# Patient Record
Sex: Female | Born: 1960 | Race: White | Hispanic: No | Marital: Married | Smoking: Current every day smoker
Health system: Southern US, Community
[De-identification: ages and names within clinical notes are randomized; demographics above are authoritative.]

## PROBLEM LIST (undated history)

## (undated) DIAGNOSIS — Z9289 Personal history of other medical treatment: Secondary | ICD-10-CM

## (undated) DIAGNOSIS — F32A Depression, unspecified: Secondary | ICD-10-CM

## (undated) DIAGNOSIS — F1910 Other psychoactive substance abuse, uncomplicated: Secondary | ICD-10-CM

## (undated) DIAGNOSIS — D509 Iron deficiency anemia, unspecified: Secondary | ICD-10-CM

## (undated) DIAGNOSIS — F1020 Alcohol dependence, uncomplicated: Secondary | ICD-10-CM

## (undated) DIAGNOSIS — Z8719 Personal history of other diseases of the digestive system: Secondary | ICD-10-CM

## (undated) DIAGNOSIS — M79606 Pain in leg, unspecified: Secondary | ICD-10-CM

## (undated) DIAGNOSIS — C50919 Malignant neoplasm of unspecified site of unspecified female breast: Secondary | ICD-10-CM

## (undated) DIAGNOSIS — K219 Gastro-esophageal reflux disease without esophagitis: Secondary | ICD-10-CM

## (undated) DIAGNOSIS — D7589 Other specified diseases of blood and blood-forming organs: Secondary | ICD-10-CM

## (undated) DIAGNOSIS — F419 Anxiety disorder, unspecified: Secondary | ICD-10-CM

## (undated) DIAGNOSIS — F329 Major depressive disorder, single episode, unspecified: Secondary | ICD-10-CM

## (undated) DIAGNOSIS — K760 Fatty (change of) liver, not elsewhere classified: Secondary | ICD-10-CM

## (undated) DIAGNOSIS — R51 Headache: Secondary | ICD-10-CM

## (undated) DIAGNOSIS — Z8711 Personal history of peptic ulcer disease: Secondary | ICD-10-CM

## (undated) DIAGNOSIS — M549 Dorsalgia, unspecified: Secondary | ICD-10-CM

## (undated) DIAGNOSIS — R569 Unspecified convulsions: Secondary | ICD-10-CM

## (undated) DIAGNOSIS — R0602 Shortness of breath: Secondary | ICD-10-CM

## (undated) DIAGNOSIS — K7030 Alcoholic cirrhosis of liver without ascites: Secondary | ICD-10-CM

## (undated) DIAGNOSIS — J690 Pneumonitis due to inhalation of food and vomit: Secondary | ICD-10-CM

## (undated) DIAGNOSIS — G8929 Other chronic pain: Secondary | ICD-10-CM

## (undated) DIAGNOSIS — N200 Calculus of kidney: Secondary | ICD-10-CM

## (undated) HISTORY — DX: Malignant neoplasm of unspecified site of unspecified female breast: C50.919

## (undated) HISTORY — DX: Pain in leg, unspecified: M79.606

## (undated) HISTORY — DX: Other chronic pain: G89.29

## (undated) HISTORY — DX: Major depressive disorder, single episode, unspecified: F32.9

## (undated) HISTORY — DX: Other psychoactive substance abuse, uncomplicated: F19.10

## (undated) HISTORY — DX: Depression, unspecified: F32.A

## (undated) HISTORY — PX: ESOPHAGEAL DILATION: SHX303

## (undated) HISTORY — PX: ESOPHAGOGASTRODUODENOSCOPY: SHX1529

## (undated) HISTORY — DX: Dorsalgia, unspecified: M54.9

## (undated) HISTORY — DX: Anxiety disorder, unspecified: F41.9

---

## 1987-08-20 DIAGNOSIS — N200 Calculus of kidney: Secondary | ICD-10-CM

## 1987-08-20 HISTORY — PX: TUBAL LIGATION: SHX77

## 1987-08-20 HISTORY — DX: Calculus of kidney: N20.0

## 2002-01-28 ENCOUNTER — Emergency Department (HOSPITAL_COMMUNITY): Admission: EM | Admit: 2002-01-28 | Discharge: 2002-01-28 | Payer: Self-pay | Admitting: *Deleted

## 2002-04-21 ENCOUNTER — Encounter: Payer: Self-pay | Admitting: Emergency Medicine

## 2002-04-21 ENCOUNTER — Emergency Department (HOSPITAL_COMMUNITY): Admission: EM | Admit: 2002-04-21 | Discharge: 2002-04-21 | Payer: Self-pay | Admitting: Emergency Medicine

## 2002-04-27 ENCOUNTER — Emergency Department (HOSPITAL_COMMUNITY): Admission: EM | Admit: 2002-04-27 | Discharge: 2002-04-27 | Payer: Self-pay | Admitting: Emergency Medicine

## 2002-05-12 ENCOUNTER — Emergency Department (HOSPITAL_COMMUNITY): Admission: EM | Admit: 2002-05-12 | Discharge: 2002-05-12 | Payer: Self-pay | Admitting: Emergency Medicine

## 2002-07-25 ENCOUNTER — Emergency Department (HOSPITAL_COMMUNITY): Admission: EM | Admit: 2002-07-25 | Discharge: 2002-07-26 | Payer: Self-pay | Admitting: Internal Medicine

## 2005-03-02 ENCOUNTER — Emergency Department (HOSPITAL_COMMUNITY): Admission: EM | Admit: 2005-03-02 | Discharge: 2005-03-02 | Payer: Self-pay | Admitting: *Deleted

## 2005-03-20 ENCOUNTER — Ambulatory Visit: Payer: Self-pay | Admitting: Orthopedic Surgery

## 2005-05-09 ENCOUNTER — Ambulatory Visit: Payer: Self-pay | Admitting: Orthopedic Surgery

## 2005-07-01 ENCOUNTER — Ambulatory Visit: Payer: Self-pay | Admitting: Orthopedic Surgery

## 2005-09-18 ENCOUNTER — Emergency Department (HOSPITAL_COMMUNITY): Admission: EM | Admit: 2005-09-18 | Discharge: 2005-09-18 | Payer: Self-pay | Admitting: Emergency Medicine

## 2005-10-03 ENCOUNTER — Inpatient Hospital Stay (HOSPITAL_COMMUNITY): Admission: EM | Admit: 2005-10-03 | Discharge: 2005-10-07 | Payer: Self-pay | Admitting: Emergency Medicine

## 2005-10-04 ENCOUNTER — Ambulatory Visit: Payer: Self-pay | Admitting: Internal Medicine

## 2005-10-04 HISTORY — PX: ESOPHAGOGASTRODUODENOSCOPY (EGD) WITH ESOPHAGEAL DILATION: SHX5812

## 2010-09-08 ENCOUNTER — Encounter: Payer: Self-pay | Admitting: Orthopedic Surgery

## 2010-09-09 ENCOUNTER — Encounter: Payer: Self-pay | Admitting: Internal Medicine

## 2011-01-04 NOTE — Op Note (Signed)
Elizabeth Orr, Elizabeth Orr               ACCOUNT NO.:  [id number]   MEDICAL RECORD NO.:  [id number]          PATIENT TYPE:  INP   LOCATION:  A338                          FACILITY:  [facility]   PHYSICIAN:  R. Roetta Sessions, M.D. DATE OF BIRTH:  04/24/1961   DATE OF PROCEDURE:  10/04/2005  DATE OF DISCHARGE:                                 OPERATIVE REPORT   PROCEDURE:  Esophagogastroduodenoscopy with [name] dilation.   INDICATIONS FOR PROCEDURE:  The patient is a 50 year old Hispanic female  admitted to the hospital with epigastric pain, nausea, vomiting, some  esophageal dysphagia and reflux symptoms. This in the setting of NSAID, ETOH  and cocaine abuse. EGD is now being done. This approach has been discussed  with the patient at length. Potential risks, benefits, and alternatives have  been reviewed and questions answered. She is agreeable. Please see  documentation in the medical record.   PROCEDURE NOTE:  O2 saturation, blood pressure, pulse, and respirations were  monitored throughout the entire procedure. Conscious sedation with Versed 4  mg IV and Demerol 75 mg IV in divided doses. Cetacaine spray for topical  oropharyngeal anesthesia.   INSTRUMENT:  Olympus video chip system.   FINDINGS:  Examination of the tubular esophagus revealed extensive  ulceration of the distal one third of the tubular esophagus in a  geographic distribution. There was some early stricture formation although  the aperture of the esophagus did not appear to be critical at all through  this level. The stricture was an evolution. There was no obvious Barrett's  esophagus or neoplasia. EG junction easily traversed with the scope.   Stomach:  Gastric cavity was empty and insufflated well with air. Thorough  examination of gastric mucosa including retroflexed view of the proximal  stomach and esophagogastric junction revealed multiple prepyloric ulcers, at  least there were four. The largest was 7 mm in  diameter. Ulcers had clean  bases. There was no bleeding stigmata. The remainder of the gastric mucosa  appeared normal. Pylorus patent and easily traversed. Examination of bulb  and second portion revealed a 1 x 1.5 cm bulbar ulcer without bleeding  stigmata. Otherwise, D1 and D2 appeared normal.   THERAPEUTIC/DIAGNOSTIC MANEUVERS:  A 54-F Maloney dilator was passed to full  insertion. A look back revealed the stricture had been dilated without  apparent complication. The patient tolerated the procedure well and was  reactive to endoscopy.   IMPRESSION:  1.  Extensive geographic ulcerations distal third of the tubular esophagus      consistent with severe ulcerative reflux esophagitis, early stricture      formation status post dilation as described above.  2.  Multiple gastric ulcers without bleeding stigmata as described above.      Otherwise normal stomach.  3.  Large bulbar ulcer without bleeding stigmata. Otherwise D1 and D2      appeared normal.   RECOMMENDATIONS:  1.  Agree with b.i.d. protein pump inhibitor therapy. Add Carafate 1 g      slurries q.i.d.  2.  Follow up Helicobacter pylori serologies. Follow up on stool studies.  3.  Avoidance of NSAIDs would be in her best interests.  4.  Further recommendations to follow.   Dr. Cira Servant will be seeing the patient over the weekend.      Jonathon Bellows, M.D.  Electronically Signed     RMR/MEDQ  D:  10/04/2005  T:  10/04/2005  Job:  [id number]

## 2011-01-04 NOTE — Consult Note (Signed)
Elizabeth Orr, Elizabeth Orr               ACCOUNT NO.:  [id number]   MEDICAL RECORD NO.:  [id number]          PATIENT TYPE:  INP   LOCATION:  A338                          FACILITY:  [facility]   PHYSICIAN:  R. Roetta Sessions, M.D. DATE OF BIRTH:  1961/02/04   DATE OF CONSULTATION:  10/04/2005  DATE OF DISCHARGE:                                   CONSULTATION   REFERRING PHYSICIAN:  Margaretmary Dys, M.D.   REASON FOR CONSULTATION:  Abdominal pain.   HISTORY OF PRESENT ILLNESS:  The patient is a 50 year old, English-speaking,  Hispanic female with a 4-week history of intermittent epigastric pain.  She  was evaluated initially on January 31, in the emergency department.  She  states that she had an NG tube placed at that time.  She states that this  seemed to irritate her stomach even more.  She describes going home on  hydrocodone, but nothing for acid reflux or ulcers.  The patient states her  pain has continued to progress.  She complains of severe epigastric pain and  now with 2 days of nausea and vomiting.  She denies any hematemesis.  The  pain is aggravated by meals.  She is drinking only cold water.  She  describes an 8 pound weight loss.  She has had diarrhea x2 days.  She has  noted a small amount of bright red blood in her stool.  She denies any  melena, dysuria or hematuria.  Her period started 3 days ago.  She also  complains of chronic, poorly-controlled acid reflux disease.  She has been  on multiple OTC agents including Mylanta, Pepcid and Zantac most recently.  She complains of dysphagia to solid foods.  She has had near complete food  impactions before.  Generally, she has chronic constipation for which she  takes p.r.n. Milk of Magnesia with good results.  She denies any ill  contacts.  She has a history of significant alcohol abuse.  Generally, she  was drinking at least 80 ounces of beer daily.  She stopped this about 4  weeks when she started feeling bad.  She also smokes  about 1/2 pack of  cigarettes daily and uses crack/cocaine, the most recent time 1 week ago.  Her urine drug screen was positive for benzodiazepines, cocaine and opiates.  Alcohol was less than 5.  The patient complains of fatigue.  She states she  has a lot of stress that drives her to drinking and drug use.  Denies any  prescription drug use, although urine drug screen positive.   MEDICATIONS PRIOR TO ADMISSION:  1.  Motrin 3-4 at a time the last 3 days for abdominal pain.  2.  History of using Goody's powders in the past.  3.  Tylenol p.r.n.  4.  Mylanta p.r.n.  5.  Pepcid p.r.n.  6.  Zantac p.r.n.  7.  Milk of Magnesia p.r.n.   ALLERGIES:  IBUPROFEN has caused facial swelling in the past.   PAST MEDICAL HISTORY:  1.  Polysubstance abuse.  2.  Depression/anxiety.  3.  History of kidney stone requiring stent several  years ago.  4.  She has never had an upper or lower endoscopy.  5.  Bilateral tubal ligation.   FAMILY HISTORY:  Mother has diabetes.  She states that most of her female  relatives including brothers and uncles have either died from cirrhosis or  currently have cirrhosis due to alcohol consumption.  Denies any family  history of colorectal cancer.   SOCIAL HISTORY:  She has a very supportive husband.  They have been married  28 years.  They have seven children.  All are grown and out of the house.  All of her children, but one, moved to [place] a month ago and she states  this has been very stressful.  She uses crack/cocaine on occasion.  The last  time was 1 week ago.  She drinks 80 ounces of beer on a daily basis up until  1 month ago.  She smokes 1/2 pack of cigarettes daily.   REVIEW OF SYSTEMS:  GASTROINTESTINAL:  See HPI.  CONSTITUTIONAL:  See HPI.  GENITOURINARY:  She has regular menses.  They generally last 5-6 days with 2  days being heavy bleeding.  CARDIOPULMONARY:  No chest pain or shortness of  breath.   PHYSICAL EXAMINATION:  VITAL SIGNS:   Temperature 98.5, pulse 83,  respirations 20, blood pressure 90/62, weight 153.6.  GENERAL:  A pleasant, middle-age, Hispanic female in no acute distress.  At  times of the interview, she does moan with pain.  SKIN:  Warm and dry with no jaundice.  HEENT:  Conjunctivae are pale.  Sclerae nonicteric.  Oropharyngeal mucosa  moist and pink.  No lymphadenopathy or thyromegaly.  CHEST:  Lungs clear to auscultation.  CARDIAC:  Regular rate and rhythm with normal S1, S2 with no murmurs, rubs  or gallops.  ABDOMEN:  Positive bowel sounds appear somewhat distended.  No organomegaly  or masses.  Moderate epigastric tenderness to deep palpation.  No rebound  tenderness or guarding.  No abdominal bruits.  No fluid wave or obvious  ascites.  EXTREMITIES:  No edema.  RECTAL:  External hemorrhoids, nonthrombosed and nonbleeding, somewhat  tender.  Stool is yellow and heme positive.   LABORATORY DATA AND X-RAY FINDINGS:  White count 5400 with 8% eos,  hemoglobin 9.5 yesterday, today 8.7, hematocrit 28.5, MCV 71.2, platelets  320,000.  On January 31, her hemoglobin was 10.2.  TSH is 0.359.  Iron 14,  iron saturation 4%, TIBC 333, ferritin 9.  B12 511.  Folate 13.3.  Urine  drug screen positive for benzodiazepines, opiates and cocaine.  Sodium 138,  potassium 3.2, up from 3, BUN 3, creatinine 0.8, glucose 98.  Total  bilirubin 0.3, Alk phos 51, AST 15, ALT 11, albumin 2.6, lipase 15.   CT reveals small to moderate hiatal hernia, small left renal cyst and small  bilateral ovarian cyst.   IMPRESSION:  The patient is a 50 year old, English-speaking, Hispanic female  with a 4-week history of severe epigastric pain now with 2 days of nausea  and vomiting.  She has poorly-controlled acid reflux symptoms, dysphagia to  solid foods.  This is all in the setting of nonsteroidal anti-inflammatory drug and alcohol abuse.  Concerning is microscopic anemia with heme positive  stools.  Would have to consider peptic  ulcer disease, erosive/ulcerative  reflux esophagitis.  She may even have an esophageal web ring or stricture  given her dysphagia.  She has a 2-day history of diarrhea with unclear  etiology.  This is possibly viral.  RECOMMENDATIONS:  1.  EGD this afternoon.  2.  Continue IV Protonix.  3.  K-Dur 40 mEq now.  4.  Stool studies including cultures, C. difficile, O&P and fecal WBC.  5.  CBC and MET 7 in the morning.   I would like to thank Dr. Margaretmary Dys for allowing [place] to take part in  the care of this patient.      Tana Coast, P.AJonathon Bellows, M.D.  Electronically Signed    LL/MEDQ  D:  10/04/2005  T:  10/04/2005  Job:  [id number]

## 2011-01-04 NOTE — Discharge Summary (Signed)
Elizabeth Orr, Elizabeth Orr               ACCOUNT NO.:  [id number]   MEDICAL RECORD NO.:  [id number]          PATIENT TYPE:  INP   LOCATION:  A338                          FACILITY:  [facility]   PHYSICIAN:  Lonia Blood, M.D.       DATE OF BIRTH:  July 18, 1961   DATE OF ADMISSION:  10/03/2005  DATE OF DISCHARGE:  02/19/2007LH                                 DISCHARGE SUMMARY   DISCHARGE DIAGNOSES:  1.  Erosive esophagitis with multiple ulcerations.  2.  Multiple gastric ulcers.  3.  Helicobacter pylori, positive.  4.  Probable nonsteroidal anti-inflammatory drug use.  5.  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  6.  Abdominal pain.  7.  History of polysubstance abuse.  8.  Hypokalemia, resolved.  9.  Dehydration, resolved.   DISCHARGE MEDICATIONS:  1.  Metronidazole 500 mg by mouth three times a day x2 weeks.  2.  Iron tablets by mouth three times a day.  3.  Omeprazole 20 mg by mouth twice a day x2 weeks.  4.  Amoxicillin 500 mg by mouth three times a day x3 weeks.  5.  Oxycodone 5 mg by mouth every 8 hours as needed for severe pain.  6.  Tylenol 500 mg every 6 hours as needed for pain.   CONDITION ON DISCHARGE:  Good.  She was able to tolerate a full diet.   DIET:  Follow soft diet without any sodas or spicy foods.   FOLLOW UP:  She will follow up with Dr. Felecia Shelling, her new primary care  physician, on October 29, 2005, at 10:30 a.m.  She will also follow up with  Dr. Jena Gauss in 3 months for a possible EGD to insure the healing of the  gastric ulcers.   PROCEDURE:  On October 04, 2005, the patient had upper endoscopy which  showed multiple ulcers in the stomach and the esophagus.  The patient was  seen in consultation by the gastroenterology service.  She will be followed  up by Dr. Jena Gauss for repeat endoscopy to insure healing of her gastric  ulcers.   HISTORY OF PRESENT ILLNESS:  For admission History and Physical, please  refer to the dictated H&P done by Dr.  Sherle Poe.   HOSPITAL COURSE:  Problem 1.  ABDOMINAL PAIN:  Elizabeth Orr was admitted with  recurrent abdominal pain.  She was started on intravenous narcotics and  proton pump inhibitor.  She had an endoscopy on October 04, 2005, by the  gastroenterology service and was found to have multiple esophageal and  gastric ulcers.  She also had Helicobacter pylori test that came back  positive.  At this point in time, the plan of care is to use proton pump  inhibitors twice a day, antibiotics for Helicobacter pylori twice a day and  to follow up as an outpatient for assurance of healing of her ulcer.   Problem 2.  ANEMIA MOST LIKELY SECONDARY TO CHRONIC GASTROINTESTINAL LOSS:  The plan is to use oral iron.  At the time of discharge, the patient's  hemoglobin was 8.2 and follow up CBC should  be done at the hospital followup  visit to insure stability.   Problem 3.  HYPOKALEMIA:  Upon admission, the patient was found to be  hypokalemic.  At the time of discharge, her potassium was corrected after  repeat doses and she had no nausea, vomiting or diarrhea at the time of  discharge.      Lonia Blood, M.D.  Electronically Signed     SL/MEDQ  D:  10/07/2005  T:  10/08/2005  Job:  [id number]   cc:   R. Roetta Sessions, M.D.  [address]  [place]  [place] [address]   Tesfaye D. Felecia Shelling, MD  Fax: [phone]

## 2011-01-04 NOTE — H&P (Signed)
Elizabeth Orr, Elizabeth Orr               ACCOUNT NO.:  [id number]   MEDICAL RECORD NO.:  [id number]          PATIENT TYPE:  INP   LOCATION:  A338                          FACILITY:  [facility]   PHYSICIAN:  Margaretmary Dys, M.D.DATE OF BIRTH:  03/29/1961   DATE OF ADMISSION:  10/03/2005  DATE OF DISCHARGE:  LH                                HISTORY & PHYSICAL   PRIMARY CARE PHYSICIAN:  Unassigned.   ADMISSION DIAGNOSIS:  1.  Acute on chronic abdominal/epigastric pain.  2.  Dehydration.  3.  Hypokalemia.  4.  Anemia, microcytic, hypochromic.  5.  Polysubstance abuse.  6.  Probable peptic ulcer disease versus severe gastroesophageal reflux      disease.   CHIEF COMPLAINT:  Epigastric pain of one day duration.   HISTORY OF PRESENT ILLNESS:  Elizabeth Orr is a 50 year old Hispanic female who  presented to the emergency room with complaints of severe epigastric pain.  The patient rated the pain as a 7-8/10 initially but progressed to 10/10.  Was mostly located in the epigastric area.  Was sharp to burning in  sensation and radiated into her back.  The patient reported that she has  been having the symptoms over the past one month on and off and actually in  the elevated on the September 18, 2005 when she was evaluated and discharged  home.  She had some hypokalemia at that time.  The patient reports having  trouble with some of her meals, especially the spicy meals.  She also  reports that when she drinks beer, she has significant pain.  The patient  says she has used Zantac in the past but never had a formal diagnosis of  gastroesophageal reflux disease or peptic ulcer disease.  The patient gives  a vague description of possible endoscopy performed on her two weeks ago but  I did not have the records here.  She said she the procedure was done in the  emergency room.  When I reviewed her records, what I noticed was that she  did have an NG tube placed by did not see any documentation for  endoscopy  studies.   The patient denies any hemoptysis or serious nausea and vomiting.  She  denies any diarrhea.  No melena, stools.  She does not have any frequency,  urgency or dysuria.   The patient states that she uses drugs such as cocaine when she has the  pain.   Initial evaluation in the emergency room revealed some dehydration,  hypokalemia but her labs were unremarkable other than the anemia.   REVIEW OF SYSTEMS:  Ten-point review of systems otherwise negative except as  mentioned in the history of present illness.   PAST MEDICAL HISTORY:  1.  Abdominal pain of one month duration.  2.  History of depression.   MEDICATIONS:  The patient has not taken any medications at this time.   ALLERGIES:  She reports no known drug allergies.  IBUPROFEN occasionally  causes swelling.   FAMILY HISTORY:  Negative for hypertension, diabetes or coronary artery  disease.  No significant illnesses in siblings.  SOCIAL HISTORY:  The patient is married.  Has seven children, all grown up.  She smokes about half a pack of cigarettes a day and has been smoking for  about 30 years.  She also drinks alcohol, about 6-10 ounces a day.  She  drinks mostly beer.  The patient says she had not been drinking for about a  month.  She uses crack cocaine and some prescription drugs.   PHYSICAL EXAMINATION:  GENERAL:  Conscious, alert, comfortable, not in acute  distress, well-oriented to time, place and person.  VITAL SIGNS: Blood pressure 139/84.  Pulse 67.  Respirations 20.  Temperature 97.5.  Oxygen saturation 94% on room air.  HEENT:  Normocephalic, atraumatic.  Oral mucous was dry.  No exudates were  noted.  NECK:  Supple with no JVD or lymphadenopathy.  LUNGS:  Clear clinically with good air entry bilaterally.  HEART:  S1 and S2 regular.  No S3, S4, gallops or rubs.  ABDOMEN:  Slightly distended but soft.  The patient may have some ascites.  She had some tenderness in the epigastric area  which was mild at best.  Bowel sounds were positive.  CHEST:  Grossly intact with no focal deficits.   LABORATORY DATA:  Diagnostic data:  White blood cell count was 5.8,  hemoglobin 9.5, hematocrit 31.4, platelet count 366, MCV 70.4, MCHC 30.3.  Eosinophils were 6%.  ESR 8. Reticulocyte count was 2.3%.  Sodium 135,  potassium 3, chloride 101, CO2 27, glucose 99, BUN 4, creatinine 0.8, total  bilirubin 0.7, alk phos 66, AST 18, ALT 13, total protein 6.4, albumin 3.4,  calcium 8.5, lipase 19.  TSH 0.359.  Iron level 14.  TIBC 333, 4%  saturation.  Vitamin B12 511, ferritin level was 9.  HCG was negative.  Urine toxicology shows positive for benzodiazepines, cocaine and opiates.  Alcohol was undetected.  Urinalysis was unremarkable.  Urine microscopy was  negative.  CT scan of the abdomen and pelvis did not reveal any  abnormalities in the liver.  There was a possibility of a simple  cyst on  the left side.   She also had some hiatal hernia.   ASSESSMENT/PLAN:  Elizabeth Orr is a 50 year old Hispanic female presenting  with acute and chronic epigastric pain.  Possible differential includes  severe gastroesophageal reflux disease versus peptic ulcer disease.  The  patient has had symptoms on and off now for about a month.   PLAN:  Admit her.  Would hydrate her because of her hypokalemia and replace  her K,  control her pain medication and treat her nausea.   She will have a GI consult in the morning to further evaluate for possible  endoscopy.  The patient is not sure and I do not have any records of a  questionable endoscopy performed on her in our recent visit about two weeks.  Will check lipase in the morning.  Her symptoms, laboratory studies and  physical examination do not suggest pancreatitis or acute hepatitis of viral  origin.  Will continue to follow her LFTs.  Her anemia is fairly concerning.  It may be related to possible peptic ulcer  disease or occult GI loss.  She does  have evidence of iron-deficiency  anemia.  Prior to starting her on iron, I recommend that she get stool for  occult blood x3 and endoscopy.   Code status.  She is a full code.   Discussed the plan with the patient and her husband and also  advised her on  quitting alcohol, cigarettes and also  polysubstance abuse including cocaine  and marijuana.      Margaretmary Dys, M.D.  Electronically Signed     AM/MEDQ  D:  10/03/2005  T:  10/04/2005  Job:  [id number]

## 2011-01-04 NOTE — Group Therapy Note (Signed)
Elizabeth Orr, Elizabeth Orr               ACCOUNT NO.:  [id number]   MEDICAL RECORD NO.:  [id number]          PATIENT TYPE:  INP   LOCATION:  A338                          FACILITY:  [facility]   PHYSICIAN:  Margaretmary Dys, M.D.DATE OF BIRTH:  11-Sep-1960   DATE OF PROCEDURE:  10/06/2005  DATE OF DISCHARGE:                                   PROGRESS NOTE   PROGRESS NOTE:   SUBJECTIVE:  Patient has had some epigastric pain but feels better.  She  denies any nausea or vomiting.  She is better able to tolerate her fluids  now.  She has no fever or chills.  She denies any bright red blood in her  stools.   OBJECTIVE:  GENERAL:  Patient is alert comfortable in no acute distress.  VITAL SIGNS:  Blood pressure 127/67, pulse 87, respirations 20, Temperature  Max was 99.1 degrees Fahrenheit.  HEENT:  Normocephalic, atraumatic.  Oral mucosa is moist. No exudates.  NECK:  Supple.  No JVD or lymphadenopathy.  LUNGS:  Clear.  Good air movement bilaterally.  HEART:  S1, S2 regular. No murmurs, gallops or rubs.  ABDOMEN:  Soft, nontender, bowel sounds positive.  No masses palpable.  EXTREMITIES:  No edema was noted.  NEUROLOGICAL:  Grossly intact with no focal deficits.   LABORATORY DATA:  Hemoglobin was down to 8.0, hematocrit 26.6, MCV 7.16.  Sodium 137, potassium 3.6, chloride 103, CO2 31, BUN 4, creatinine 0.8,  glucose 106.  Calcium is 8.5.   ASSESSMENT:  1.  Peptic ulcer disease diagnosed by endoscopy performed September 03, 2005.      Patient is currently on proton pump inhibitor and it is recommended that      she will be on this for about 8 to 12 weeks b.i.d.  Patient was advised      to abstain from using nonsteroidals agents and also alcohol and cocaine      use.  2.  Anemia.  H&H is down today. I am a little concerned.  Patient may      require transfusion prior to discharge.  She may still be having occult      blood GI.   I will type and cross and match 2 units of packed red blood cells  and if her  hemoglobin is less than 8 tomorrow, I will transfuse her with two units of  packed red cells.   DISPOSITION:  I suspect that she will be discharged in the next 1-2 days  once we achieve some stability in her H&H.  Wean her off IV fluids at this  time.  Increase p.o. intake as she  improves.      Margaretmary Dys, M.D.  Electronically Signed     AM/MEDQ  D:  10/06/2005  T:  10/06/2005  Job:  [id number]

## 2012-04-19 HISTORY — PX: BREAST BIOPSY: SHX20

## 2012-05-07 ENCOUNTER — Other Ambulatory Visit (HOSPITAL_COMMUNITY): Payer: Self-pay | Admitting: *Deleted

## 2012-05-13 ENCOUNTER — Encounter (HOSPITAL_COMMUNITY): Payer: Self-pay

## 2012-05-20 ENCOUNTER — Ambulatory Visit (HOSPITAL_COMMUNITY)
Admission: RE | Admit: 2012-05-20 | Discharge: 2012-05-20 | Disposition: A | Discharge status: Home / Self Care | Payer: Medicaid Other | Source: Ambulatory Visit | Attending: *Deleted | Admitting: *Deleted

## 2012-05-20 ENCOUNTER — Other Ambulatory Visit (HOSPITAL_COMMUNITY): Payer: Self-pay | Admitting: *Deleted

## 2012-05-20 DIAGNOSIS — N644 Mastodynia: Secondary | ICD-10-CM | POA: Insufficient documentation

## 2012-05-20 DIAGNOSIS — N63 Unspecified lump in unspecified breast: Secondary | ICD-10-CM | POA: Insufficient documentation

## 2012-05-26 ENCOUNTER — Encounter (HOSPITAL_COMMUNITY): Payer: Self-pay | Admitting: Pharmacy Technician

## 2012-05-28 ENCOUNTER — Encounter (HOSPITAL_COMMUNITY): Payer: Self-pay

## 2012-05-28 ENCOUNTER — Encounter (HOSPITAL_COMMUNITY)
Admission: RE | Admit: 2012-05-28 | Discharge: 2012-05-28 | Payer: Medicaid Other | Source: Ambulatory Visit | Attending: General Surgery | Admitting: General Surgery

## 2012-05-28 HISTORY — DX: Calculus of kidney: N20.0

## 2012-05-28 HISTORY — DX: Personal history of other diseases of the digestive system: Z87.19

## 2012-05-28 HISTORY — DX: Personal history of peptic ulcer disease: Z87.11

## 2012-05-28 HISTORY — DX: Gastro-esophageal reflux disease without esophagitis: K21.9

## 2012-05-28 LAB — CBC WITH DIFFERENTIAL/PLATELET
Eosinophils Absolute: 0.5 10*3/uL (ref 0.0–0.7)
HCT: 33.5 % — ABNORMAL LOW (ref 36.0–46.0)
Lymphocytes Relative: 20 % (ref 12–46)
Lymphs Abs: 2.1 10*3/uL (ref 0.7–4.0)
MCH: 30.4 pg (ref 26.0–34.0)
MCV: 92.5 fL (ref 78.0–100.0)
Monocytes Absolute: 0.6 10*3/uL (ref 0.1–1.0)
Monocytes Relative: 6 % (ref 3–12)
Platelets: 306 10*3/uL (ref 150–400)
RBC: 3.62 MIL/uL — ABNORMAL LOW (ref 3.87–5.11)
WBC: 10.7 10*3/uL — ABNORMAL HIGH (ref 4.0–10.5)

## 2012-05-28 LAB — BASIC METABOLIC PANEL
Chloride: 100 mEq/L (ref 96–112)
GFR calc non Af Amer: >90 mL/min (ref >90)
Sodium: 137 mEq/L (ref 135–145)

## 2012-05-28 LAB — SURGICAL PCR SCREEN
MRSA, PCR: NEGATIVE
Staphylococcus aureus: NEGATIVE

## 2012-05-28 NOTE — Patient Instructions (Addendum)
22 Elizabeth Orr  05/28/2012   Your procedure is scheduled on:  Monday, October 14  Report to [facility] at 0730 AM.  Call this number if you have problems the morning of surgery: [phone]   Remember:   Do not eat food:After Midnight.  May have clear liquids:until Midnight .  Clear liquids include soda, tea, black coffee, apple or grape juice, broth.  Take these medicines the morning of surgery with A SIP OF WATER:    Do not wear jewelry, make-up or nail polish.  Do not wear lotions, powders, or perfumes. You may wear deodorant.  Do not shave 48 hours prior to surgery. Men may shave face and neck.  Do not bring valuables to the hospital.  Contacts, dentures or bridgework may not be worn into surgery.  Leave suitcase in the car. After surgery it may be brought to your room.  For patients admitted to the hospital, checkout time is 11:00 AM the day of discharge.   Patients discharged the day of surgery will not be allowed to drive home.  Name and phone number of your driver: Family and/or friend  Special Instructions: Shower using CHG 2 nights before surgery and the night before surgery.  If you shower the day of surgery use CHG.  Use special wash - you have one bottle of CHG for all showers.  You should use approximately 1/3 of the bottle for each shower.   Please read over the following fact sheets that you were given: Pain Booklet, Coughing and Deep Breathing, MRSA Information, Surgical Site Infection Prevention, Anesthesia Post-op Instructions and Care and Recovery After Surgery  PATIENT INSTRUCTIONS POST-ANESTHESIA  IMMEDIATELY FOLLOWING SURGERY:  Do not drive or operate machinery for the first twenty four hours after surgery.  Do not make any important decisions for twenty four hours after surgery or while taking narcotic pain medications or sedatives.  If you develop intractable nausea and vomiting or a severe headache please notify your doctor immediately.  FOLLOW-UP:  Please  make an appointment with your surgeon as instructed. You do not need to follow up with anesthesia unless specifically instructed to do so.  WOUND CARE INSTRUCTIONS (if applicable):  Keep a dry clean dressing on the anesthesia/puncture wound site if there is drainage.  Once the wound has quit draining you may leave it open to air.  Generally you should leave the bandage intact for twenty four hours unless there is drainage.  If the epidural site drains for more than 36-48 hours please call the anesthesia department.  QUESTIONS?:  Please feel free to call your physician or the hospital operator if you have any questions, and they will be happy to assist you.     Breast Biopsy A breast biopsy is a procedure where a sample of breast tissue is removed from your breast. The tissue is examined under a microscope to see if cancerous cells are present. A breast biopsy is done when there is:  Any undiagnosed breast mass (tumor).  Nipple abnormalities, dimpling, crusting, or ulcerations.  Abnormal discharge from the nipple, especially blood.  Redness, swelling, and pain of the breast.  Calcium deposits (calcifications) or abnormalities seen on a mammogram, ultrasound result, or results of magnetic resonance imaging (MRI).  Suspicious changes in the breast seen on your mammogram. If the tumor is found to be cancerous (malignant), a breast biopsy can help to determine what the best treatment is for you. There are many different types of breast biopsies. Talk to your  caregiver about your options and which type is best for you. LET YOUR CAREGIVER KNOW ABOUT:  Allergies to food or medicine.  Medicines taken, including vitamins, herbs, eyedrops, over-the-counter medicines, and creams.  Use of steroids (by mouth or creams).  Previous problems with anesthetics or numbing medicines.  History of bleeding problems or blood clots.  Previous surgery.  Other health problems, including diabetes and kidney  problems.  Any recent colds or infections.  Possibility of pregnancy, if this applies. RISKS AND COMPLICATIONS   Bleeding.  Infection.  Allergy to medicines.  Bruising and swelling of the breast.  Alteration in the shape of the breast.  Not finding the lump or abnormality.  Needing more surgery. BEFORE THE PROCEDURE  Arrange for someone to drive you home after the procedure.  Do not smoke for 2 weeks before the procedure. Stop smoking, if you smoke.  Do not drink alcohol for 24 hours before procedure.  Wear a good support bra to the procedure. PROCEDURE  You may be given a medicine to numb the breast area (local anesthesia) or a medicine to make you sleep (general anesthesia) during the procedure. The following are the different types of biopsies that can be performed.   Fine-needle aspiration A thin needle is attached to a syringe and inserted into the breast lump. Fluid and cells are removed and then looked at under a microscope. If the breast lump cannot be felt, an ultrasound may be used to help locate the lump and place the needle in the correct area.   Core needle biopsy A wide, hollow needle (core needle) is inserted into the breast lump 3 6 times to get tissue samples or cores. The samples are removed. The needle is usually placed in the correct area by using an ultrasound or X-ray.   Stereotactic biopsy X-ray equipment and a computer are used to analyze X-ray pictures of the breast lump. The computer then finds exactly where the core needle needs to be inserted. Tissue samples are removed.   Vacuum-assisted biopsy A small incision (less than  inch) is made in your breast. A biopsy device that includes a hollow needle and vacuum is passed through the incision and into the breast tissue. The vacuum gently draws abnormal breast tissue into the needle to remove it. This type of biopsy removes a larger tissue sample than a regular core needle biopsy. No stitches are  needed, and there is usually little scarring.  Ultrasound-guided core needle biopsy A high frequency ultrasound helps guide the core needle to the area of the mass or abnormality. An incision is made to insert the needle. Tissue samples are removed.  Open biopsy A larger incision is made in the breast. Your caregiver will attempt to remove the whole breast lump or as much as possible. AFTER THE PROCEDURE  You will be taken to the recovery area. If you are doing well and have no problems, you will be allowed to go home.  You may notice bruising on your breast. This is normal.  Your caregiver may apply a pressure dressing on your breast for 24 48 hours. A pressure dressing is a bandage that is wrapped tightly around the chest to stop fluid from collecting underneath tissues.

## 2012-06-01 ENCOUNTER — Encounter (HOSPITAL_COMMUNITY): Payer: Self-pay | Admitting: Anesthesiology

## 2012-06-01 ENCOUNTER — Encounter (HOSPITAL_COMMUNITY)
Admission: RE | Disposition: A | Discharge status: Home / Self Care | Payer: Self-pay | Source: Ambulatory Visit | Attending: General Surgery

## 2012-06-01 ENCOUNTER — Ambulatory Visit (HOSPITAL_COMMUNITY)
Admission: RE | Admit: 2012-06-01 | Discharge: 2012-06-01 | Disposition: A | Discharge status: Home / Self Care | Payer: Medicaid Other | Source: Ambulatory Visit | Attending: General Surgery | Admitting: General Surgery

## 2012-06-01 ENCOUNTER — Ambulatory Visit (HOSPITAL_COMMUNITY): Payer: Medicaid Other | Admitting: Anesthesiology

## 2012-06-01 DIAGNOSIS — C50919 Malignant neoplasm of unspecified site of unspecified female breast: Secondary | ICD-10-CM | POA: Insufficient documentation

## 2012-06-01 DIAGNOSIS — N6310 Unspecified lump in the right breast, unspecified quadrant: Secondary | ICD-10-CM

## 2012-06-01 SURGERY — EXCISION OF BREAST BIOPSY
Anesthesia: General | Laterality: Right | Wound class: Clean

## 2012-06-01 MED ORDER — BUPIVACAINE HCL (PF) 0.5 % IJ SOLN
INTRAMUSCULAR | Status: AC
  Filled 2012-06-01: qty 30

## 2012-06-01 MED ORDER — ONDANSETRON HCL 4 MG/2ML IJ SOLN
4.0000 mg | Freq: Once | INTRAMUSCULAR | Status: AC
  Administered 2012-06-01: 4 mg via INTRAVENOUS

## 2012-06-01 MED ORDER — FENTANYL CITRATE 0.05 MG/ML IJ SOLN
INTRAMUSCULAR | Status: AC
  Filled 2012-06-01: qty 2

## 2012-06-01 MED ORDER — MIDAZOLAM HCL 2 MG/2ML IJ SOLN
1.0000 mg | INTRAMUSCULAR | Status: AC | PRN
  Administered 2012-06-01 (×3): 2 mg via INTRAVENOUS

## 2012-06-01 MED ORDER — ROCURONIUM BROMIDE 50 MG/5ML IV SOLN
INTRAVENOUS | Status: AC
  Filled 2012-06-01: qty 1

## 2012-06-01 MED ORDER — SUCCINYLCHOLINE CHLORIDE 20 MG/ML IJ SOLN
INTRAMUSCULAR | Status: AC
  Filled 2012-06-01: qty 1

## 2012-06-01 MED ORDER — FENTANYL CITRATE 0.05 MG/ML IJ SOLN: 50.0000 ug | Freq: Once | INTRAMUSCULAR | Status: DC

## 2012-06-01 MED ORDER — SUCCINYLCHOLINE CHLORIDE 20 MG/ML IJ SOLN
INTRAMUSCULAR | Status: DC | PRN
  Administered 2012-06-01: 120 mg via INTRAVENOUS

## 2012-06-01 MED ORDER — PROPOFOL 10 MG/ML IV BOLUS
INTRAVENOUS | Status: DC | PRN
  Administered 2012-06-01: 150 mg via INTRAVENOUS

## 2012-06-01 MED ORDER — GLYCOPYRROLATE 0.2 MG/ML IJ SOLN
INTRAMUSCULAR | Status: AC
  Filled 2012-06-01: qty 1

## 2012-06-01 MED ORDER — CEFAZOLIN SODIUM-DEXTROSE 2-3 GM-% IV SOLR
INTRAVENOUS | Status: AC
  Filled 2012-06-01: qty 50

## 2012-06-01 MED ORDER — HYDROCODONE-ACETAMINOPHEN 5-325 MG PO TABS: 1.0000 | ORAL_TABLET | ORAL | Status: DC | PRN

## 2012-06-01 MED ORDER — CEFAZOLIN SODIUM-DEXTROSE 2-3 GM-% IV SOLR
INTRAVENOUS | Status: DC | PRN
  Administered 2012-06-01: 2 g via INTRAVENOUS

## 2012-06-01 MED ORDER — ALBUTEROL SULFATE (5 MG/ML) 0.5% IN NEBU
2.5000 mg | INHALATION_SOLUTION | Freq: Once | RESPIRATORY_TRACT | Status: AC
  Administered 2012-06-01: 2.5 mg via RESPIRATORY_TRACT

## 2012-06-01 MED ORDER — MIDAZOLAM HCL 2 MG/2ML IJ SOLN
INTRAMUSCULAR | Status: AC
  Filled 2012-06-01: qty 2

## 2012-06-01 MED ORDER — LIDOCAINE HCL (PF) 1 % IJ SOLN
INTRAMUSCULAR | Status: AC
  Filled 2012-06-01: qty 5

## 2012-06-01 MED ORDER — PROPOFOL 10 MG/ML IV EMUL
INTRAVENOUS | Status: AC
  Filled 2012-06-01: qty 20

## 2012-06-01 MED ORDER — LIDOCAINE HCL 1 % IJ SOLN
INTRAMUSCULAR | Status: DC | PRN
  Administered 2012-06-01: 50 mg via INTRADERMAL

## 2012-06-01 MED ORDER — LIDOCAINE HCL (PF) 1 % IJ SOLN
INTRAMUSCULAR | Status: AC
  Filled 2012-06-01: qty 30

## 2012-06-01 MED ORDER — SODIUM CHLORIDE 0.9 % IN NEBU
INHALATION_SOLUTION | RESPIRATORY_TRACT | Status: AC
  Filled 2012-06-01: qty 3

## 2012-06-01 MED ORDER — GLYCOPYRROLATE 0.2 MG/ML IJ SOLN
0.2000 mg | Freq: Once | INTRAMUSCULAR | Status: AC
  Administered 2012-06-01: 0.2 mg via INTRAVENOUS

## 2012-06-01 MED ORDER — LACTATED RINGERS IV SOLN
INTRAVENOUS | Status: DC | PRN
  Administered 2012-06-01: 09:00:00 via INTRAVENOUS

## 2012-06-01 MED ORDER — FENTANYL CITRATE 0.05 MG/ML IJ SOLN
INTRAMUSCULAR | Status: DC | PRN
  Administered 2012-06-01 (×5): 50 ug via INTRAVENOUS
  Administered 2012-06-01: 100 ug via INTRAVENOUS

## 2012-06-01 MED ORDER — CEFAZOLIN SODIUM-DEXTROSE 2-3 GM-% IV SOLR: 2.0000 g | INTRAVENOUS | Status: DC

## 2012-06-01 MED ORDER — ONDANSETRON HCL 4 MG/2ML IJ SOLN: 4.0000 mg | Freq: Once | INTRAMUSCULAR | Status: DC | PRN

## 2012-06-01 MED ORDER — ONDANSETRON HCL 4 MG/2ML IJ SOLN
INTRAMUSCULAR | Status: AC
  Filled 2012-06-01: qty 2

## 2012-06-01 MED ORDER — FENTANYL CITRATE 0.05 MG/ML IJ SOLN
25.0000 ug | INTRAMUSCULAR | Status: DC | PRN
  Administered 2012-06-01: 25 ug via INTRAVENOUS
  Administered 2012-06-01 (×2): 50 ug via INTRAVENOUS
  Administered 2012-06-01: 25 ug via INTRAVENOUS

## 2012-06-01 MED ORDER — LACTATED RINGERS IV SOLN
INTRAVENOUS | Status: DC
  Administered 2012-06-01: 09:00:00 via INTRAVENOUS

## 2012-06-01 MED ORDER — ALBUTEROL SULFATE (5 MG/ML) 0.5% IN NEBU
INHALATION_SOLUTION | RESPIRATORY_TRACT | Status: AC
  Filled 2012-06-01: qty 0.5

## 2012-06-01 SURGICAL SUPPLY — 34 items
APL SKNCLS STERI-STRIP NONHPOA (GAUZE/BANDAGES/DRESSINGS) ×2
BAG HAMPER (MISCELLANEOUS) ×1 IMPLANT
BENZOIN TINCTURE PRP APPL 2/3 (GAUZE/BANDAGES/DRESSINGS) ×2 IMPLANT
CLOTH BEACON ORANGE TIMEOUT ST (SAFETY) ×1 IMPLANT
COVER LIGHT HANDLE STERIS (MISCELLANEOUS) ×2 IMPLANT
ELECT REM PT RETURN 9FT ADLT (ELECTROSURGICAL) ×2
ELECTRODE REM PT RTRN 9FT ADLT (ELECTROSURGICAL) IMPLANT
FORMALIN 10 PREFIL 120ML (MISCELLANEOUS) ×1 IMPLANT
GLOVE BIOGEL PI IND STRL 7.0 (GLOVE) IMPLANT
GLOVE BIOGEL PI IND STRL 7.5 (GLOVE) IMPLANT
GLOVE BIOGEL PI INDICATOR 7.0 (GLOVE) ×1
GLOVE BIOGEL PI INDICATOR 7.5 (GLOVE) ×1
GLOVE ECLIPSE 6.5 STRL STRAW (GLOVE) ×1 IMPLANT
GLOVE ECLIPSE 7.0 STRL STRAW (GLOVE) ×1 IMPLANT
GOWN STRL REIN XL XLG (GOWN DISPOSABLE) ×2 IMPLANT
GRASPER ENDO ROTC 5X31 (INSTRUMENTS) ×1 IMPLANT
KIT ROOM TURNOVER APOR (KITS) ×1 IMPLANT
MANIFOLD NEPTUNE II (INSTRUMENTS) ×1 IMPLANT
NDL HYPO 18GX1.5 BLUNT FILL (NEEDLE) IMPLANT
NDL HYPO 25X1 1.5 SAFETY (NEEDLE) IMPLANT
NEEDLE HYPO 18GX1.5 BLUNT FILL (NEEDLE) IMPLANT
NEEDLE HYPO 25X1 1.5 SAFETY (NEEDLE) ×2 IMPLANT
NS IRRIG 1000ML POUR BTL (IV SOLUTION) ×1 IMPLANT
PACK MINOR (CUSTOM PROCEDURE TRAY) ×1 IMPLANT
PAD ARMBOARD 7.5X6 YLW CONV (MISCELLANEOUS) ×1 IMPLANT
SET BASIN LINEN APH (SET/KITS/TRAYS/PACK) ×1 IMPLANT
SPONGE GAUZE 4X4 12PLY (GAUZE/BANDAGES/DRESSINGS) ×2 IMPLANT
STRIP CLOSURE SKIN 1/2X4 (GAUZE/BANDAGES/DRESSINGS) ×2 IMPLANT
SUT MNCRL AB 4-0 PS2 18 (SUTURE) ×1 IMPLANT
SUT VIC AB 3-0 SH 27 (SUTURE) ×2
SUT VIC AB 3-0 SH 27X BRD (SUTURE) IMPLANT
SYR BULB IRRIGATION 50ML (SYRINGE) ×1 IMPLANT
SYR CONTROL 10ML LL (SYRINGE) ×1 IMPLANT
TAPE CLOTH SURG 4X10 WHT LF (GAUZE/BANDAGES/DRESSINGS) ×1 IMPLANT

## 2012-06-01 NOTE — Op Note (Signed)
Patient:  Elizabeth Orr  DOB:  1961/02/16  MRN:  [id number]   Preop Diagnosis:  Right Breast Mass  Postop Diagnosis:  The same  Procedure:  Right excisional breast biopsy  Surgeon:  Dr. Tilford Pillar  Anes:  General endotracheal, 0.5% Sensorcaine plain for local  Indications:  Patient is a 51 year old female who is in to my office after evaluation for a right breast mass was suspicious on a mammogram. Options for biopsy were discussed. Patient understood risk if it's alternatives of ultrasound-guided biopsy versus surgical excisional biopsy versus stereotactic biopsy. She did wish to proceed with an excisional biopsy. She understood the risks benefits and alternatives including but not limited to risk of bleeding, infection, skin dehiscence, and the likely need for additional operations. Patient was consented for the planned procedure.  Procedure note:  Patient was taken to the or was placed in a supine position the or table time the general anesthetic is a Optician, dispensing. Once patient was asleep she was endotracheally intubated by the nurse anesthetist. At this point her right breast and chest wall prepped with DuraPrep solution draped in standard fashion. A periareolar incision was created with a 15 blade scalpel with additional dissection down to subcuticular tissue carried out using electrocautery. Masses easily palpated and I dissected down and circumferentially around the mass with electrocautery. Hemostasis was obtained using electrocautery. Once the mass was completely dissected free circumferentially was sent as a perm specimen to pathology. The wound is irrigated with copious amount of sterile saline. Hemostasis was excellent. At this time I turned my attention to closure.  The deep subcuticular tissue was reapproximated using a 3-0 Vicryl in simple interrupted fashion. The skin edges reapproximated using a running subcuticular suture. The skin was washed dried moist dry towel after the local  anesthetic was instilled. Benzoin is applied around incision. Half-inch are suture placed. The drapes removed patient left come out of general anesthetic was transferred to PACU in stable condition. At the conclusion of the procedure all instrument, sponge, needle counts are correct. Patient tolerated procedure extremely well.  Complications:  None apparent  EBL:  Minimal  Specimen:  Right breast mass

## 2012-06-01 NOTE — Anesthesia Procedure Notes (Signed)
Procedure Name: Intubation Date/Time: 06/01/2012 10:23 AM Performed by: Carolyne Littles, AMY L Pre-anesthesia Checklist: Patient identified, Patient being monitored, Timeout performed, Emergency Drugs available and Suction available Patient Re-evaluated:Patient Re-evaluated prior to inductionOxygen Delivery Method: Circle System Utilized Preoxygenation: Pre-oxygenation with 100% oxygen Intubation Type: IV induction, Rapid sequence and Cricoid Pressure applied Laryngoscope Size: 3 and Miller Grade View: Grade I Tube type: Oral Tube size: 7.0 mm Number of attempts: 1 Airway Equipment and Method: stylet Placement Confirmation: ETT inserted through vocal cords under direct vision,  positive ETCO2 and breath sounds checked- equal and bilateral Secured at: 21 cm Tube secured with: Tape Dental Injury: Teeth and Oropharynx as per pre-operative assessment

## 2012-06-01 NOTE — Interval H&P Note (Signed)
History and Physical Interval Note:  06/01/2012 10:08 AM  Elizabeth Orr  has presented today for surgery, with the diagnosis of Breast Mass  The various methods of treatment have been discussed with the patient and family. After consideration of risks, benefits and other options for treatment, the patient has consented to  Procedure(s) (LRB) with comments: EXCISION OF BREAST BIOPSY (Right) as a surgical intervention .  The patient's history has been reviewed, patient examined, no change in status, stable for surgery.  I have reviewed the patient's chart and labs.  Questions were answered to the patient's satisfaction.     Allura Doepke C

## 2012-06-01 NOTE — H&P (Signed)
NTS SOAP Note  Vital Signs:  Vitals as of: 05/26/2012: Systolic 160: Diastolic 85: Heart Rate 117: Temp 98.34F: Height 35ft 2in: Weight 232Lbs 0 Ounces: Pain Level 9: BMI 42  BMI : 42.43 kg/m2  Subjective: This 51 Years 62 Months old Female presents for of mass in the right breast. Patient noted changes on self breast exam. She was evaluated at the health department and was sent for mammogram and ultrasound. These changes, mammogram patient was referred for surgical evaluation. Patient states she's noted is increase in the size of the mass of the last several months. She didn't denies any skin changes. No erythema. No nipple changes. No nipple discharge. She denies any significant fevers or chills. She has been extremely anxious. She denies any family history. No significant weight changes. No similar lesions in the past.  Review of Symptoms:    anxious    headaches Eyes:unremarkable   Nose/Mouth/Throat:unremarkable Cardiovascular:  unremarkable   Respiratory:unremarkable   Gastrointestinal:  unremarkable   Genitourinary:unremarkable       arthralgias Skin:unremarkable   as per history of present illness Hematolgic/Lymphatic:unremarkable     Allergic/Immunologic:unremarkable     Past Medical History:  Obtained     Past Medical History  Surgical History: tubal ligation Medical Problems: none Psychiatric History: none Allergies: ibuprofen Medications: none   Social History:Obtained  Social History  Preferred Language: Spanish ([place]) Race:  Native Hawaiian or Other Pacific Islander Ethnicity: Not Hispanic / Latino Age: 51 Years 9 Months Marital Status:  M Alcohol: none Recreational drug(s): none   Smoking Status: Never smoker reviewed on 06/01/2012  Family History:Obtained     Family History  Is there a family history [id number]. No family history of breast cancer, ovarian, or uterine cancers. No prostate  cancer history.    Objective Information: General:  Well appearing, well nourished in no distress. anxious Skin:     no rash or prominent lesions Head:Atraumatic; no masses; no abnormalities Eyes:  conjunctiva clear, EOM intact, PERRL Mouth:  Mucous membranes moist, no mucosal lesions. Neck:  Supple without lymphadenopathy.  Heart:  RRR, no murmur Lungs:    CTA bilaterally, no wheezes, rhonchi, rales.  Breathing unlabored.       palpable large mass within the right breast at approximately 12:00 position. Also a small are noted lateral lesionIn the right breast. No nipple retraction. No skin dimpling.  no axillary lymphadenopathy. Abdomen:Soft, NT/ND, no HSM, no masses. Extremities:  No deformities, clubbing, cyanosis, or edema.    mammogram/ultrasound: abnormal mass within the right breast x3. 2 these are in close approximation based on mammogram and ultrasound findings. The third is lateral. Given the findings patient was given a BI-RADS score of 5   Assessment:    Plan: right breast mass. At this time options were discussed at length the patient and family. Less invasive techniques such as stereotactic biopsy or ultrasound-guided biopsy were discussed with with the patient. Patient is adamant that she wishes to be asleep during any procedure.she understands the increase in dizziness of a surgical biopsy but does wish to proceed. She understands the risk of bleeding, infection, as well as the likely need for additional surgeries. She understands as does the family that this is to obtain additional information and is not a definitive therapy should this truly be identified as a this will be scheduled at her earliest convenience as soon as possible.  Patient Education:Alternative treatments to surgery were discussed with patient (and family).  Risks and benefits  of procedure were  fully explained to the patient (and family) who gave informed consent.  Patient/family questions were addressed.  Follow-up:Pending Surgery

## 2012-06-01 NOTE — Transfer of Care (Signed)
  Anesthesia Post-op Note  Patient: Elizabeth Orr  Procedure(s) Performed: Procedure(s) (LRB) with comments: EXCISION OF BREAST BIOPSY (Right)  Patient Location: PACU  Anesthesia Type: General  Level of Consciousness: awake, alert , oriented and patient cooperative  Airway and Oxygen Therapy: Patient Spontanous Breathing and Patient connected to face mask oxygen  Post-op Pain: mild  Post-op Assessment: Post-op Vital signs reviewed, Patient's Cardiovascular Status Stable, Respiratory Function Stable, Patent Airway and No signs of Nausea or vomiting  Post-op Vital Signs: Reviewed and stable  Complications: No apparent anesthesia complications

## 2012-06-01 NOTE — Preoperative (Signed)
Beta Blockers   Reason not to administer Beta Blockers:Not Applicable 

## 2012-06-01 NOTE — Anesthesia Postprocedure Evaluation (Signed)
  Anesthesia Post-op Note  Patient: Elizabeth Orr  Procedure(s) Performed: Procedure(s) (LRB) with comments: EXCISION OF BREAST BIOPSY (Right)  Patient Location: PACU  Anesthesia Type: General  Level of Consciousness: awake, alert , oriented and patient cooperative  Airway and Oxygen Therapy: Patient Spontanous Breathing and Patient connected to face mask oxygen  Post-op Pain: mild  Post-op Assessment: Post-op Vital signs reviewed, Patient's Cardiovascular Status Stable, Respiratory Function Stable, Patent Airway and No signs of Nausea or vomiting  Post-op Vital Signs: Reviewed and stable  Complications: No apparent anesthesia complications  

## 2012-06-01 NOTE — Anesthesia Preprocedure Evaluation (Signed)
Anesthesia Evaluation  Patient identified by MRN, date of birth, ID band Patient awake    Reviewed: Allergy & Precautions, H&P , NPO status , Patient's Chart, lab work & pertinent test results  History of Anesthesia Complications Negative for: history of anesthetic complications  Airway Mallampati: II      Dental  (+) Poor Dentition, Chipped and Missing   Pulmonary shortness of breath,  Sever GERD probable silent aspiration at night...chronic cough    + wheezing      Cardiovascular negative cardio ROS  Rhythm:Regular Rate:Normal     Neuro/Psych    GI/Hepatic GERD-  Poorly Controlled,  Endo/Other    Renal/GU Renal disease (stones)     Musculoskeletal   Abdominal   Peds  Hematology   Anesthesia Other Findings   Reproductive/Obstetrics                           Anesthesia Physical Anesthesia Plan  ASA: III  Anesthesia Plan: General   Post-op Pain Management:    Induction: Intravenous, Rapid sequence and Cricoid pressure planned  Airway Management Planned: Oral ETT  Additional Equipment:   Intra-op Plan:   Post-operative Plan: Extubation in OR  Informed Consent: I have reviewed the patients History and Physical, chart, labs and discussed the procedure including the risks, benefits and alternatives for the proposed anesthesia with the patient or authorized representative who has indicated his/her understanding and acceptance.     Plan Discussed with:   Anesthesia Plan Comments: (preop albuterol jet neb.)        Anesthesia Quick Evaluation

## 2012-06-02 MED ORDER — 0.9 % SODIUM CHLORIDE (POUR BTL) OPTIME
TOPICAL | Status: AC | PRN
  Administered 2012-06-02: 1000 mL

## 2012-06-02 MED ORDER — BUPIVACAINE HCL (PF) 0.5 % IJ SOLN
INTRAMUSCULAR | Status: AC | PRN
  Administered 2012-06-02: 10 mL

## 2012-06-04 ENCOUNTER — Other Ambulatory Visit (HOSPITAL_COMMUNITY): Payer: Self-pay | Admitting: General Surgery

## 2012-06-04 DIAGNOSIS — C50919 Malignant neoplasm of unspecified site of unspecified female breast: Secondary | ICD-10-CM

## 2012-06-05 ENCOUNTER — Encounter (HOSPITAL_COMMUNITY)
Admission: RE | Admit: 2012-06-05 | Discharge: 2012-06-05 | Disposition: A | Discharge status: Home / Self Care | Payer: Medicaid Other | Source: Ambulatory Visit | Attending: General Surgery | Admitting: General Surgery

## 2012-06-05 ENCOUNTER — Encounter (HOSPITAL_COMMUNITY): Payer: Self-pay | Admitting: Pharmacy Technician

## 2012-06-05 ENCOUNTER — Encounter (HOSPITAL_COMMUNITY): Payer: Self-pay

## 2012-06-05 ENCOUNTER — Ambulatory Visit (HOSPITAL_COMMUNITY)
Admission: RE | Admit: 2012-06-05 | Discharge: 2012-06-05 | Disposition: A | Discharge status: Home / Self Care | Payer: Medicaid Other | Source: Ambulatory Visit | Attending: General Surgery | Admitting: General Surgery

## 2012-06-05 LAB — BASIC METABOLIC PANEL
BUN: 4 mg/dL — ABNORMAL LOW (ref 6–23)
CO2: 28 mEq/L (ref 19–32)
Chloride: 94 mEq/L — ABNORMAL LOW (ref 96–112)
Glucose, Bld: 93 mg/dL (ref 70–99)
Potassium: 3.3 mEq/L — ABNORMAL LOW (ref 3.5–5.1)
Sodium: 130 mEq/L — ABNORMAL LOW (ref 135–145)

## 2012-06-05 LAB — HEMOGLOBIN AND HEMATOCRIT, BLOOD: HCT: 37 % (ref 36.0–46.0)

## 2012-06-05 LAB — PREPARE RBC (CROSSMATCH)

## 2012-06-05 NOTE — Patient Instructions (Signed)
22 Elizabeth Orr  06/05/2012   Your procedure is scheduled on:  Monday, 06/08/12  Report to [facility] at 0800 AM.  Call this number if you have problems the morning of surgery: [phone]   Remember:   Do not eat food:After Midnight.  May have clear liquids:until Midnight .  Clear liquids include soda, tea, black coffee, apple or grape juice, broth.  Take these medicines the morning of surgery with A SIP OF WATER: norco if needed   Do not wear jewelry, make-up or nail polish.  Do not wear lotions, powders, or perfumes. You may wear deodorant.  Do not shave 48 hours prior to surgery. Men may shave face and neck.  Do not bring valuables to the hospital.  Contacts, dentures or bridgework may not be worn into surgery.  Leave suitcase in the car. After surgery it may be brought to your room.  For patients admitted to the hospital, checkout time is 11:00 AM the day of discharge.   Patients discharged the day of surgery will not be allowed to drive home.  Name and phone number of your driver: family  Special Instructions: Shower using CHG 2 nights before surgery and the night before surgery.  If you shower the day of surgery use CHG.  Use special wash - you have one bottle of CHG for all showers.  You should use approximately 1/3 of the bottle for each shower.   Please read over the following fact sheets that you were given: Pain Booklet, Coughing and Deep Breathing, Blood Transfusion Information, Lab Information, MRSA Information, Surgical Site Infection Prevention, Anesthesia Post-op Instructions and Care and Recovery After Surgery   Total or Modified Radical Mastectomy  Care After Refer to this sheet in the next few weeks. These instructions provide you with information on caring for yourself after your procedure. Your caregiver may also give you more specific instructions. Your treatment has been planned according to current medical practices, but problems sometimes occur. Call your  caregiver if you have any problems or questions after your procedure. ACTIVITY  Your caregiver will advise you when you may resume strenuous activities, driving, and sports.  After the drain(s) are removed, you may do light housework. Avoid heavy lifting, carrying, or pushing. You should not be lifting anything heavier than 5 lbs.  Take frequent rest periods. You may tire more easily than usual.  Always rest and elevate the arm affected by your surgery for a period of time equal to your activity time.  Continue doing the exercises given to you by the physical therapist/occupational therapist even after full range of motion has returned. The amount of time this takes will vary from person to person.  After normal range of motion has returned, some stiffness and soreness may persist for 2-3 months. This is normal and will subside.  Begin sports or strenuous activities in moderation. This will give you a chance to rebuild your endurance. Continue to be cautious of heavy lifting or carrying (no more than 10 lbs.) with your affected arm.  You may return to work as recommended by your caregiver. NUTRITION  You may resume your normal diet.  Make sure you drink plenty of fluids (6-8 glasses a day).  Eat a well-balanced diet. Including daily portions of food from government recommended food groups:  Grains.  Vegetables.  Fruits.  Milk.  Meat & beans.  Oils. Visit DateT[employer].nl for more information HYGIENE  You may wash your hair.  If your incision (cut from surgery)  is closed, you may shower or tub bathe, unless instructed otherwise by your doctor. FEVER  If you feel feverish or have shaking chills, take your temperature. If your temperature is 102 F (38.9 C) or above, call your caregiver. The fever may mean there is an infection.  If you call early, infection can be treated with antibiotics and hospitalization may be avoided. PAIN CONTROL  Mild discomfort may  occur.  You may need to take an over-the-counter pain medication or a medication prescribed by your caregiver.  Call your caregiver if you experience increased pain. INCISION CARE  Check your incision daily for increased redness, drainage, swelling, or separation of skin.  Call your caregiver if any of the above are noted. ARM AND HAND CARE  If the lymph nodes under your arm were removed with a modified radical mastectomy, there may be a greater tendency for the arm to swell.  Try to avoid having blood pressures taken, blood drawn, or injections given in the affected arm. This is the arm on the same side as the surgery.  Use hand lotion to soften cuticles instead of cutting them to avoid cutting yourself.  Be careful when shaving your under arms. Use an electric shaver if possible. You may use a deodorant after the incision has completely healed. Until then, clean under your arms with hydrogen peroxide.  Use reasonable precaution when cooking, sewing, and gardening to avoid burning or needle or thorn pricks.  Do not weigh your arm straight down with a package or your purse.  Follow the exercises and instructions given to you by the physical therapist/occupational therapist and your caregiver. FOLLOW-UP APPOINTMENT Call your caregiver for a follow-up appointment as directed. PROSTHESIS INFORMATION Wear your temporary prosthesis (artificial breast) until your caregiver gives you permission to purchase a permanent one. This will depend upon your rate of healing. We suggest you also wait until you are physically and emotionally ready to shop for one. The suitability depends on several individual factors. We do not endorse any particular prosthesis, but suggest you try several until you are satisfied with appearance and fit. A list of stores may be obtained from your local [employer] at www.cancer.org or 1-800-ACS-2345 (1-986-640-5015). A permanent prosthesis is medically  necessary to restore balance. It is also income tax deductible. Be sure all receipts are marked "surgical". It is not essential to purchase a bra. You may sew a pocket into your regular bra. Note: Remember to take all of your medical insurance information with you when shopping for your prosthesis. SELECTING A PROSTHESIS FITTER You may want to ask the following questions when selecting a fitter:  What styles and brands of forms are carried in stock?  How long have the forms been on the market and have there been any problems with them?  Why would one form be better than another?  How long should a particular form last?  May I wear the form for a trial period without obligation?  Do the forms require a prosthetic bra? If so, what is the price range? Must I always wear that style?  If alterations to the bra are necessary, can they be done at this location or be sent out?  Will I be charged for alterations?  Will I receive suggestions on how to alter my own wardrobe, if necessary?  Will you special order forms or bras if necessary?  Are fitters always available to meet my needs?  What kinds of garments should be worn for the  fitting?  Are lounge wear, swim wear, and accessories available?  If I have insurance coverage or Medicare, will you suggest ways for processing the paper work?  Do you keep complete records so that mail reordering is possible?  How are warranty claims handled if I have a problem with the form? Document Released: 03/28/2004 Document Revised: 10/28/2011 Document Reviewed: 12/01/2007 [facility] Patient Information 2013 [place], [place].

## 2012-06-08 ENCOUNTER — Encounter (HOSPITAL_COMMUNITY)
Admission: RE | Admit: 2012-06-08 | Discharge: 2012-06-08 | Disposition: A | Discharge status: Home / Self Care | Payer: Medicaid Other | Source: Ambulatory Visit | Attending: General Surgery | Admitting: General Surgery

## 2012-06-08 ENCOUNTER — Inpatient Hospital Stay (HOSPITAL_COMMUNITY)
Admission: RE | Admit: 2012-06-08 | Discharge: 2012-06-10 | DRG: 581 | Disposition: A | Discharge status: Home Health | Payer: Medicaid Other | Source: Ambulatory Visit | Attending: General Surgery | Admitting: General Surgery

## 2012-06-08 ENCOUNTER — Encounter (HOSPITAL_COMMUNITY): Payer: Self-pay | Admitting: *Deleted

## 2012-06-08 ENCOUNTER — Encounter (HOSPITAL_COMMUNITY): Payer: Self-pay

## 2012-06-08 ENCOUNTER — Encounter (HOSPITAL_COMMUNITY): Payer: Self-pay | Admitting: Anesthesiology

## 2012-06-08 ENCOUNTER — Encounter (HOSPITAL_COMMUNITY)
Admission: RE | Disposition: A | Discharge status: Home Health | Payer: Self-pay | Source: Ambulatory Visit | Attending: General Surgery

## 2012-06-08 ENCOUNTER — Ambulatory Visit (HOSPITAL_COMMUNITY): Payer: Medicaid Other | Admitting: Anesthesiology

## 2012-06-08 DIAGNOSIS — C50919 Malignant neoplasm of unspecified site of unspecified female breast: Principal | ICD-10-CM | POA: Diagnosis present

## 2012-06-08 HISTORY — DX: Malignant neoplasm of unspecified site of unspecified female breast: C50.919

## 2012-06-08 HISTORY — PX: MASTECTOMY: SHX3

## 2012-06-08 LAB — CBC
Hemoglobin: 9.9 g/dL — ABNORMAL LOW (ref 12.0–15.0)
MCH: 26.8 pg (ref 26.0–34.0)
MCHC: 29.4 g/dL — ABNORMAL LOW (ref 30.0–36.0)
Platelets: 366 10*3/uL (ref 150–400)
RDW: 19.4 % — ABNORMAL HIGH (ref 11.5–15.5)

## 2012-06-08 LAB — CREATININE, SERUM
Creatinine, Ser: 0.52 mg/dL (ref 0.50–1.10)
GFR calc Af Amer: >90 mL/min (ref >90)
GFR calc non Af Amer: >90 mL/min (ref >90)

## 2012-06-08 SURGERY — MASTECTOMY, MODIFIED RADICAL
Anesthesia: General | Site: Breast | Laterality: Right | Wound class: Clean

## 2012-06-08 MED ORDER — PROPOFOL 10 MG/ML IV BOLUS
INTRAVENOUS | Status: DC | PRN
  Administered 2012-06-08: 150 mg via INTRAVENOUS

## 2012-06-08 MED ORDER — FENTANYL CITRATE 0.05 MG/ML IJ SOLN
INTRAMUSCULAR | Status: AC
  Filled 2012-06-08: qty 5

## 2012-06-08 MED ORDER — SODIUM CHLORIDE 0.9 % IR SOLN
Status: DC | PRN
  Administered 2012-06-08: 1000 mL

## 2012-06-08 MED ORDER — NEOSTIGMINE METHYLSULFATE 1 MG/ML IJ SOLN
INTRAMUSCULAR | Status: DC | PRN
  Administered 2012-06-08: 3 mg via INTRAVENOUS

## 2012-06-08 MED ORDER — FENTANYL CITRATE 0.05 MG/ML IJ SOLN
INTRAMUSCULAR | Status: DC | PRN
  Administered 2012-06-08 (×6): 50 ug via INTRAVENOUS

## 2012-06-08 MED ORDER — ALPRAZOLAM 1 MG PO TABS
1.0000 mg | ORAL_TABLET | Freq: Once | ORAL | Status: AC
  Administered 2012-06-08: 1 mg via ORAL
  Filled 2012-06-08: qty 1

## 2012-06-08 MED ORDER — ROCURONIUM BROMIDE 100 MG/10ML IV SOLN
INTRAVENOUS | Status: DC | PRN
  Administered 2012-06-08: 10 mg via INTRAVENOUS
  Administered 2012-06-08: 5 mg via INTRAVENOUS
  Administered 2012-06-08: 20 mg via INTRAVENOUS

## 2012-06-08 MED ORDER — FENTANYL CITRATE 0.05 MG/ML IJ SOLN
INTRAMUSCULAR | Status: AC
  Filled 2012-06-08: qty 2

## 2012-06-08 MED ORDER — HYDROCODONE-ACETAMINOPHEN 5-325 MG PO TABS
1.0000 | ORAL_TABLET | ORAL | Status: DC | PRN
  Administered 2012-06-08 – 2012-06-10 (×11): 2 via ORAL
  Filled 2012-06-08 (×11): qty 2

## 2012-06-08 MED ORDER — DIPHENHYDRAMINE HCL 50 MG/ML IJ SOLN
INTRAMUSCULAR | Status: DC | PRN
  Administered 2012-06-08: 25 mg via INTRAVENOUS

## 2012-06-08 MED ORDER — ONDANSETRON HCL 4 MG/2ML IJ SOLN
INTRAMUSCULAR | Status: DC | PRN
  Administered 2012-06-08: 4 mg via INTRAVENOUS

## 2012-06-08 MED ORDER — FENTANYL CITRATE 0.05 MG/ML IJ SOLN
25.0000 ug | Freq: Once | INTRAMUSCULAR | Status: AC
  Administered 2012-06-08: 50 ug via INTRAVENOUS

## 2012-06-08 MED ORDER — FENTANYL CITRATE 0.05 MG/ML IJ SOLN
25.0000 ug | INTRAMUSCULAR | Status: DC | PRN
  Administered 2012-06-08 (×3): 50 ug via INTRAVENOUS

## 2012-06-08 MED ORDER — CEFAZOLIN SODIUM-DEXTROSE 2-3 GM-% IV SOLR
2.0000 g | INTRAVENOUS | Status: AC
  Administered 2012-06-08: 2 g via INTRAVENOUS

## 2012-06-08 MED ORDER — SODIUM CHLORIDE 0.9 % IJ SOLN
INTRAMUSCULAR | Status: AC
  Administered 2012-06-08: 3 mL
  Filled 2012-06-08: qty 3

## 2012-06-08 MED ORDER — LIDOCAINE HCL 1 % IJ SOLN
INTRAMUSCULAR | Status: DC | PRN
  Administered 2012-06-08 (×2): 50 mg via INTRADERMAL

## 2012-06-08 MED ORDER — CEFAZOLIN SODIUM-DEXTROSE 2-3 GM-% IV SOLR
INTRAVENOUS | Status: AC
  Filled 2012-06-08: qty 50

## 2012-06-08 MED ORDER — ONDANSETRON HCL 4 MG/2ML IJ SOLN
INTRAMUSCULAR | Status: AC
  Filled 2012-06-08: qty 2

## 2012-06-08 MED ORDER — HYDROMORPHONE HCL PF 1 MG/ML IJ SOLN
1.0000 mg | INTRAMUSCULAR | Status: DC | PRN
  Administered 2012-06-08 (×2): 1 mg via INTRAVENOUS
  Filled 2012-06-08 (×2): qty 1

## 2012-06-08 MED ORDER — GLYCOPYRROLATE 0.2 MG/ML IJ SOLN
INTRAMUSCULAR | Status: AC
  Filled 2012-06-08: qty 2

## 2012-06-08 MED ORDER — ONDANSETRON HCL 4 MG/2ML IJ SOLN: 4.0000 mg | Freq: Four times a day (QID) | INTRAMUSCULAR | Status: DC | PRN

## 2012-06-08 MED ORDER — DEXAMETHASONE SODIUM PHOSPHATE 4 MG/ML IJ SOLN
4.0000 mg | Freq: Once | INTRAMUSCULAR | Status: AC
Start: 2012-06-08 — End: 2012-06-08
  Administered 2012-06-08: 4 mg via INTRAVENOUS

## 2012-06-08 MED ORDER — TECHNETIUM TC 99M SULFUR COLLOID FILTERED
0.5000 | Freq: Once | INTRAVENOUS | Status: AC | PRN
  Administered 2012-06-08: 0.55 via INTRADERMAL

## 2012-06-08 MED ORDER — POVIDONE-IODINE 10 % EX OINT
TOPICAL_OINTMENT | CUTANEOUS | Status: AC
  Filled 2012-06-08: qty 2

## 2012-06-08 MED ORDER — GLYCOPYRROLATE 0.2 MG/ML IJ SOLN
0.2000 mg | Freq: Once | INTRAMUSCULAR | Status: AC
  Administered 2012-06-08: 0.2 mg via INTRAVENOUS

## 2012-06-08 MED ORDER — SUCCINYLCHOLINE CHLORIDE 20 MG/ML IJ SOLN
INTRAMUSCULAR | Status: AC
  Filled 2012-06-08: qty 1

## 2012-06-08 MED ORDER — DEXAMETHASONE SODIUM PHOSPHATE 4 MG/ML IJ SOLN
INTRAMUSCULAR | Status: AC
  Filled 2012-06-08: qty 1

## 2012-06-08 MED ORDER — SODIUM CHLORIDE 0.9 % IN NEBU
INHALATION_SOLUTION | RESPIRATORY_TRACT | Status: AC
  Filled 2012-06-08: qty 3

## 2012-06-08 MED ORDER — ALBUTEROL SULFATE (5 MG/ML) 0.5% IN NEBU
INHALATION_SOLUTION | RESPIRATORY_TRACT | Status: AC
  Filled 2012-06-08: qty 0.5

## 2012-06-08 MED ORDER — ROCURONIUM BROMIDE 50 MG/5ML IV SOLN
INTRAVENOUS | Status: AC
  Filled 2012-06-08: qty 1

## 2012-06-08 MED ORDER — TECHNETIUM TC 99M SULFUR COLLOID FILTERED
0.5000 | Freq: Once | INTRAVENOUS | Status: AC | PRN
  Administered 2012-06-08: 0.58 via INTRADERMAL

## 2012-06-08 MED ORDER — MIDAZOLAM HCL 2 MG/2ML IJ SOLN
INTRAMUSCULAR | Status: AC
  Filled 2012-06-08: qty 2

## 2012-06-08 MED ORDER — PROPOFOL 10 MG/ML IV EMUL
INTRAVENOUS | Status: AC
  Filled 2012-06-08: qty 20

## 2012-06-08 MED ORDER — GLYCOPYRROLATE 0.2 MG/ML IJ SOLN
INTRAMUSCULAR | Status: DC | PRN
  Administered 2012-06-08: .5 mg via INTRAVENOUS

## 2012-06-08 MED ORDER — ONDANSETRON HCL 4 MG/2ML IJ SOLN
4.0000 mg | Freq: Once | INTRAMUSCULAR | Status: AC | PRN
  Administered 2012-06-08: 4 mg via INTRAVENOUS

## 2012-06-08 MED ORDER — MIDAZOLAM HCL 2 MG/2ML IJ SOLN
1.0000 mg | INTRAMUSCULAR | Status: AC | PRN
  Administered 2012-06-08 (×3): 2 mg via INTRAVENOUS

## 2012-06-08 MED ORDER — ONDANSETRON HCL 4 MG/2ML IJ SOLN
4.0000 mg | Freq: Once | INTRAMUSCULAR | Status: AC
  Administered 2012-06-08: 4 mg via INTRAVENOUS

## 2012-06-08 MED ORDER — ALBUTEROL SULFATE (5 MG/ML) 0.5% IN NEBU
2.5000 mg | INHALATION_SOLUTION | Freq: Once | RESPIRATORY_TRACT | Status: AC
  Administered 2012-06-08: 2.5 mg via RESPIRATORY_TRACT

## 2012-06-08 MED ORDER — LABETALOL HCL 5 MG/ML IV SOLN
INTRAVENOUS | Status: AC
  Filled 2012-06-08: qty 4

## 2012-06-08 MED ORDER — DIPHENHYDRAMINE HCL 50 MG/ML IJ SOLN
INTRAMUSCULAR | Status: AC
  Filled 2012-06-08: qty 1

## 2012-06-08 MED ORDER — ENOXAPARIN SODIUM 40 MG/0.4ML ~~LOC~~ SOLN
40.0000 mg | SUBCUTANEOUS | Status: DC
  Administered 2012-06-09 – 2012-06-10 (×2): 40 mg via SUBCUTANEOUS
  Filled 2012-06-08 (×2): qty 0.4

## 2012-06-08 MED ORDER — INFLUENZA VIRUS VACC SPLIT PF IM SUSP
0.5000 mL | INTRAMUSCULAR | Status: AC
  Administered 2012-06-09: 0.5 mL via INTRAMUSCULAR
  Filled 2012-06-08: qty 0.5

## 2012-06-08 MED ORDER — SUCCINYLCHOLINE CHLORIDE 20 MG/ML IJ SOLN
INTRAMUSCULAR | Status: DC | PRN
  Administered 2012-06-08: 120 mg via INTRAVENOUS

## 2012-06-08 MED ORDER — LACTATED RINGERS IV SOLN
INTRAVENOUS | Status: DC
  Administered 2012-06-08 (×2): via INTRAVENOUS

## 2012-06-08 MED ORDER — METHYLENE BLUE 1 % INJ SOLN
INTRAMUSCULAR | Status: AC
  Filled 2012-06-08: qty 1

## 2012-06-08 MED ORDER — ENOXAPARIN SODIUM 40 MG/0.4ML ~~LOC~~ SOLN
40.0000 mg | Freq: Once | SUBCUTANEOUS | Status: AC
  Administered 2012-06-08: 40 mg via SUBCUTANEOUS

## 2012-06-08 MED ORDER — GLYCOPYRROLATE 0.2 MG/ML IJ SOLN
INTRAMUSCULAR | Status: AC
  Filled 2012-06-08: qty 1

## 2012-06-08 MED ORDER — LIDOCAINE HCL (PF) 1 % IJ SOLN
INTRAMUSCULAR | Status: AC
  Filled 2012-06-08: qty 5

## 2012-06-08 MED ORDER — ONDANSETRON HCL 4 MG PO TABS: 4.0000 mg | ORAL_TABLET | Freq: Four times a day (QID) | ORAL | Status: DC | PRN

## 2012-06-08 MED ORDER — METOPROLOL TARTRATE 1 MG/ML IV SOLN
INTRAVENOUS | Status: DC | PRN
  Administered 2012-06-08 (×3): 1 mg via INTRAVENOUS

## 2012-06-08 MED ORDER — ENOXAPARIN SODIUM 40 MG/0.4ML ~~LOC~~ SOLN
SUBCUTANEOUS | Status: AC
  Filled 2012-06-08: qty 0.4

## 2012-06-08 SURGICAL SUPPLY — 49 items
APPLIER CLIP 11 MED OPEN (CLIP) ×2
APPLIER CLIP 9.375 SM OPEN (CLIP)
APR CLP MED 11 20 MLT OPN (CLIP) ×1
APR CLP SM 9.3 20 MLT OPN (CLIP)
BAG HAMPER (MISCELLANEOUS) ×2 IMPLANT
BLADE HEX COATED 2.75 (ELECTRODE) ×2 IMPLANT
BLADE KNIFE PERSONA 15 (BLADE) ×1 IMPLANT
BLADE SURG SZ10 CARB STEEL (BLADE) ×1 IMPLANT
BNDG COHESIVE 4X5 TAN NS LF (GAUZE/BANDAGES/DRESSINGS) ×2 IMPLANT
CLIP APPLIE 11 MED OPEN (CLIP) IMPLANT
CLIP APPLIE 9.375 SM OPEN (CLIP) IMPLANT
CLOTH BEACON ORANGE TIMEOUT ST (SAFETY) ×2 IMPLANT
COVER LIGHT HANDLE STERIS (MISCELLANEOUS) ×4 IMPLANT
DRAPE PROXIMA HALF (DRAPES) ×2 IMPLANT
DURAPREP 26ML APPLICATOR (WOUND CARE) ×2 IMPLANT
DURAPREP 6ML APPLICATOR 50/CS (WOUND CARE) ×1 IMPLANT
ELECT REM PT RETURN 9FT ADLT (ELECTROSURGICAL) ×2
ELECTRODE REM PT RTRN 9FT ADLT (ELECTROSURGICAL) ×1 IMPLANT
EVACUATOR DRAINAGE 10X20 100CC (DRAIN) ×1 IMPLANT
EVACUATOR SILICONE 100CC (DRAIN) ×4
GLOVE BIOGEL PI IND STRL 7.5 (GLOVE) ×1 IMPLANT
GLOVE BIOGEL PI INDICATOR 7.5 (GLOVE) ×1
GLOVE ECLIPSE 6.5 STRL STRAW (GLOVE) ×1 IMPLANT
GLOVE ECLIPSE 7.0 STRL STRAW (GLOVE) ×3 IMPLANT
GLOVE EXAM NITRILE MD LF STRL (GLOVE) ×1 IMPLANT
GLOVE INDICATOR 7.0 STRL GRN (GLOVE) ×1 IMPLANT
GLOVE INDICATOR 7.5 STRL GRN (GLOVE) ×1 IMPLANT
GOWN STRL REIN XL XLG (GOWN DISPOSABLE) ×6 IMPLANT
INST SET MINOR GENERAL (KITS) ×2 IMPLANT
KIT ROOM TURNOVER APOR (KITS) ×2 IMPLANT
MANIFOLD NEPTUNE II (INSTRUMENTS) ×2 IMPLANT
NS IRRIG 1000ML POUR BTL (IV SOLUTION) ×2 IMPLANT
PACK MINOR (CUSTOM PROCEDURE TRAY) ×2 IMPLANT
PAD ARMBOARD 7.5X6 YLW CONV (MISCELLANEOUS) ×2 IMPLANT
SET BASIN LINEN APH (SET/KITS/TRAYS/PACK) ×2 IMPLANT
SPONGE DRAIN TRACH 4X4 STRL 2S (GAUZE/BANDAGES/DRESSINGS) ×3 IMPLANT
SPONGE GAUZE 4X4 12PLY (GAUZE/BANDAGES/DRESSINGS) ×2 IMPLANT
SPONGE LAP 18X18 X RAY DECT (DISPOSABLE) ×6 IMPLANT
STAPLER VISISTAT (STAPLE) ×3 IMPLANT
STOCKINETTE IMPERVIOUS LG (DRAPES) ×2 IMPLANT
SUT ETHILON 3 0 FSL (SUTURE) ×2 IMPLANT
SUT SILK 2 0 (SUTURE) ×2
SUT SILK 2 0 SH (SUTURE) ×1 IMPLANT
SUT SILK 2-0 18XBRD TIE 12 (SUTURE) ×1 IMPLANT
SUT VIC AB 3-0 SH 27 (SUTURE) ×4
SUT VIC AB 3-0 SH 27X BRD (SUTURE) ×1 IMPLANT
SUT VICRYL AB 2 0 TIES (SUTURE) IMPLANT
SYR BULB IRRIGATION 50ML (SYRINGE) ×2 IMPLANT
TOWEL OR 17X26 4PK STRL BLUE (TOWEL DISPOSABLE) ×2 IMPLANT

## 2012-06-08 NOTE — H&P (View-Only) (Signed)
NTS SOAP Note  Vital Signs:  Vitals as of: 05/26/2012: Systolic 160: Diastolic 85: Heart Rate 117: Temp 98.7F: Height 5ft 2in: Weight 232Lbs 0 Ounces: Pain Level 9: BMI 42  BMI : 42.43 kg/m2  Subjective: This 51 Years 9 Months old Female presents for of mass in the right breast. Patient noted changes on self breast exam. She was evaluated at the health department and was sent for mammogram and ultrasound. These changes, mammogram patient was referred for surgical evaluation. Patient states she's noted is increase in the size of the mass of the last several months. She didn't denies any skin changes. No erythema. No nipple changes. No nipple discharge. She denies any significant fevers or chills. She has been extremely anxious. She denies any family history. No significant weight changes. No similar lesions in the past.  Review of Symptoms:    anxious    headaches Eyes:unremarkable   Nose/Mouth/Throat:unremarkable Cardiovascular:  unremarkable   Respiratory:unremarkable   Gastrointestinal:  unremarkable   Genitourinary:unremarkable       arthralgias Skin:unremarkable   as per history of present illness Hematolgic/Lymphatic:unremarkable     Allergic/Immunologic:unremarkable     Past Medical History:  Obtained     Past Medical History  Surgical History: tubal ligation Medical Problems: none Psychiatric History: none Allergies: ibuprofen Medications: none   Social History:Obtained  Social History  Preferred Language: Spanish ([place]) Race:  Native Hawaiian or Other Pacific Islander Ethnicity: Not Hispanic / Latino Age: 51 Years 9 Months Marital Status:  M Alcohol: none Recreational drug(s): none   Smoking Status: Never smoker reviewed on 06/01/2012  Family History:Obtained     Family History  Is there a family history of:noncontributory. No family history of breast cancer, ovarian, or uterine cancers. No prostate  cancer history.    Objective Information: General:  Well appearing, well nourished in no distress. anxious Skin:     no rash or prominent lesions Head:Atraumatic; no masses; no abnormalities Eyes:  conjunctiva clear, EOM intact, PERRL Mouth:  Mucous membranes moist, no mucosal lesions. Neck:  Supple without lymphadenopathy.  Heart:  RRR, no murmur Lungs:    CTA bilaterally, no wheezes, rhonchi, rales.  Breathing unlabored.       palpable large mass within the right breast at approximately 12:00 position. Also a small are noted lateral lesionIn the right breast. No nipple retraction. No skin dimpling.  no axillary lymphadenopathy. Abdomen:Soft, NT/ND, no HSM, no masses. Extremities:  No deformities, clubbing, cyanosis, or edema.    mammogram/ultrasound: abnormal mass within the right breast x3. 2 these are in close approximation based on mammogram and ultrasound findings. The third is lateral. Given the findings patient was given a BI-RADS score of 5   Assessment:    Plan: right breast mass. At this time options were discussed at length the patient and family. Less invasive techniques such as stereotactic biopsy or ultrasound-guided biopsy were discussed with with the patient. Patient is adamant that she wishes to be asleep during any procedure.she understands the increase in dizziness of a surgical biopsy but does wish to proceed. She understands the risk of bleeding, infection, as well as the likely need for additional surgeries. She understands as does the family that this is to obtain additional information and is not a definitive therapy should this truly be identified as a this will be scheduled at her earliest convenience as soon as possible.  Patient Education:Alternative treatments to surgery were discussed with patient (and family).  Risks and benefits  of procedure were   fully explained to the patient (and family) who gave informed consent.  Patient/family questions were addressed.  Follow-up:Pending Surgery       

## 2012-06-08 NOTE — Anesthesia Procedure Notes (Signed)
Procedure Name: Intubation Date/Time: 06/08/2012 11:48 AM Performed by: Glynn Octave E Pre-anesthesia Checklist: Patient identified, Patient being monitored, Timeout performed, Emergency Drugs available and Suction available Patient Re-evaluated:Patient Re-evaluated prior to inductionOxygen Delivery Method: Circle System Utilized Preoxygenation: Pre-oxygenation with 100% oxygen Intubation Type: IV induction, Rapid sequence and Cricoid Pressure applied Ventilation: Mask ventilation without difficulty Laryngoscope Size: Mac and 3 Grade View: Grade II Tube type: Oral Tube size: 7.0 mm Number of attempts: 1 Airway Equipment and Method: stylet Placement Confirmation: ETT inserted through vocal cords under direct vision,  positive ETCO2 and breath sounds checked- equal and bilateral Secured at: 20 cm Tube secured with: Tape Dental Injury: Teeth and Oropharynx as per pre-operative assessment

## 2012-06-08 NOTE — Transfer of Care (Signed)
Immediate Anesthesia Transfer of Care Note  Patient: Elizabeth Orr  Procedure(s) Performed: Procedure(s) (LRB) with comments: MASTECTOMY MODIFIED RADICAL (Right) - Sential Node BX is at 8:30  Patient Location: PACU  Anesthesia Type: General  Level of Consciousness: awake and patient cooperative  Airway & Oxygen Therapy: Patient Spontanous Breathing and Patient connected to face mask oxygen  Post-op Assessment: Report given to PACU RN, Post -op Vital signs reviewed and stable and Patient moving all extremities  Post vital signs: Reviewed and stable  Complications: No apparent anesthesia complications

## 2012-06-08 NOTE — Anesthesia Preprocedure Evaluation (Signed)
Anesthesia Evaluation  Patient identified by MRN, date of birth, ID band Patient awake    Reviewed: Allergy & Precautions, H&P , NPO status , Patient's Chart, lab work & pertinent test results  History of Anesthesia Complications Negative for: history of anesthetic complications  Airway Mallampati: II      Dental  (+) Poor Dentition, Chipped and Missing   Pulmonary shortness of breath,  Sever GERD probable silent aspiration at night...chronic cough    + wheezing      Cardiovascular negative cardio ROS  Rhythm:Regular Rate:Normal     Neuro/Psych    GI/Hepatic GERD-  Poorly Controlled,  Endo/Other    Renal/GU Renal disease (stoneskidney stones)     Musculoskeletal   Abdominal   Peds  Hematology   Anesthesia Other Findings   Reproductive/Obstetrics                           Anesthesia Physical Anesthesia Plan  ASA: III  Anesthesia Plan: General   Post-op Pain Management:    Induction: Intravenous, Rapid sequence and Cricoid pressure planned  Airway Management Planned: Oral ETT  Additional Equipment:   Intra-op Plan:   Post-operative Plan: Extubation in OR  Informed Consent: I have reviewed the patients History and Physical, chart, labs and discussed the procedure including the risks, benefits and alternatives for the proposed anesthesia with the patient or authorized representative who has indicated his/her understanding and acceptance.     Plan Discussed with:   Anesthesia Plan Comments: (preop albuterol jet neb.)        Anesthesia Quick Evaluation

## 2012-06-08 NOTE — Interval H&P Note (Signed)
History and Physical Interval Note:  06/08/2012 9:29 AM  Elizabeth Orr  has presented today for surgery, with the diagnosis of Right Breast Cancer   The various methods of treatment have been discussed with the patient and family. After consideration of risks, benefits and other options for treatment, the patient has consented to  Procedure(s) (LRB) with comments: MASTECTOMY MODIFIED RADICAL (Right) - Sential Node BX is at 8:30 as a surgical intervention .  The patient's history has been reviewed, patient examined, no change in status, stable for surgery.  I have reviewed the patient's chart and labs.  Questions were answered to the patient's satisfaction.    Patient now with documented breast CA.  [name] C

## 2012-06-08 NOTE — Anesthesia Postprocedure Evaluation (Signed)
  Anesthesia Post-op Note  Patient: Elizabeth Orr  Procedure(s) Performed: Procedure(s) (LRB) with comments: MASTECTOMY MODIFIED RADICAL (Right) - Sential Node BX is at 8:30  Patient Location: PACU  Anesthesia Type: General  Level of Consciousness: awake, alert , oriented and patient cooperative  Airway and Oxygen Therapy: Patient Spontanous Breathing  Post-op Pain: 6 /10, moderate  Post-op Assessment: Post-op Vital signs reviewed, Patient's Cardiovascular Status Stable, Respiratory Function Stable, Patent Airway, No signs of Nausea or vomiting and Pain level controlled  Post-op Vital Signs: Reviewed and stable  Complications: No apparent anesthesia complications

## 2012-06-08 NOTE — Op Note (Signed)
Patient:  Elizabeth Orr  DOB:  Jan 26, 1961  MRN:  [id number]   Preop Diagnosis:  Right-sided breast invasive ductal carcinoma   Postop Diagnosis:  The same  Procedure:  Attempted right sentinel node biopsy conversion to right modified radical mastectomy  Surgeon:  Dr. Tilford Pillar  Anes:  General endotracheal  Indications:  Patient is a 51 year old female known to me after recent excisional biopsy for suspicious mass in the right breast. Biopsy did prove breast cancer on the right side and options were discussed with the patient. Due to her multifocal nature of her breast disease it was recommended she proceed with a total mastectomy. Plans to proceed with a simple mastectomy with sentinel node biopsy possible right modified radical mastectomy were discussed with the patient. Her questions and concerns were addressed the patient was consented for the planned procedure.  Procedure note:  Patient is taken to the operator says in supine position the or table time the general anesthetic is a Optician, dispensing. Once patient was asleep she symmetrically intubated by the nurse anesthetist. At this point I prepped her right breast with alcohol solution and injected the methylene blue above a palpable seroma. I did massage this area. I was suspicious that some of the contrast material had infiltrated into the stroma as I did get some blue drainage from the initial biopsy incision site. At this point the patient's right breast was prepped with chlorhexidine solution and draped in standard fashion. I did attempt to proceed with sentinel node biopsy in place the probe into the axilla. I was not able to obtain any readings above 17 on the probe. The area over the stroma was noted to be extremely high on record numbers and I was suspicious that the majority of both the radioisotope as well as the blue contrast had entered into the stroma versus the axilla. I therefore opted at this time to proceed with the mastectomy  portion. An elliptical incision was created around the areola and nipple complex extended medially to over the sternum and laterally into the axilla. I did additionally enter into the axilla at this time to see if any intracorporeal readings were higher and again found no evidence of any radioisotope within the axilla. I therefore continued to create my skin flaps superiorly with careful dissection of the breast tissue off the overlying skin flap. This dissection was carried up to the clavicle superiorly medially over the sternum and laterally up into the axilla. I then focused my attention on the inferior skin flap. Similar dissection was carried out using electrocautery to free the underlying breast tissue from the skin flap. This dissection was carried down to the mammary fold and again laterally to the axilla. Upon creating my skin flaps superiorly to the clavicle medially to the sternum inferiorly to the mammary fold and during the dissection out laterally to the axillae began to elevate the breast tissue off the pectoralis fascia. This dissection was carried out from the medial to lateral fashion. Hemostasis was obtained during the dissection with electrocautery. Upon entering into the axillary fat plane I did not encounter any evidence of any bleed I. I did encounter a slightly enlarged lymph node and I did send this for immediate frozen specimen. While the results of the frozen section were pending, I continued my dissection up into the axilla and did proceed with a formal modified radical mastectomy with a level II axillary dissection. This dissection was carried up to the axillary vein which was clearly identified. Dissection  was deep to the pectoralis muscle and continued the dissection along the lateral chest wall to the latissimus dorsi. During this dissection the pathology findings returned indicating there is no evidence of any tumor within the second specimen I opted at this time to complete the  dissection and procedure. Hemostasis in the axilla was carried out using medium surgical clips. Once the axilla was freed and the entire specimen was sent en bloc as a permanent specimen to pathology. It was marked for orientation with a short suture superiorly, and a long suture laterally. The wound was irrigated with copious amounts sterile saline. Hemostasis is excellent. 2 stab incisions were created laterally and inferior to the incision to place 210 flat JP drains. The medial drain was placed along the chest wall underneath the skin flaps. The lateral drain was placed into the axilla. Note the lateral drain was trimmed to about half the length. Both drains were secured to the skin with 3-0 nylon sutures. The skin flaps were then reapproximated using a 3-0 Vicryl to reapproximate the deep subcuticular tissue and running continuous fashion. The skin edges were then reapproximated using skin staples. The skin was washed dried moist dry towel. Sterile dressings were placed the drapes removed the dressings were secured the patient was allowed to come out of general anesthetic and stretcher the PACU in stable condition. At the conclusion of procedure all instrument, sponge, needle counts are correct. Patient tolerated procedure extremely well.  Complications:  None apparent  EBL:  200 mL  Specimen:  Right breast and axillary tissue. Right lymph node for frozen section.

## 2012-06-09 LAB — CBC
MCH: 27.2 pg (ref 26.0–34.0)
Platelets: 345 10*3/uL (ref 150–400)
RBC: 3.42 MIL/uL — ABNORMAL LOW (ref 3.87–5.11)
WBC: 9 10*3/uL (ref 4.0–10.5)

## 2012-06-09 LAB — BASIC METABOLIC PANEL
CO2: 30 mEq/L (ref 19–32)
Calcium: 9.3 mg/dL (ref 8.4–10.5)
Chloride: 100 mEq/L (ref 96–112)
Sodium: 138 mEq/L (ref 135–145)

## 2012-06-09 MED ORDER — SODIUM CHLORIDE 0.9 % IJ SOLN
3.0000 mL | Freq: Two times a day (BID) | INTRAMUSCULAR | Status: DC
  Administered 2012-06-09 – 2012-06-10 (×3): 3 mL via INTRAVENOUS
  Filled 2012-06-09 (×3): qty 3

## 2012-06-09 MED ORDER — SODIUM CHLORIDE 0.9 % IV SOLN: 250.0000 mL | INTRAVENOUS | Status: DC | PRN

## 2012-06-09 MED ORDER — SODIUM CHLORIDE 0.9 % IJ SOLN: 3.0000 mL | INTRAMUSCULAR | Status: DC | PRN

## 2012-06-09 MED ORDER — MAGNESIUM HYDROXIDE 400 MG/5ML PO SUSP
30.0000 mL | Freq: Once | ORAL | Status: AC
  Administered 2012-06-09: 30 mL via ORAL
  Filled 2012-06-09: qty 30

## 2012-06-09 MED ORDER — ALPRAZOLAM 1 MG PO TABS
1.0000 mg | ORAL_TABLET | Freq: Every day | ORAL | Status: DC
  Administered 2012-06-09: 1 mg via ORAL
  Filled 2012-06-09: qty 1

## 2012-06-09 NOTE — Progress Notes (Signed)
Patient stated to nurse that she always has trouble sleeping and that she takes xanax at home for sleep. She asked if she could have it tonight for sleep. Doctor was notified and new orders were given for a one time dose.

## 2012-06-09 NOTE — Progress Notes (Signed)
1 Day Post-Op  Subjective: Pain somewhat controlled.  Tolerating PO.  Still limited with rue movement.  Objective: Vital signs in last 24 hours: Temp:  [97.6 F (36.4 C)-99 F (37.2 C)] 97.6 F (36.4 C) (10/22 1135) Pulse Rate:  [71-96] 75  (10/22 1135) Resp:  [18-20] 20  (10/22 1135) BP: (108-160)/(30-88) 108/72 mmHg (10/22 1135) SpO2:  [93 %-98 %] 98 % (10/22 1135) Weight:  [106.142 kg (234 lb)] 106.142 kg (234 lb) (10/22 0615) Last BM Date: 06/02/12  Intake/Output from previous day: 10/21 0701 - 10/22 0700 In: 1100 [I.V.:1100] Out: 445 [Drains:145; Blood:300] Intake/Output this shift: Total I/O In: -  Out: 50 [Drains:50]  General appearance: alert and no distress Chest wall: Expected R sided tenderness.  Minimal serosang discharge from JPs.  Dressing clean dry.  Lab Results:   [facility] 06/09/12 0602 06/08/12 1846  WBC 9.0 11.3*  HGB 9.3* 9.9*  HCT 31.7* 33.7*  PLT 345 366   BMET  Basename 06/09/12 0602 06/08/12 1846  NA 138 --  K 4.1 --  CL 100 --  CO2 30 --  GLUCOSE 121* --  BUN 5* --  CREATININE 0.55 0.52  CALCIUM 9.3 --   PT/INR No results found for this basename: LABPROT:2,INR:2 in the last 72 hours ABG No results found for this basename: PHART:2,PCO2:2,PO2:2,HCO3:2 in the last 72 hours  Studies/Results: Nm Sentinel Node Inj-no Rpt (breast)  06/08/2012  CLINICAL DATA: Rt breast   Sulfur colloid was injected intradermally by the nuclear medicine  technologist for breast cancer sentinel node localization.      Anti-infectives: Anti-infectives     Start     Dose/Rate Route Frequency Ordered Stop   06/08/12 0834   ceFAZolin (ANCEF) IVPB 2 g/50 mL premix        2 g 100 mL/hr over 30 Minutes Intravenous On call to O.R. 06/08/12 0834 06/08/12 1200          Assessment/Plan: s/p Procedure(s) (LRB) with comments: MASTECTOMY MODIFIED RADICAL (Right) - Sential Node BX is at 8:30 Overall she is doing well.  Reassured regarding pain.  Increase ROM  movements of RUE.  Plan for likely discharge in the next 24-48 hours.  LOS: 1 day    Saleh Ulbrich C 06/09/2012

## 2012-06-09 NOTE — Anesthesia Postprocedure Evaluation (Signed)
  Anesthesia Post-op Note  Patient: Elizabeth Orr  Procedure(s) Performed: Procedure(s) (LRB) with comments: MASTECTOMY MODIFIED RADICAL (Right) - Sential Node BX is at 8:30  Patient Location: room 310  Anesthesia Type: General  Level of Consciousness: awake, alert , oriented and patient cooperative  Airway and Oxygen Therapy: Patient Spontanous Breathing and Patient connected nasal cannula  Post-op Pain: mild  Post-op Assessment: Post-op Vital signs reviewed, Patient's Cardiovascular Status Stable, Respiratory Function Stable, Patent Airway and No signs of Nausea or vomiting  Post-op Vital Signs: Reviewed and stable  Complications: No apparent anesthesia complications

## 2012-06-09 NOTE — Addendum Note (Signed)
Addendum  created 06/09/12 0758 by Marolyn Hammock, CRNA   Modules edited:Notes Section

## 2012-06-10 LAB — CBC
MCV: 93.8 fL (ref 78.0–100.0)
Platelets: 303 10*3/uL (ref 150–400)
RBC: 3.36 MIL/uL — ABNORMAL LOW (ref 3.87–5.11)
WBC: 8.5 10*3/uL (ref 4.0–10.5)

## 2012-06-10 LAB — BASIC METABOLIC PANEL
CO2: 28 mEq/L (ref 19–32)
Chloride: 104 mEq/L (ref 96–112)
Creatinine, Ser: 0.5 mg/dL (ref 0.50–1.10)

## 2012-06-10 MED ORDER — HYDROCODONE-ACETAMINOPHEN 5-325 MG PO TABS: 1.0000 | ORAL_TABLET | ORAL | Status: DC | PRN

## 2012-06-10 NOTE — Care Management Note (Signed)
    Page 1 of 1   06/10/2012     2:06:49 PM   CARE MANAGEMENT NOTE 06/10/2012  Patient:  Elizabeth Orr, Elizabeth Orr   Account Number:  [id number]  Date Initiated:  06/10/2012  Documentation initiated by:  Sharrie Rothman  Subjective/Objective Assessment:   Pt admitted from home s/p breast ca. Pt lives with her husband and will return home at discharge. Pt is independent with ADL's.     Action/Plan:   Pt refused any HH at this time. She is confident that she and her husband can take care of her JP drain. No other Cm needs noted.   Anticipated DC Date:  06/10/2012   Anticipated DC Plan:  HOME W HOME HEALTH SERVICES      DC Planning Services  CM consult      Choice offered to / List presented to:             Status of service:  Completed, signed off Medicare Important Message given?   (If response is "NO", the following Medicare IM given date fields will be blank) Date Medicare IM given:   Date Additional Medicare IM given:    Discharge Disposition:  HOME/SELF CARE  Per UR Regulation:    If discussed at Long Length of Stay Meetings, dates discussed:    Comments:  06/10/12 1405 Arlyss Queen, RN BSN Cm

## 2012-06-10 NOTE — Progress Notes (Signed)
Patient given discharge instructions and prescriptions with no questions unanswered. Patient demonstrated emptying of JP drains. acknowledged discharge instructions including when to call the doctor, f/u appointments, s/s of infection, and after care precautions. Patient left with husband in satisfactory condition. Taken out of facility by staff via wheelchair.

## 2012-06-10 NOTE — Progress Notes (Signed)
UR chart review completed.  

## 2012-06-14 LAB — TYPE AND SCREEN
ABO/RH(D): O POS
Antibody Screen: NEGATIVE
Unit division: 0

## 2012-06-17 NOTE — Discharge Summary (Signed)
Physician Discharge Summary  Patient ID: Elizabeth Orr MRN: [id number] DOB/AGE: 1961/02/03 51 y.o.  Admit date: 06/08/2012 Discharge date: 06/10/2012  Admission Diagnoses: Breast cancer  Discharge Diagnoses: The same Active Problems:  * No active hospital problems. *    Discharged Condition: stable  Hospital Course:  Patient presented for definitive treatment for her diagnosed right-sided breast cancer. Patient underwent a modified radical mastectomy on the right side. She was admitted for continued postoperative care. She did have drains placed with continued drain management. Her initial postoperative pain was poorly controlled at control was obtained prior to discharge. On postoperative day 2 patient was tolerating regular diet her pain was better controlled. She was comfortable with drain care and plans were made for discharge.  Consults: None  Significant Diagnostic Studies: labs: Daily labs  Treatments: IV hydration and surgery: Right modified radical mastectomy  Discharge Exam: Blood pressure 149/79, pulse 86, temperature 98.1 F (36.7 C), temperature source Oral, resp. rate 18, height 5\' 1"  (1.549 m), weight 106.142 kg (234 lb), SpO2 97.00%. General appearance: alert and no distress Resp: clear to auscultation bilaterally Chest wall: Expected postoperative tenderness. Dressings are intact. Incision is clean dry and intact. Skin flaps are viable. Drains are in place with minimal discharge. Incisions clean dry and intact with staples present. Cardio: regular rate and rhythm  Disposition: 06-Home-Health Care Svc  Discharge Orders    Future Orders Please Complete By Expires   Diet - low sodium heart healthy      Increase activity slowly      Discharge instructions      Comments:   Increase activity as tolerated. May place ice pack for comfort.  Alternate an anti-inflammatory such as ibuprofen (Motrin, Advil) 400-600mg  every 6 hours with the prescribed pain medication.     Do not take any additional acetaminophen as there is Tylenol in the pain medication.  Drain care as described   Driving Restrictions      Comments:   No driving while on pain medications.   Lifting restrictions      Comments:   Limit excessive overhead movements with Right arm   Discharge wound care:      Comments:   Wash with soap and water.  No soaking.  May shower.  Cover with gauze or with female hygiene pad as needed if some drainage is noted.   Call MD for:  temperature >100.4      Call MD for:  persistant nausea and vomiting      Call MD for:  severe uncontrolled pain      Call MD for:  redness, tenderness, or signs of infection (pain, swelling, redness, odor or green/yellow discharge around incision site)          Medication List     As of 06/17/2012  9:01 AM    TAKE these medications         diphenhydrAMINE 25 mg capsule   Commonly known as: BENADRYL   Take 25 mg by mouth at bedtime as needed. Pain/sleep aid  OTC med      HYDROcodone-acetaminophen 5-325 MG per tablet   Commonly known as: NORCO/VICODIN   Take 1-2 tablets by mouth every 4 (four) hours as needed for pain.           Follow-up Information    Follow up with Fabio Bering, MD. On 06/16/2012. (@ 10:30am)    Contact information:   [address] [place] [address] [phone]  Signed: Marsia Cino C 06/17/2012, 9:01 AM

## 2012-06-25 ENCOUNTER — Encounter (HOSPITAL_COMMUNITY): Payer: Medicaid Other | Attending: Oncology | Admitting: Oncology

## 2012-06-25 ENCOUNTER — Encounter (HOSPITAL_COMMUNITY): Payer: Self-pay | Admitting: Oncology

## 2012-06-25 VITALS — BP 148/95 | HR 94 | Temp 97.1°F | Resp 20 | Ht 61.0 in | Wt 217.5 lb

## 2012-06-25 DIAGNOSIS — C50419 Malignant neoplasm of upper-outer quadrant of unspecified female breast: Secondary | ICD-10-CM | POA: Insufficient documentation

## 2012-06-25 DIAGNOSIS — F3289 Other specified depressive episodes: Secondary | ICD-10-CM | POA: Insufficient documentation

## 2012-06-25 DIAGNOSIS — C50919 Malignant neoplasm of unspecified site of unspecified female breast: Secondary | ICD-10-CM

## 2012-06-25 DIAGNOSIS — R062 Wheezing: Secondary | ICD-10-CM

## 2012-06-25 DIAGNOSIS — D649 Anemia, unspecified: Secondary | ICD-10-CM | POA: Insufficient documentation

## 2012-06-25 DIAGNOSIS — F329 Major depressive disorder, single episode, unspecified: Secondary | ICD-10-CM

## 2012-06-25 DIAGNOSIS — M545 Low back pain, unspecified: Secondary | ICD-10-CM | POA: Insufficient documentation

## 2012-06-25 DIAGNOSIS — F418 Other specified anxiety disorders: Secondary | ICD-10-CM

## 2012-06-25 DIAGNOSIS — F411 Generalized anxiety disorder: Secondary | ICD-10-CM | POA: Insufficient documentation

## 2012-06-25 DIAGNOSIS — C50219 Malignant neoplasm of upper-inner quadrant of unspecified female breast: Secondary | ICD-10-CM | POA: Insufficient documentation

## 2012-06-25 DIAGNOSIS — C773 Secondary and unspecified malignant neoplasm of axilla and upper limb lymph nodes: Secondary | ICD-10-CM

## 2012-06-25 LAB — CBC WITH DIFFERENTIAL/PLATELET
Basophils Absolute: 0 10*3/uL (ref 0.0–0.1)
Basophils Relative: 0 % (ref 0–1)
Eosinophils Absolute: 0.5 10*3/uL (ref 0.0–0.7)
Eosinophils Relative: 7 % — ABNORMAL HIGH (ref 0–5)
HCT: 34.9 % — ABNORMAL LOW (ref 36.0–46.0)
Hemoglobin: 10.4 g/dL — ABNORMAL LOW (ref 12.0–15.0)
MCH: 25.2 pg — ABNORMAL LOW (ref 26.0–34.0)
MCHC: 29.8 g/dL — ABNORMAL LOW (ref 30.0–36.0)
Monocytes Absolute: 0.4 10*3/uL (ref 0.1–1.0)
Monocytes Relative: 5 % (ref 3–12)
RDW: 17.6 % — ABNORMAL HIGH (ref 11.5–15.5)

## 2012-06-25 LAB — COMPREHENSIVE METABOLIC PANEL
ALT: 27 U/L (ref 0–35)
AST: 41 U/L — ABNORMAL HIGH (ref 0–37)
Alkaline Phosphatase: 122 U/L — ABNORMAL HIGH (ref 39–117)
CO2: 26 mEq/L (ref 19–32)
Calcium: 9.6 mg/dL (ref 8.4–10.5)
GFR calc non Af Amer: >90 mL/min (ref >90)
Glucose, Bld: 119 mg/dL — ABNORMAL HIGH (ref 70–99)
Potassium: 4 mEq/L (ref 3.5–5.1)
Sodium: 134 mEq/L — ABNORMAL LOW (ref 135–145)
Total Protein: 8.1 g/dL (ref 6.0–8.3)

## 2012-06-25 LAB — FERRITIN: Ferritin: 14 ng/mL (ref 10–291)

## 2012-06-25 LAB — IRON AND TIBC: Saturation Ratios: 11 % — ABNORMAL LOW (ref 20–55)

## 2012-06-25 MED ORDER — ALBUTEROL SULFATE HFA 108 (90 BASE) MCG/ACT IN AERS: 2.0000 | INHALATION_SPRAY | Freq: Four times a day (QID) | RESPIRATORY_TRACT | Status: DC | PRN

## 2012-06-25 MED ORDER — VENLAFAXINE HCL 37.5 MG PO TABS: 37.5000 mg | ORAL_TABLET | Freq: Two times a day (BID) | ORAL | Status: DC

## 2012-06-25 MED ORDER — ALPRAZOLAM 0.25 MG PO TABS: ORAL_TABLET | ORAL | Status: DC

## 2012-06-25 NOTE — Patient Instructions (Addendum)
[facility]  Discharge Instructions  RECOMMENDATIONS MADE BY THE CONSULTANT AND ANY TEST RESULTS WILL BE SENT TO YOUR REFERRING DOCTOR.   See Dr.Ziegler today as scheduled. We may have some delays in getting you started with chemotherapy until your wound has healed. Dr.Ziegler will need to put in a port a cath that you will have chemotherapy through. Chemotherapy will be apx 6-7 months. We will have you come back and talk to a nurse about the kind of chemotherapy you will receive and how often your treatments will be. We will schedule you for some CT scans and bone xrays to see if you have cancer any where else in your body. A prescription for a anti-depressant was sent to [facility]. You were given a written prescription for Xanax for anxiety. We will have you come back to see the doctor here in about 2 weeks to discuss your plan of care.   I acknowledge that I have been informed and understand all the instructions given to me and received a copy. I do not have any more questions at this time, but understand that I may call the Specialty Clinic at [facility] at [phone] during business hours should I have any further questions or need assistance in obtaining follow-up care.    __________________________________________  _____________  __________ Signature of Patient or Authorized Representative            Date                   Time    __________________________________________ Nurse's Signature
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
## 2012-06-26 ENCOUNTER — Other Ambulatory Visit (HOSPITAL_COMMUNITY): Payer: Self-pay | Admitting: *Deleted

## 2012-06-26 MED ORDER — POLYSACCHARIDE IRON COMPLEX 150 MG PO CAPS: 150.0000 mg | ORAL_CAPSULE | Freq: Every day | ORAL | Status: DC

## 2012-06-30 ENCOUNTER — Encounter (HOSPITAL_COMMUNITY): Payer: Self-pay

## 2012-06-30 ENCOUNTER — Encounter (HOSPITAL_COMMUNITY)
Admission: RE | Admit: 2012-06-30 | Discharge: 2012-06-30 | Disposition: A | Discharge status: Home / Self Care | Payer: Medicaid Other | Source: Ambulatory Visit | Attending: Oncology | Admitting: Oncology

## 2012-06-30 ENCOUNTER — Ambulatory Visit (HOSPITAL_COMMUNITY)
Admission: RE | Admit: 2012-06-30 | Discharge: 2012-06-30 | Disposition: A | Discharge status: Home / Self Care | Payer: Medicaid Other | Source: Ambulatory Visit | Attending: Oncology | Admitting: Oncology

## 2012-06-30 ENCOUNTER — Telehealth (HOSPITAL_COMMUNITY): Payer: Self-pay | Admitting: Oncology

## 2012-06-30 DIAGNOSIS — Z4789 Encounter for other orthopedic aftercare: Secondary | ICD-10-CM | POA: Insufficient documentation

## 2012-06-30 DIAGNOSIS — K7689 Other specified diseases of liver: Secondary | ICD-10-CM | POA: Insufficient documentation

## 2012-06-30 DIAGNOSIS — C50919 Malignant neoplasm of unspecified site of unspecified female breast: Secondary | ICD-10-CM

## 2012-06-30 DIAGNOSIS — R937 Abnormal findings on diagnostic imaging of other parts of musculoskeletal system: Secondary | ICD-10-CM | POA: Insufficient documentation

## 2012-06-30 DIAGNOSIS — F418 Other specified anxiety disorders: Secondary | ICD-10-CM

## 2012-06-30 DIAGNOSIS — R062 Wheezing: Secondary | ICD-10-CM

## 2012-06-30 DIAGNOSIS — C773 Secondary and unspecified malignant neoplasm of axilla and upper limb lymph nodes: Secondary | ICD-10-CM | POA: Insufficient documentation

## 2012-06-30 MED ORDER — IOHEXOL 300 MG/ML  SOLN
100.0000 mL | Freq: Once | INTRAMUSCULAR | Status: AC | PRN
  Administered 2012-06-30: 100 mL via INTRAVENOUS

## 2012-06-30 MED ORDER — TECHNETIUM TC 99M MEDRONATE IV KIT
25.0000 | PACK | Freq: Once | INTRAVENOUS | Status: AC | PRN
  Administered 2012-06-30: 25 via INTRAVENOUS

## 2012-07-06 ENCOUNTER — Telehealth (HOSPITAL_COMMUNITY): Payer: Self-pay

## 2012-07-06 ENCOUNTER — Other Ambulatory Visit (HOSPITAL_COMMUNITY): Payer: Self-pay

## 2012-07-06 MED ORDER — TEMAZEPAM 30 MG PO CAPS: 30.0000 mg | ORAL_CAPSULE | Freq: Every evening | ORAL | Status: DC | PRN

## 2012-07-06 NOTE — Telephone Encounter (Signed)
Prescription for Restoril 30 mg @ HS PRN sleep # 30 with no refills called into [facility] per Dr. Thornton Papas request.  Patient notified and instructed that she cannot take Alprazolam with the Restoril.  Verbalized understanding of instructions.

## 2012-07-06 NOTE — Telephone Encounter (Signed)
Call from patient, has been taking 1 xanax (.25mg ) in the evening and 2 at bedtime and states "I'm not sleeping at all.  I wake up every hour and the xanax has not done anything as far as helping me to sleep.  Is there anything that the doctor will give me to help me sleep at night.  This has been going on for about 3 weeks and is getting worse."

## 2012-07-09 ENCOUNTER — Encounter (HOSPITAL_BASED_OUTPATIENT_CLINIC_OR_DEPARTMENT_OTHER): Payer: Medicaid Other | Admitting: Oncology

## 2012-07-09 ENCOUNTER — Encounter (HOSPITAL_COMMUNITY): Payer: Self-pay | Admitting: Oncology

## 2012-07-09 VITALS — BP 142/80 | HR 87 | Temp 97.6°F | Resp 18 | Wt 213.7 lb

## 2012-07-09 DIAGNOSIS — D649 Anemia, unspecified: Secondary | ICD-10-CM

## 2012-07-09 DIAGNOSIS — Z170 Estrogen receptor positive status [ER+]: Secondary | ICD-10-CM

## 2012-07-09 DIAGNOSIS — C50919 Malignant neoplasm of unspecified site of unspecified female breast: Secondary | ICD-10-CM

## 2012-07-09 DIAGNOSIS — B029 Zoster without complications: Secondary | ICD-10-CM

## 2012-07-09 HISTORY — DX: Malignant neoplasm of unspecified site of unspecified female breast: C50.919

## 2012-07-09 MED ORDER — FAMCICLOVIR 500 MG PO TABS: 500.0000 mg | ORAL_TABLET | Freq: Three times a day (TID) | ORAL | Status: DC

## 2012-07-09 NOTE — Patient Instructions (Signed)
[facility] Discharge Instructions  RECOMMENDATIONS MADE BY THE CONSULTANT AND ANY TEST RESULTS WILL BE SENT TO YOUR REFERRING PHYSICIAN.  EXAM FINDINGS BY THE PHYSICIAN TODAY AND SIGNS OR SYMPTOMS TO REPORT TO CLINIC OR PRIMARY PHYSICIAN: Exam findings as discussed by T. Jacalyn Lefevre, PA-C.  You were noted to have shingles, and we prescribed a medication to treat [facility].  We will follow-up with you in the coming weeks for chemotherapy teaching.  Please call us sooner with any questions or concerns related to our care.  MEDICATIONS PRESCRIBED:  [facility] - take as directed.  Thank you for choosing [facility] to provide your oncology and hematology care.  To afford each patient quality time with our providers, please arrive at least 15 minutes before your scheduled appointment time.  With your help, our goal is to use those 15 minutes to complete the necessary work-up to ensure our physicians have the information they need to help with your evaluation and healthcare recommendations.    Effective January 1st, 2014, we ask that you re-schedule your appointment with our physicians should you arrive 10 or more minutes late for your appointment.  We strive to give you quality time with our providers, and arriving late affects you and other patients whose appointments are after yours.    Again, thank you for choosing [facility].  Our hope is that these requests will decrease the amount of time that you wait before being seen by our physicians.       _____________________________________________________________  I acknowledge that I have been informed and understand all the instructions given to me and received a copy. I do not have anymore questions at this time but understand that I may call the Cancer Center at [facility] at [phone] during business hours should I have any further questions or need assistance in  obtaining follow-up care.    __________________________________________  _____________  __________ Signature of Patient or Authorized Representative            Date                   Time    __________________________________________ Nurse's Signature

## 2012-07-09 NOTE — Progress Notes (Addendum)
[name], NP No address on file  1. Infiltrating ductal carcinoma of breast     CURRENT THERAPY:S/P right modified radical mastectomy by Dr. Leticia Penna on 06/08/2012   INTERVAL HISTORY: Philemon Kingdom 51 y.o. female returns for  regular  visit for followup of Stage IIIa right-sided, grade 2, infiltrating ductal carcinoma with 3 separate primaries giving her multicentric disease in the upper outer and upper inner quadrants of the right breast complicated by 4 positive lymph nodes, all with extracapsular extension, with metastases greater than 2 cm within the lymph nodes. 2 of the 3 tumors were analyzed for ER and PR receptor status. Both tumors are strongly positive for estrogen receptors and progesterone receptors. The one tumor was 79% ER positive and the second was 76% PR positive. Both were 100% PR positive. HER-2/neu was not amplified in any of the 3 tumors. Ki-67 marker was under 20% in both tumors.  S/P right modified radical mastectomy by Dr. Leticia Penna on 06/08/2012  Nykira is here to discuss her future plan and make sure everything is in order.  The patient has an appointment tomorrow morning with Dr. Thersa Salt for radiation oncology consultation.    She has an appointment with Dr. Leticia Penna for port-a-cath placement the first week in December.   So we had a very generalized conversation regarding chemotherapy.  We discussed the potentials risks, benefits, alternativer, and side effects of chemotherapy including decreased blood counts, increased risk for infection, alopecia, nausea, vomiting, diarrhea, constipation, etc.   We spoke about "cycles" of chemotherapy and what that means.   I personally reviewed and went over pathology results with the patient.  She was informed that she is officially stage III breast cancer and potentially curable.   Her chemotherapy plan has not yet been built in [facility] and I will defer the selection of her chemotherapeutic regimen to Dr. Mariel Sleet.  The  patient denies any complaints, but on physical exam, the patient has right supraclavicular erythematous, pustules, approximately 20 in number.  On further discussion, the patient reports that the lesions appeared within 72 hours and are mildly pruritic. So we will treat this with Famvir 500 mg TID for 7 days.  She denies any complaints and complete ROS questioning is negative.   Past Medical History  Diagnosis Date  . Kidney stones 1989  . GERD (gastroesophageal reflux disease)   . Arthritis   . History of stomach ulcers 2003    Hx of esophageal ulcers and stomach ulcers due to reflux  . Breast cancer 06/08/12    right mastectomy  . Back pain   . Infiltrating ductal carcinoma of breast 07/09/2012    has Infiltrating ductal carcinoma of breast on her problem list.     is allergic to motrin.  Ms. Blas had no medications administered during this visit.  Past Surgical History  Procedure Date  . Esophagogastroduodenoscopy   . Tubal ligation 1989  . Breast surgery     breast biopsy-right  . Mastectomy 06/08/12    right  . Esophageal dilation     Denies any headaches, dizziness, double vision, fevers, chills, night sweats, nausea, vomiting, diarrhea, constipation, chest pain, heart palpitations, shortness of breath, blood in stool, black tarry stool, urinary pain, urinary burning, urinary frequency, hematuria.   PHYSICAL EXAMINATION  ECOG PERFORMANCE STATUS: 1 - Symptomatic but completely ambulatory  Filed Vitals:   07/09/12 1332  BP: 142/80  Pulse: 87  Temp: 97.6 F (36.4 C)  Resp: 18    GENERAL:alert, no distress,  well nourished, well developed, comfortable, cooperative, obese and smiling SKIN: skin color, texture, turgor are normal, erythematous herpetic lesions of the right C5 dermatome arising within 72 hours. HEAD: Normocephalic, No masses, lesions, tenderness or abnormalities EYES: normal, Conjunctiva are pink and non-injected EARS: External ears  normal OROPHARYNX:lips, buccal mucosa, and tongue normal and mucous membranes are moist  NECK: supple, no adenopathy, thyroid normal size, non-tender, without nodularity, no stridor, non-tender, trachea midline LYMPH:  no palpable lymphadenopathy, no hepatosplenomegaly BREAST:left breast normal without mass, skin or nipple changes or axillary nodes, right post-mastectomy site healing nicely without any erythema or infectious discharge.  LUNGS: clear to auscultation and percussion HEART: regular rate & rhythm, no murmurs, no gallops, S1 normal and S2 normal ABDOMEN:abdomen soft, non-tender, obese, normal bowel sounds, no masses or organomegaly and no hepatosplenomegaly BACK: Back symmetric, no curvature., No CVA tenderness EXTREMITIES:less then 2 second capillary refill, no joint deformities, effusion, or inflammation, no edema, no skin discoloration, no clubbing, no cyanosis  NEURO: alert & oriented x 3 with fluent speech, no focal motor/sensory deficits, gait normal    LABORATORY DATA: CBC    Component Value Date/Time   WBC 7.8 06/25/2012 1005   RBC 4.13 06/25/2012 1005   HGB 10.4* 06/25/2012 1005   HCT 34.9* 06/25/2012 1005   PLT 531* 06/25/2012 1005   MCV 84.5 06/25/2012 1005   MCH 25.2* 06/25/2012 1005   MCHC 29.8* 06/25/2012 1005   RDW 17.6* 06/25/2012 1005   LYMPHSABS 2.0 06/25/2012 1005   MONOABS 0.4 06/25/2012 1005   EOSABS 0.5 06/25/2012 1005   BASOSABS 0.0 06/25/2012 1005      Chemistry      Component Value Date/Time   NA 134* 06/25/2012 1005   K 4.0 06/25/2012 1005   CL 98 06/25/2012 1005   CO2 26 06/25/2012 1005   BUN 3* 06/25/2012 1005   CREATININE 0.54 06/25/2012 1005      Component Value Date/Time   CALCIUM 9.6 06/25/2012 1005   ALKPHOS 122* 06/25/2012 1005   AST 41* 06/25/2012 1005   ALT 27 06/25/2012 1005   BILITOT 0.5 06/25/2012 1005     Lab Results  Component Value Date   CALCIUM 9.6 06/25/2012   Lab Results  Component Value Date   LABCA2 18 06/25/2012      PATHOLOGY: 06/08/2012  Diagnosis 1. Lymph node, sentinel, biopsy, right axillary - ONE BENIGN LYMPH NODE, NEGATIVE FOR METASTATIC CARCINOMA (0/1). 2. Breast, modified radical mastectomy , right - NO RESIDUAL CARCINOMA. - FOCAL ATYPICAL DUCTAL HYPERPLASIA. - EXTENSIVE PREVIOUS EXCISIONAL SITE CHANGES. - INKED NEW RESECTION MARGINS, NEGATIVE FOR ATYPIA OR MALIGNANCY. - FOUR OF NINE LYMPH NODES, POSITIVE FOR METASTATIC CARCINOMA WITH EXTRANODAL EXTENSION (4/9). - PLEASE SEE COMMENT. Microscopic Comment 2. BREAST, INVASIVE TUMOR, WITH LYMPH NODE SAMPLING PLEASE CORRELATE WITH PREVIOUS CASE [phone] FOR DETAILS. Lymph nodes: # examined: 10 Lymph nodes with metastasis:4 Isolated tumor cells (< 0.2 mm): 0 Micrometastasis: (> 0.2 mm and < 2.0 mm): 0 Macrometastasis: (> 2.0 mm): 2 Extracapsular extension: 2 TNM: mpT2, pN2a Abigail Miyamoto MD Pathologist, Electronic Signature (Case signed 06/10/2012)     ASSESSMENT:  1. Stage IIIa right-sided, grade 2, infiltrating ductal carcinoma with 3 separate primaries giving her multicentric disease in the upper outer and upper inner quadrants of the right breast complicated by 4 positive lymph nodes, all with extracapsular extension, with metastases greater than 2 cm within the lymph nodes. 2 of the 3 tumors were analyzed for ER and PR receptor status. Both  tumors are strongly positive for estrogen receptors and progesterone receptors. The one tumor was 79% ER positive and the second was 76% PR positive. Both were 100% PR positive. HER-2/neu was not amplified in any of the 3 tumors. Ki-67 marker was under 20% in both tumors.  2. Dhronic depression with anxiety times several years.  3. Probable asthma with wheezing bilaterally  4. Obesity  5. Poor dental hygiene  6. Chronic low back pain  7. Normocytic anemia unclear etiology 8. Herpes zoster of C5 dermatome  PLAN: 1. I personally reviewed and went over pathology results with the  patient. 2. Radiation oncology consultation tomorrow with Dr. Thersa Salt. 3. Port-a-cath insertion the first week of December by Dr. Suzette Battiest (according to the patient). 4. Will scheduled chemotherapy teaching for the end of November 5. Will defer chemotherapy selection to Dr. Mariel Sleet. 6. Famvir 500 mg PO TID x 7 days, patient will call with any issues. 6. Return in 5 weeks for follow-up.     All questions were answered. The patient knows to call the clinic with any problems, questions or concerns. We can certainly see the patient much sooner if necessary.  Patient and plan will be discussed with Dr. Mariel Sleet in the near future.   KEFALAS,THOMAS

## 2012-07-13 ENCOUNTER — Other Ambulatory Visit (HOSPITAL_COMMUNITY): Payer: Self-pay | Admitting: Oncology

## 2012-07-14 ENCOUNTER — Other Ambulatory Visit (HOSPITAL_COMMUNITY): Payer: Self-pay | Admitting: Oncology

## 2012-07-14 ENCOUNTER — Encounter (HOSPITAL_COMMUNITY): Payer: Self-pay | Admitting: Pharmacy Technician

## 2012-07-20 ENCOUNTER — Encounter (HOSPITAL_COMMUNITY)
Admission: RE | Admit: 2012-07-20 | Discharge: 2012-07-20 | Payer: Medicaid Other | Source: Ambulatory Visit | Admitting: General Surgery

## 2012-07-22 ENCOUNTER — Ambulatory Visit (HOSPITAL_COMMUNITY): Payer: Medicaid Other | Admitting: Anesthesiology

## 2012-07-22 ENCOUNTER — Encounter (HOSPITAL_COMMUNITY): Payer: Self-pay | Admitting: *Deleted

## 2012-07-22 ENCOUNTER — Ambulatory Visit (HOSPITAL_COMMUNITY)
Admission: RE | Admit: 2012-07-22 | Discharge: 2012-07-22 | Disposition: A | Discharge status: Home / Self Care | Payer: Medicaid Other | Source: Ambulatory Visit | Attending: General Surgery | Admitting: General Surgery

## 2012-07-22 ENCOUNTER — Encounter (HOSPITAL_COMMUNITY): Payer: Self-pay | Admitting: Anesthesiology

## 2012-07-22 ENCOUNTER — Encounter (HOSPITAL_COMMUNITY)
Admission: RE | Disposition: A | Discharge status: Home / Self Care | Payer: Self-pay | Source: Ambulatory Visit | Attending: General Surgery

## 2012-07-22 ENCOUNTER — Ambulatory Visit (HOSPITAL_COMMUNITY): Payer: Medicaid Other

## 2012-07-22 DIAGNOSIS — C50919 Malignant neoplasm of unspecified site of unspecified female breast: Secondary | ICD-10-CM

## 2012-07-22 DIAGNOSIS — K219 Gastro-esophageal reflux disease without esophagitis: Secondary | ICD-10-CM | POA: Insufficient documentation

## 2012-07-22 HISTORY — PX: PORTACATH PLACEMENT: SHX2246

## 2012-07-22 LAB — SURGICAL PCR SCREEN: Staphylococcus aureus: POSITIVE — AB

## 2012-07-22 SURGERY — INSERTION, TUNNELED CENTRAL VENOUS DEVICE, WITH PORT
Anesthesia: Monitor Anesthesia Care | Site: Chest | Laterality: Left | Wound class: Clean

## 2012-07-22 MED ORDER — FENTANYL CITRATE 0.05 MG/ML IJ SOLN
INTRAMUSCULAR | Status: AC
  Filled 2012-07-22: qty 2

## 2012-07-22 MED ORDER — FENTANYL CITRATE 0.05 MG/ML IJ SOLN
50.0000 ug | Freq: Once | INTRAMUSCULAR | Status: AC
  Administered 2012-07-22: 50 ug via INTRAVENOUS

## 2012-07-22 MED ORDER — LIDOCAINE HCL (PF) 1 % IJ SOLN
INTRAMUSCULAR | Status: AC
  Filled 2012-07-22: qty 30

## 2012-07-22 MED ORDER — MIDAZOLAM HCL 2 MG/2ML IJ SOLN
INTRAMUSCULAR | Status: AC
Start: 2012-07-22 — End: 2012-07-22
  Filled 2012-07-22: qty 2

## 2012-07-22 MED ORDER — MIDAZOLAM HCL 5 MG/5ML IJ SOLN
INTRAMUSCULAR | Status: DC | PRN
  Administered 2012-07-22: 2 mg via INTRAVENOUS

## 2012-07-22 MED ORDER — ONDANSETRON HCL 4 MG/2ML IJ SOLN: 4.0000 mg | Freq: Once | INTRAMUSCULAR | Status: DC | PRN

## 2012-07-22 MED ORDER — PROPOFOL INFUSION 10 MG/ML OPTIME
INTRAVENOUS | Status: DC | PRN
  Administered 2012-07-22: 45 ug/kg/min via INTRAVENOUS

## 2012-07-22 MED ORDER — ENOXAPARIN SODIUM 40 MG/0.4ML ~~LOC~~ SOLN
40.0000 mg | Freq: Once | SUBCUTANEOUS | Status: AC
  Administered 2012-07-22: 40 mg via SUBCUTANEOUS

## 2012-07-22 MED ORDER — CEFAZOLIN SODIUM-DEXTROSE 2-3 GM-% IV SOLR: 2.0000 g | INTRAVENOUS | Status: DC

## 2012-07-22 MED ORDER — MIDAZOLAM HCL 2 MG/2ML IJ SOLN
INTRAMUSCULAR | Status: AC
  Filled 2012-07-22: qty 2

## 2012-07-22 MED ORDER — ALBUTEROL SULFATE (5 MG/ML) 0.5% IN NEBU
2.5000 mg | INHALATION_SOLUTION | Freq: Once | RESPIRATORY_TRACT | Status: AC
  Administered 2012-07-22: 2.5 mg via RESPIRATORY_TRACT

## 2012-07-22 MED ORDER — PROPOFOL 10 MG/ML IV EMUL
INTRAVENOUS | Status: AC
  Filled 2012-07-22: qty 20

## 2012-07-22 MED ORDER — CEFAZOLIN SODIUM-DEXTROSE 2-3 GM-% IV SOLR
INTRAVENOUS | Status: AC
  Filled 2012-07-22: qty 50

## 2012-07-22 MED ORDER — MUPIROCIN 2 % EX OINT
TOPICAL_OINTMENT | CUTANEOUS | Status: AC
  Filled 2012-07-22: qty 22

## 2012-07-22 MED ORDER — FENTANYL CITRATE 0.05 MG/ML IJ SOLN
INTRAMUSCULAR | Status: DC | PRN
  Administered 2012-07-22 (×2): 50 ug via INTRAVENOUS

## 2012-07-22 MED ORDER — SODIUM CHLORIDE 0.9 % IN NEBU
INHALATION_SOLUTION | RESPIRATORY_TRACT | Status: AC
Start: 2012-07-22 — End: 2012-07-22
  Filled 2012-07-22: qty 3

## 2012-07-22 MED ORDER — HEPARIN SODIUM (PORCINE) 1000 UNIT/ML IJ SOLN
INTRAMUSCULAR | Status: AC
Start: 2012-07-22 — End: 2012-07-22
  Filled 2012-07-22: qty 3

## 2012-07-22 MED ORDER — LACTATED RINGERS IV SOLN
INTRAVENOUS | Status: DC
  Administered 2012-07-22: 1000 mL via INTRAVENOUS

## 2012-07-22 MED ORDER — MUPIROCIN 2 % EX OINT
TOPICAL_OINTMENT | Freq: Two times a day (BID) | CUTANEOUS | Status: DC
  Administered 2012-07-22: 1 via NASAL

## 2012-07-22 MED ORDER — ENOXAPARIN SODIUM 40 MG/0.4ML ~~LOC~~ SOLN
SUBCUTANEOUS | Status: AC
  Filled 2012-07-22: qty 0.4

## 2012-07-22 MED ORDER — FENTANYL CITRATE 0.05 MG/ML IJ SOLN
25.0000 ug | INTRAMUSCULAR | Status: DC | PRN
  Administered 2012-07-22 (×2): 50 ug via INTRAVENOUS

## 2012-07-22 MED ORDER — MIDAZOLAM HCL 2 MG/2ML IJ SOLN
1.0000 mg | INTRAMUSCULAR | Status: DC | PRN
  Administered 2012-07-22: 2 mg via INTRAVENOUS

## 2012-07-22 MED ORDER — ALBUTEROL SULFATE (5 MG/ML) 0.5% IN NEBU
INHALATION_SOLUTION | RESPIRATORY_TRACT | Status: AC
  Filled 2012-07-22: qty 0.5

## 2012-07-22 MED ORDER — CEFAZOLIN SODIUM-DEXTROSE 2-3 GM-% IV SOLR
INTRAVENOUS | Status: DC | PRN
  Administered 2012-07-22: 2 g via INTRAVENOUS

## 2012-07-22 MED ORDER — SODIUM CHLORIDE 0.9 % IV SOLN
INTRAVENOUS | Status: DC | PRN
  Administered 2012-07-22: 1000 mL via INTRAMUSCULAR

## 2012-07-22 MED ORDER — HEPARIN SODIUM (PORCINE) 1000 UNIT/ML IJ SOLN
INTRAMUSCULAR | Status: DC | PRN
  Administered 2012-07-22: 3000 [IU] via INTRAVENOUS

## 2012-07-22 MED ORDER — LIDOCAINE HCL (PF) 1 % IJ SOLN
INTRAMUSCULAR | Status: DC | PRN
  Administered 2012-07-22: 30 mL via SUBCUTANEOUS

## 2012-07-22 SURGICAL SUPPLY — 41 items
APL SKNCLS STERI-STRIP NONHPOA (GAUZE/BANDAGES/DRESSINGS) ×1
APPLIER CLIP 9.375 SM OPEN (CLIP)
APR CLP SM 9.3 20 MLT OPN (CLIP)
BAG DECANTER FOR FLEXI CONT (MISCELLANEOUS) ×2 IMPLANT
BAG HAMPER (MISCELLANEOUS) ×2 IMPLANT
BENZOIN TINCTURE PRP APPL 2/3 (GAUZE/BANDAGES/DRESSINGS) ×2 IMPLANT
CATH HICKMAN DUAL 12.0 (CATHETERS) IMPLANT
CLIP APPLIE 9.375 SM OPEN (CLIP) IMPLANT
CLOTH BEACON ORANGE TIMEOUT ST (SAFETY) ×2 IMPLANT
CLSR STERI-STRIP ANTIMIC 1/2X4 (GAUZE/BANDAGES/DRESSINGS) ×1 IMPLANT
COVER LIGHT HANDLE STERIS (MISCELLANEOUS) ×4 IMPLANT
DECANTER SPIKE VIAL GLASS SM (MISCELLANEOUS) ×2 IMPLANT
DRAPE C-ARM FOLDED MOBILE STRL (DRAPES) ×2 IMPLANT
DURAPREP 6ML APPLICATOR 50/CS (WOUND CARE) ×2 IMPLANT
ELECT REM PT RETURN 9FT ADLT (ELECTROSURGICAL) ×2
ELECTRODE REM PT RTRN 9FT ADLT (ELECTROSURGICAL) ×1 IMPLANT
GLOVE BIOGEL PI IND STRL 7.5 (GLOVE) ×1 IMPLANT
GLOVE BIOGEL PI INDICATOR 7.5 (GLOVE) ×1
GLOVE ECLIPSE 7.0 STRL STRAW (GLOVE) ×2 IMPLANT
GOWN STRL REIN XL XLG (GOWN DISPOSABLE) ×4 IMPLANT
IV NS 500ML (IV SOLUTION) ×2
IV NS 500ML BAXH (IV SOLUTION) ×1 IMPLANT
KIT PORT POWER 8FR ISP MRI (CATHETERS) ×2 IMPLANT
KIT ROOM TURNOVER APOR (KITS) ×2 IMPLANT
MANIFOLD NEPTUNE II (INSTRUMENTS) ×2 IMPLANT
NDL HYPO 18GX1.5 BLUNT FILL (NEEDLE) ×1 IMPLANT
NDL HYPO 25X1 1.5 SAFETY (NEEDLE) ×1 IMPLANT
NEEDLE HYPO 18GX1.5 BLUNT FILL (NEEDLE) ×2 IMPLANT
NEEDLE HYPO 25X1 1.5 SAFETY (NEEDLE) ×2 IMPLANT
NS IRRIG 1000ML POUR BTL (IV SOLUTION) ×2 IMPLANT
PACK MINOR (CUSTOM PROCEDURE TRAY) ×2 IMPLANT
PAD ARMBOARD 7.5X6 YLW CONV (MISCELLANEOUS) ×2 IMPLANT
SET BASIN LINEN APH (SET/KITS/TRAYS/PACK) ×2 IMPLANT
SET INTRODUCER 12FR PACEMAKER (SHEATH) IMPLANT
SHEATH COOK PEEL AWAY SET 8F (SHEATH) IMPLANT
STRIP CLOSURE SKIN 1/2X4 (GAUZE/BANDAGES/DRESSINGS) ×2 IMPLANT
SUT MNCRL AB 4-0 PS2 18 (SUTURE) ×2 IMPLANT
SUT VIC AB 3-0 SH 27 (SUTURE) ×2
SUT VIC AB 3-0 SH 27X BRD (SUTURE) ×1 IMPLANT
SYR CONTROL 10ML LL (SYRINGE) ×2 IMPLANT
SYRINGE 10CC LL (SYRINGE) ×2 IMPLANT

## 2012-07-22 NOTE — Anesthesia Postprocedure Evaluation (Signed)
  Anesthesia Post-op Note  Patient: Elizabeth Orr  Procedure(s) Performed: Procedure(s) (LRB) with comments: INSERTION PORT-A-CATH (Left) - left subclavian  Patient Location: PACU  Anesthesia Type:MAC  Level of Consciousness: awake, alert , oriented and patient cooperative  Airway and Oxygen Therapy: Patient Spontanous Breathing  Post-op Pain: none  Post-op Assessment: Post-op Vital signs reviewed, Patient's Cardiovascular Status Stable, Respiratory Function Stable, Patent Airway, No signs of Nausea or vomiting and Pain level controlled  Post-op Vital Signs: Reviewed and stable  Complications: No apparent anesthesia complications

## 2012-07-22 NOTE — Anesthesia Preprocedure Evaluation (Signed)
Anesthesia Evaluation  Patient identified by MRN, date of birth, ID band Patient awake    Reviewed: Allergy & Precautions, H&P , NPO status , Patient's Chart, lab work & pertinent test results  History of Anesthesia Complications Negative for: history of anesthetic complications  Airway Mallampati: II      Dental  (+) Poor Dentition, Chipped and Missing   Pulmonary shortness of breath,  Sever GERD probable silent aspiration at night...chronic cough    + wheezing      Cardiovascular negative cardio ROS  Rhythm:Regular Rate:Normal     Neuro/Psych    GI/Hepatic GERD-  Poorly Controlled,  Endo/Other    Renal/GU Renal disease (stoneskidney stones)     Musculoskeletal   Abdominal   Peds  Hematology   Anesthesia Other Findings   Reproductive/Obstetrics                           Anesthesia Physical Anesthesia Plan  ASA: III  Anesthesia Plan: MAC   Post-op Pain Management:    Induction: Intravenous  Airway Management Planned: Nasal Cannula  Additional Equipment:   Intra-op Plan:   Post-operative Plan:   Informed Consent: I have reviewed the patients History and Physical, chart, labs and discussed the procedure including the risks, benefits and alternatives for the proposed anesthesia with the patient or authorized representative who has indicated his/her understanding and acceptance.     Plan Discussed with:   Anesthesia Plan Comments:         Anesthesia Quick Evaluation

## 2012-07-22 NOTE — Op Note (Signed)
Patient:  Elizabeth Orr  DOB:  11/01/1960  MRN:  [id number]   Preop Diagnosis:  Breast cancer  Postop Diagnosis:  Same  Procedure:  Left subclavian vein Port-A-Cath insertion  Surgeon:  Dr. Tilford Pillar  Anes:  MAC sedation, 1% lidocaine plain for local  Indications:  Patient is a 51 year old female well-known to me with a history of right-sided breast cancer. She is scheduled to start chemotherapy and port placement was discussed. Risks benefits alternatives including but not limited to risk of bleeding, infection, pneumothorax were discussed with the patient. Consent is obtained.  Procedure note:  Patient is taken to the operating room was placed into a supine position the or table time the sedation is a Optician, dispensing. This point her left chest and neck were prepped with DuraPrep solution and draped in standard fashion. Patient's placed into a Trendelenburg position. Local anesthetic is instilled along the planned course of venipuncture. An 18-gauge introducer needle utilized to identify the left subclavian vein. Good venous return was obtained. J-wire was advanced. Fluoroscopy was utilized confirm the course of the J-wire into the superior vena cava. The J-wire was clamped to the drapes. At this time the subcutaneous pocket was created using again the local anesthetic and a 15 blade scalpel to create the initial skin incision. The pocket was enlarged with a combination of blunt digital dissection and electric cautery. Upon adequately sized in the pocket the catheter is advanced to the subcuticular tissue using a tunneling device. At this point the insertion sheath combination was advanced down the J-wire under visualization with fluoroscopy. The course of the insertion sheath was smooth without any difficulties. The J-wire and dilator removed in standard fashion. The catheter is advanced. Upon confirmation that it is in the superior vena cava the insertion sheath was split and removed in standard  fashion. At this time the catheter is trimmed at 20 cm and attached to the port. The port was inserted into the pocket and secured to the deep prepectoralis fascia with a 3-0 Vicryl. The port is accessed with a Huber needle and good venous return was obtained. The port is instilled with 3000 units of heparin. The course of the catheter is visualized with fluoroscopy and there is no noted sharp angulation or kinking. At this point the deep subcuticular pocket was reapproximated using a 3-0 Vicryl and running continuous fashion. The skin edges at both incision sites are closed with a 4-0 Monocryl in a running subcutaneous close suture. The skin was washed dried moist dry towel. Benzoin is applied around the incisions. Half-inch are suture placed. The drapes removed patient left come out of the patient and stretcher the PACU in stable condition. At the conclusion of procedure all instrument, sponge, needle counts are correct. Patient tolerated procedure extremely well.  Complications:  None apparent  EBL:  Minimal  Specimen:  None

## 2012-07-22 NOTE — Transfer of Care (Signed)
Immediate Anesthesia Transfer of Care Note  Patient: Elizabeth Orr  Procedure(s) Performed: Procedure(s) (LRB) with comments: INSERTION PORT-A-CATH (Left) - left subclavian  Patient Location: PACU  Anesthesia Type:MAC  Level of Consciousness: awake, alert , oriented and patient cooperative  Airway & Oxygen Therapy: Patient Spontanous Breathing and Patient connected to face mask oxygen  Post-op Assessment: Report given to PACU RN, Post -op Vital signs reviewed and stable and Patient moving all extremities  Post vital signs: Reviewed and stable  Complications: No apparent anesthesia complications

## 2012-07-22 NOTE — H&P (Signed)
NTS SOAP Note  Vital Signs:  Vitals as of: 05/26/2012: Systolic 160: Diastolic 85: Heart Rate 117: Temp 98.34F: Height 35ft 2in: Weight 232Lbs 0 Ounces: Pain Level 9: BMI 42  BMI : 42.43 kg/m2  Subjective: This 51 Years 62 Months old Female presents for of mass in the right breast. Patient noted changes on self breast exam. She was evaluated at the health department and was sent for mammogram and ultrasound. These changes, mammogram patient was referred for surgical evaluation. Patient states she's noted is increase in the size of the mass of the last several months. She didn't denies any skin changes. No erythema. No nipple changes. No nipple discharge. She denies any significant fevers or chills. She has been extremely anxious. She denies any family history. No significant weight changes. No similar lesions in the past.  Review of Symptoms:    anxious    headaches Eyes:unremarkable   Nose/Mouth/Throat:unremarkable Cardiovascular:  unremarkable   Respiratory:unremarkable   Gastrointestinal:  unremarkable   Genitourinary:unremarkable       arthralgias Skin:unremarkable   as per history of present illness Hematolgic/Lymphatic:unremarkable     Allergic/Immunologic:unremarkable     Past Medical History:  Obtained     Past Medical History  Surgical History: tubal ligation Medical Problems: none Psychiatric History: none Allergies: ibuprofen Medications: none   Social History:Obtained  Social History  Preferred Language: Spanish ([place]) Race:  Native Hawaiian or Other Pacific Islander Ethnicity: Not Hispanic / Latino Age: 51 Years 9 Months Marital Status:  M Alcohol: none Recreational drug(s): none   Smoking Status: Never smoker reviewed on 06/01/2012  Family History:Obtained     Family History  Is there a family history ZO:[id number]. No family history of breast cancer, ovarian, or uterine cancers. No prostate  cancer history.    Objective Information: General:  Well appearing, well nourished in no distress. anxious Skin:     no rash or prominent lesions Head:Atraumatic; no masses; no abnormalities Eyes:  conjunctiva clear, EOM intact, PERRL Mouth:  Mucous membranes moist, no mucosal lesions. Neck:  Supple without lymphadenopathy.  Heart:  RRR, no murmur Lungs:    CTA bilaterally, no wheezes, rhonchi, rales.  Breathing unlabored.       palpable large mass within the right breast at approximately 12:00 position. Also a small are noted lateral lesionIn the right breast. No nipple retraction. No skin dimpling.  no axillary lymphadenopathy. Abdomen:Soft, NT/ND, no HSM, no masses. Extremities:  No deformities, clubbing, cyanosis, or edema.    mammogram/ultrasound: abnormal mass within the right breast x3. 2 these are in close approximation based on mammogram and ultrasound findings. The third is lateral. Given the findings patient was given a BI-RADS score of 5   Assessment:    Plan: right breast mass. At this time options were discussed at length the patient and family. Less invasive techniques such as stereotactic biopsy or ultrasound-guided biopsy were discussed with with the patient. Patient is adamant that she wishes to be asleep during any procedure.she understands the increase in dizziness of a surgical biopsy but does wish to proceed. She understands the risk of bleeding, infection, as well as the likely need for additional surgeries. She understands as does the family that this is to obtain additional information and is not a definitive therapy should this truly be identified as a this will be scheduled at her earliest convenience as soon as possible.  Patient Education:Alternative treatments to surgery were discussed with patient (and family).  Risks and benefits  of procedure were  fully explained to the patient (and family) who gave informed consent.  Patient/family questions were addressed.  Follow-up:Pending Surgery   Patient Name: Laidlaw Date of Birth: 1960/08/28  Vital Signs:  Insert Vitals as of: 06/25/2012: Systolic 140: Diastolic 89: Heart Rate 96: Temp 35.89C: Height 157CM: Weight 99.79KG: BMI 40   BMI: 40.24 kg/m2  Subjective: This 51 Years 29 Months old Female presents for surgery followup.  S/p Right MRM Patient has    significant complaints.Skin edges opened.  Drainage down.  Pain better.  No fever or chills.   Social History:   Social History  Preferred Language: Spanish ([place]) Race:  Native Hawaiian or Other Pacific Islander Ethnicity: Not Hispanic / Latino Age: 51 Years 9 Months Marital Status:  M Alcohol: none Recreational drug(s): none       Allergies:  Allergies Insert Code: No allergies found.     Objective: General: Well appearing, well nourished in no distress. Incision healing moderately well.  mid-lateral skin dehisence.  Minimal drainage.  No odor.  No purulence.  + granulation tissue.   Assessment:     PO ZOX:WRUEAVWUJW stable  Plan:  Reassured patient.  Majority of incision looks ok.  Continue local care.  Patient to call with issues in interim.    Add H2O2 to regiment.  Follow-up:Weeks 1

## 2012-07-22 NOTE — Interval H&P Note (Signed)
History and Physical Interval Note:  07/22/2012 7:52 AM  Elizabeth Orr  has presented today for surgery, with the diagnosis of Breast Cancer  The various methods of treatment have been discussed with the patient and family. After consideration of risks, benefits and other options for treatment, the patient has consented to  Procedure(s) (LRB) with comments: INSERTION PORT-A-CATH (N/A) as a surgical intervention .  The patient's history has been reviewed, patient examined, no change in status, stable for surgery.  I have reviewed the patient's chart and labs.  Questions were answered to the patient's satisfaction.     Jodey Burbano C

## 2012-07-24 NOTE — Progress Notes (Signed)
Pt. Called to Short Stay requesting more pain medicine.  Pt. States that the pain medicine MD prescribed for her after surgery  isn't working.  Pt. 's speech was garbled & slurred.  Told pt. To call MD's office & inform MD or come to the emergency room for evaluation.

## 2012-07-27 ENCOUNTER — Encounter (HOSPITAL_COMMUNITY): Payer: Self-pay | Admitting: General Surgery

## 2012-07-27 NOTE — Patient Instructions (Addendum)
[facility]   CHEMOTHERAPY INSTRUCTIONS    Adriamycin - bone marrow suppression, nausea, vomiting, hair loss, mouth sores, cardiotoxicity (this is why we do the 2D Echoes), sensitivity to light, will turn urine red for a couple of days after receiving it. You will take Adriamycin and Cytoxan together every 14 days @ the Cancer Center for 4 total treatments.   Cytoxan is a chemotherapy drug. Cytoxan can cause nausea and vomiting and of course hair loss. It can also cause hemorrhagic cystitis (bloody urine) because the chemo irritates your bladder. You need to push fluids when on this chemo, preferably water (64 oz a day). Do not hold your urine. Urinate before you go to bed and if you wake up during the night go to the bathroom. You will take Adriamycin and Cytoxan together every 14 days @ the Cancer Center for 4 total treatments.  Taxol - the first time you receive this drug we will titrate it very slowly to ensure that you do not have or are not having an allergic reaction to the chemo. You will also be responsible for taking a steroid called Dexamethasone before and after chemo. This is to reduce your risk of having an allergic reaction to the Taxol. Side Effects: hair loss, lowers your white blood cells (fight infection), muscle aches, nausea/vomiting, irritation to the mouth (mouth sores, pain in your mouth) *neuropathy - numbness/tingling/burning in hands/fingers/feet/toes. We need to know as soon as this begins to happen so that we can monitor it and treat if necessary. The numbness generally begins in the fingertips of tips of toes and then begins to travel up the finger/toe/hand/foot. We never want you getting to where you can't pick up a pen, coin, zip a zipper, button a button, or have trouble walking. You must tell us immediately if you are experiencing peripheral neuropathy!  You will take Taxol weekly @ the Cancer Center for 12 treatments. This will begin 2  to 3 weeks after your Adriamycin and Cytoxan treatments.   Neulasta - this is not chemo but being given to you because you have had chemo. This medication works by stimulating your bone marrow which is inside of your bones to produce more white blood cells. White blood cells protect [place] from infection. Chemo destroys your cancer cells and white blood cells and that is why you need the Neulasta. This medication will be given to you by a nurse in the Cancer Center. It is given 20-24 hours from the completion of chemo. It comes in the form of an injection. It will be administered into the fatty tissue of your abdomen. The most common side effect associated with this injection is bone pain. The bone pain can occur as soon as a few hours after the injection to several days after the injection. Some people have mild bone pain and some have none. Few people experience severe bone pain. We want and need to know how you respond to this injection. If you develop moderate to severe bone pain - we need to know! You may also apply a heating pad to the affected areas.   POTENTIAL SIDE EFFECTS OF TREATMENT: Increased Susceptibility to Infection, Vomiting, Constipation, Red or Pink Urine (with Adriamycin), Hair Thinning, Changes in Character of Skin and Nails (brittleness, dryness,etc.), Bone Marrow Suppression, Abdominal Cramping can occur with Taxol treatments, Complete Hair Loss, Nausea and Diarrhea   SELF IMAGE NEEDS AND REFERRALS MADE: Obtain hair accessories as soon as possible (wigs, scarves,  turbans,caps,etc.) and Referral to Look Good, Feel Better consultant   EDUCATIONAL MATERIALS GIVEN AND REVIEWED: Chemotherapy and You Specific Instruction Sheets on: Adriamycin, Cytoxan, Taxol, Dexamethasone, Aloxi, Emend, Zofran, Ativan, Compazine, EMLA cream Med and chemo calendar given to patient.   SELF CARE ACTIVITIES WHILE ON CHEMOTHERAPY: Increase your fluid intake 48 hours prior to treatment and drink at least  2 quarts per day after treatment., No alcohol intake., No aspirin or other medications unless approved by your oncologist., Eat foods that are light and easy to digest., Eat foods at cold or room temperature., No fried, fatty, or spicy foods immediately before or after treatment., Have teeth cleaned professionally before starting treatment. Keep dentures and partial plates clean., Use soft toothbrush and do not use mouthwashes that contain alcohol. Biotene is a good mouthwash that is available at most pharmacies or may be ordered by calling [phone]., Use warm salt water gargles (1 teaspoon salt per 1 quart warm water) before and after meals and at bedtime. Or you may rinse with 2 tablespoons of three -percent hydrogen peroxide mixed in eight ounces of water., Always use sunscreen with SPF (Sun Protection Factor) of 30 or higher., Use your nausea medication as directed to prevent nausea., Use your stool softener or laxative as directed to prevent constipation. and Use your anti-diarrheal medication as directed to stop diarrhea.  Please wash your hands for at least 30 seconds using warm soapy water. Handwashing is the #1 way to prevent the spread of germs. Stay away from sick people or people who are getting over a cold. If you develop respiratory systems such as green/yellow mucus production or productive cough or persistent cough let us know and we will see if you need an antibiotic. It is a good idea to keep a pair of gloves on when going into grocery stores/[employer] to decrease your risk of coming into contact with germs on the carts, etc. Carry alcohol hand gel with you at all times and use it frequently if out in public. All foods need to be cooked thoroughly. No raw foods. No medium or undercooked meats, eggs. If your food is cooked medium well, it does not need to be hot pink or saturated with bloody liquid at all. Vegetables and fruits need to be washed/rinsed under the faucet with a dish detergent  before being consumed. You can eat raw fruits and vegetables unless we tell you otherwise but it would be best if you cooked them or bought frozen. Do not eat off of salad bars or hot bars unless you really trust the cleanliness of the restaurant. If you need dental work, please let Dr. Mariel Sleet know before you go for your appointment so that we can coordinate the best possible time for you in regards to your chemo regimen. You need to also let your dentist know that you are actively taking chemo. We may need to do labs prior to your dental appointment. We also want your bowels moving at least every other day. If this is not happening, we need to know so that we can get you on a bowel regimen to help you go.     MEDICATIONS: You have been given prescriptions for the following medications:  Dexamethasone 4mg  tablet. Starting the day after Adriamycin and Cytoxan chemo, take 2 tablets in the am and 2 tablets in the pm for 2 days. Repeat after each chemo cycle.  Dexamethasone 4mg  tablet. When taking Taxol chemotherapy...Marland KitchenMarland KitchenAt 9pm the night before chemo, take 2 tablets. Then  take 2 tablets @ 3am the morning of chemo. Then starting the day after Taxol chemo, take 2 tablets in the am for 2 days. Repeat this after each chemo cycle. (you will not start this until you have completed your Adriamycin & Cytoxan chemo)  Zofran 8mg  tablet. Starting the 3rd day after Adriamycin and Cytoxan chemo, take 1 tablet two times a day IF needed for nausea and vomiting.   Zofran 8mg  tablet. Starting the day after Taxol chemo, take 1 tablet in the am and 1 tablet in the pm for 2 days. Then only take two times a day IF needed for nausea/vomiting. (You will not start this until you have complete your Adriamycin & Cytoxan chemo)  Ativan 1mg  tablet. Take 1 tablet every 4 hours IF needed for nausea or vomiting. Must take this and Xanax 2 hours apart. Do not take Ativan and Xanax together!    Compazine 25mg  suppository. Insert 1  rectally every 6 hours IF needed for nausea or vomiting.  EMLA cream. Apply a quarter sized amount to port site 1 hour prior to chemo. Do not rub in. Cover with plastic.   OVER-the-COUNTER  Colace - this is a stool softener. Take 100mg  capsule 2-6 times a day as needed. If you have to take more than 6 capsules of Colace a day call the Cancer Center.  Senna - this is a mild laxative used to treat mild constipation. May take 2 tabs by mouth daily or up to twice a day as needed for mild constipation.  Milk of Magnesia - this is a laxative used to treat moderate to severe constipation. May take 2-4 tablespoons every 8 hours as needed. May increase to 8 tablespoons x 1 dose and if no bowel movement call the Cancer Center.  Imodium - this is for diarrhea. Take 2 tabs after 1st loose stool and then 1 tab after each loose stool until you go a total of 12 hours without a loose stool. Call Cancer Center if loose stools continue.    SYMPTOMS TO REPORT AS SOON AS POSSIBLE AFTER TREATMENT:  FEVER GREATER THAN 100.5 F  CHILLS WITH OR WITHOUT FEVER  NAUSEA AND VOMITING THAT IS NOT CONTROLLED WITH YOUR NAUSEA MEDICATION  UNUSUAL SHORTNESS OF BREATH  UNUSUAL BRUISING OR BLEEDING  TENDERNESS IN MOUTH AND THROAT WITH OR WITHOUT PRESENCE OF ULCERS  URINARY PROBLEMS  BOWEL PROBLEMS  UNUSUAL RASH    Wear comfortable clothing and clothing appropriate for easy access to any Portacath or PICC line. Let us know if there is anything that we can do to make your therapy better!      I have been informed and understand all of the instructions given to me and have received a copy. I have been instructed to call the clinic [phone] or my family physician as soon as possible for continued medical care, if indicated. I do not have any more questions at this time but understand that I may call the Cancer Center or the Patient Navigator at [phone] during office hours should I have questions or  need assistance in obtaining follow-up care.      _________________________________________      _______________     __________ Signature of Patient or Authorized Representative        Date                            Time      _________________________________________  Nurse's Signature

## 2012-07-29 ENCOUNTER — Inpatient Hospital Stay (HOSPITAL_COMMUNITY): Payer: Medicaid Other

## 2012-07-29 ENCOUNTER — Encounter (HOSPITAL_COMMUNITY): Payer: Medicaid Other | Attending: Oncology

## 2012-07-29 ENCOUNTER — Ambulatory Visit (HOSPITAL_COMMUNITY): Payer: Medicaid Other

## 2012-07-29 ENCOUNTER — Ambulatory Visit (HOSPITAL_COMMUNITY)
Admission: RE | Admit: 2012-07-29 | Discharge: 2012-07-29 | Disposition: A | Discharge status: Home / Self Care | Payer: Medicaid Other | Source: Ambulatory Visit | Attending: Cardiology | Admitting: Cardiology

## 2012-07-29 DIAGNOSIS — C50919 Malignant neoplasm of unspecified site of unspecified female breast: Secondary | ICD-10-CM | POA: Insufficient documentation

## 2012-07-29 DIAGNOSIS — I517 Cardiomegaly: Secondary | ICD-10-CM | POA: Insufficient documentation

## 2012-07-29 NOTE — Progress Notes (Unsigned)
Will continue with chemo teaching tomorrow before chemotherapy appt. Patient arrived late for appt and I didn't have time to go over all instructions. Patient will also sign consent before chemotherapy on 07/30/12. Meds faxed into [facility] the other day and they did confirm that they received it via phone call.

## 2012-07-29 NOTE — Progress Notes (Signed)
*  PRELIMINARY RESULTS* Echocardiogram 2D Echocardiogram has been performed.  Elizabeth Orr 07/29/2012, 11:58 AM

## 2012-07-30 ENCOUNTER — Encounter (HOSPITAL_BASED_OUTPATIENT_CLINIC_OR_DEPARTMENT_OTHER): Payer: Medicaid Other

## 2012-07-30 VITALS — BP 127/85 | HR 98 | Temp 97.6°F | Resp 20 | Wt 218.0 lb

## 2012-07-30 DIAGNOSIS — C50219 Malignant neoplasm of upper-inner quadrant of unspecified female breast: Secondary | ICD-10-CM

## 2012-07-30 DIAGNOSIS — C50419 Malignant neoplasm of upper-outer quadrant of unspecified female breast: Secondary | ICD-10-CM

## 2012-07-30 DIAGNOSIS — C50919 Malignant neoplasm of unspecified site of unspecified female breast: Secondary | ICD-10-CM

## 2012-07-30 DIAGNOSIS — Z5111 Encounter for antineoplastic chemotherapy: Secondary | ICD-10-CM

## 2012-07-30 DIAGNOSIS — C773 Secondary and unspecified malignant neoplasm of axilla and upper limb lymph nodes: Secondary | ICD-10-CM

## 2012-07-30 LAB — CBC WITH DIFFERENTIAL/PLATELET
Basophils Absolute: 0 10*3/uL (ref 0.0–0.1)
Basophils Relative: 0 % (ref 0–1)
Eosinophils Absolute: 0.4 10*3/uL (ref 0.0–0.7)
Eosinophils Relative: 4 % (ref 0–5)
HCT: 33 % — ABNORMAL LOW (ref 36.0–46.0)
MCH: 23.6 pg — ABNORMAL LOW (ref 26.0–34.0)
MCHC: 28.5 g/dL — ABNORMAL LOW (ref 30.0–36.0)
Monocytes Absolute: 0.6 10*3/uL (ref 0.1–1.0)
Monocytes Relative: 6 % (ref 3–12)
Neutro Abs: 6.4 10*3/uL (ref 1.7–7.7)
RDW: 19.9 % — ABNORMAL HIGH (ref 11.5–15.5)

## 2012-07-30 LAB — COMPREHENSIVE METABOLIC PANEL
AST: 20 U/L (ref 0–37)
Albumin: 2.8 g/dL — ABNORMAL LOW (ref 3.5–5.2)
BUN: 5 mg/dL — ABNORMAL LOW (ref 6–23)
Calcium: 8.6 mg/dL (ref 8.4–10.5)
Chloride: 100 mEq/L (ref 96–112)
Creatinine, Ser: 0.51 mg/dL (ref 0.50–1.10)
Total Protein: 7.2 g/dL (ref 6.0–8.3)

## 2012-07-30 MED ORDER — SODIUM CHLORIDE 0.9 % IV SOLN: 150.0000 mg | Freq: Once | INTRAVENOUS | Status: DC

## 2012-07-30 MED ORDER — SODIUM CHLORIDE 0.9 % IV SOLN
Freq: Once | INTRAVENOUS | Status: AC
  Administered 2012-07-30: 10:00:00 via INTRAVENOUS

## 2012-07-30 MED ORDER — SODIUM CHLORIDE 0.9 % IV SOLN
Freq: Once | INTRAVENOUS | Status: AC
  Administered 2012-07-30: 10:00:00 via INTRAVENOUS
  Filled 2012-07-30: qty 5

## 2012-07-30 MED ORDER — DOXORUBICIN HCL CHEMO IV INJECTION 2 MG/ML
60.0000 mg/m2 | Freq: Once | INTRAVENOUS | Status: AC
  Administered 2012-07-30: 122 mg via INTRAVENOUS
  Filled 2012-07-30: qty 61

## 2012-07-30 MED ORDER — HEPARIN SOD (PORK) LOCK FLUSH 100 UNIT/ML IV SOLN
INTRAVENOUS | Status: AC
  Filled 2012-07-30: qty 5

## 2012-07-30 MED ORDER — SODIUM CHLORIDE 0.9 % IV SOLN
600.0000 mg/m2 | Freq: Once | INTRAVENOUS | Status: AC
  Administered 2012-07-30: 1220 mg via INTRAVENOUS
  Filled 2012-07-30: qty 61

## 2012-07-30 MED ORDER — PALONOSETRON HCL INJECTION 0.25 MG/5ML
INTRAVENOUS | Status: AC
  Filled 2012-07-30: qty 5

## 2012-07-30 MED ORDER — PALONOSETRON HCL INJECTION 0.25 MG/5ML
0.2500 mg | Freq: Once | INTRAVENOUS | Status: AC
  Administered 2012-07-30: 0.25 mg via INTRAVENOUS

## 2012-07-30 MED ORDER — DEXAMETHASONE SODIUM PHOSPHATE 4 MG/ML IJ SOLN: 12.0000 mg | Freq: Once | INTRAMUSCULAR | Status: DC

## 2012-07-30 MED ORDER — HEPARIN SOD (PORK) LOCK FLUSH 100 UNIT/ML IV SOLN
500.0000 [IU] | Freq: Once | INTRAVENOUS | Status: AC | PRN
  Administered 2012-07-30: 500 [IU]
  Filled 2012-07-30: qty 5

## 2012-07-30 NOTE — Progress Notes (Signed)
Pt tolerated 1st chemo infusion well.

## 2012-07-30 NOTE — Progress Notes (Signed)
Completed chemo teaching this am. Patient verbalized understanding. Husband present with patient this am.   Chemo teaching done and consent signed for Adriamycin, Cytoxan, and Taxol.

## 2012-07-31 ENCOUNTER — Encounter (HOSPITAL_BASED_OUTPATIENT_CLINIC_OR_DEPARTMENT_OTHER): Payer: Medicaid Other

## 2012-07-31 VITALS — BP 153/99 | HR 98 | Temp 97.9°F | Resp 20

## 2012-07-31 DIAGNOSIS — C50219 Malignant neoplasm of upper-inner quadrant of unspecified female breast: Secondary | ICD-10-CM

## 2012-07-31 DIAGNOSIS — C773 Secondary and unspecified malignant neoplasm of axilla and upper limb lymph nodes: Secondary | ICD-10-CM

## 2012-07-31 DIAGNOSIS — C50419 Malignant neoplasm of upper-outer quadrant of unspecified female breast: Secondary | ICD-10-CM

## 2012-07-31 DIAGNOSIS — C50919 Malignant neoplasm of unspecified site of unspecified female breast: Secondary | ICD-10-CM

## 2012-07-31 LAB — CANCER ANTIGEN 27.29: CA 27.29: 19 U/mL (ref 0–39)

## 2012-07-31 MED ORDER — PEGFILGRASTIM INJECTION 6 MG/0.6ML
6.0000 mg | Freq: Once | SUBCUTANEOUS | Status: AC
  Administered 2012-07-31: 6 mg via SUBCUTANEOUS

## 2012-07-31 MED ORDER — PEGFILGRASTIM INJECTION 6 MG/0.6ML
SUBCUTANEOUS | Status: AC
  Filled 2012-07-31: qty 0.6

## 2012-07-31 NOTE — Progress Notes (Signed)
Elizabeth Orr presents today for injection per MD orders. Neulasta 6mg  administered SQ in left Abdomen. Administration without incident. Patient tolerated well.

## 2012-07-31 NOTE — Progress Notes (Signed)
Patient is doing fine since taking her first chemo treatment yday. She reports that she has energy today. I reminded patient that she probably has this energy due to the steroids from yday via IV and the steroids that she took this am. I told her to not be surprised if by Sunday she starting feeling tired. Patient aware of how to take her nausea meds and steroid tablet. Patient has appt set up with Dr. Luvenia Starch office for next week regarding her esophagus.

## 2012-08-03 ENCOUNTER — Other Ambulatory Visit (HOSPITAL_COMMUNITY): Payer: Self-pay | Admitting: Oncology

## 2012-08-03 DIAGNOSIS — C50919 Malignant neoplasm of unspecified site of unspecified female breast: Secondary | ICD-10-CM

## 2012-08-03 MED ORDER — OXYCODONE-ACETAMINOPHEN 5-325 MG PO TABS: 1.0000 | ORAL_TABLET | ORAL | Status: DC | PRN

## 2012-08-05 ENCOUNTER — Encounter (HOSPITAL_COMMUNITY): Payer: Self-pay | Admitting: *Deleted

## 2012-08-06 ENCOUNTER — Encounter: Payer: Self-pay | Admitting: Urgent Care

## 2012-08-06 ENCOUNTER — Ambulatory Visit (INDEPENDENT_AMBULATORY_CARE_PROVIDER_SITE_OTHER): Payer: Medicaid Other | Admitting: Urgent Care

## 2012-08-06 VITALS — BP 132/84 | HR 108 | Temp 97.4°F | Ht 61.0 in | Wt 219.6 lb

## 2012-08-06 DIAGNOSIS — R1310 Dysphagia, unspecified: Secondary | ICD-10-CM

## 2012-08-06 DIAGNOSIS — K921 Melena: Secondary | ICD-10-CM

## 2012-08-06 MED ORDER — PEG 3350-KCL-NA BICARB-NACL 420 G PO SOLR: 4000.0000 mL | ORAL | Status: DC

## 2012-08-06 MED ORDER — OMEPRAZOLE 20 MG PO CPDR: 20.0000 mg | DELAYED_RELEASE_CAPSULE | Freq: Every day | ORAL | Status: DC

## 2012-08-06 NOTE — Patient Instructions (Addendum)
EGD (upper endoscopy) with colonoscopy with Dr. Jena Gauss. He may decide to stretch your esophagus at the same time. Stop Zantac Begin omeprazole 20 mg daily

## 2012-08-06 NOTE — Progress Notes (Signed)
Referring Provider:  Dr. Mariel Sleet  Primary Care Physician:  [name], NP Primary Gastroenterologist:  Dr. Jena Gauss  Chief Complaint  Patient presents with  . Dysphagia    HPI:  Elizabeth Orr is a 51 y.o. female here as a referral from Dr.Neijstrom for Dysphagia.  She reports a long-standing history of chronic dysphagia. Recently over the last year she's had progressive dysphasia with only solid foods and pills. She denies any problems with liquids. She feels that food is stuck in her lower esophagus. She has episodes where she feels that she is "getting choked". She has been recently diagnosed with breast cancer and is undergoing chemotherapy status post mastectomy under the direction of Dr. Mariel Sleet. She is taking omeprazole 20mg  BID for heartburn and feels this is well controlled.  He is also recently started Carafate 1 g QID 10 days ago & it seems to help with her swallowing. Her next chemotherapy scheduled for 12/27.  She has received one treatment on 12/12.  Last EGD 10/04/2005 by Dr. Jena Gauss she had a large bulbar ulcer without bleeding stigmata, multiple gastric ulcers, and extensive geographic ulcerations the distal third of her tubular esophagus. She also had early stricture formation that was dilated with a 30 [place] Mal[place]. Swallowing did seem to get better after this dilation.  She is also requesting a colonoscopy today.  She reports small to moderate amount of burgundy blood in her stool with wiping. She denies any abdominal pain denies any nausea, vomiting, or anorexia. She denies any diarrhea or constipation.  Past Medical History  Diagnosis Date  . Kidney stones 1989  . GERD (gastroesophageal reflux disease)   . Arthritis   . History of stomach ulcers 2003    Hx of esophageal ulcers and stomach ulcers due to reflux  . Breast cancer 06/08/12    right mastectomy  . Back pain   . Infiltrating ductal carcinoma of breast 07/09/2012    Neijstrom:  chemo  . Chronic leg pain      Past Surgical History  Procedure Date  . Esophagogastroduodenoscopy   . Tubal ligation 1989  . Breast surgery     breast biopsy-right  . Mastectomy 06/08/12    right  . Esophageal dilation   . Portacath placement 07/22/2012    Procedure: INSERTION PORT-A-CATH;  Surgeon: Fabio Bering, MD;  Location: [facility];  Service: General;  Laterality: Left;  left subclavian  . Esophagogastroduodenoscopy (egd) with esophageal dilation 10/04/2005    Rourk-Extensive geographic ulcerations distal third of the tubular esoophagus consistent with severe ulcerative relux esophagitis, early stricture formation status post dilation as described above. 2.  Multiple gastric ulcers without bleeding stigmata as described above. otherwise normal stomach.  3. large bulbar ulcer without bleeding stigmata. otherwise D1 and D2 appeared normal.    Current Outpatient Prescriptions  Medication Sig Dispense Refill  . albuterol (PROVENTIL HFA;VENTOLIN HFA) 108 (90 BASE) MCG/ACT inhaler Inhale 2 puffs into the lungs every 6 (six) hours as needed for wheezing.  1 Inhaler  2  . ALPRAZolam (XANAX) 0.25 MG tablet Take 0.25 mg by mouth 3 (three) times daily as needed.      . cyclophosphamide (CYTOXAN) 2 G chemo injection Inject into the vein every 14 (fourteen) days.      Marland Kitchen dexamethasone (DECADRON) 4 MG tablet Take 8 mg by mouth. Starting the day after chemo, take 2 tablets in the am and 2 tablets in the pm for 2 days. Then stop. Repeat this after each cycle of chemo.  When starting Taxol chemotherapy, you will take 2 tablets @ 9pm the night before chemo then 2 tablets @ 3am the morning of chemo. Then starting the day after Taxol chemo, take 2 tablets in the am for 2 days. Repeat this with each treatment.      Marland Kitchen DOXOrubicin HCl (ADRIAMYCIN IV) Inject into the vein every 14 (fourteen) days.      . famciclovir (FAMVIR) 500 MG tablet Take 1 tablet (500 mg total) by mouth 3 (three) times daily.  21 tablet  0  . iron  polysaccharides (NIFEREX) 150 MG capsule Take 1 capsule (150 mg total) by mouth daily.  90 capsule  0  . lidocaine-prilocaine (EMLA) cream Apply a quarter sized amount to port site 1 hour prior to chemo. Do not rub in.  Cover with plastic.      Marland Kitchen LORazepam (ATIVAN) 1 MG tablet Take 1 mg by mouth. Take 1 tablet every 4 hours IF needed for nausea/vomiting.      . ondansetron (ZOFRAN) 8 MG tablet Take 8 mg by mouth. Starting the 3rd day after chemo, take 1 tablet twice a day IF needed for nausea/vomiting.      Marland Kitchen oxyCODONE-acetaminophen (PERCOCET/ROXICET) 5-325 MG per tablet Take 1 tablet by mouth every 4 (four) hours as needed. Pain  45 tablet  0  . paclitaxel (TAXOL) 30 MG/5ML injection Inject into the vein. Will take this weekly after completion of AC chemo cycles.      . pegfilgrastim (NEULASTA) 6 MG/0.6ML injection Inject 6 mg into the skin every 14 (fourteen) days.      . prochlorperazine (COMPAZINE) 25 MG suppository Place 25 mg rectally every 6 (six) hours as needed. Nausea/vomiting.      . ranitidine (ZANTAC) 150 MG capsule Take 150 mg by mouth daily.      . temazepam (RESTORIL) 30 MG capsule Take 30 mg by mouth at bedtime.      Marland Kitchen venlafaxine (EFFEXOR) 37.5 MG tablet Take 1 tablet (37.5 mg total) by mouth 2 (two) times daily.  60 tablet  3  . omeprazole (PRILOSEC) 20 MG capsule Take 1 capsule (20 mg total) by mouth daily.  31 capsule  2  . polyethylene glycol-electrolytes (TRILYTE) 420 G solution Take 4,000 mLs by mouth as directed.  4000 mL  0   No current facility-administered medications for this visit.   Facility-Administered Medications Ordered in Other Visits  Medication Dose Route Frequency Provider Last Rate Last Dose  . 0.9 % irrigation (POUR BTL)    PRN Fabio Bering, MD   1,000 mL at 06/02/12 1056  . bupivacaine (MARCAINE) 0.5 % injection    PRN Fabio Bering, MD   10 mL at 06/02/12 1056    Allergies as of 08/06/2012 - Review Complete 08/06/2012  Allergen Reaction Noted  .  Motrin (ibuprofen) Swelling 05/26/2012    Family History:There is no known family history of colorectal carcinoma , liver disease, or inflammatory bowel disease.  Problem Relation Age of Onset  . Heart attack Father   . Alcohol abuse Brother   . Alcohol abuse Brother   . Diabetes Mother     History   Social History  . Marital Status: Married    Spouse Name: N/A    Number of Children: 6  . Years of Education: N/A   Occupational History  . unemployed    Social History Main Topics  . Smoking status: Former Smoker -- 0.2 packs/day for 26 years    Quit date:  08/07/2011  . Smokeless tobacco: Former Neurosurgeon    Quit date: 08/18/2005  . Alcohol Use: 0.6 oz/week    1 Cans of beer per week     Comment: Can of beer or 2 daily to flush kidneys per MD, stop drinking 1 mo ago  . Drug Use: No  . Sexually Active: Yes    Birth Control/ Protection: Post-menopausal   Other Topics Concern  . Not on file   Social History Narrative   Lives w/ husband, son, daughter-in-law & 3 grandkids    Review of Systems: Gen: see HPI CV: Denies chest pain, angina, palpitations, syncope, orthopnea, PND, peripheral edema, and claudication. Resp: Denies dyspnea at rest, dyspnea with exercise, cough, sputum, wheezing, coughing up blood, and pleurisy. GI: Denies vomiting blood, jaundice, and fecal incontinence.  GU : Denies urinary burning, blood in urine, urinary frequency, urinary hesitancy, nocturnal urination, and urinary incontinence. MS: Denies joint pain, limitation of movement, and swelling, stiffness, low back pain, extremity pain. Denies muscle weakness, cramps, atrophy.  Derm: see HPI  Psych: Denies depression, anxiety, memory loss, suicidal ideation, hallucinations, paranoia, and confusion. Heme: Denies bruising, bleeding, and enlarged lymph nodes. Neuro:  Denies any headaches, dizziness, paresthesias. Endo:  Denies any problems with DM, thyroid, adrenal function.  Physical Exam: BP 132/84   Pulse 108  Temp 97.4 F (36.3 C) (Oral)  Ht 5\' 1"  (1.549 m)  Wt 219 lb 9.6 oz (99.61 kg)  BMI 41.49 kg/m2 No LMP recorded. Patient is postmenopausal. General:   Alert,  Well-developed, obese, pleasant and cooperative in NAD Head:  Normocephalic and atraumatic. Eyes:  Sclera clear, no icterus.   Conjunctiva pink. Ears:  Normal auditory acuity. Nose:  No deformity, discharge, or lesions. Mouth:  No deformity or lesions,oropharynx pink & moist. Neck:  Supple; no masses or thyromegaly. Lungs:  Clear throughout to auscultation.   No wheezes, crackles, or rhonchi. No acute distress. Heart:  Regular rate and rhythm; no murmurs, clicks, rubs,  or gallops. Abdomen:  Normal bowel sounds.  No bruits.  Soft, non-tender and non-distended without masses, hepatosplenomegaly or hernias noted.  No guarding or rebound tenderness.   Rectal:  Deferred. Msk:  Symmetrical without gross deformities. Normal posture. Pulses:  Normal pulses noted. Extremities:  No clubbing or edema. Neurologic:  Alert and oriented x4;  grossly normal neurologically. Skin:  Intact without significant lesions or rashes. Lymph Nodes:  No significant cervical adenopathy. Psych:  Alert and cooperative. Normal mood and affect.

## 2012-08-07 ENCOUNTER — Telehealth: Payer: Self-pay | Admitting: Urgent Care

## 2012-08-07 ENCOUNTER — Encounter: Payer: Self-pay | Admitting: Urgent Care

## 2012-08-07 ENCOUNTER — Other Ambulatory Visit: Payer: Self-pay | Admitting: Internal Medicine

## 2012-08-07 DIAGNOSIS — R1310 Dysphagia, unspecified: Secondary | ICD-10-CM

## 2012-08-07 DIAGNOSIS — K921 Melena: Secondary | ICD-10-CM

## 2012-08-07 NOTE — Progress Notes (Signed)
Faxed to PCP

## 2012-08-07 NOTE — Telephone Encounter (Signed)
DONE

## 2012-08-07 NOTE — Assessment & Plan Note (Addendum)
She is a small volume intermittent hematochezia. She describes burgundy blood with wiping. Colonoscopy to determine etiology of her bleeding. Differentials include colorectal polyp or carcinoma, benign anorectal source,less likely ischemia.  I have discussed risks & benefits which include, but are not limited to, bleeding, infection, perforation & drug reaction.  The patient agrees with this plan & written consent will be obtained.    Hold iron for 7 days prior to colonoscopy

## 2012-08-07 NOTE — Assessment & Plan Note (Addendum)
Elizabeth Orr is a pleasant 51 y.o. female with recurrent esophageal dysphagia. History of stricture with last esophageal dilation in 2007 by Dr Jena Gauss. She also has hx PUD & erosive reflux esophagitis.  Unfortunately, she is currently undergoing treatment for her breast cancer.  Will need EGD with possible esophageal dilation with Dr. Jena Gauss to look for recurrent esophageal web, ring or stricture.  Of note, she did fine with conscious sedation at that time.  I have discussed risks & benefits which include, but are not limited to, bleeding, infection, perforation & drug reaction.  The patient agrees with this plan & written consent will be obtained.    Stop Zantac Begin omeprazole 20mg  daily Use phenergan 25mg  IV 30 min prior to augment sedation given multiple psychoactive medications & hx polysubstance abuse.

## 2012-08-07 NOTE — Telephone Encounter (Signed)
Please order Phenergan 25mg  IV 30 min prior to procedures for this patient [id number] & hx polysubstance abuse Also, she will need to hold her iron for 7 days Thanks

## 2012-08-11 ENCOUNTER — Encounter (HOSPITAL_BASED_OUTPATIENT_CLINIC_OR_DEPARTMENT_OTHER): Payer: Medicaid Other | Admitting: Oncology

## 2012-08-11 VITALS — BP 132/82 | HR 99 | Temp 98.0°F | Resp 20 | Wt 214.0 lb

## 2012-08-11 DIAGNOSIS — Z170 Estrogen receptor positive status [ER+]: Secondary | ICD-10-CM

## 2012-08-11 DIAGNOSIS — C50919 Malignant neoplasm of unspecified site of unspecified female breast: Secondary | ICD-10-CM

## 2012-08-11 DIAGNOSIS — C50419 Malignant neoplasm of upper-outer quadrant of unspecified female breast: Secondary | ICD-10-CM

## 2012-08-11 MED ORDER — OXYCODONE-ACETAMINOPHEN 5-325 MG PO TABS: 1.0000 | ORAL_TABLET | Freq: Four times a day (QID) | ORAL | Status: DC | PRN

## 2012-08-11 NOTE — Patient Instructions (Signed)
[facility]  Discharge Instructions  RECOMMENDATIONS MADE BY THE CONSULTANT AND ANY TEST RESULTS WILL BE SENT TO YOUR REFERRING DOCTOR.   SPECIAL INSTRUCTIONS/FOLLOW-UP: Return to Clinic as scheduled for your therapy.  Return to see Dellis Anes, PA in 4 weeks as scheduled.  Please see the front desk for your appointment as you leave today.     I acknowledge that I have been informed and understand all the instructions given to me and received a copy. I do not have any more questions at this time, but understand that I may call the [facility] at [phone] during business hours should I have any further questions or need assistance in obtaining follow-up care.    __________________________________________  _____________  __________ Signature of Patient or Authorized Representative            Date                   Time    __________________________________________ Nurse's Signature

## 2012-08-14 ENCOUNTER — Encounter (HOSPITAL_BASED_OUTPATIENT_CLINIC_OR_DEPARTMENT_OTHER): Payer: Medicaid Other

## 2012-08-14 DIAGNOSIS — Z452 Encounter for adjustment and management of vascular access device: Secondary | ICD-10-CM

## 2012-08-14 DIAGNOSIS — C50919 Malignant neoplasm of unspecified site of unspecified female breast: Secondary | ICD-10-CM

## 2012-08-14 LAB — CBC WITH DIFFERENTIAL/PLATELET
Basophils Absolute: 0.1 10*3/uL (ref 0.0–0.1)
Basophils Relative: 2 % — ABNORMAL HIGH (ref 0–1)
Hemoglobin: 8.4 g/dL — ABNORMAL LOW (ref 12.0–15.0)
Lymphocytes Relative: 52 % — ABNORMAL HIGH (ref 12–46)
MCHC: 28.6 g/dL — ABNORMAL LOW (ref 30.0–36.0)
Monocytes Relative: 10 % (ref 3–12)
Neutro Abs: 1.2 10*3/uL — ABNORMAL LOW (ref 1.7–7.7)
Neutrophils Relative %: 36 % — ABNORMAL LOW (ref 43–77)
RDW: 21.1 % — ABNORMAL HIGH (ref 11.5–15.5)
WBC: 3.3 10*3/uL — ABNORMAL LOW (ref 4.0–10.5)

## 2012-08-14 LAB — COMPREHENSIVE METABOLIC PANEL
AST: 16 U/L (ref 0–37)
Albumin: 3.2 g/dL — ABNORMAL LOW (ref 3.5–5.2)
Alkaline Phosphatase: 74 U/L (ref 39–117)
Chloride: 101 mEq/L (ref 96–112)
Potassium: 3.5 mEq/L (ref 3.5–5.1)
Total Bilirubin: 0.3 mg/dL (ref 0.3–1.2)

## 2012-08-14 MED ORDER — HEPARIN SOD (PORK) LOCK FLUSH 100 UNIT/ML IV SOLN
INTRAVENOUS | Status: AC
  Filled 2012-08-14: qty 5

## 2012-08-14 NOTE — Progress Notes (Signed)
Treatment held due to labs, patient to return next Thursday.

## 2012-08-15 ENCOUNTER — Ambulatory Visit (HOSPITAL_COMMUNITY): Payer: Medicaid Other

## 2012-08-18 ENCOUNTER — Ambulatory Visit (HOSPITAL_COMMUNITY): Payer: Medicaid Other

## 2012-08-18 NOTE — Progress Notes (Signed)
Problem number 1 infiltrating ductal carcinoma the breast status post right modified radical mastectomy by Dr. Leticia Penna in 06/08/2012. She is now status post one cycle of chemotherapy which she tolerated without nausea or vomiting. She became somewhat weak and tired but really has done very well.  Her cancer was stage III a. This was a right-sided, grade 2, infiltrating ductal with 3" apparent" separate primaries giving her multicentric disease in the upper-outer and upper inner quadrant of the right breast with 4 positive lymph nodes, all with extracapsular extension. Metastases to the lymph nodes were greater than 2 cm. 2 cancers were analyze for estrogen receptor and progesterone receptors. Her cancer was ER +79% PR +100%, the other was ER +76%, PR +100% and HER-2/neu was not amplified in either of the 3 cancers. I suspect all these cancers are related. Her Ki-67 marker was under 20% in both cancers analyzed. She has seen Dr. Thersa Salt in consultation already. Her Port-A-Cath has done very well. She has mild discomfort there. She also has mild discomfort in the surgical site. She is looking for refill on her narcotic pain medication. What she tells me today is that it was still in from her purse in the process of moving from 1 house to another.  She states that one or 2 of her nephews who helped her move probably stole these since they have had drug issues in the past. Therefore I cannot give her but a 2 week supply. It must last year 2 weeks however. She has a tremendous amount of anxiety overlay with this pain. She has a baseline anxiety issue in my opinion.  Her incision site has healed well no nodularity the chest wall. There is no lymphadenopathy palpable. There was a question of a 3-4 mm supraclavicular node but it was not definite. Lungs were clear. Heart shows a regular rhythm and rate. Should no S3 gallop. Port-A-Cath was intact. She is no leg edema no arm edema and abdomen was soft and nontender.  There is no hepatosplenomegaly.  We'll continue with therapy. After radiation is completed and after the chemotherapy is is completed, she will move on to hormonal therapy probably for 10 years

## 2012-08-20 MED ORDER — SODIUM CHLORIDE 0.45 % IV SOLN: INTRAVENOUS | Status: DC

## 2012-08-21 ENCOUNTER — Encounter (HOSPITAL_COMMUNITY)
Admission: RE | Disposition: A | Discharge status: Home / Self Care | Payer: Self-pay | Source: Ambulatory Visit | Attending: Internal Medicine

## 2012-08-21 ENCOUNTER — Ambulatory Visit (HOSPITAL_COMMUNITY)
Admission: RE | Admit: 2012-08-21 | Discharge: 2012-08-21 | Disposition: A | Discharge status: Home / Self Care | Payer: Medicaid Other | Source: Ambulatory Visit | Attending: Internal Medicine | Admitting: Internal Medicine

## 2012-08-21 ENCOUNTER — Encounter (HOSPITAL_COMMUNITY): Payer: Self-pay | Admitting: *Deleted

## 2012-08-21 ENCOUNTER — Other Ambulatory Visit (HOSPITAL_COMMUNITY): Payer: Self-pay | Admitting: Oncology

## 2012-08-21 DIAGNOSIS — K222 Esophageal obstruction: Secondary | ICD-10-CM

## 2012-08-21 DIAGNOSIS — K648 Other hemorrhoids: Secondary | ICD-10-CM

## 2012-08-21 DIAGNOSIS — K5730 Diverticulosis of large intestine without perforation or abscess without bleeding: Secondary | ICD-10-CM

## 2012-08-21 DIAGNOSIS — R1310 Dysphagia, unspecified: Secondary | ICD-10-CM | POA: Insufficient documentation

## 2012-08-21 DIAGNOSIS — K921 Melena: Secondary | ICD-10-CM | POA: Insufficient documentation

## 2012-08-21 DIAGNOSIS — K449 Diaphragmatic hernia without obstruction or gangrene: Secondary | ICD-10-CM | POA: Insufficient documentation

## 2012-08-21 DIAGNOSIS — K219 Gastro-esophageal reflux disease without esophagitis: Secondary | ICD-10-CM

## 2012-08-21 HISTORY — PX: COLONOSCOPY, ESOPHAGOGASTRODUODENOSCOPY (EGD) AND ESOPHAGEAL DILATION: SHX5781

## 2012-08-21 SURGERY — COLONOSCOPY, ESOPHAGOGASTRODUODENOSCOPY (EGD) AND ESOPHAGEAL DILATION (ED)
Anesthesia: Moderate Sedation

## 2012-08-21 MED ORDER — MIDAZOLAM HCL 5 MG/5ML IJ SOLN
INTRAMUSCULAR | Status: DC | PRN
  Administered 2012-08-21: 2 mg via INTRAVENOUS
  Administered 2012-08-21: 1 mg via INTRAVENOUS
  Administered 2012-08-21 (×3): 2 mg via INTRAVENOUS

## 2012-08-21 MED ORDER — MIDAZOLAM HCL 5 MG/5ML IJ SOLN
INTRAMUSCULAR | Status: AC
  Filled 2012-08-21: qty 10

## 2012-08-21 MED ORDER — SODIUM CHLORIDE 0.9 % IJ SOLN
INTRAMUSCULAR | Status: AC
  Filled 2012-08-21: qty 10

## 2012-08-21 MED ORDER — PROMETHAZINE HCL 25 MG/ML IJ SOLN
25.0000 mg | Freq: Once | INTRAMUSCULAR | Status: AC
  Administered 2012-08-21: 25 mg via INTRAVENOUS

## 2012-08-21 MED ORDER — MEPERIDINE HCL 100 MG/ML IJ SOLN
INTRAMUSCULAR | Status: DC | PRN
  Administered 2012-08-21 (×2): 50 mg via INTRAVENOUS
  Administered 2012-08-21: 25 mg via INTRAVENOUS

## 2012-08-21 MED ORDER — PROMETHAZINE HCL 25 MG/ML IJ SOLN
INTRAMUSCULAR | Status: AC
  Filled 2012-08-21: qty 1

## 2012-08-21 MED ORDER — DIPHENHYDRAMINE HCL 50 MG/ML IJ SOLN
INTRAMUSCULAR | Status: DC | PRN
  Administered 2012-08-21: 25 mg via INTRAVENOUS

## 2012-08-21 MED ORDER — BUTAMBEN-TETRACAINE-BENZOCAINE 2-2-14 % EX AERO
INHALATION_SPRAY | CUTANEOUS | Status: DC | PRN
  Administered 2012-08-21: 2 via TOPICAL

## 2012-08-21 MED ORDER — SODIUM CHLORIDE 0.45 % IV SOLN
INTRAVENOUS | Status: DC
  Administered 2012-08-21: 09:00:00 via INTRAVENOUS

## 2012-08-21 MED ORDER — MEPERIDINE HCL 100 MG/ML IJ SOLN
INTRAMUSCULAR | Status: AC
  Filled 2012-08-21: qty 2

## 2012-08-21 MED ORDER — STERILE WATER FOR IRRIGATION IR SOLN
Status: DC | PRN
  Administered 2012-08-21: 10:00:00

## 2012-08-21 MED ORDER — DIPHENHYDRAMINE HCL 50 MG/ML IJ SOLN
INTRAMUSCULAR | Status: AC
  Filled 2012-08-21: qty 1

## 2012-08-21 NOTE — Op Note (Signed)
[facility] [address] [place], [address]   ENDOSCOPY PROCEDURE REPORT  PATIENT: Elizabeth Orr, Elizabeth Orr  MR#: [id number] BIRTHDATE: [birth year] , 51  yrs. old GENDER: Female ENDOSCOPIST: R.  Roetta Sessions, MD FACP [facility] REFERRED BY:  Glenford Peers, M.D. PROCEDURE DATE:  08/21/2012 PROCEDURE:     EGD with esophageal biopsy  INDICATIONS:      Long-standing GERD;  esophageal dysphagia  INFORMED CONSENT:   The risks, benefits, limitations, alternatives and imponderables have been discussed.  The potential for biopsy, esophogeal dilation, etc. have also been reviewed.  Questions have been answered.  All parties agreeable.  Please see the history and physical in the medical record for more information.  MEDICATIONS:    Versed 4 mg IV and Demerol 100 mg IV. Phenergan 25 mg IV. Cetacaine spray.  DESCRIPTION OF PROCEDURE:   The Pentax Gastroscope [id number] endoscope was introduced through the mouth and advanced to the second portion of the duodenum without difficulty or limitations. The mucosal surfaces were surveyed very carefully during advancement of the scope and upon withdrawal.  Retroflexion view of the proximal stomach and esophagogastric junction was performed.      FINDINGS: Tubular esophagus had a "ringed" appearance. Prominent ring at the GE junction. The scope actually home on the distal ring. With general pressure the scope popped through it into the stomach.  The esophageal mucosa appeared unusually tender and was easily traumatized. There was no esophagitis. No Barrett's esophagus. Stomach empty. 3 cm hiatal hernia. Abnormal gastric mucosa. Patent pylorus. Normal first and second portion of the duodenum  THERAPEUTIC / DIAGNOSTIC MANEUVERS PERFORMED:  Reinspecting the tubular esophagus throughout its lengthrevealed an unusual degree of trauma with superficial tear through the upper esophageal sphincter and a tear of which appeared superficial above  and through the distal esophageal ring. These findings are suspicious for eosinophilic esophagitis. I did not attempt any further esophageal dilation beyond that which occurred with simple scope passage. Subsequently, biopsies of the mid and distal esophagus were taken to assess for eosinophilic esophagitis.  COMPLICATIONS:  None  IMPRESSION:  Esophageal rings-query eosinophilic esophagitis-status post biopsy. Status post dilation with scope passage alone prior to biopsy.   Hiatal hernia.  RECOMMENDATIONS : Followup on pathology. Continue omeprazole. See colonoscopy report.    _______________________________ R. Roetta Sessions, MD FACP [facility] eSigned:  R. Roetta Sessions, MD FACP [facility] 08/21/2012 10:08 AM     CC:  PATIENT NAME:  [name], [name] MR#: [id number]

## 2012-08-21 NOTE — Op Note (Signed)
[facility] [address] [place], [address]   COLONOSCOPY PROCEDURE REPORT  PATIENT: Elizabeth Orr, Elizabeth Orr  MR#:         [id number] BIRTHDATE: 1961/01/31 , 51  yrs. old GENDER: Female ENDOSCOPIST: R.  Roetta Sessions, MD FACP [facility] REFERRED BY:  Glenford Peers, M.D.  [name] PROCEDURE DATE:  08/21/2012 PROCEDURE:     Colonoscopy-diagnostic  INDICATIONS: Paper hematochezia  INFORMED CONSENT:  The risks, benefits, alternatives and imponderables including but not limited to bleeding, perforation as well as the possibility of a missed lesion have been reviewed.  The potential for biopsy, lesion removal, etc. have also been discussed.  Questions have been answered.  All parties agreeable. Please see the history and physical in the medical record for more information.  MEDICATIONS: Versed 9 mg IV and Demerol 125 mg IV. Phenergan 25 mg nightly  DESCRIPTION OF PROCEDURE:  After a digital rectal exam was performed, the EG-[id number] ([id number]) and [id number] ([id number]) colonoscope was advanced from the anus through the rectum and colon to the area of the cecum, ileocecal valve and appendiceal orifice. The cecum was deeply intubated.  These structures were well-seen and photographed for the record.  From the level of the cecum and ileocecal valve, the scope was slowly and cautiously withdrawn. The mucosal surfaces were carefully surveyed utilizing scope tip deflection to facilitate fold flattening as needed.  The scope was pulled down into the rectum where a thorough examination including retroflexion was performed.    FINDINGS:  Adequate preparation.Friable and canal with some internal hemorrhoids; otherwise normal rectum. Shallow left-sided diverticula; the remainder of the colonic mucosa appeared normal.  THERAPEUTIC / DIAGNOSTIC MANEUVERS PERFORMED:  None  COMPLICATIONS: None  CECAL WITHDRAWAL TIME:  10 minutes  IMPRESSION:  Friable anal canal/internal hemorrhoids.  Early left sided colonic diverticulosis. I suspect benign anorectal bleeding.  RECOMMENDATIONS: Course of Anusol suppositories. Daily fiber supplement such as Benefiber. See EGD report.   _______________________________ eSigned:  R. Roetta Sessions, MD FACP [facility] 08/21/2012 10:34 AM   CC:

## 2012-08-21 NOTE — Interval H&P Note (Signed)
History and Physical Interval Note:  08/21/2012 9:40 AM  Elizabeth Orr  has presented today for surgery, with the diagnosis of HEMATOCHEZIA AND DYSPHAGIA  The various methods of treatment have been discussed with the patient and family. After consideration of risks, benefits and other options for treatment, the patient has consented to  Procedure(s) (LRB) with comments: COLONOSCOPY, ESOPHAGOGASTRODUODENOSCOPY (EGD) AND ESOPHAGEAL DILATION (N/A) - 9:30 as a surgical intervention .  The patient's history has been reviewed, patient examined, no change in status, stable for surgery.  I have reviewed the patient's chart and labs.  Questions were answered to the patient's satisfaction.     Eula Listen  As above. Omeprazole has helped reflux with dysphagia but persists. EGD and colonoscopy per plan today.  The risks, benefits, limitations, imponderables and alternatives regarding both EGD and colonoscopy have been reviewed with the patient. Questions have been answered. All parties agreeable.

## 2012-08-21 NOTE — H&P (View-Only) (Signed)
Referring Provider:  Dr. Neijstrom  Primary Care Physician:  ALLEN, DEBRA, NP Primary Gastroenterologist:  Dr. Rourk  Chief Complaint  Patient presents with  . Dysphagia    HPI:  Elizabeth Orr is a 51 y.o. female here as a referral from Dr.Neijstrom for Dysphagia.  She reports a long-standing history of chronic dysphagia. Recently over the last year she's had progressive dysphasia with only solid foods and pills. She denies any problems with liquids. She feels that food is stuck in her lower esophagus. She has episodes where she feels that she is "getting choked". She has been recently diagnosed with breast cancer and is undergoing chemotherapy status post mastectomy under the direction of Dr. Neijstrom. She is taking omeprazole 20mg BID for heartburn and feels this is well controlled.  He is also recently started Carafate 1 g QID 10 days ago & it seems to help with her swallowing. Her next chemotherapy scheduled for 12/27.  She has received one treatment on 12/12.  Last EGD 10/04/2005 by Dr. Rourk she had a large bulbar ulcer without bleeding stigmata, multiple gastric ulcers, and extensive geographic ulcerations the distal third of her tubular esophagus. She also had early stricture formation that was dilated with a 54 French Maloney. Swallowing did seem to get better after this dilation.  She is also requesting a colonoscopy today.  She reports small to moderate amount of burgundy blood in her stool with wiping. She denies any abdominal pain denies any nausea, vomiting, or anorexia. She denies any diarrhea or constipation.  Past Medical History  Diagnosis Date  . Kidney stones 1989  . GERD (gastroesophageal reflux disease)   . Arthritis   . History of stomach ulcers 2003    Hx of esophageal ulcers and stomach ulcers due to reflux  . Breast cancer 06/08/12    right mastectomy  . Back pain   . Infiltrating ductal carcinoma of breast 07/09/2012    Neijstrom:  chemo  . Chronic leg pain      Past Surgical History  Procedure Date  . Esophagogastroduodenoscopy   . Tubal ligation 1989  . Breast surgery     breast biopsy-right  . Mastectomy 06/08/12    right  . Esophageal dilation   . Portacath placement 07/22/2012    Procedure: INSERTION PORT-A-CATH;  Surgeon: Brent C Ziegler, MD;  Location: [facility];  Service: General;  Laterality: Left;  left subclavian  . Esophagogastroduodenoscopy (egd) with esophageal dilation 10/04/2005    Rourk-Extensive geographic ulcerations distal third of the tubular esoophagus consistent with severe ulcerative relux esophagitis, early stricture formation status post dilation as described above. 2.  Multiple gastric ulcers without bleeding stigmata as described above. otherwise normal stomach.  3. large bulbar ulcer without bleeding stigmata. otherwise D1 and D2 appeared normal.    Current Outpatient Prescriptions  Medication Sig Dispense Refill  . albuterol (PROVENTIL HFA;VENTOLIN HFA) 108 (90 BASE) MCG/ACT inhaler Inhale 2 puffs into the lungs every 6 (six) hours as needed for wheezing.  1 Inhaler  2  . ALPRAZolam (XANAX) 0.25 MG tablet Take 0.25 mg by mouth 3 (three) times daily as needed.      . cyclophosphamide (CYTOXAN) 2 G chemo injection Inject into the vein every 14 (fourteen) days.      . dexamethasone (DECADRON) 4 MG tablet Take 8 mg by mouth. Starting the day after chemo, take 2 tablets in the am and 2 tablets in the pm for 2 days. Then stop. Repeat this after each cycle of chemo.    When starting Taxol chemotherapy, you will take 2 tablets @ 9pm the night before chemo then 2 tablets @ 3am the morning of chemo. Then starting the day after Taxol chemo, take 2 tablets in the am for 2 days. Repeat this with each treatment.      . DOXOrubicin HCl (ADRIAMYCIN IV) Inject into the vein every 14 (fourteen) days.      . famciclovir (FAMVIR) 500 MG tablet Take 1 tablet (500 mg total) by mouth 3 (three) times daily.  21 tablet  0  . iron  polysaccharides (NIFEREX) 150 MG capsule Take 1 capsule (150 mg total) by mouth daily.  90 capsule  0  . lidocaine-prilocaine (EMLA) cream Apply a quarter sized amount to port site 1 hour prior to chemo. Do not rub in.  Cover with plastic.      . LORazepam (ATIVAN) 1 MG tablet Take 1 mg by mouth. Take 1 tablet every 4 hours IF needed for nausea/vomiting.      . ondansetron (ZOFRAN) 8 MG tablet Take 8 mg by mouth. Starting the 3rd day after chemo, take 1 tablet twice a day IF needed for nausea/vomiting.      . oxyCODONE-acetaminophen (PERCOCET/ROXICET) 5-325 MG per tablet Take 1 tablet by mouth every 4 (four) hours as needed. Pain  45 tablet  0  . paclitaxel (TAXOL) 30 MG/5ML injection Inject into the vein. Will take this weekly after completion of AC chemo cycles.      . pegfilgrastim (NEULASTA) 6 MG/0.6ML injection Inject 6 mg into the skin every 14 (fourteen) days.      . prochlorperazine (COMPAZINE) 25 MG suppository Place 25 mg rectally every 6 (six) hours as needed. Nausea/vomiting.      . ranitidine (ZANTAC) 150 MG capsule Take 150 mg by mouth daily.      . temazepam (RESTORIL) 30 MG capsule Take 30 mg by mouth at bedtime.      . venlafaxine (EFFEXOR) 37.5 MG tablet Take 1 tablet (37.5 mg total) by mouth 2 (two) times daily.  60 tablet  3  . omeprazole (PRILOSEC) 20 MG capsule Take 1 capsule (20 mg total) by mouth daily.  31 capsule  2  . polyethylene glycol-electrolytes (TRILYTE) 420 G solution Take 4,000 mLs by mouth as directed.  4000 mL  0   No current facility-administered medications for this visit.   Facility-Administered Medications Ordered in Other Visits  Medication Dose Route Frequency Provider Last Rate Last Dose  . 0.9 % irrigation (POUR BTL)    PRN Brent C Ziegler, MD   1,000 mL at 06/02/12 1056  . bupivacaine (MARCAINE) 0.5 % injection    PRN Brent C Ziegler, MD   10 mL at 06/02/12 1056    Allergies as of 08/06/2012 - Review Complete 08/06/2012  Allergen Reaction Noted  .  Motrin (ibuprofen) Swelling 05/26/2012    Family History:There is no known family history of colorectal carcinoma , liver disease, or inflammatory bowel disease.  Problem Relation Age of Onset  . Heart attack Father   . Alcohol abuse Brother   . Alcohol abuse Brother   . Diabetes Mother     History   Social History  . Marital Status: Married    Spouse Name: N/A    Number of Children: 6  . Years of Education: N/A   Occupational History  . unemployed    Social History Main Topics  . Smoking status: Former Smoker -- 0.2 packs/day for 26 years    Quit date:   08/07/2011  . Smokeless tobacco: Former User    Quit date: 08/18/2005  . Alcohol Use: 0.6 oz/week    1 Cans of beer per week     Comment: Can of beer or 2 daily to flush kidneys per MD, stop drinking 1 mo ago  . Drug Use: No  . Sexually Active: Yes    Birth Control/ Protection: Post-menopausal   Other Topics Concern  . Not on file   Social History Narrative   Lives w/ husband, son, daughter-in-law & 3 grandkids    Review of Systems: Gen: see HPI CV: Denies chest pain, angina, palpitations, syncope, orthopnea, PND, peripheral edema, and claudication. Resp: Denies dyspnea at rest, dyspnea with exercise, cough, sputum, wheezing, coughing up blood, and pleurisy. GI: Denies vomiting blood, jaundice, and fecal incontinence.  GU : Denies urinary burning, blood in urine, urinary frequency, urinary hesitancy, nocturnal urination, and urinary incontinence. MS: Denies joint pain, limitation of movement, and swelling, stiffness, low back pain, extremity pain. Denies muscle weakness, cramps, atrophy.  Derm: see HPI  Psych: Denies depression, anxiety, memory loss, suicidal ideation, hallucinations, paranoia, and confusion. Heme: Denies bruising, bleeding, and enlarged lymph nodes. Neuro:  Denies any headaches, dizziness, paresthesias. Endo:  Denies any problems with DM, thyroid, adrenal function.  Physical Exam: BP 132/84   Pulse 108  Temp 97.4 F (36.3 C) (Oral)  Ht 5' 1" (1.549 m)  Wt 219 lb 9.6 oz (99.61 kg)  BMI 41.49 kg/m2 No LMP recorded. Patient is postmenopausal. General:   Alert,  Well-developed, obese, pleasant and cooperative in NAD Head:  Normocephalic and atraumatic. Eyes:  Sclera clear, no icterus.   Conjunctiva pink. Ears:  Normal auditory acuity. Nose:  No deformity, discharge, or lesions. Mouth:  No deformity or lesions,oropharynx pink & moist. Neck:  Supple; no masses or thyromegaly. Lungs:  Clear throughout to auscultation.   No wheezes, crackles, or rhonchi. No acute distress. Heart:  Regular rate and rhythm; no murmurs, clicks, rubs,  or gallops. Abdomen:  Normal bowel sounds.  No bruits.  Soft, non-tender and non-distended without masses, hepatosplenomegaly or hernias noted.  No guarding or rebound tenderness.   Rectal:  Deferred. Msk:  Symmetrical without gross deformities. Normal posture. Pulses:  Normal pulses noted. Extremities:  No clubbing or edema. Neurologic:  Alert and oriented x4;  grossly normal neurologically. Skin:  Intact without significant lesions or rashes. Lymph Nodes:  No significant cervical adenopathy. Psych:  Alert and cooperative. Normal mood and affect.   

## 2012-08-24 ENCOUNTER — Encounter (HOSPITAL_COMMUNITY): Payer: Self-pay | Admitting: Internal Medicine

## 2012-08-24 ENCOUNTER — Encounter (HOSPITAL_COMMUNITY): Payer: Medicaid Other | Attending: Oncology

## 2012-08-24 VITALS — BP 134/81 | HR 106 | Temp 97.0°F | Resp 20 | Wt 213.8 lb

## 2012-08-24 DIAGNOSIS — Z5111 Encounter for antineoplastic chemotherapy: Secondary | ICD-10-CM

## 2012-08-24 DIAGNOSIS — D649 Anemia, unspecified: Secondary | ICD-10-CM | POA: Insufficient documentation

## 2012-08-24 DIAGNOSIS — C50919 Malignant neoplasm of unspecified site of unspecified female breast: Secondary | ICD-10-CM | POA: Insufficient documentation

## 2012-08-24 LAB — CBC WITH DIFFERENTIAL/PLATELET
Basophils Relative: 1 % (ref 0–1)
HCT: 33.9 % — ABNORMAL LOW (ref 36.0–46.0)
Hemoglobin: 9.7 g/dL — ABNORMAL LOW (ref 12.0–15.0)
MCHC: 28.6 g/dL — ABNORMAL LOW (ref 30.0–36.0)
Monocytes Absolute: 0.7 10*3/uL (ref 0.1–1.0)
Monocytes Relative: 7 % (ref 3–12)
Neutro Abs: 6.1 10*3/uL (ref 1.7–7.7)

## 2012-08-24 LAB — COMPREHENSIVE METABOLIC PANEL
BUN: 7 mg/dL (ref 6–23)
CO2: 25 mEq/L (ref 19–32)
Chloride: 103 mEq/L (ref 96–112)
Creatinine, Ser: 0.54 mg/dL (ref 0.50–1.10)
GFR calc Af Amer: >90 mL/min (ref >90)
GFR calc non Af Amer: >90 mL/min (ref >90)
Total Bilirubin: 0.3 mg/dL (ref 0.3–1.2)

## 2012-08-24 LAB — CANCER ANTIGEN 27.29: CA 27.29: 23 U/mL (ref 0–39)

## 2012-08-24 MED ORDER — SODIUM CHLORIDE 0.9 % IV SOLN
Freq: Once | INTRAVENOUS | Status: AC
  Administered 2012-08-24: 12:00:00 via INTRAVENOUS

## 2012-08-24 MED ORDER — PALONOSETRON HCL INJECTION 0.25 MG/5ML
0.2500 mg | Freq: Once | INTRAVENOUS | Status: AC
  Administered 2012-08-24: 0.25 mg via INTRAVENOUS

## 2012-08-24 MED ORDER — SODIUM CHLORIDE 0.9 % IV SOLN
600.0000 mg/m2 | Freq: Once | INTRAVENOUS | Status: AC
  Administered 2012-08-24: 1220 mg via INTRAVENOUS
  Filled 2012-08-24: qty 61

## 2012-08-24 MED ORDER — HEPARIN SOD (PORK) LOCK FLUSH 100 UNIT/ML IV SOLN
500.0000 [IU] | Freq: Once | INTRAVENOUS | Status: AC | PRN
  Administered 2012-08-24: 500 [IU]
  Filled 2012-08-24: qty 5

## 2012-08-24 MED ORDER — SODIUM CHLORIDE 0.9 % IV SOLN
Freq: Once | INTRAVENOUS | Status: AC
  Administered 2012-08-24: 13:00:00 via INTRAVENOUS
  Filled 2012-08-24: qty 5

## 2012-08-24 MED ORDER — HEPARIN SOD (PORK) LOCK FLUSH 100 UNIT/ML IV SOLN
INTRAVENOUS | Status: AC
  Filled 2012-08-24: qty 5

## 2012-08-24 MED ORDER — DOXORUBICIN HCL CHEMO IV INJECTION 2 MG/ML
60.0000 mg/m2 | Freq: Once | INTRAVENOUS | Status: AC
  Administered 2012-08-24: 122 mg via INTRAVENOUS
  Filled 2012-08-24: qty 61

## 2012-08-24 MED ORDER — PALONOSETRON HCL INJECTION 0.25 MG/5ML
INTRAVENOUS | Status: AC
  Filled 2012-08-24: qty 5

## 2012-08-24 MED ORDER — SODIUM CHLORIDE 0.9 % IJ SOLN
10.0000 mL | INTRAMUSCULAR | Status: DC | PRN
  Administered 2012-08-24: 10 mL
  Filled 2012-08-24: qty 10

## 2012-08-24 NOTE — Progress Notes (Signed)
Tolerated chemo well. 

## 2012-08-25 ENCOUNTER — Encounter: Payer: Self-pay | Admitting: Internal Medicine

## 2012-08-25 ENCOUNTER — Encounter (HOSPITAL_BASED_OUTPATIENT_CLINIC_OR_DEPARTMENT_OTHER): Payer: Medicaid Other

## 2012-08-25 ENCOUNTER — Encounter: Payer: Self-pay | Admitting: *Deleted

## 2012-08-25 VITALS — BP 145/86 | HR 106

## 2012-08-25 DIAGNOSIS — D649 Anemia, unspecified: Secondary | ICD-10-CM

## 2012-08-25 DIAGNOSIS — C50919 Malignant neoplasm of unspecified site of unspecified female breast: Secondary | ICD-10-CM

## 2012-08-25 MED ORDER — PEGFILGRASTIM INJECTION 6 MG/0.6ML
6.0000 mg | Freq: Once | SUBCUTANEOUS | Status: AC
  Administered 2012-08-25: 6 mg via SUBCUTANEOUS

## 2012-08-25 MED ORDER — PEGFILGRASTIM INJECTION 6 MG/0.6ML
SUBCUTANEOUS | Status: AC
  Filled 2012-08-25: qty 0.6

## 2012-08-25 NOTE — Progress Notes (Signed)
Elizabeth Orr presents today for injection per MD orders. Neulasta 6mg  administered SQ in right Abdomen. Administration without incident. Patient tolerated well.  C/O problem re: meds.  Spoke Eliezer Champagne, PA

## 2012-08-27 ENCOUNTER — Inpatient Hospital Stay (HOSPITAL_COMMUNITY): Payer: Medicaid Other

## 2012-08-28 ENCOUNTER — Ambulatory Visit (HOSPITAL_COMMUNITY): Payer: Medicaid Other

## 2012-08-28 ENCOUNTER — Other Ambulatory Visit (HOSPITAL_COMMUNITY): Payer: Self-pay | Admitting: Oncology

## 2012-08-28 ENCOUNTER — Inpatient Hospital Stay (HOSPITAL_COMMUNITY): Payer: Medicaid Other

## 2012-08-28 DIAGNOSIS — C50919 Malignant neoplasm of unspecified site of unspecified female breast: Secondary | ICD-10-CM

## 2012-08-28 MED ORDER — OXYCODONE-ACETAMINOPHEN 5-325 MG PO TABS: 1.0000 | ORAL_TABLET | Freq: Four times a day (QID) | ORAL | Status: DC | PRN

## 2012-08-31 ENCOUNTER — Inpatient Hospital Stay (HOSPITAL_COMMUNITY): Payer: Medicaid Other

## 2012-09-01 ENCOUNTER — Ambulatory Visit (HOSPITAL_COMMUNITY): Payer: Medicaid Other

## 2012-09-02 ENCOUNTER — Other Ambulatory Visit (HOSPITAL_COMMUNITY): Payer: Medicaid Other

## 2012-09-07 ENCOUNTER — Inpatient Hospital Stay (HOSPITAL_COMMUNITY): Payer: Medicaid Other

## 2012-09-08 ENCOUNTER — Encounter (HOSPITAL_BASED_OUTPATIENT_CLINIC_OR_DEPARTMENT_OTHER): Payer: Medicaid Other

## 2012-09-08 ENCOUNTER — Other Ambulatory Visit (HOSPITAL_COMMUNITY): Payer: Self-pay | Admitting: Oncology

## 2012-09-08 ENCOUNTER — Ambulatory Visit (HOSPITAL_COMMUNITY): Payer: Medicaid Other

## 2012-09-08 VITALS — BP 119/72 | HR 98 | Temp 97.9°F | Resp 18 | Wt 208.0 lb

## 2012-09-08 DIAGNOSIS — Z5111 Encounter for antineoplastic chemotherapy: Secondary | ICD-10-CM

## 2012-09-08 DIAGNOSIS — C50419 Malignant neoplasm of upper-outer quadrant of unspecified female breast: Secondary | ICD-10-CM

## 2012-09-08 DIAGNOSIS — Z170 Estrogen receptor positive status [ER+]: Secondary | ICD-10-CM

## 2012-09-08 DIAGNOSIS — C50919 Malignant neoplasm of unspecified site of unspecified female breast: Secondary | ICD-10-CM

## 2012-09-08 LAB — COMPREHENSIVE METABOLIC PANEL
ALT: 11 U/L (ref 0–35)
AST: 14 U/L (ref 0–37)
Albumin: 3.5 g/dL (ref 3.5–5.2)
CO2: 25 mEq/L (ref 19–32)
Calcium: 9.3 mg/dL (ref 8.4–10.5)
Chloride: 104 mEq/L (ref 96–112)
Creatinine, Ser: 0.61 mg/dL (ref 0.50–1.10)
GFR calc non Af Amer: >90 mL/min (ref >90)
Sodium: 139 mEq/L (ref 135–145)
Total Bilirubin: 0.6 mg/dL (ref 0.3–1.2)

## 2012-09-08 LAB — CBC WITH DIFFERENTIAL/PLATELET
Basophils Absolute: 0 10*3/uL (ref 0.0–0.1)
Basophils Relative: 1 % (ref 0–1)
Lymphocytes Relative: 24 % (ref 12–46)
MCHC: 28.8 g/dL — ABNORMAL LOW (ref 30.0–36.0)
Neutro Abs: 3.6 10*3/uL (ref 1.7–7.7)
Platelets: 258 10*3/uL (ref 150–400)
RDW: 22.8 % — ABNORMAL HIGH (ref 11.5–15.5)
WBC: 5.3 10*3/uL (ref 4.0–10.5)

## 2012-09-08 MED ORDER — SODIUM CHLORIDE 0.9 % IV SOLN: 150.0000 mg | Freq: Once | INTRAVENOUS | Status: DC

## 2012-09-08 MED ORDER — DOXORUBICIN HCL CHEMO IV INJECTION 2 MG/ML
60.0000 mg/m2 | Freq: Once | INTRAVENOUS | Status: AC
  Administered 2012-09-08: 122 mg via INTRAVENOUS
  Filled 2012-09-08: qty 61

## 2012-09-08 MED ORDER — DEXAMETHASONE SODIUM PHOSPHATE 4 MG/ML IJ SOLN: 12.0000 mg | Freq: Once | INTRAMUSCULAR | Status: DC

## 2012-09-08 MED ORDER — HEPARIN SOD (PORK) LOCK FLUSH 100 UNIT/ML IV SOLN
INTRAVENOUS | Status: AC
  Filled 2012-09-08: qty 5

## 2012-09-08 MED ORDER — SODIUM CHLORIDE 0.9 % IV SOLN
600.0000 mg/m2 | Freq: Once | INTRAVENOUS | Status: AC
  Administered 2012-09-08: 1220 mg via INTRAVENOUS
  Filled 2012-09-08: qty 61

## 2012-09-08 MED ORDER — PALONOSETRON HCL INJECTION 0.25 MG/5ML
0.2500 mg | Freq: Once | INTRAVENOUS | Status: AC
  Administered 2012-09-08: 0.25 mg via INTRAVENOUS

## 2012-09-08 MED ORDER — SODIUM CHLORIDE 0.9 % IV SOLN
Freq: Once | INTRAVENOUS | Status: AC
  Administered 2012-09-08: 10:00:00 via INTRAVENOUS

## 2012-09-08 MED ORDER — SODIUM CHLORIDE 0.9 % IV SOLN
Freq: Once | INTRAVENOUS | Status: AC
  Administered 2012-09-08: 10:00:00 via INTRAVENOUS
  Filled 2012-09-08: qty 5

## 2012-09-08 MED ORDER — SODIUM CHLORIDE 0.9 % IJ SOLN
10.0000 mL | INTRAMUSCULAR | Status: DC | PRN
  Administered 2012-09-08: 10 mL
  Filled 2012-09-08: qty 10

## 2012-09-08 MED ORDER — PALONOSETRON HCL INJECTION 0.25 MG/5ML
INTRAVENOUS | Status: AC
  Filled 2012-09-08: qty 5

## 2012-09-08 MED ORDER — HEPARIN SOD (PORK) LOCK FLUSH 100 UNIT/ML IV SOLN
500.0000 [IU] | Freq: Once | INTRAVENOUS | Status: DC | PRN
  Filled 2012-09-08: qty 5

## 2012-09-08 NOTE — Progress Notes (Signed)
Tolerated chemo well.  Dr, Mariel Sleet in to see her regarding use of narcotics for normal pain.Spoke with her re refills on antidepressant med, and ways  to relieve stress in her life.  Also suggested melatonin otc as an alternative sleep aid, and taking Aleve once or twice daily.  Also, called chaplain, Leola Brazil, in to see Elizabeth Orr who spoke with her a while about methods to use conducive to sleep. [name] will see her Next time she has chemo.

## 2012-09-09 ENCOUNTER — Encounter (HOSPITAL_BASED_OUTPATIENT_CLINIC_OR_DEPARTMENT_OTHER): Payer: Medicaid Other

## 2012-09-09 VITALS — BP 164/104 | HR 97 | Temp 97.9°F | Resp 20

## 2012-09-09 DIAGNOSIS — C50919 Malignant neoplasm of unspecified site of unspecified female breast: Secondary | ICD-10-CM

## 2012-09-09 DIAGNOSIS — C50419 Malignant neoplasm of upper-outer quadrant of unspecified female breast: Secondary | ICD-10-CM

## 2012-09-09 MED ORDER — PEGFILGRASTIM INJECTION 6 MG/0.6ML
SUBCUTANEOUS | Status: AC
  Filled 2012-09-09: qty 0.6

## 2012-09-09 MED ORDER — PEGFILGRASTIM INJECTION 6 MG/0.6ML
6.0000 mg | Freq: Once | SUBCUTANEOUS | Status: AC
  Administered 2012-09-09: 6 mg via SUBCUTANEOUS

## 2012-09-09 NOTE — Progress Notes (Signed)
Elizabeth Orr presents today for injection per MD orders. Neulasta 6mg administered SQ in left Abdomen. Administration without incident. Patient tolerated well.  

## 2012-09-10 ENCOUNTER — Encounter (HOSPITAL_BASED_OUTPATIENT_CLINIC_OR_DEPARTMENT_OTHER): Payer: Medicaid Other | Admitting: Oncology

## 2012-09-10 ENCOUNTER — Ambulatory Visit (HOSPITAL_COMMUNITY)
Admission: RE | Admit: 2012-09-10 | Discharge: 2012-09-10 | Disposition: A | Discharge status: Home / Self Care | Payer: Medicaid Other | Source: Ambulatory Visit | Attending: Oncology | Admitting: Oncology

## 2012-09-10 ENCOUNTER — Inpatient Hospital Stay (HOSPITAL_COMMUNITY): Payer: Medicaid Other

## 2012-09-10 VITALS — BP 144/84 | HR 94 | Temp 97.7°F | Resp 20 | Wt 214.0 lb

## 2012-09-10 DIAGNOSIS — C50919 Malignant neoplasm of unspecified site of unspecified female breast: Secondary | ICD-10-CM

## 2012-09-10 DIAGNOSIS — F1010 Alcohol abuse, uncomplicated: Secondary | ICD-10-CM

## 2012-09-10 DIAGNOSIS — F329 Major depressive disorder, single episode, unspecified: Secondary | ICD-10-CM

## 2012-09-10 DIAGNOSIS — Z9221 Personal history of antineoplastic chemotherapy: Secondary | ICD-10-CM | POA: Insufficient documentation

## 2012-09-10 DIAGNOSIS — G4700 Insomnia, unspecified: Secondary | ICD-10-CM

## 2012-09-10 DIAGNOSIS — I517 Cardiomegaly: Secondary | ICD-10-CM

## 2012-09-10 NOTE — Progress Notes (Signed)
Elizabeth Kelch, NP No address on file  1. Infiltrating ductal carcinoma of breast     CURRENT THERAPY: S/P 3 cycles of AC beginning on 07/30/2012.  INTERVAL HISTORY: Elizabeth Orr 52 y.o. female returns for  regular  visit for followup of Stage IIIa right-sided, grade 2, infiltrating ductal carcinoma with 3 separate primaries giving her multicentric disease in the upper outer and upper inner quadrants of the right breast complicated by 4 positive lymph nodes, all with extracapsular extension, with metastases greater than 2 cm within the lymph nodes. 2 of the 3 tumors were analyzed for ER and PR receptor status. Both tumors are strongly positive for estrogen receptors and progesterone receptors. The one tumor was 79% ER positive and the second was 76% PR positive. Both were 100% PR positive. HER-2/neu was not amplified in any of the 3 tumors. Ki-67 marker was under 20% in both tumors. S/P right modified radical mastectomy by Dr. Leticia Orr on 06/08/2012.  Elizabeth Orr was seen by Dr. Mariel Sleet the other day when she was getting chemotherapy.  She was asking for pain medication without a good indication for the narcotics.  It was discovered during this long conversation lasting more than 20 minutes that the patient, who reported to be separated from her husband at time of diagnosis, was an alcoholic.  She led [place] to believe that they were separated due to his infidelity, but we discovered that they were separated due to Elizabeth Orr.  She reported drinking 12-15 beers daily.  Now she and her husband are back together and no EtOH is in the house due to the husband's demand.  He does not drink EtOH.  She has quit drinking only recently.  She reports intermittent, sharp, fleeting, right post-mastectomy pain.  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  She also complained of chemotherapy induced alopecia cranial pain that is not typically painful, but rather  uncomfortable.  She was using narcotics for this pain too.  After a long conversation about her pain medication requirements, she is to use OTC Tylenol or NSAIDs for her discomfort only.  She was also found to be depressed which is natural for her diagnosis and treatment, in addition to her personal stressors of raising 3 grandchildren, etc.  She was prescribed Effexor which she has not yet picked up.  She was encouraged to ascertain this medication.  She reports difficulty sleeping.  She admits to trying Benadryl, Nyquil, Tylenol PM, etc.  None of these remedies have helped.  Her sleeping may improve once she is on antidepressant therapy.  She requests an Rx for Xanax which she has used in the past for sleep and relaxing.  I have declined to give her an Rx for Xanax.  I have asked her to try the Effexor.  I will get her the telephone number to her PCP, who is listed to be Elizabeth Orr, for help with her sleeping. She may be a candidate for intermezzo or other sleeping aid, but I will defer to PCP.  She otherwise is tolerating therapy very well.  She denies any nausea or vomiting.   I provided her some education regarding her future plan which includes completing cycle 4 of AC.  She will have a 2 week break and then move on to weekly Paclitaxel.  We discussed the side-effects of this chemotherapy drug including bone pain and peripheral neuropathy.  Neulasta will be D/Ced when she move on with that therapy.  Following completion of chemotherapy, we will get the patient back to Dr. Thersa Salt for radiation therapy.  She has already seen Rad Onc in consultation.  After radiation, the patient will then take daily anti-endocrine therapy x 5 years.   Oncologically, the patient denies any complaints.     Past Medical History  Diagnosis Date  . Kidney stones 1989  . GERD (gastroesophageal reflux disease)   . Arthritis   . History of stomach ulcers 2003/2007    Hx of esophageal ulcers and stomach ulcers due to  reflux  . Breast cancer 06/08/12    right mastectomy  . Back pain   . Infiltrating ductal carcinoma of breast 07/09/2012    Neijstrom:  chemo  . Chronic leg pain   . Polysubstance abuse   . Depression   . Anxiety     has Infiltrating ductal carcinoma of breast; Hematochezia; Dysphagia; and Anemia on her problem list.     is allergic to motrin.  Elizabeth Orr does not currently have medications on file.  Past Surgical History  Procedure Date  . Esophagogastroduodenoscopy   . Tubal ligation 1989  . Breast surgery     breast biopsy-right  . Mastectomy 06/08/12    right  . Esophageal dilation   . Portacath placement 07/22/2012    Procedure: INSERTION PORT-A-CATH;  Surgeon: Fabio Bering, MD;  Location: [facility];  Service: General;  Laterality: Left;  left subclavian  . Esophagogastroduodenoscopy (egd) with esophageal dilation 10/04/2005    Rourk-Extensive geographic ulcerations distal third of the tubular esoophagus consistent with severe ulcerative relux esophagitis, early stricture formation status post dilation as described above. 2.  Multiple gastric ulcers without bleeding stigmata as described above. otherwise normal stomach.  3. large bulbar ulcer without bleeding stigmata. otherwise D1 and D2 appeared normal.  . Colonoscopy, esophagogastroduodenoscopy (egd) and esophageal dilation 08/21/2012    Procedure: COLONOSCOPY, ESOPHAGOGASTRODUODENOSCOPY (EGD) AND ESOPHAGEAL DILATION;  Surgeon: Corbin Ade, MD;  Location: [facility];  Service: Endoscopy;  Laterality: N/A;  9:30    Denies any headaches, dizziness, double vision, fevers, chills, night sweats, nausea, vomiting, diarrhea, constipation, chest pain, heart palpitations, shortness of breath, blood in stool, black tarry stool, urinary pain, urinary burning, urinary frequency, hematuria.   PHYSICAL EXAMINATION  ECOG PERFORMANCE STATUS: 1 - Symptomatic but completely ambulatory  Filed Vitals:   09/10/12 1054  BP: 144/84    Pulse: 94  Temp: 97.7 F (36.5 C)  Resp: 20    GENERAL:alert, no distress, well nourished, well developed, comfortable, cooperative, obese, smiling and tired SKIN: skin color, texture, turgor are normal, no rashes or significant lesions HEAD: Normocephalic, No masses, lesions, tenderness or abnormalities EYES: normal, Conjunctiva are pink and non-injected EARS: External ears normal OROPHARYNX:mucous membranes are moist and poor dentition  NECK: supple, no adenopathy, thyroid normal size, non-tender, without nodularity, no stridor, non-tender, trachea midline LYMPH:  no palpable lymphadenopathy, no hepatosplenomegaly BREAST:left breast normal without mass, skin or nipple changes or axillary nodes, right post-mastectomy site well healed and free of suspicious changes LUNGS: clear to auscultation and percussion HEART: regular rate & rhythm, no murmurs, no gallops, S1 normal and S2 normal ABDOMEN:abdomen soft, non-tender, obese, normal bowel sounds, no masses or organomegaly and no hepatosplenomegaly BACK: Back symmetric, no curvature., No CVA tenderness EXTREMITIES:less then 2 second capillary refill, no joint deformities, effusion, or inflammation, no edema, no skin discoloration, no clubbing, no cyanosis  NEURO: alert & oriented x 3 with fluent speech, no focal motor/sensory deficits, gait normal  LABORATORY DATA: CBC    Component Value Date/Time   WBC 5.3 09/08/2012 0839   RBC 3.81* 09/08/2012 0839   HGB 8.8* 09/08/2012 0839   HCT 30.6* 09/08/2012 0839   PLT 258 09/08/2012 0839   MCV 80.3 09/08/2012 0839   MCH 23.1* 09/08/2012 0839   MCHC 28.8* 09/08/2012 0839   RDW 22.8* 09/08/2012 0839   LYMPHSABS 1.3 09/08/2012 0839   MONOABS 0.3 09/08/2012 0839   EOSABS 0.0 09/08/2012 0839   BASOSABS 0.0 09/08/2012 0839      Chemistry      Component Value Date/Time   NA 139 09/08/2012 0839   K 3.7 09/08/2012 0839   CL 104 09/08/2012 0839   CO2 25 09/08/2012 0839   BUN 6 09/08/2012 0839    CREATININE 0.61 09/08/2012 0839      Component Value Date/Time   CALCIUM 9.3 09/08/2012 0839   ALKPHOS 92 09/08/2012 0839   AST 14 09/08/2012 0839   ALT 11 09/08/2012 0839   BILITOT 0.6 09/08/2012 0839      PATHOLOGY: 06/08/2012  Diagnosis  1. Lymph node, sentinel, biopsy, right axillary  - ONE BENIGN LYMPH NODE, NEGATIVE FOR METASTATIC CARCINOMA (0/1).  2. Breast, modified radical mastectomy , right  - NO RESIDUAL CARCINOMA.  - FOCAL ATYPICAL DUCTAL HYPERPLASIA.  - EXTENSIVE PREVIOUS EXCISIONAL SITE CHANGES.  - INKED NEW RESECTION MARGINS, NEGATIVE FOR ATYPIA OR MALIGNANCY.  - FOUR OF NINE LYMPH NODES, POSITIVE FOR METASTATIC CARCINOMA WITH EXTRANODAL  EXTENSION (4/9).  - PLEASE SEE COMMENT.  Microscopic Comment  2. BREAST, INVASIVE TUMOR, WITH LYMPH NODE SAMPLING  PLEASE CORRELATE WITH PREVIOUS CASE [phone] FOR DETAILS.  Lymph nodes:  # examined: 10  Lymph nodes with metastasis:4  Isolated tumor cells (< 0.2 mm): 0  Micrometastasis: (> 0.2 mm and < 2.0 mm): 0  Macrometastasis: (> 2.0 mm): 2  Extracapsular extension: 2  TNM: mpT2, pN2a  Abigail Miyamoto MD  Pathologist, Electronic Signature  (Case signed 06/10/2012)    ASSESSMENT:  1. Stage IIIa right-sided, grade 2, infiltrating ductal carcinoma with 3 separate primaries giving her multicentric disease in the upper outer and upper inner quadrants of the right breast complicated by 4 positive lymph nodes, all with extracapsular extension, with metastases greater than 2 cm within the lymph nodes. 2 of the 3 tumors were analyzed for ER and PR receptor status. Both tumors are strongly positive for estrogen receptors and progesterone receptors. The one tumor was 79% ER positive and the second was 76% PR positive. Both were 100% PR positive. HER-2/neu was not amplified in any of the 3 tumors. Ki-67 marker was under 20% in both tumors. S/P right modified radical mastectomy by Dr. Leticia Orr on 06/08/2012.  S/P 3 cycles of AC beginning on  07/30/2012 which will be followed by Paclitaxel 2. Chronic depression with anxiety times several years.  3. Probable asthma with wheezing bilaterally  4. Obesity  5. Poor dental hygiene  6. Chronic low back pain  7. Normocytic anemia unclear etiology  8. Herpes zoster of C5 dermatome, resolved after appropriate treatment. 9. History of EtOH abuse drinking 12-15 beers daily. 10. Depression, untreated 11. Cranial discomfort secondary to alopecia 12. Insomnia, failed OTC medications   PLAN:  1. I personally reviewed and went over laboratory results with the patient. 2. Cycle 4 on 09/21/2012 as scheduled, followed by Neulasta on 2/4.  3. Pre-chemotherapy labs ordered.  4. Patient encouraged to ascertain her Effexor Rx Dr. Mariel Sleet provided her the other day. 5.  Patient is to use OTC pain medication/NSAIDs for discomfort.  She will not receive anymore narcotics from the Cancer Center unless indicated.  6. Patient encouraged to follow-up with PCP regarding insomnia.  Patient requested Xanax and I declined. 7. Patient education regarding Paclitaxel chemotherapy after cycle 4 of AC. 8. Patient education regarding radiation and anti-endocrine therapy in the future.  9. Patient education regarding cranial discomfort from alopecia, chemotherapy-induced 10. Patient education regarding post-op neuropathy discomfort. 11. Return in 1 month for follow-up.   All questions were answered. The patient knows to call the clinic with any problems, questions or concerns. We can certainly see the patient much sooner if necessary.  Patient and plan will be discussed with Dr. Mariel Sleet in the near future.   Elizabeth Orr

## 2012-09-10 NOTE — Progress Notes (Signed)
*  PRELIMINARY RESULTS* Echocardiogram 2D Echocardiogram has been performed.  Conrad  09/10/2012, 10:42 AM

## 2012-09-10 NOTE — Patient Instructions (Addendum)
.  [facility] Discharge Instructions  RECOMMENDATIONS MADE BY THE CONSULTANT AND ANY TEST RESULTS WILL BE SENT TO YOUR REFERRING PHYSICIAN.  EXAM FINDINGS BY THE PHYSICIAN TODAY AND SIGNS OR SYMPTOMS TO REPORT TO CLINIC OR PRIMARY PHYSICIAN: Exam is good. I will get the number for [name] and you can make appt with her to discuss your sleep problems. The effexor may help with your sleep.  INSTRUCTIONS GIVEN AND DISCUSSED: One more treatment of Adria/cytoxan then you will start weekly taxol. You will not get the white blood cell shot with that treatment.  SPECIAL INSTRUCTIONS/FOLLOW-UP: 1 month  Thank you for choosing [facility] to provide your oncology and hematology care.  To afford each patient quality time with our providers, please arrive at least 15 minutes before your scheduled appointment time.  With your help, our goal is to use those 15 minutes to complete the necessary work-up to ensure our physicians have the information they need to help with your evaluation and healthcare recommendations.    Effective January 1st, 2014, we ask that you re-schedule your appointment with our physicians should you arrive 10 or more minutes late for your appointment.  We strive to give you quality time with our providers, and arriving late affects you and other patients whose appointments are after yours.    Again, thank you for choosing [facility].  Our hope is that these requests will decrease the amount of time that you wait before being seen by our physicians.       _____________________________________________________________  Should you have questions after your visit to [facility], please contact our office at [phone] between the hours of 8:30 a.m. and 5:00 p.m.  Voicemails left after 4:30 p.m. will not be returned until the following business day.  For prescription refill requests, have your pharmacy contact our office  with your prescription refill request.

## 2012-09-11 ENCOUNTER — Ambulatory Visit (HOSPITAL_COMMUNITY): Payer: Medicaid Other

## 2012-09-14 ENCOUNTER — Inpatient Hospital Stay (HOSPITAL_COMMUNITY): Payer: Medicaid Other

## 2012-09-21 ENCOUNTER — Inpatient Hospital Stay (HOSPITAL_COMMUNITY): Payer: Medicaid Other

## 2012-09-22 ENCOUNTER — Ambulatory Visit (HOSPITAL_COMMUNITY): Payer: Medicaid Other

## 2012-09-24 ENCOUNTER — Inpatient Hospital Stay (HOSPITAL_COMMUNITY): Payer: Medicaid Other

## 2012-09-25 ENCOUNTER — Ambulatory Visit (HOSPITAL_COMMUNITY): Payer: Medicaid Other

## 2012-09-28 ENCOUNTER — Encounter (HOSPITAL_COMMUNITY): Payer: Medicaid Other | Attending: Oncology

## 2012-09-28 ENCOUNTER — Telehealth (HOSPITAL_COMMUNITY): Payer: Self-pay | Admitting: *Deleted

## 2012-09-28 VITALS — Temp 98.4°F

## 2012-09-28 DIAGNOSIS — Z9889 Other specified postprocedural states: Secondary | ICD-10-CM | POA: Insufficient documentation

## 2012-09-28 DIAGNOSIS — C50919 Malignant neoplasm of unspecified site of unspecified female breast: Secondary | ICD-10-CM | POA: Insufficient documentation

## 2012-09-28 DIAGNOSIS — Z95828 Presence of other vascular implants and grafts: Secondary | ICD-10-CM

## 2012-09-28 DIAGNOSIS — Z452 Encounter for adjustment and management of vascular access device: Secondary | ICD-10-CM

## 2012-09-28 LAB — COMPREHENSIVE METABOLIC PANEL
ALT: 11 U/L (ref 0–35)
Alkaline Phosphatase: 121 U/L — ABNORMAL HIGH (ref 39–117)
CO2: 24 mEq/L (ref 19–32)
Chloride: 99 mEq/L (ref 96–112)
GFR calc Af Amer: >90 mL/min (ref >90)
GFR calc non Af Amer: >90 mL/min (ref >90)
Glucose, Bld: 92 mg/dL (ref 70–99)
Potassium: 3.2 mEq/L — ABNORMAL LOW (ref 3.5–5.1)
Sodium: 137 mEq/L (ref 135–145)
Total Bilirubin: 0.3 mg/dL (ref 0.3–1.2)
Total Protein: 6.1 g/dL (ref 6.0–8.3)

## 2012-09-28 LAB — CBC WITH DIFFERENTIAL/PLATELET
HCT: 31.8 % — ABNORMAL LOW (ref 36.0–46.0)
Hemoglobin: 9.3 g/dL — ABNORMAL LOW (ref 12.0–15.0)
Lymphocytes Relative: 18 % (ref 12–46)
Lymphs Abs: 1.2 10*3/uL (ref 0.7–4.0)
Monocytes Relative: 16 % — ABNORMAL HIGH (ref 3–12)
Neutro Abs: 4.4 10*3/uL (ref 1.7–7.7)
Neutrophils Relative %: 65 % (ref 43–77)
RBC: 3.72 MIL/uL — ABNORMAL LOW (ref 3.87–5.11)

## 2012-09-28 MED ORDER — HEPARIN SOD (PORK) LOCK FLUSH 100 UNIT/ML IV SOLN
INTRAVENOUS | Status: AC
  Filled 2012-09-28: qty 5

## 2012-09-28 MED ORDER — SODIUM CHLORIDE 0.9 % IJ SOLN
10.0000 mL | INTRAMUSCULAR | Status: DC | PRN
  Administered 2012-09-28: 10 mL via INTRAVENOUS
  Filled 2012-09-28: qty 10

## 2012-09-28 MED ORDER — HEPARIN SOD (PORK) LOCK FLUSH 100 UNIT/ML IV SOLN
500.0000 [IU] | Freq: Once | INTRAVENOUS | Status: AC
  Administered 2012-09-28: 500 [IU] via INTRAVENOUS
  Filled 2012-09-28: qty 5

## 2012-09-28 NOTE — Telephone Encounter (Signed)
Appointment with Dr.Ziegler 09-29-12 at 9:30

## 2012-09-28 NOTE — Progress Notes (Signed)
Patient here for chemo but c/o cold symptoms starting over the weekend. She c/o congestion, running eyes, and greenish mucous.. States she has had fever and has been taking advil. She is 98.4 here today. Also saw Leticia Penna on Friday for fluid collection under right breast. She says this "popped" last night, with a lot of bloody and "creamy" fluid released. Has open area along mastectomy scar on right. Leticia Penna put her on antibiotic last week. She does not know the name but she takes them 4 times a day. She also was given oxycodone which she said made her nauseous so she flushed down the drain. Explained to pt that oxycodone is the same as what is in her percocet. Prescription given to pt for enough percocet to last her until she sees [employer]. Dr. Mariel Sleet in to see pt and Dr. Leticia Penna notified and will see patient tomorrow at 930.  Portacath located lt chest wall accessed with  H 20 needle. Good blood return present. Portacath flushed with 20ml NS and 500U/19ml Heparin and needle removed intact. Procedure without incident. Patient tolerated procedure well.  Home with her husband.Marland Kitchen

## 2012-09-29 ENCOUNTER — Ambulatory Visit (HOSPITAL_COMMUNITY): Payer: Medicaid Other

## 2012-09-30 ENCOUNTER — Other Ambulatory Visit (HOSPITAL_COMMUNITY): Payer: Self-pay

## 2012-09-30 MED ORDER — POTASSIUM CHLORIDE CRYS ER 20 MEQ PO TBCR: 20.0000 meq | EXTENDED_RELEASE_TABLET | Freq: Every day | ORAL | Status: DC

## 2012-10-02 ENCOUNTER — Inpatient Hospital Stay (HOSPITAL_COMMUNITY): Payer: Medicaid Other

## 2012-10-03 ENCOUNTER — Ambulatory Visit (HOSPITAL_COMMUNITY): Payer: Medicaid Other

## 2012-10-05 ENCOUNTER — Inpatient Hospital Stay (HOSPITAL_COMMUNITY): Payer: Medicaid Other

## 2012-10-06 ENCOUNTER — Ambulatory Visit (HOSPITAL_COMMUNITY): Payer: Medicaid Other

## 2012-10-08 ENCOUNTER — Encounter (HOSPITAL_BASED_OUTPATIENT_CLINIC_OR_DEPARTMENT_OTHER): Payer: Medicaid Other

## 2012-10-08 VITALS — BP 142/84 | HR 103 | Temp 97.9°F | Resp 20 | Wt 208.6 lb

## 2012-10-08 DIAGNOSIS — Z5111 Encounter for antineoplastic chemotherapy: Secondary | ICD-10-CM

## 2012-10-08 DIAGNOSIS — C50919 Malignant neoplasm of unspecified site of unspecified female breast: Secondary | ICD-10-CM

## 2012-10-08 LAB — CBC WITH DIFFERENTIAL/PLATELET
Basophils Relative: 1 % (ref 0–1)
Eosinophils Absolute: 0.2 10*3/uL (ref 0.0–0.7)
Hemoglobin: 9.5 g/dL — ABNORMAL LOW (ref 12.0–15.0)
MCH: 24.5 pg — ABNORMAL LOW (ref 26.0–34.0)
MCHC: 28.4 g/dL — ABNORMAL LOW (ref 30.0–36.0)
Monocytes Absolute: 0.7 10*3/uL (ref 0.1–1.0)
Monocytes Relative: 9 % (ref 3–12)
Neutrophils Relative %: 56 % (ref 43–77)

## 2012-10-08 LAB — COMPREHENSIVE METABOLIC PANEL
Albumin: 2.6 g/dL — ABNORMAL LOW (ref 3.5–5.2)
BUN: 4 mg/dL — ABNORMAL LOW (ref 6–23)
Creatinine, Ser: 0.57 mg/dL (ref 0.50–1.10)
Potassium: 3.4 mEq/L — ABNORMAL LOW (ref 3.5–5.1)
Total Protein: 6.1 g/dL (ref 6.0–8.3)

## 2012-10-08 MED ORDER — SODIUM CHLORIDE 0.9 % IV SOLN
Freq: Once | INTRAVENOUS | Status: AC
  Administered 2012-10-08: 10:00:00 via INTRAVENOUS
  Filled 2012-10-08: qty 5

## 2012-10-08 MED ORDER — HEPARIN SOD (PORK) LOCK FLUSH 100 UNIT/ML IV SOLN
500.0000 [IU] | Freq: Once | INTRAVENOUS | Status: AC
  Administered 2012-10-08: 500 [IU] via INTRAVENOUS
  Filled 2012-10-08: qty 5

## 2012-10-08 MED ORDER — HEPARIN SOD (PORK) LOCK FLUSH 100 UNIT/ML IV SOLN
INTRAVENOUS | Status: AC
  Filled 2012-10-08: qty 5

## 2012-10-08 MED ORDER — SODIUM CHLORIDE 0.9 % IV SOLN
INTRAVENOUS | Status: DC
  Administered 2012-10-08: 10:00:00 via INTRAVENOUS

## 2012-10-08 MED ORDER — SODIUM CHLORIDE 0.9 % IV SOLN
600.0000 mg/m2 | Freq: Once | INTRAVENOUS | Status: AC
  Administered 2012-10-08: 1220 mg via INTRAVENOUS
  Filled 2012-10-08: qty 61

## 2012-10-08 MED ORDER — PALONOSETRON HCL INJECTION 0.25 MG/5ML
0.2500 mg | Freq: Once | INTRAVENOUS | Status: AC
  Administered 2012-10-08: 0.25 mg via INTRAVENOUS

## 2012-10-08 MED ORDER — DOXORUBICIN HCL CHEMO IV INJECTION 2 MG/ML
60.0000 mg/m2 | Freq: Once | INTRAVENOUS | Status: AC
  Administered 2012-10-08: 122 mg via INTRAVENOUS
  Filled 2012-10-08: qty 61

## 2012-10-08 MED ORDER — PALONOSETRON HCL INJECTION 0.25 MG/5ML
INTRAVENOUS | Status: AC
  Filled 2012-10-08: qty 5

## 2012-10-09 ENCOUNTER — Ambulatory Visit (HOSPITAL_COMMUNITY): Payer: Medicaid Other | Admitting: Oncology

## 2012-10-09 ENCOUNTER — Encounter (HOSPITAL_BASED_OUTPATIENT_CLINIC_OR_DEPARTMENT_OTHER): Payer: Medicaid Other

## 2012-10-09 VITALS — BP 135/88 | HR 93 | Temp 97.7°F | Resp 22

## 2012-10-09 DIAGNOSIS — C50919 Malignant neoplasm of unspecified site of unspecified female breast: Secondary | ICD-10-CM

## 2012-10-09 LAB — CANCER ANTIGEN 27.29: CA 27.29: 31 U/mL (ref 0–39)

## 2012-10-09 MED ORDER — PEGFILGRASTIM INJECTION 6 MG/0.6ML
SUBCUTANEOUS | Status: AC
  Filled 2012-10-09: qty 0.6

## 2012-10-09 MED ORDER — PEGFILGRASTIM INJECTION 6 MG/0.6ML
6.0000 mg | Freq: Once | SUBCUTANEOUS | Status: AC
  Administered 2012-10-09: 6 mg via SUBCUTANEOUS

## 2012-10-09 NOTE — Progress Notes (Signed)
Elizabeth Orr presents today for injection per MD orders. Neulasta 6mg administered SQ in left Abdomen. Administration without incident. Patient tolerated well.  

## 2012-10-12 ENCOUNTER — Encounter (HOSPITAL_COMMUNITY): Payer: Self-pay | Admitting: Oncology

## 2012-10-12 ENCOUNTER — Inpatient Hospital Stay (HOSPITAL_COMMUNITY): Payer: Medicaid Other

## 2012-10-12 ENCOUNTER — Ambulatory Visit (HOSPITAL_COMMUNITY): Payer: Medicaid Other | Admitting: Oncology

## 2012-10-12 NOTE — Progress Notes (Signed)
This encounter was created in error - please disregard.

## 2012-10-19 ENCOUNTER — Inpatient Hospital Stay (HOSPITAL_COMMUNITY): Payer: Medicaid Other

## 2012-10-20 NOTE — Progress Notes (Signed)
rescheduled

## 2012-10-21 ENCOUNTER — Ambulatory Visit (HOSPITAL_COMMUNITY): Payer: Medicaid Other | Admitting: Oncology

## 2012-10-26 ENCOUNTER — Inpatient Hospital Stay (HOSPITAL_COMMUNITY): Payer: Medicaid Other

## 2012-10-27 ENCOUNTER — Telehealth (HOSPITAL_COMMUNITY): Payer: Self-pay | Admitting: *Deleted

## 2012-10-27 NOTE — Progress Notes (Signed)
Elizabeth Kelch, NP No address on file  Infiltrating ductal carcinoma of breast, right  CURRENT THERAPY:S/P 4 cycles of AC (07/30/2012- 10/08/2012).  Due to embark on weekly Paclitaxel x 12 today (10/28/2012).   INTERVAL HISTORY: Elizabeth Orr 52 y.o. female returns for  regular  visit for followup of  Stage IIIa right-sided, grade 2, infiltrating ductal carcinoma with 3 separate primaries giving her multicentric disease in the upper outer and upper inner quadrants of the right breast complicated by 4 positive lymph nodes, all with extracapsular extension, with metastases greater than 2 cm within the lymph nodes. 2 of the 3 tumors were analyzed for ER and PR receptor status. Both tumors are strongly positive for estrogen receptors and progesterone receptors. The one tumor was 79% ER positive and the second was 76% PR positive. Both were 100% PR positive. HER-2/neu was not amplified in any of the 3 tumors. Ki-67 marker was under 20% in both tumors. S/P right modified radical mastectomy by Dr. Leticia Orr on 06/08/2012.  Elizabeth Orr has been going to [facility] for help with her depression.  She was given Hydroxyzine, Trazodone, and Citalopram.  She stopped taking the Citalopram and Trazodone because it made her drowsy during the day.  She is using Hydroxyzine 100 mg at HS and this is helping her with sleep.  She requests a refill on her Xanax and I will defer that to [facility] if this medication is felt to be appropriate in this patient's care.   She reports a headache on her left side of head that was present for about 1 week.  It would come and go.  It resolved spontaneously without intervention.  She reports that it resolved a number of days ago and has not recurred.  She will keep [place] in the loop on that.   Otherwise, oncologically she denies any complaints and ROS questioning is negative.    She has an appointment with Dr. Leticia Orr to follow-up on her breast surgery in the near future, I think it is tomorrow.     Past Medical History  Diagnosis Date  . Kidney stones 1989  . GERD (gastroesophageal reflux disease)   . Arthritis   . History of stomach ulcers 2003/2007    Hx of esophageal ulcers and stomach ulcers due to reflux  . Breast cancer 06/08/12    right mastectomy  . Back pain   . Infiltrating ductal carcinoma of breast 07/09/2012    Neijstrom:  chemo  . Chronic leg pain   . Polysubstance abuse   . Depression   . Anxiety     has Infiltrating ductal carcinoma of breast; Hematochezia; Dysphagia; and Anemia on her problem list.     is allergic to citalopram; effexor; motrin; and trazodone and nefazodone.  [name] had no medications administered during this visit.  Past Surgical History  Procedure Laterality Date  . Esophagogastroduodenoscopy    . Tubal ligation  1989  . Breast surgery      breast biopsy-right  . Mastectomy  06/08/12    right  . Esophageal dilation    . Portacath placement  07/22/2012    Procedure: INSERTION PORT-A-CATH;  Surgeon: Fabio Bering, MD;  Location: [facility];  Service: General;  Laterality: Left;  left subclavian  . Esophagogastroduodenoscopy (egd) with esophageal dilation  10/04/2005    Rourk-Extensive geographic ulcerations distal third of the tubular esoophagus consistent with severe ulcerative relux esophagitis, early stricture formation status post dilation as described above. 2.  Multiple gastric ulcers without bleeding  stigmata as described above. otherwise normal stomach.  3. large bulbar ulcer without bleeding stigmata. otherwise D1 and D2 appeared normal.  . Colonoscopy, esophagogastroduodenoscopy (egd) and esophageal dilation  08/21/2012    Procedure: COLONOSCOPY, ESOPHAGOGASTRODUODENOSCOPY (EGD) AND ESOPHAGEAL DILATION;  Surgeon: Corbin Ade, MD;  Location: [facility];  Service: Endoscopy;  Laterality: N/A;  9:30    Denies any headaches, dizziness, double vision, fevers, chills, night sweats, nausea, vomiting, diarrhea, constipation,  chest pain, heart palpitations, shortness of breath, blood in stool, black tarry stool, urinary pain, urinary burning, urinary frequency, hematuria.   PHYSICAL EXAMINATION  ECOG PERFORMANCE STATUS: 1 - Symptomatic but completely ambulatory  There were no vitals filed for this visit.  GENERAL:alert, no distress, well nourished, well developed, comfortable, cooperative, obese, smiling and tired  SKIN: skin color, texture, turgor are normal, no rashes or significant lesions  HEAD: Normocephalic, No masses, lesions, tenderness or abnormalities  EYES: normal, Conjunctiva are pink and non-injected  EARS: External ears normal  OROPHARYNX:mucous membranes are moist and poor dentition  NECK: supple, no adenopathy, thyroid normal size, non-tender, without nodularity, no stridor, non-tender, trachea midline  LYMPH: no palpable lymphadenopathy, no hepatosplenomegaly  BREAST:not examined LUNGS: clear to auscultation and percussion  HEART: regular rate & rhythm, no murmurs, no gallops, S1 normal and S2 normal  ABDOMEN:abdomen soft, non-tender, obese, normal bowel sounds,  BACK: Back symmetric, no curvature., No CVA tenderness  EXTREMITIES:less then 2 second capillary refill, no joint deformities, effusion, or inflammation, no edema, no skin discoloration, no clubbing, no cyanosis  NEURO: alert & oriented x 3 with fluent speech, no focal motor/sensory deficits, gait normal   LABORATORY DATA: CBC    Component Value Date/Time   WBC 4.6 10/28/2012 0911   RBC 3.46* 10/28/2012 0911   HGB 9.3* 10/28/2012 0911   HCT 30.9* 10/28/2012 0911   PLT 391 10/28/2012 0911   MCV 89.3 10/28/2012 0911   MCH 26.9 10/28/2012 0911   MCHC 30.1 10/28/2012 0911   RDW 24.7* 10/28/2012 0911   LYMPHSABS 0.6* 10/28/2012 0911   MONOABS 0.0* 10/28/2012 0911   EOSABS 0.0 10/28/2012 0911   BASOSABS 0.0 10/28/2012 0911      Chemistry      Component Value Date/Time   NA 136 10/28/2012 0911   K 3.6 10/28/2012 0911   CL 102 10/28/2012  0911   CO2 19 10/28/2012 0911   BUN 6 10/28/2012 0911   CREATININE 0.52 10/28/2012 0911      Component Value Date/Time   CALCIUM 9.0 10/28/2012 0911   ALKPHOS 139* 10/28/2012 0911   AST 21 10/28/2012 0911   ALT 17 10/28/2012 0911   BILITOT 0.4 10/28/2012 0911      06/08/2012  Diagnosis  1. Lymph node, sentinel, biopsy, right axillary  - ONE BENIGN LYMPH NODE, NEGATIVE FOR METASTATIC CARCINOMA (0/1).  2. Breast, modified radical mastectomy , right  - NO RESIDUAL CARCINOMA.  - FOCAL ATYPICAL DUCTAL HYPERPLASIA.  - EXTENSIVE PREVIOUS EXCISIONAL SITE CHANGES.  - INKED NEW RESECTION MARGINS, NEGATIVE FOR ATYPIA OR MALIGNANCY.  - FOUR OF NINE LYMPH NODES, POSITIVE FOR METASTATIC CARCINOMA WITH EXTRANODAL  EXTENSION (4/9).  - PLEASE SEE COMMENT.  Microscopic Comment  2. BREAST, INVASIVE TUMOR, WITH LYMPH NODE SAMPLING  PLEASE CORRELATE WITH PREVIOUS CASE [phone] FOR DETAILS.  Lymph nodes:  # examined: 10  Lymph nodes with metastasis:4  Isolated tumor cells (< 0.2 mm): 0  Micrometastasis: (> 0.2 mm and < 2.0 mm): 0  Macrometastasis: (> 2.0 mm): 2  Extracapsular extension: 2  TNM: mpT2, pN2a  Abigail Miyamoto MD  Pathologist, Electronic Signature  (Case signed 06/10/2012)     ASSESSMENT:  1. Stage IIIa right-sided, grade 2, infiltrating ductal carcinoma with 3 separate primaries giving her multicentric disease in the upper outer and upper inner quadrants of the right breast complicated by 4 positive lymph nodes, all with extracapsular extension, with metastases greater than 2 cm within the lymph nodes. 2 of the 3 tumors were analyzed for ER and PR receptor status. Both tumors are strongly positive for estrogen receptors and progesterone receptors. The one tumor was 79% ER positive and the second was 76% PR positive. Both were 100% PR positive. HER-2/neu was not amplified in any of the 3 tumors. Ki-67 marker was under 20% in both tumors. S/P right modified radical mastectomy by Dr.  Leticia Orr on 06/08/2012. S/P 4 cycles of AC beginning (07/30/2012- 10/08/12), embarking on cycle 1 out of 12 of weekly Paclitaxel  2. Chronic depression with anxiety times several years.  3. Probable asthma with wheezing bilaterally  4. Obesity  5. Poor dental hygiene  6. Chronic low back pain  7. Normocytic anemia unclear etiology  8. Herpes zoster of C5 dermatome, resolved after appropriate treatment.  9. History of EtOH abuse drinking 12-15 beers daily.  10. Depression, untreated  11. 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 Insomnia, failed OTC medications    PLAN:  1. I personally reviewed and went over laboratory results with the patient. 2. Pre-chemotherapy labs are ordered as a standing order: CBC diff, CMET 3. Move on with chemotherapy as scheduled.  4. Discussed side effects of Paclitaxel  5. Patient seen in consultation by Rad Onc and we will get her back to Dr. Thersa Salt near the completion of therapy.   6. I declined a Xanax refill for the patient.  7. Return in 3 weeks for follow-up   All questions were answered. The patient knows to call the clinic with any problems, questions or concerns. We can certainly see the patient much sooner if necessary.  The patient and plan discussed with Glenford Peers, MD and he is in agreement with the aforementioned.  KEFALAS,THOMAS

## 2012-10-28 ENCOUNTER — Encounter (HOSPITAL_COMMUNITY): Payer: Medicaid Other | Attending: Oncology

## 2012-10-28 ENCOUNTER — Encounter (HOSPITAL_BASED_OUTPATIENT_CLINIC_OR_DEPARTMENT_OTHER): Payer: Medicaid Other | Admitting: Oncology

## 2012-10-28 VITALS — BP 136/78 | HR 98 | Temp 97.8°F | Resp 18 | Wt 208.2 lb

## 2012-10-28 DIAGNOSIS — C50419 Malignant neoplasm of upper-outer quadrant of unspecified female breast: Secondary | ICD-10-CM

## 2012-10-28 DIAGNOSIS — C773 Secondary and unspecified malignant neoplasm of axilla and upper limb lymph nodes: Secondary | ICD-10-CM

## 2012-10-28 DIAGNOSIS — C50911 Malignant neoplasm of unspecified site of right female breast: Secondary | ICD-10-CM

## 2012-10-28 DIAGNOSIS — Z170 Estrogen receptor positive status [ER+]: Secondary | ICD-10-CM

## 2012-10-28 DIAGNOSIS — Z5111 Encounter for antineoplastic chemotherapy: Secondary | ICD-10-CM

## 2012-10-28 DIAGNOSIS — C50919 Malignant neoplasm of unspecified site of unspecified female breast: Secondary | ICD-10-CM | POA: Insufficient documentation

## 2012-10-28 DIAGNOSIS — K219 Gastro-esophageal reflux disease without esophagitis: Secondary | ICD-10-CM | POA: Insufficient documentation

## 2012-10-28 LAB — COMPREHENSIVE METABOLIC PANEL
AST: 21 U/L (ref 0–37)
BUN: 6 mg/dL (ref 6–23)
CO2: 19 mEq/L (ref 19–32)
Chloride: 102 mEq/L (ref 96–112)
Creatinine, Ser: 0.52 mg/dL (ref 0.50–1.10)
GFR calc non Af Amer: >90 mL/min (ref >90)
Total Bilirubin: 0.4 mg/dL (ref 0.3–1.2)

## 2012-10-28 LAB — CBC WITH DIFFERENTIAL/PLATELET
HCT: 30.9 % — ABNORMAL LOW (ref 36.0–46.0)
Hemoglobin: 9.3 g/dL — ABNORMAL LOW (ref 12.0–15.0)
Lymphocytes Relative: 12 % (ref 12–46)
Monocytes Absolute: 0 10*3/uL — ABNORMAL LOW (ref 0.1–1.0)
Monocytes Relative: 1 % — ABNORMAL LOW (ref 3–12)
Neutro Abs: 4 10*3/uL (ref 1.7–7.7)
RBC: 3.46 MIL/uL — ABNORMAL LOW (ref 3.87–5.11)
WBC: 4.6 10*3/uL (ref 4.0–10.5)

## 2012-10-28 MED ORDER — HEPARIN SOD (PORK) LOCK FLUSH 100 UNIT/ML IV SOLN
INTRAVENOUS | Status: AC
  Filled 2012-10-28: qty 5

## 2012-10-28 MED ORDER — PACLITAXEL CHEMO INJECTION 300 MG/50ML
80.0000 mg/m2 | Freq: Once | INTRAVENOUS | Status: AC
  Administered 2012-10-28: 162 mg via INTRAVENOUS
  Filled 2012-10-28: qty 27

## 2012-10-28 MED ORDER — DIPHENHYDRAMINE HCL 50 MG/ML IJ SOLN
INTRAMUSCULAR | Status: AC
  Filled 2012-10-28: qty 1

## 2012-10-28 MED ORDER — SODIUM CHLORIDE 0.9 % IV SOLN
Freq: Once | INTRAVENOUS | Status: AC
  Administered 2012-10-28: 11:00:00 via INTRAVENOUS

## 2012-10-28 MED ORDER — DIPHENHYDRAMINE HCL 50 MG/ML IJ SOLN
50.0000 mg | Freq: Once | INTRAMUSCULAR | Status: AC
  Administered 2012-10-28: 50 mg via INTRAVENOUS

## 2012-10-28 MED ORDER — HEPARIN SOD (PORK) LOCK FLUSH 100 UNIT/ML IV SOLN
500.0000 [IU] | Freq: Once | INTRAVENOUS | Status: AC | PRN
  Administered 2012-10-28: 500 [IU]
  Filled 2012-10-28: qty 5

## 2012-10-28 MED ORDER — SODIUM CHLORIDE 0.9 % IV SOLN
Freq: Once | INTRAVENOUS | Status: AC
  Administered 2012-10-28: 8 mg via INTRAVENOUS
  Filled 2012-10-28: qty 4

## 2012-10-28 MED ORDER — FAMOTIDINE IN NACL 20-0.9 MG/50ML-% IV SOLN
INTRAVENOUS | Status: AC
  Filled 2012-10-28: qty 50

## 2012-10-28 MED ORDER — SODIUM CHLORIDE 0.9 % IJ SOLN
10.0000 mL | INTRAMUSCULAR | Status: DC | PRN
  Administered 2012-10-28: 10 mL
  Filled 2012-10-28: qty 10

## 2012-10-28 MED ORDER — SODIUM CHLORIDE 0.9 % IV SOLN: 8.0000 mg | Freq: Once | INTRAVENOUS | Status: DC

## 2012-10-28 MED ORDER — DEXAMETHASONE SODIUM PHOSPHATE 10 MG/ML IJ SOLN: 20.0000 mg | Freq: Once | INTRAMUSCULAR | Status: DC

## 2012-10-28 MED ORDER — FAMOTIDINE IN NACL 20-0.9 MG/50ML-% IV SOLN
20.0000 mg | Freq: Once | INTRAVENOUS | Status: AC
  Administered 2012-10-28: 20 mg via INTRAVENOUS

## 2012-10-28 NOTE — Progress Notes (Signed)
Tolerated chemo well. 

## 2012-10-28 NOTE — Patient Instructions (Addendum)
[facility] Discharge Instructions  RECOMMENDATIONS MADE BY THE CONSULTANT AND ANY TEST RESULTS WILL BE SENT TO YOUR REFERRING PHYSICIAN.  Continue with current chemotherapy plan as scheduled. Return to clinic in 3 weeks to see physician. Report any issues/concerns to clinic as needed. Elizabeth Orr has recommended that you follow up with [facility] about the Xanax.  Thank you for choosing [facility] to provide your oncology and hematology care.  To afford each patient quality time with our providers, please arrive at least 15 minutes before your scheduled appointment time.  With your help, our goal is to use those 15 minutes to complete the necessary work-up to ensure our physicians have the information they need to help with your evaluation and healthcare recommendations.    Effective January 1st, 2014, we ask that you re-schedule your appointment with our physicians should you arrive 10 or more minutes late for your appointment.  We strive to give you quality time with our providers, and arriving late affects you and other patients whose appointments are after yours.    Again, thank you for choosing [facility].  Our hope is that these requests will decrease the amount of time that you wait before being seen by our physicians.       _____________________________________________________________  Should you have questions after your visit to [facility], please contact our office at [phone] between the hours of 8:30 a.m. and 5:00 p.m.  Voicemails left after 4:30 p.m. will not be returned until the following business day.  For prescription refill requests, have your pharmacy contact our office with your prescription refill request.

## 2012-10-29 NOTE — Progress Notes (Signed)
Patient arrived on 3/13 for appt. She was not scheduled for anything today. Explained to patient that she does not get neulasta with the taxol regimen.

## 2012-11-04 ENCOUNTER — Inpatient Hospital Stay (HOSPITAL_COMMUNITY): Payer: Medicaid Other

## 2012-11-06 ENCOUNTER — Encounter (HOSPITAL_BASED_OUTPATIENT_CLINIC_OR_DEPARTMENT_OTHER): Payer: Medicaid Other

## 2012-11-06 VITALS — BP 140/114 | HR 135 | Temp 98.3°F | Resp 20 | Wt 206.6 lb

## 2012-11-06 DIAGNOSIS — C50419 Malignant neoplasm of upper-outer quadrant of unspecified female breast: Secondary | ICD-10-CM

## 2012-11-06 DIAGNOSIS — C773 Secondary and unspecified malignant neoplasm of axilla and upper limb lymph nodes: Secondary | ICD-10-CM

## 2012-11-06 DIAGNOSIS — C50911 Malignant neoplasm of unspecified site of right female breast: Secondary | ICD-10-CM

## 2012-11-06 DIAGNOSIS — C50919 Malignant neoplasm of unspecified site of unspecified female breast: Secondary | ICD-10-CM

## 2012-11-06 DIAGNOSIS — Z5111 Encounter for antineoplastic chemotherapy: Secondary | ICD-10-CM

## 2012-11-06 LAB — CBC WITH DIFFERENTIAL/PLATELET
Basophils Absolute: 0 10*3/uL (ref 0.0–0.1)
Eosinophils Absolute: 0 10*3/uL (ref 0.0–0.7)
Lymphs Abs: 0.7 10*3/uL (ref 0.7–4.0)
MCH: 27.4 pg (ref 26.0–34.0)
Neutrophils Relative %: 83 % — ABNORMAL HIGH (ref 43–77)
Platelets: 299 10*3/uL (ref 150–400)
RBC: 3.32 MIL/uL — ABNORMAL LOW (ref 3.87–5.11)
RDW: 24.5 % — ABNORMAL HIGH (ref 11.5–15.5)
WBC: 5 10*3/uL (ref 4.0–10.5)

## 2012-11-06 LAB — COMPREHENSIVE METABOLIC PANEL
ALT: 32 U/L (ref 0–35)
AST: 35 U/L (ref 0–37)
Albumin: 3.2 g/dL — ABNORMAL LOW (ref 3.5–5.2)
Alkaline Phosphatase: 89 U/L (ref 39–117)
Glucose, Bld: 157 mg/dL — ABNORMAL HIGH (ref 70–99)
Potassium: 4 mEq/L (ref 3.5–5.1)
Sodium: 136 mEq/L (ref 135–145)
Total Protein: 6.4 g/dL (ref 6.0–8.3)

## 2012-11-06 MED ORDER — DIPHENHYDRAMINE HCL 50 MG/ML IJ SOLN
INTRAMUSCULAR | Status: AC
  Filled 2012-11-06: qty 1

## 2012-11-06 MED ORDER — FAMOTIDINE IN NACL 20-0.9 MG/50ML-% IV SOLN
20.0000 mg | Freq: Once | INTRAVENOUS | Status: AC
  Administered 2012-11-06: 20 mg via INTRAVENOUS

## 2012-11-06 MED ORDER — ONDANSETRON HCL 40 MG/20ML IJ SOLN
Freq: Once | INTRAMUSCULAR | Status: AC
Start: 2012-11-06 — End: 2012-11-06
  Administered 2012-11-06: 8 mg via INTRAVENOUS
  Filled 2012-11-06: qty 4

## 2012-11-06 MED ORDER — PACLITAXEL CHEMO INJECTION 300 MG/50ML
80.0000 mg/m2 | Freq: Once | INTRAVENOUS | Status: AC
  Administered 2012-11-06: 162 mg via INTRAVENOUS
  Filled 2012-11-06: qty 27

## 2012-11-06 MED ORDER — FAMOTIDINE IN NACL 20-0.9 MG/50ML-% IV SOLN
INTRAVENOUS | Status: AC
  Filled 2012-11-06: qty 50

## 2012-11-06 MED ORDER — HEPARIN SOD (PORK) LOCK FLUSH 100 UNIT/ML IV SOLN
500.0000 [IU] | Freq: Once | INTRAVENOUS | Status: AC | PRN
  Administered 2012-11-06: 500 [IU]
  Filled 2012-11-06: qty 5

## 2012-11-06 MED ORDER — HEPARIN SOD (PORK) LOCK FLUSH 100 UNIT/ML IV SOLN
INTRAVENOUS | Status: AC
  Filled 2012-11-06: qty 5

## 2012-11-06 MED ORDER — DIPHENHYDRAMINE HCL 50 MG/ML IJ SOLN
50.0000 mg | Freq: Once | INTRAMUSCULAR | Status: AC
  Administered 2012-11-06: 50 mg via INTRAVENOUS

## 2012-11-06 MED ORDER — SODIUM CHLORIDE 0.9 % IV SOLN
12.0000 mg | Freq: Once | INTRAVENOUS | Status: DC
  Filled 2012-11-06: qty 1.2

## 2012-11-06 MED ORDER — SODIUM CHLORIDE 0.9 % IV SOLN
Freq: Once | INTRAVENOUS | Status: AC
  Administered 2012-11-06: 11:00:00 via INTRAVENOUS

## 2012-11-06 NOTE — Progress Notes (Signed)
Tolerated chemo well. 

## 2012-11-09 ENCOUNTER — Other Ambulatory Visit: Payer: Self-pay | Admitting: Certified Registered Nurse Anesthetist

## 2012-11-09 ENCOUNTER — Ambulatory Visit (HOSPITAL_COMMUNITY): Payer: Medicaid Other | Admitting: Oncology

## 2012-11-11 ENCOUNTER — Inpatient Hospital Stay (HOSPITAL_COMMUNITY): Payer: Medicaid Other

## 2012-11-11 ENCOUNTER — Ambulatory Visit (HOSPITAL_COMMUNITY): Payer: Medicaid Other | Admitting: Oncology

## 2012-11-13 ENCOUNTER — Ambulatory Visit (HOSPITAL_COMMUNITY): Payer: Medicaid Other | Admitting: Oncology

## 2012-11-13 ENCOUNTER — Inpatient Hospital Stay (HOSPITAL_COMMUNITY): Payer: Medicaid Other

## 2012-11-16 ENCOUNTER — Encounter (HOSPITAL_BASED_OUTPATIENT_CLINIC_OR_DEPARTMENT_OTHER): Payer: Medicaid Other | Admitting: Oncology

## 2012-11-16 ENCOUNTER — Encounter (HOSPITAL_BASED_OUTPATIENT_CLINIC_OR_DEPARTMENT_OTHER): Payer: Medicaid Other

## 2012-11-16 VITALS — BP 151/90 | HR 96 | Temp 97.9°F | Resp 18 | Wt 208.2 lb

## 2012-11-16 DIAGNOSIS — D649 Anemia, unspecified: Secondary | ICD-10-CM

## 2012-11-16 DIAGNOSIS — C50911 Malignant neoplasm of unspecified site of right female breast: Secondary | ICD-10-CM

## 2012-11-16 DIAGNOSIS — F3289 Other specified depressive episodes: Secondary | ICD-10-CM

## 2012-11-16 DIAGNOSIS — C50919 Malignant neoplasm of unspecified site of unspecified female breast: Secondary | ICD-10-CM

## 2012-11-16 DIAGNOSIS — C50219 Malignant neoplasm of upper-inner quadrant of unspecified female breast: Secondary | ICD-10-CM

## 2012-11-16 DIAGNOSIS — C773 Secondary and unspecified malignant neoplasm of axilla and upper limb lymph nodes: Secondary | ICD-10-CM

## 2012-11-16 DIAGNOSIS — F329 Major depressive disorder, single episode, unspecified: Secondary | ICD-10-CM

## 2012-11-16 DIAGNOSIS — K219 Gastro-esophageal reflux disease without esophagitis: Secondary | ICD-10-CM

## 2012-11-16 DIAGNOSIS — Z5111 Encounter for antineoplastic chemotherapy: Secondary | ICD-10-CM

## 2012-11-16 DIAGNOSIS — C50419 Malignant neoplasm of upper-outer quadrant of unspecified female breast: Secondary | ICD-10-CM

## 2012-11-16 LAB — CBC WITH DIFFERENTIAL/PLATELET
Basophils Absolute: 0 10*3/uL (ref 0.0–0.1)
Eosinophils Relative: 0 % (ref 0–5)
HCT: 31.8 % — ABNORMAL LOW (ref 36.0–46.0)
Hemoglobin: 9.3 g/dL — ABNORMAL LOW (ref 12.0–15.0)
Lymphocytes Relative: 11 % — ABNORMAL LOW (ref 12–46)
MCHC: 29.2 g/dL — ABNORMAL LOW (ref 30.0–36.0)
MCV: 92.7 fL (ref 78.0–100.0)
Monocytes Absolute: 0.1 10*3/uL (ref 0.1–1.0)
Monocytes Relative: 2 % — ABNORMAL LOW (ref 3–12)
Neutro Abs: 5.4 10*3/uL (ref 1.7–7.7)
RDW: 23.1 % — ABNORMAL HIGH (ref 11.5–15.5)
WBC: 6.2 10*3/uL (ref 4.0–10.5)

## 2012-11-16 LAB — COMPREHENSIVE METABOLIC PANEL
ALT: 18 U/L (ref 0–35)
AST: 15 U/L (ref 0–37)
Albumin: 3.2 g/dL — ABNORMAL LOW (ref 3.5–5.2)
Calcium: 9.3 mg/dL (ref 8.4–10.5)
GFR calc Af Amer: >90 mL/min (ref >90)
Sodium: 140 mEq/L (ref 135–145)
Total Protein: 6.4 g/dL (ref 6.0–8.3)

## 2012-11-16 MED ORDER — DIPHENHYDRAMINE HCL 50 MG/ML IJ SOLN
INTRAMUSCULAR | Status: AC
  Filled 2012-11-16: qty 1

## 2012-11-16 MED ORDER — FAMOTIDINE IN NACL 20-0.9 MG/50ML-% IV SOLN
20.0000 mg | Freq: Once | INTRAVENOUS | Status: AC
  Administered 2012-11-16: 20 mg via INTRAVENOUS

## 2012-11-16 MED ORDER — SODIUM CHLORIDE 0.9 % IJ SOLN
10.0000 mL | INTRAMUSCULAR | Status: DC | PRN
  Administered 2012-11-16 (×2): 10 mL
  Filled 2012-11-16: qty 10

## 2012-11-16 MED ORDER — DIPHENHYDRAMINE HCL 50 MG/ML IJ SOLN
50.0000 mg | Freq: Once | INTRAMUSCULAR | Status: AC
  Administered 2012-11-16: 50 mg via INTRAVENOUS

## 2012-11-16 MED ORDER — HEPARIN SOD (PORK) LOCK FLUSH 100 UNIT/ML IV SOLN
500.0000 [IU] | Freq: Once | INTRAVENOUS | Status: AC | PRN
  Administered 2012-11-16: 500 [IU]
  Filled 2012-11-16: qty 5

## 2012-11-16 MED ORDER — HEPARIN SOD (PORK) LOCK FLUSH 100 UNIT/ML IV SOLN
INTRAVENOUS | Status: AC
  Filled 2012-11-16: qty 5

## 2012-11-16 MED ORDER — PACLITAXEL CHEMO INJECTION 300 MG/50ML
80.0000 mg/m2 | Freq: Once | INTRAVENOUS | Status: AC
  Administered 2012-11-16: 162 mg via INTRAVENOUS
  Filled 2012-11-16: qty 27

## 2012-11-16 MED ORDER — SODIUM CHLORIDE 0.9 % IV SOLN
Freq: Once | INTRAVENOUS | Status: AC
  Administered 2012-11-16: 10:00:00 via INTRAVENOUS

## 2012-11-16 MED ORDER — SODIUM CHLORIDE 0.9 % IV SOLN
Freq: Once | INTRAVENOUS | Status: AC
  Administered 2012-11-16: 8 mg via INTRAVENOUS
  Filled 2012-11-16: qty 4

## 2012-11-16 MED ORDER — FAMOTIDINE IN NACL 20-0.9 MG/50ML-% IV SOLN
INTRAVENOUS | Status: AC
  Filled 2012-11-16: qty 50

## 2012-11-16 MED ORDER — OMEPRAZOLE 20 MG PO CPDR: 20.0000 mg | DELAYED_RELEASE_CAPSULE | Freq: Every day | ORAL | Status: DC

## 2012-11-16 NOTE — Progress Notes (Signed)
[name], NP No address on file  Infiltrating ductal carcinoma of breast, right  CURRENT THERAPY: S/P 4 cycles of AC (07/30/2012- 10/08/2012) and now S/P 2 cycles of weekly Paclitaxel x 12 starting on 10/28/2012, here to start cycle 3.   INTERVAL HISTORY: DELLENE Orr 52 y.o. female returns for  regular  visit for followup of Stage IIIa right-sided, grade 2, infiltrating ductal carcinoma with 3 separate primaries giving her multicentric disease in the upper outer and upper inner quadrants of the right breast complicated by 4 positive lymph nodes, all with extracapsular extension, with metastases greater than 2 cm within the lymph nodes. 2 of the 3 tumors were analyzed for ER and PR receptor status. Both tumors are strongly positive for estrogen receptors and progesterone receptors. The one tumor was 79% ER positive and the second was 76% PR positive. Both were 100% PR positive. HER-2/neu was not amplified in any of the 3 tumors. Ki-67 marker was under 20% in both tumors. S/P right modified radical mastectomy by Dr. Leticia Penna on 06/08/2012.  She reports that she has tolerated this treatment well thus far.  She denies any nausea or vomiting or bowel disturbances.    She does admit to a cough that is productive of clear/white sputum.  As a result, no antibiotic therapy is indicated at this time.  She denies any fevers or chills.   She has an appt today with behavioral health.  She is encouraged to keep this appointment.  She must be there by her 1 PM appt time or she will have to reschedule.  Our nurses will attempt to complete chemotherapy in time for her to maintain this appt.   She asks for a refill on her omeprazole and this medication was escribed to [facility]  Oncologically, she denies any complaints and ROS questioning is negative.     Past Medical History  Diagnosis Date  . Kidney stones 1989  . GERD (gastroesophageal reflux disease)   . Arthritis   . History of stomach ulcers  2003/2007    Hx of esophageal ulcers and stomach ulcers due to reflux  . Breast cancer 06/08/12    right mastectomy  . Back pain   . Infiltrating ductal carcinoma of breast 07/09/2012    Neijstrom:  chemo  . Chronic leg pain   . Polysubstance abuse   . Depression   . Anxiety     has Infiltrating ductal carcinoma of breast; Hematochezia; Dysphagia; and Anemia on her problem list.     is allergic to citalopram; effexor; motrin; and trazodone and nefazodone.  Ms. Meyering does not currently have medications on file.  Past Surgical History  Procedure Laterality Date  . Esophagogastroduodenoscopy    . Tubal ligation  1989  . Breast surgery      breast biopsy-right  . Mastectomy  06/08/12    right  . Esophageal dilation    . Portacath placement  07/22/2012    Procedure: INSERTION PORT-A-CATH;  Surgeon: Fabio Bering, MD;  Location: [facility];  Service: General;  Laterality: Left;  left subclavian  . Esophagogastroduodenoscopy (egd) with esophageal dilation  10/04/2005    Rourk-Extensive geographic ulcerations distal third of the tubular esoophagus consistent with severe ulcerative relux esophagitis, early stricture formation status post dilation as described above. 2.  Multiple gastric ulcers without bleeding stigmata as described above. otherwise normal stomach.  3. large bulbar ulcer without bleeding stigmata. otherwise D1 and D2 appeared normal.  . Colonoscopy, esophagogastroduodenoscopy (egd) and esophageal dilation  08/21/2012    Procedure: COLONOSCOPY, ESOPHAGOGASTRODUODENOSCOPY (EGD) AND ESOPHAGEAL DILATION;  Surgeon: Corbin Ade, MD;  Location: [facility];  Service: Endoscopy;  Laterality: N/A;  9:30    Denies any headaches, dizziness, double vision, fevers, chills, night sweats, nausea, vomiting, diarrhea, constipation, chest pain, heart palpitations, shortness of breath, blood in stool, black tarry stool, urinary pain, urinary burning, urinary frequency, hematuria.   PHYSICAL  EXAMINATION  ECOG PERFORMANCE STATUS: 1 - Symptomatic but completely ambulatory  There were no vitals filed for this visit.  GENERAL:alert, no distress, well nourished, well developed, comfortable, cooperative, obese, smiling and tired  SKIN: skin color, texture, turgor are normal, no rashes or significant lesions  HEAD: Normocephalic, No masses, lesions, tenderness or abnormalities  EYES: normal, Conjunctiva are pink and non-injected  EARS: External ears normal  OROPHARYNX:mucous membranes are moist and poor dentition  NECK: supple, no adenopathy, thyroid normal size, non-tender, without nodularity, no stridor, non-tender, trachea midline  LYMPH: no palpable lymphadenopathy, no hepatosplenomegaly  BREAST:not examined  LUNGS: clear to auscultation and percussion  HEART: regular rate & rhythm, no murmurs, no gallops, S1 normal and S2 normal  ABDOMEN:abdomen soft, non-tender, obese, normal bowel sounds,  BACK: Back symmetric, no curvature., No CVA tenderness  EXTREMITIES:less then 2 second capillary refill, no joint deformities, effusion, or inflammation, no edema, no skin discoloration, no clubbing, no cyanosis  NEURO: alert & oriented x 3 with fluent speech, no focal motor/sensory deficits, gait normal    LABORATORY DATA: CBC    Component Value Date/Time   WBC 6.2 11/16/2012 0852   RBC 3.43* 11/16/2012 0852   HGB 9.3* 11/16/2012 0852   HCT 31.8* 11/16/2012 0852   PLT 308 11/16/2012 0852   MCV 92.7 11/16/2012 0852   MCH 27.1 11/16/2012 0852   MCHC 29.2* 11/16/2012 0852   RDW 23.1* 11/16/2012 0852   LYMPHSABS 0.7 11/16/2012 0852   MONOABS 0.1 11/16/2012 0852   EOSABS 0.0 11/16/2012 0852   BASOSABS 0.0 11/16/2012 0852      Chemistry      Component Value Date/Time   NA 136 11/06/2012 1021   K 4.0 11/06/2012 1021   CL 101 11/06/2012 1021   CO2 23 11/06/2012 1021   BUN 5* 11/06/2012 1021   CREATININE 0.44* 11/06/2012 1021      Component Value Date/Time   CALCIUM 8.9 11/06/2012 1021    ALKPHOS 89 11/06/2012 1021   AST 35 11/06/2012 1021   ALT 32 11/06/2012 1021   BILITOT 0.4 11/06/2012 1021       PATHOLOGY: 06/08/2012  Diagnosis  1. Lymph node, sentinel, biopsy, right axillary  - ONE BENIGN LYMPH NODE, NEGATIVE FOR METASTATIC CARCINOMA (0/1).  2. Breast, modified radical mastectomy , right  - NO RESIDUAL CARCINOMA.  - FOCAL ATYPICAL DUCTAL HYPERPLASIA.  - EXTENSIVE PREVIOUS EXCISIONAL SITE CHANGES.  - INKED NEW RESECTION MARGINS, NEGATIVE FOR ATYPIA OR MALIGNANCY.  - FOUR OF NINE LYMPH NODES, POSITIVE FOR METASTATIC CARCINOMA WITH EXTRANODAL  EXTENSION (4/9).  - PLEASE SEE COMMENT.  Microscopic Comment  2. BREAST, INVASIVE TUMOR, WITH LYMPH NODE SAMPLING  PLEASE CORRELATE WITH PREVIOUS CASE [id number] FOR DETAILS.  Lymph nodes:  # examined: 10  Lymph nodes with metastasis:4  Isolated tumor cells (< 0.2 mm): 0  Micrometastasis: (> 0.2 mm and < 2.0 mm): 0  Macrometastasis: (> 2.0 mm): 2  Extracapsular extension: 2  TNM: mpT2, Gwenette Greet MD  Pathologist, Electronic Signature  (Case signed 06/10/2012)     ASSESSMENT:  1. Stage IIIa right-sided, grade 2, infiltrating ductal carcinoma with 3 separate primaries giving her multicentric disease in the upper outer and upper inner quadrants of the right breast complicated by 4 positive lymph nodes, all with extracapsular extension, with metastases greater than 2 cm within the lymph nodes. 2 of the 3 tumors were analyzed for ER and PR receptor status. Both tumors are strongly positive for estrogen receptors and progesterone receptors. The one tumor was 79% ER positive and the second was 76% PR positive. Both were 100% PR positive. HER-2/neu was not amplified in any of the 3 tumors. Ki-67 marker was under 20% in both tumors. S/P right modified radical mastectomy by Dr. Leticia Penna on 06/08/2012. S/P 4 cycles of AC beginning (07/30/2012- 10/08/12), and now on weekly Paclitaxel  2. Chronic depression with anxiety times  several years.  3. Probable asthma with wheezing bilaterally  4. Obesity  5. Poor dental hygiene  6. Chronic low back pain  7. Normocytic anemia unclear etiology  8. Herpes zoster of C5 dermatome, resolved after appropriate treatment.  9. History of EtOH abuse drinking 12-15 beers daily.  10. Depression, untreated, followed by behavioral health  11. 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 Insomnia, failed OTC medications    PLAN:  1. I personally reviewed and went over laboratory results with the patient. 2. Pre-chemo labs: CBC diff and CMET (every 3 weeks) 3. Omeprazole refill escribed to [facility] 4. Continue behavioral health follow-up 5. Return as scheduled for follow-up on 4/16  All questions were answered. The patient knows to call the clinic with any problems, questions or concerns. We can certainly see the patient much sooner if necessary.  The patient and plan discussed with Glenford Peers, MD and he is in agreement with the aforementioned.  KEFALAS,THOMAS

## 2012-11-16 NOTE — Progress Notes (Signed)
Tolerated taxol iv well.

## 2012-11-16 NOTE — Patient Instructions (Addendum)
[facility] Discharge Instructions  RECOMMENDATIONS MADE BY THE CONSULTANT AND ANY TEST RESULTS WILL BE SENT TO YOUR REFERRING PHYSICIAN.  MEDICATIONS PRESCRIBED:  Refill on Omeprazole sent to [facility]  INSTRUCTIONS GIVEN AND DISCUSSED: Follow-up with Behavioral Health as scheduled  SPECIAL INSTRUCTIONS/FOLLOW-UP: Return as scheduled for follow-up.  Thank you for choosing [facility] to provide your oncology and hematology care.  To afford each patient quality time with our providers, please arrive at least 15 minutes before your scheduled appointment time.  With your help, our goal is to use those 15 minutes to complete the necessary work-up to ensure our physicians have the information they need to help with your evaluation and healthcare recommendations.    Effective January 1st, 2014, we ask that you re-schedule your appointment with our physicians should you arrive 10 or more minutes late for your appointment.  We strive to give you quality time with our providers, and arriving late affects you and other patients whose appointments are after yours.    Again, thank you for choosing [facility].  Our hope is that these requests will decrease the amount of time that you wait before being seen by our physicians.       _____________________________________________________________  Should you have questions after your visit to [facility], please contact our office at [phone] between the hours of 8:30 a.m. and 5:00 p.m.  Voicemails left after 4:30 p.m. will not be returned until the following business day.  For prescription refill requests, have your pharmacy contact our office with your prescription refill request.

## 2012-11-17 ENCOUNTER — Ambulatory Visit (HOSPITAL_COMMUNITY): Payer: Medicaid Other | Admitting: Oncology

## 2012-11-18 ENCOUNTER — Inpatient Hospital Stay (HOSPITAL_COMMUNITY): Payer: Medicaid Other

## 2012-11-20 ENCOUNTER — Inpatient Hospital Stay (HOSPITAL_COMMUNITY): Payer: Medicaid Other

## 2012-11-23 ENCOUNTER — Encounter (HOSPITAL_COMMUNITY): Payer: Medicaid Other | Attending: Oncology

## 2012-11-23 VITALS — BP 134/87 | HR 98 | Temp 98.2°F | Resp 18 | Wt 212.8 lb

## 2012-11-23 DIAGNOSIS — C50911 Malignant neoplasm of unspecified site of right female breast: Secondary | ICD-10-CM

## 2012-11-23 DIAGNOSIS — R059 Cough, unspecified: Secondary | ICD-10-CM | POA: Insufficient documentation

## 2012-11-23 DIAGNOSIS — R05 Cough: Secondary | ICD-10-CM | POA: Insufficient documentation

## 2012-11-23 DIAGNOSIS — C50919 Malignant neoplasm of unspecified site of unspecified female breast: Secondary | ICD-10-CM | POA: Insufficient documentation

## 2012-11-23 DIAGNOSIS — C50419 Malignant neoplasm of upper-outer quadrant of unspecified female breast: Secondary | ICD-10-CM

## 2012-11-23 DIAGNOSIS — C773 Secondary and unspecified malignant neoplasm of axilla and upper limb lymph nodes: Secondary | ICD-10-CM

## 2012-11-23 DIAGNOSIS — C50219 Malignant neoplasm of upper-inner quadrant of unspecified female breast: Secondary | ICD-10-CM

## 2012-11-23 DIAGNOSIS — Z5111 Encounter for antineoplastic chemotherapy: Secondary | ICD-10-CM

## 2012-11-23 LAB — CBC WITH DIFFERENTIAL/PLATELET
Basophils Absolute: 0 10*3/uL (ref 0.0–0.1)
Basophils Relative: 0 % (ref 0–1)
Eosinophils Relative: 0 % (ref 0–5)
Hemoglobin: 9.2 g/dL — ABNORMAL LOW (ref 12.0–15.0)
MCH: 28 pg (ref 26.0–34.0)
MCV: 92.1 fL (ref 78.0–100.0)
Monocytes Absolute: 0.1 10*3/uL (ref 0.1–1.0)
Neutrophils Relative %: 86 % — ABNORMAL HIGH (ref 43–77)
Platelets: 285 10*3/uL (ref 150–400)
RBC: 3.28 MIL/uL — ABNORMAL LOW (ref 3.87–5.11)
WBC: 7 10*3/uL (ref 4.0–10.5)

## 2012-11-23 LAB — COMPREHENSIVE METABOLIC PANEL
AST: 17 U/L (ref 0–37)
Albumin: 3.1 g/dL — ABNORMAL LOW (ref 3.5–5.2)
Calcium: 8.8 mg/dL (ref 8.4–10.5)
Chloride: 100 mEq/L (ref 96–112)
Creatinine, Ser: 0.59 mg/dL (ref 0.50–1.10)

## 2012-11-23 MED ORDER — SODIUM CHLORIDE 0.9 % IV SOLN
Freq: Once | INTRAVENOUS | Status: AC
  Administered 2012-11-23: 12:00:00 via INTRAVENOUS

## 2012-11-23 MED ORDER — SODIUM CHLORIDE 0.9 % IV SOLN
Freq: Once | INTRAVENOUS | Status: AC
  Administered 2012-11-23: 8 mg via INTRAVENOUS
  Filled 2012-11-23: qty 4

## 2012-11-23 MED ORDER — HEPARIN SOD (PORK) LOCK FLUSH 100 UNIT/ML IV SOLN
INTRAVENOUS | Status: AC
  Filled 2012-11-23: qty 5

## 2012-11-23 MED ORDER — DIPHENHYDRAMINE HCL 50 MG/ML IJ SOLN
INTRAMUSCULAR | Status: AC
  Filled 2012-11-23: qty 1

## 2012-11-23 MED ORDER — PACLITAXEL CHEMO INJECTION 300 MG/50ML
80.0000 mg/m2 | Freq: Once | INTRAVENOUS | Status: AC
  Administered 2012-11-23: 162 mg via INTRAVENOUS
  Filled 2012-11-23: qty 27

## 2012-11-23 MED ORDER — SODIUM CHLORIDE 0.9 % IJ SOLN
10.0000 mL | INTRAMUSCULAR | Status: DC | PRN
  Administered 2012-11-23: 10 mL
  Filled 2012-11-23: qty 10

## 2012-11-23 MED ORDER — DIPHENHYDRAMINE HCL 50 MG/ML IJ SOLN
50.0000 mg | Freq: Once | INTRAMUSCULAR | Status: AC
  Administered 2012-11-23: 50 mg via INTRAVENOUS

## 2012-11-23 MED ORDER — HEPARIN SOD (PORK) LOCK FLUSH 100 UNIT/ML IV SOLN
500.0000 [IU] | Freq: Once | INTRAVENOUS | Status: AC | PRN
  Administered 2012-11-23: 500 [IU]
  Filled 2012-11-23: qty 5

## 2012-11-23 MED ORDER — FAMOTIDINE IN NACL 20-0.9 MG/50ML-% IV SOLN
20.0000 mg | Freq: Once | INTRAVENOUS | Status: AC
  Administered 2012-11-23: 20 mg via INTRAVENOUS

## 2012-11-23 MED ORDER — FAMOTIDINE IN NACL 20-0.9 MG/50ML-% IV SOLN
INTRAVENOUS | Status: AC
  Filled 2012-11-23: qty 50

## 2012-11-23 NOTE — Progress Notes (Signed)
Tolerated chemo well today.  Home accompanied by spouse.

## 2012-11-25 ENCOUNTER — Inpatient Hospital Stay (HOSPITAL_COMMUNITY): Payer: Medicaid Other

## 2012-11-27 ENCOUNTER — Inpatient Hospital Stay (HOSPITAL_COMMUNITY): Payer: Medicaid Other

## 2012-11-30 ENCOUNTER — Inpatient Hospital Stay (HOSPITAL_COMMUNITY): Payer: Medicaid Other

## 2012-12-02 ENCOUNTER — Ambulatory Visit (HOSPITAL_COMMUNITY): Payer: Medicaid Other | Admitting: Oncology

## 2012-12-02 ENCOUNTER — Inpatient Hospital Stay (HOSPITAL_COMMUNITY): Payer: Medicaid Other

## 2012-12-03 ENCOUNTER — Inpatient Hospital Stay (HOSPITAL_COMMUNITY): Payer: Medicaid Other

## 2012-12-08 ENCOUNTER — Other Ambulatory Visit (HOSPITAL_COMMUNITY)
Admission: RE | Admit: 2012-12-08 | Discharge: 2012-12-08 | Disposition: A | Discharge status: Home / Self Care | Payer: Medicaid Other | Source: Ambulatory Visit | Attending: Adult Health | Admitting: Adult Health

## 2012-12-08 ENCOUNTER — Ambulatory Visit (INDEPENDENT_AMBULATORY_CARE_PROVIDER_SITE_OTHER): Payer: Medicaid Other | Admitting: Adult Health

## 2012-12-08 ENCOUNTER — Encounter: Payer: Self-pay | Admitting: Adult Health

## 2012-12-08 VITALS — BP 108/66 | HR 82 | Ht 60.0 in | Wt 219.0 lb

## 2012-12-08 DIAGNOSIS — N949 Unspecified condition associated with female genital organs and menstrual cycle: Secondary | ICD-10-CM

## 2012-12-08 DIAGNOSIS — Z0000 Encounter for general adult medical examination without abnormal findings: Secondary | ICD-10-CM

## 2012-12-08 DIAGNOSIS — Z853 Personal history of malignant neoplasm of breast: Secondary | ICD-10-CM

## 2012-12-08 DIAGNOSIS — C50911 Malignant neoplasm of unspecified site of right female breast: Secondary | ICD-10-CM

## 2012-12-08 DIAGNOSIS — Z01419 Encounter for gynecological examination (general) (routine) without abnormal findings: Secondary | ICD-10-CM | POA: Insufficient documentation

## 2012-12-08 DIAGNOSIS — C50919 Malignant neoplasm of unspecified site of unspecified female breast: Secondary | ICD-10-CM

## 2012-12-08 DIAGNOSIS — Z1151 Encounter for screening for human papillomavirus (HPV): Secondary | ICD-10-CM | POA: Insufficient documentation

## 2012-12-08 DIAGNOSIS — Z1212 Encounter for screening for malignant neoplasm of rectum: Secondary | ICD-10-CM

## 2012-12-08 DIAGNOSIS — R059 Cough, unspecified: Secondary | ICD-10-CM

## 2012-12-08 DIAGNOSIS — R05 Cough: Secondary | ICD-10-CM

## 2012-12-08 LAB — HEMOCCULT GUIAC POC 1CARD (OFFICE): Fecal Occult Blood, POC: NEGATIVE

## 2012-12-08 NOTE — Patient Instructions (Addendum)
Get chest xray  Return 6 days for ultrasound and see me Gargle with warm salt water Try ativan at night

## 2012-12-08 NOTE — Progress Notes (Signed)
Patient ID: Elizabeth Orr, female   DOB: [birth year], 52 y.o.   MRN: [id number] History of Present Illness: Elizabeth Orr is a 52 year old white female, married in for her pap and physical. She is new to this office, and she complains of a cough, that is worse at night. She tried Nyquil without relief. She says she had a cold and her throat is scratchy. She is currently taking chemotherapy and has lost her hair. She says she will begin radiation soon.   Current Medications, Allergies, Past Medical History, Past Surgical History, Family History and Social History were reviewed in [employer] record.     Review of Systems: Patient denies any headaches, blurred vision, shortness of breath, chest pain, abdominal pain, problems with bowel movements, urination, or intercourse. She has not had sex lately and she has a history of constipation and uses MOM.She complains of a cough that is worse at night and she spits up mucous at times.She had a EGD and colonoscopy this year.She says she does not sleep well. No musculoskeletal complaints, she says she has medication for depression but she does not take it. She does not have PCP,she sees Dr. Mariel Sleet, who is her oncologist.   Physical Exam:Blood pressure 108/66, pulse 82, height 5' (1.524 m), weight 219 lb (99.338 kg). General:  Well developed, well nourished, no acute distress Skin:  Warm and dry Neck:  Midline trachea, normal thyroid, throat has mild redness, no swelling noted.No lymph nodes felt. Lungs; Clear to auscultation bilaterally, but she coughs frequently. Breast:  No dominant palpable mass, retraction, or nipple discharge on left. The right breast is absent,no mass felt on chest wall. Port-a-cath left chest. Cardiovascular: Regular rate and rhythm Abdomen:  Soft, non tender, no hepatosplenomegaly Pelvic:  External genitalia is normal in appearance.  The vagina is normal in appearance for her age.       The cervix is bulbous.   Uterus is felt to be normal size, shape, and contour.  No adnexal masses felt she does have tenderness across pelvic area. Rectal: Good sphincter tone, no polyps, she does have a hemorrhoid.  Hemoccult negative. Extremities:  No swelling or varicosities noted Psych: Alert and cooperative,seems to be in good spirits  Impression: Yearly exam History of breast cancer(right) Cough Pelvic pain on exam  Plan: Order given to get chest xray, will follow up after results by phone Pelvic ultrasound in about 6 days to assess pelvic pain and see me afterwards Try warm salt water gargles and delsym Take ativan at hs, she has prescription Mammogram per Dr. Mariel Sleet Return in 1 year for physical

## 2012-12-09 ENCOUNTER — Inpatient Hospital Stay (HOSPITAL_COMMUNITY): Payer: Medicaid Other

## 2012-12-11 ENCOUNTER — Ambulatory Visit (HOSPITAL_COMMUNITY): Payer: Medicaid Other | Admitting: Oncology

## 2012-12-14 ENCOUNTER — Telehealth: Payer: Self-pay | Admitting: Adult Health

## 2012-12-14 ENCOUNTER — Ambulatory Visit: Payer: Medicaid Other

## 2012-12-14 ENCOUNTER — Ambulatory Visit: Payer: Medicaid Other | Admitting: Adult Health

## 2012-12-14 ENCOUNTER — Ambulatory Visit (HOSPITAL_COMMUNITY)
Admission: RE | Admit: 2012-12-14 | Discharge: 2012-12-14 | Disposition: A | Discharge status: Home / Self Care | Payer: Medicaid Other | Source: Ambulatory Visit | Attending: Oncology | Admitting: Oncology

## 2012-12-14 ENCOUNTER — Encounter (HOSPITAL_BASED_OUTPATIENT_CLINIC_OR_DEPARTMENT_OTHER): Payer: Medicaid Other

## 2012-12-14 VITALS — BP 134/73 | HR 112 | Temp 97.9°F | Resp 20 | Wt 217.0 lb

## 2012-12-14 DIAGNOSIS — I517 Cardiomegaly: Secondary | ICD-10-CM | POA: Insufficient documentation

## 2012-12-14 DIAGNOSIS — C50911 Malignant neoplasm of unspecified site of right female breast: Secondary | ICD-10-CM

## 2012-12-14 DIAGNOSIS — R05 Cough: Secondary | ICD-10-CM

## 2012-12-14 DIAGNOSIS — C50919 Malignant neoplasm of unspecified site of unspecified female breast: Secondary | ICD-10-CM | POA: Insufficient documentation

## 2012-12-14 DIAGNOSIS — R059 Cough, unspecified: Secondary | ICD-10-CM | POA: Insufficient documentation

## 2012-12-14 DIAGNOSIS — R058 Other specified cough: Secondary | ICD-10-CM

## 2012-12-14 DIAGNOSIS — C50419 Malignant neoplasm of upper-outer quadrant of unspecified female breast: Secondary | ICD-10-CM

## 2012-12-14 DIAGNOSIS — R0602 Shortness of breath: Secondary | ICD-10-CM | POA: Insufficient documentation

## 2012-12-14 NOTE — Progress Notes (Signed)
Patient has BLE edema. SOB when walking. Has had a cough/cold with green mucus production for 3 weeks now. No fever/chills. We are holding chemo today and sending her for a CXR. She is to return on Monday 12/21/12 for chemo. Augmentin 875 1 tab bid x 7 days and Tussionex 1 tsp at bedtime 60ml no refills called in to [facility]. These were Dr. Thornton Papas orders.

## 2012-12-14 NOTE — Telephone Encounter (Signed)
Hailey at Dr. Benjaman Pott office called earlier and said Koren was there and had not gotten her chest xray so they ordered one, and it came back negative. I let her know pt. No showed for her follow up US today.

## 2012-12-21 ENCOUNTER — Inpatient Hospital Stay (HOSPITAL_COMMUNITY): Payer: Medicaid Other

## 2012-12-22 ENCOUNTER — Encounter (HOSPITAL_COMMUNITY): Payer: Self-pay

## 2012-12-24 ENCOUNTER — Encounter: Payer: Self-pay | Admitting: *Deleted

## 2012-12-24 ENCOUNTER — Other Ambulatory Visit: Payer: Medicaid Other

## 2012-12-25 ENCOUNTER — Inpatient Hospital Stay (HOSPITAL_COMMUNITY): Payer: Medicaid Other

## 2012-12-25 ENCOUNTER — Telehealth (HOSPITAL_COMMUNITY): Payer: Self-pay | Admitting: *Deleted

## 2012-12-25 NOTE — Telephone Encounter (Signed)
Elizabeth Orr informed us that Elizabeth Orr cancelled again today. Husband has court today and she has to go with him. Patient has not been treated with weekly taxol since 4/7.

## 2012-12-25 NOTE — Telephone Encounter (Signed)
Patient called into today to [facility] and said that her ride that she had lined up to take her to her chemo tomorrow cancelled on her. (Today is Friday). Mazzie reminded her that tomorrow was Saturday and patient said "really". Patient had slurred speech. Mazzie also spoke with patient this am and Mazzie said that pt was not like that this am at all.

## 2012-12-28 ENCOUNTER — Encounter (HOSPITAL_COMMUNITY): Payer: Medicaid Other

## 2012-12-30 ENCOUNTER — Encounter (HOSPITAL_COMMUNITY): Payer: Medicaid Other | Attending: Oncology

## 2012-12-30 VITALS — BP 129/83 | HR 113 | Temp 97.8°F | Resp 20 | Wt 211.4 lb

## 2012-12-30 DIAGNOSIS — C50919 Malignant neoplasm of unspecified site of unspecified female breast: Secondary | ICD-10-CM

## 2012-12-30 DIAGNOSIS — C50911 Malignant neoplasm of unspecified site of right female breast: Secondary | ICD-10-CM

## 2012-12-30 DIAGNOSIS — D649 Anemia, unspecified: Secondary | ICD-10-CM | POA: Insufficient documentation

## 2012-12-30 DIAGNOSIS — F341 Dysthymic disorder: Secondary | ICD-10-CM | POA: Insufficient documentation

## 2012-12-30 DIAGNOSIS — K219 Gastro-esophageal reflux disease without esophagitis: Secondary | ICD-10-CM

## 2012-12-30 DIAGNOSIS — Z5111 Encounter for antineoplastic chemotherapy: Secondary | ICD-10-CM

## 2012-12-30 DIAGNOSIS — C50419 Malignant neoplasm of upper-outer quadrant of unspecified female breast: Secondary | ICD-10-CM

## 2012-12-30 DIAGNOSIS — R062 Wheezing: Secondary | ICD-10-CM

## 2012-12-30 DIAGNOSIS — F418 Other specified anxiety disorders: Secondary | ICD-10-CM

## 2012-12-30 LAB — CBC WITH DIFFERENTIAL/PLATELET
Basophils Absolute: 0 10*3/uL (ref 0.0–0.1)
Eosinophils Absolute: 0.2 10*3/uL (ref 0.0–0.7)
Lymphocytes Relative: 40 % (ref 12–46)
Lymphs Abs: 2.9 10*3/uL (ref 0.7–4.0)
Neutrophils Relative %: 48 % (ref 43–77)
Platelets: 209 10*3/uL (ref 150–400)
RBC: 3.75 MIL/uL — ABNORMAL LOW (ref 3.87–5.11)
RDW: 19.5 % — ABNORMAL HIGH (ref 11.5–15.5)
WBC: 7.2 10*3/uL (ref 4.0–10.5)

## 2012-12-30 LAB — COMPREHENSIVE METABOLIC PANEL
ALT: 42 U/L — ABNORMAL HIGH (ref 0–35)
AST: 90 U/L — ABNORMAL HIGH (ref 0–37)
Alkaline Phosphatase: 150 U/L — ABNORMAL HIGH (ref 39–117)
Calcium: 8.6 mg/dL (ref 8.4–10.5)
GFR calc Af Amer: >90 mL/min (ref >90)
Glucose, Bld: 100 mg/dL — ABNORMAL HIGH (ref 70–99)
Potassium: 3.2 mEq/L — ABNORMAL LOW (ref 3.5–5.1)
Sodium: 136 mEq/L (ref 135–145)
Total Protein: 6.6 g/dL (ref 6.0–8.3)

## 2012-12-30 MED ORDER — SODIUM CHLORIDE 0.9 % IV SOLN
Freq: Once | INTRAVENOUS | Status: AC
  Administered 2012-12-30: 11:00:00 via INTRAVENOUS

## 2012-12-30 MED ORDER — SODIUM CHLORIDE 0.9 % IV SOLN
Freq: Once | INTRAVENOUS | Status: AC
  Administered 2012-12-30: 8 mg via INTRAVENOUS
  Filled 2012-12-30: qty 4

## 2012-12-30 MED ORDER — DEXAMETHASONE 4 MG PO TABS: ORAL_TABLET | ORAL | Status: DC

## 2012-12-30 MED ORDER — PACLITAXEL CHEMO INJECTION 300 MG/50ML
80.0000 mg/m2 | Freq: Once | INTRAVENOUS | Status: AC
  Administered 2012-12-30: 162 mg via INTRAVENOUS
  Filled 2012-12-30: qty 27

## 2012-12-30 MED ORDER — FAMOTIDINE IN NACL 20-0.9 MG/50ML-% IV SOLN
INTRAVENOUS | Status: AC
  Filled 2012-12-30: qty 50

## 2012-12-30 MED ORDER — HEPARIN SOD (PORK) LOCK FLUSH 100 UNIT/ML IV SOLN
INTRAVENOUS | Status: AC
  Filled 2012-12-30: qty 5

## 2012-12-30 MED ORDER — ALBUTEROL SULFATE HFA 108 (90 BASE) MCG/ACT IN AERS: 2.0000 | INHALATION_SPRAY | Freq: Four times a day (QID) | RESPIRATORY_TRACT | Status: DC | PRN

## 2012-12-30 MED ORDER — FAMOTIDINE IN NACL 20-0.9 MG/50ML-% IV SOLN
20.0000 mg | Freq: Once | INTRAVENOUS | Status: AC
  Administered 2012-12-30: 20 mg via INTRAVENOUS

## 2012-12-30 MED ORDER — HEPARIN SOD (PORK) LOCK FLUSH 100 UNIT/ML IV SOLN
500.0000 [IU] | Freq: Once | INTRAVENOUS | Status: AC | PRN
  Administered 2012-12-30: 500 [IU]
  Filled 2012-12-30: qty 5

## 2012-12-30 MED ORDER — DIPHENHYDRAMINE HCL 50 MG/ML IJ SOLN
INTRAMUSCULAR | Status: AC
  Filled 2012-12-30: qty 1

## 2012-12-30 MED ORDER — OMEPRAZOLE 20 MG PO CPDR: 20.0000 mg | DELAYED_RELEASE_CAPSULE | Freq: Every day | ORAL | Status: DC

## 2012-12-30 MED ORDER — DIPHENHYDRAMINE HCL 50 MG/ML IJ SOLN
50.0000 mg | Freq: Once | INTRAMUSCULAR | Status: AC
  Administered 2012-12-30: 50 mg via INTRAVENOUS

## 2012-12-30 MED ORDER — SODIUM CHLORIDE 0.9 % IJ SOLN
10.0000 mL | INTRAMUSCULAR | Status: DC | PRN
  Administered 2012-12-30: 10 mL
  Filled 2012-12-30: qty 10

## 2012-12-30 NOTE — Progress Notes (Signed)
Tolerated chemo well. Will continue with chemo per Dr. Mariel Sleet.  Reinforced to pt that chemo treatments need to be regular and as scheduled for chemo to be effective.  Pt verbalized understanding.

## 2013-01-06 ENCOUNTER — Encounter (HOSPITAL_COMMUNITY): Payer: Medicaid Other | Attending: Hematology and Oncology

## 2013-01-06 VITALS — BP 115/73 | HR 104 | Temp 98.9°F | Resp 20 | Wt 208.0 lb

## 2013-01-06 DIAGNOSIS — C50911 Malignant neoplasm of unspecified site of right female breast: Secondary | ICD-10-CM

## 2013-01-06 DIAGNOSIS — Z5111 Encounter for antineoplastic chemotherapy: Secondary | ICD-10-CM

## 2013-01-06 DIAGNOSIS — C50919 Malignant neoplasm of unspecified site of unspecified female breast: Secondary | ICD-10-CM

## 2013-01-06 DIAGNOSIS — C50419 Malignant neoplasm of upper-outer quadrant of unspecified female breast: Secondary | ICD-10-CM

## 2013-01-06 LAB — CBC WITH DIFFERENTIAL/PLATELET
Basophils Absolute: 0 10*3/uL (ref 0.0–0.1)
Basophils Relative: 1 % (ref 0–1)
Eosinophils Absolute: 0.1 10*3/uL (ref 0.0–0.7)
Eosinophils Relative: 4 % (ref 0–5)
HCT: 27.4 % — ABNORMAL LOW (ref 36.0–46.0)
Hemoglobin: 8.5 g/dL — ABNORMAL LOW (ref 12.0–15.0)
MCH: 27.8 pg (ref 26.0–34.0)
MCHC: 31 g/dL (ref 30.0–36.0)
MCV: 89.5 fL (ref 78.0–100.0)
Monocytes Absolute: 0.3 10*3/uL (ref 0.1–1.0)
Monocytes Relative: 7 % (ref 3–12)
Neutro Abs: 2.1 10*3/uL (ref 1.7–7.7)
RDW: 19.1 % — ABNORMAL HIGH (ref 11.5–15.5)

## 2013-01-06 LAB — COMPREHENSIVE METABOLIC PANEL
AST: 45 U/L — ABNORMAL HIGH (ref 0–37)
Albumin: 2.5 g/dL — ABNORMAL LOW (ref 3.5–5.2)
BUN: <3 mg/dL — ABNORMAL LOW (ref 6–23)
Calcium: 8.7 mg/dL (ref 8.4–10.5)
Chloride: 96 mEq/L (ref 96–112)
Creatinine, Ser: 0.43 mg/dL — ABNORMAL LOW (ref 0.50–1.10)
Total Bilirubin: 0.4 mg/dL (ref 0.3–1.2)
Total Protein: 6.5 g/dL (ref 6.0–8.3)

## 2013-01-06 MED ORDER — FAMOTIDINE IN NACL 20-0.9 MG/50ML-% IV SOLN
INTRAVENOUS | Status: AC
  Filled 2013-01-06: qty 50

## 2013-01-06 MED ORDER — ONDANSETRON HCL 40 MG/20ML IJ SOLN
Freq: Once | INTRAMUSCULAR | Status: AC
  Administered 2013-01-06: 8 mg via INTRAVENOUS
  Filled 2013-01-06: qty 4

## 2013-01-06 MED ORDER — FAMOTIDINE IN NACL 20-0.9 MG/50ML-% IV SOLN
20.0000 mg | Freq: Once | INTRAVENOUS | Status: AC
  Administered 2013-01-06: 20 mg via INTRAVENOUS

## 2013-01-06 MED ORDER — PACLITAXEL CHEMO INJECTION 300 MG/50ML
80.0000 mg/m2 | Freq: Once | INTRAVENOUS | Status: AC
  Administered 2013-01-06: 162 mg via INTRAVENOUS
  Filled 2013-01-06: qty 27

## 2013-01-06 MED ORDER — HEPARIN SOD (PORK) LOCK FLUSH 100 UNIT/ML IV SOLN
INTRAVENOUS | Status: AC
  Filled 2013-01-06: qty 5

## 2013-01-06 MED ORDER — SODIUM CHLORIDE 0.9 % IV SOLN
Freq: Once | INTRAVENOUS | Status: AC
  Administered 2013-01-06: 12:00:00 via INTRAVENOUS

## 2013-01-06 MED ORDER — DIPHENHYDRAMINE HCL 50 MG/ML IJ SOLN
INTRAMUSCULAR | Status: AC
  Filled 2013-01-06: qty 1

## 2013-01-06 MED ORDER — SODIUM CHLORIDE 0.9 % IJ SOLN
10.0000 mL | INTRAMUSCULAR | Status: DC | PRN
  Administered 2013-01-06: 10 mL
  Filled 2013-01-06: qty 10

## 2013-01-06 MED ORDER — HEPARIN SOD (PORK) LOCK FLUSH 100 UNIT/ML IV SOLN
500.0000 [IU] | Freq: Once | INTRAVENOUS | Status: AC | PRN
  Administered 2013-01-06: 500 [IU]
  Filled 2013-01-06: qty 5

## 2013-01-06 MED ORDER — DIPHENHYDRAMINE HCL 50 MG/ML IJ SOLN
50.0000 mg | Freq: Once | INTRAMUSCULAR | Status: AC
  Administered 2013-01-06: 50 mg via INTRAVENOUS

## 2013-01-06 NOTE — Progress Notes (Signed)
Tolerated chemo well today. 

## 2013-01-07 ENCOUNTER — Other Ambulatory Visit (HOSPITAL_COMMUNITY): Payer: Self-pay | Admitting: Oncology

## 2013-01-07 DIAGNOSIS — D649 Anemia, unspecified: Secondary | ICD-10-CM

## 2013-01-07 DIAGNOSIS — C50911 Malignant neoplasm of unspecified site of right female breast: Secondary | ICD-10-CM

## 2013-01-08 ENCOUNTER — Encounter (HOSPITAL_BASED_OUTPATIENT_CLINIC_OR_DEPARTMENT_OTHER): Payer: Medicaid Other

## 2013-01-08 VITALS — BP 144/77 | HR 80 | Temp 98.0°F | Resp 20

## 2013-01-08 DIAGNOSIS — D649 Anemia, unspecified: Secondary | ICD-10-CM

## 2013-01-08 DIAGNOSIS — C50911 Malignant neoplasm of unspecified site of right female breast: Secondary | ICD-10-CM

## 2013-01-08 LAB — PREPARE RBC (CROSSMATCH)

## 2013-01-08 MED ORDER — SODIUM CHLORIDE 0.9 % IV SOLN
250.0000 mL | Freq: Once | INTRAVENOUS | Status: AC
  Administered 2013-01-08: 250 mL via INTRAVENOUS

## 2013-01-08 MED ORDER — HEPARIN SOD (PORK) LOCK FLUSH 100 UNIT/ML IV SOLN
250.0000 [IU] | INTRAVENOUS | Status: AC | PRN
  Administered 2013-01-08: 500 [IU]
  Filled 2013-01-08: qty 5

## 2013-01-08 MED ORDER — SODIUM CHLORIDE 0.9 % IJ SOLN
10.0000 mL | INTRAMUSCULAR | Status: AC | PRN
  Administered 2013-01-08: 10 mL
  Filled 2013-01-08: qty 10

## 2013-01-08 NOTE — Progress Notes (Signed)
Tolerated transfusion well.  Home with spouse.

## 2013-01-09 LAB — TYPE AND SCREEN
ABO/RH(D): O POS
Antibody Screen: NEGATIVE
Unit division: 0
Unit division: 0

## 2013-01-13 ENCOUNTER — Telehealth: Payer: Self-pay | Admitting: *Deleted

## 2013-01-13 ENCOUNTER — Encounter (HOSPITAL_BASED_OUTPATIENT_CLINIC_OR_DEPARTMENT_OTHER): Payer: Medicaid Other

## 2013-01-13 ENCOUNTER — Ambulatory Visit: Payer: Medicaid Other

## 2013-01-13 ENCOUNTER — Encounter (HOSPITAL_BASED_OUTPATIENT_CLINIC_OR_DEPARTMENT_OTHER): Payer: Medicaid Other | Admitting: Oncology

## 2013-01-13 ENCOUNTER — Ambulatory Visit (HOSPITAL_COMMUNITY)
Admission: RE | Admit: 2013-01-13 | Discharge: 2013-01-13 | Disposition: A | Discharge status: Home / Self Care | Payer: Medicaid Other | Source: Ambulatory Visit | Attending: Oncology | Admitting: Oncology

## 2013-01-13 VITALS — BP 143/83 | HR 98 | Temp 97.3°F | Resp 24 | Wt 203.0 lb

## 2013-01-13 DIAGNOSIS — R05 Cough: Secondary | ICD-10-CM | POA: Insufficient documentation

## 2013-01-13 DIAGNOSIS — C50419 Malignant neoplasm of upper-outer quadrant of unspecified female breast: Secondary | ICD-10-CM

## 2013-01-13 DIAGNOSIS — C50919 Malignant neoplasm of unspecified site of unspecified female breast: Secondary | ICD-10-CM

## 2013-01-13 DIAGNOSIS — Z87891 Personal history of nicotine dependence: Secondary | ICD-10-CM | POA: Insufficient documentation

## 2013-01-13 DIAGNOSIS — Z5111 Encounter for antineoplastic chemotherapy: Secondary | ICD-10-CM

## 2013-01-13 DIAGNOSIS — J988 Other specified respiratory disorders: Secondary | ICD-10-CM

## 2013-01-13 DIAGNOSIS — R062 Wheezing: Secondary | ICD-10-CM | POA: Insufficient documentation

## 2013-01-13 DIAGNOSIS — J984 Other disorders of lung: Secondary | ICD-10-CM

## 2013-01-13 DIAGNOSIS — C50911 Malignant neoplasm of unspecified site of right female breast: Secondary | ICD-10-CM

## 2013-01-13 DIAGNOSIS — R059 Cough, unspecified: Secondary | ICD-10-CM | POA: Insufficient documentation

## 2013-01-13 LAB — CBC WITH DIFFERENTIAL/PLATELET
Eosinophils Absolute: 0 10*3/uL (ref 0.0–0.7)
HCT: 34.7 % — ABNORMAL LOW (ref 36.0–46.0)
Hemoglobin: 11.2 g/dL — ABNORMAL LOW (ref 12.0–15.0)
Lymphs Abs: 1.3 10*3/uL (ref 0.7–4.0)
MCH: 28.8 pg (ref 26.0–34.0)
Monocytes Relative: 3 % (ref 3–12)
Neutro Abs: 2.9 10*3/uL (ref 1.7–7.7)
Neutrophils Relative %: 68 % (ref 43–77)
RBC: 3.89 MIL/uL (ref 3.87–5.11)

## 2013-01-13 MED ORDER — PACLITAXEL CHEMO INJECTION 300 MG/50ML
80.0000 mg/m2 | Freq: Once | INTRAVENOUS | Status: AC
  Administered 2013-01-13: 162 mg via INTRAVENOUS
  Filled 2013-01-13: qty 27

## 2013-01-13 MED ORDER — HEPARIN SOD (PORK) LOCK FLUSH 100 UNIT/ML IV SOLN
INTRAVENOUS | Status: AC
  Filled 2013-01-13: qty 5

## 2013-01-13 MED ORDER — ALBUTEROL SULFATE (5 MG/ML) 0.5% IN NEBU
INHALATION_SOLUTION | RESPIRATORY_TRACT | Status: AC
  Filled 2013-01-13: qty 0.5

## 2013-01-13 MED ORDER — DIPHENHYDRAMINE HCL 50 MG/ML IJ SOLN
INTRAMUSCULAR | Status: AC
  Filled 2013-01-13: qty 1

## 2013-01-13 MED ORDER — IPRATROPIUM BROMIDE 0.02 % IN SOLN
0.5000 mg | Freq: Once | RESPIRATORY_TRACT | Status: DC
  Filled 2013-01-13: qty 2.5

## 2013-01-13 MED ORDER — ALBUTEROL SULFATE (5 MG/ML) 0.5% IN NEBU
2.5000 mg | INHALATION_SOLUTION | Freq: Once | RESPIRATORY_TRACT | Status: AC
  Administered 2013-01-13: 2.5 mg via RESPIRATORY_TRACT

## 2013-01-13 MED ORDER — FAMOTIDINE IN NACL 20-0.9 MG/50ML-% IV SOLN
INTRAVENOUS | Status: AC
  Filled 2013-01-13: qty 50

## 2013-01-13 MED ORDER — HEPARIN SOD (PORK) LOCK FLUSH 100 UNIT/ML IV SOLN
500.0000 [IU] | Freq: Once | INTRAVENOUS | Status: AC | PRN
  Administered 2013-01-13: 500 [IU]
  Filled 2013-01-13: qty 5

## 2013-01-13 MED ORDER — FAMOTIDINE IN NACL 20-0.9 MG/50ML-% IV SOLN
20.0000 mg | Freq: Once | INTRAVENOUS | Status: AC
  Administered 2013-01-13: 20 mg via INTRAVENOUS

## 2013-01-13 MED ORDER — SODIUM CHLORIDE 0.9 % IV SOLN
Freq: Once | INTRAVENOUS | Status: AC
  Administered 2013-01-13: 8 mg via INTRAVENOUS
  Filled 2013-01-13: qty 4

## 2013-01-13 MED ORDER — IPRATROPIUM-ALBUTEROL 0.5-2.5 (3) MG/3ML IN SOLN: 3.0000 mL | Freq: Once | RESPIRATORY_TRACT | Status: DC

## 2013-01-13 MED ORDER — SODIUM CHLORIDE 0.9 % IV SOLN
Freq: Once | INTRAVENOUS | Status: AC
  Administered 2013-01-13: 11:00:00 via INTRAVENOUS

## 2013-01-13 MED ORDER — SODIUM CHLORIDE 0.9 % IN NEBU
INHALATION_SOLUTION | RESPIRATORY_TRACT | Status: AC
  Filled 2013-01-13: qty 3

## 2013-01-13 MED ORDER — ALBUTEROL SULFATE (5 MG/ML) 0.5% IN NEBU: 2.5000 mg | INHALATION_SOLUTION | Freq: Once | RESPIRATORY_TRACT | Status: DC

## 2013-01-13 MED ORDER — DIPHENHYDRAMINE HCL 50 MG/ML IJ SOLN
50.0000 mg | Freq: Once | INTRAMUSCULAR | Status: AC
  Administered 2013-01-13: 50 mg via INTRAVENOUS

## 2013-01-13 MED ORDER — AZITHROMYCIN 250 MG PO TABS: ORAL_TABLET | ORAL | Status: DC

## 2013-01-13 NOTE — Telephone Encounter (Signed)
Complains of pelvic pain, missed appt today.Has appt in am for [place], try tylenol tonite

## 2013-01-13 NOTE — Telephone Encounter (Signed)
Tried to call pt but voice mailbox hasn't been set up yet. JSY

## 2013-01-13 NOTE — Progress Notes (Signed)
Patient is here today for chemotherapy as scheduled.   She reports a cough and does not feel well and therefore I was asked to see the patient today as a work-in.  [name] reports that she has a cough productive of clear sputum and green sputum.  She denies any fevers or shaking chills at home.  She reports that she is taking a inhaler at home and it is effective for her.  She takes it every 4 hours, but between doses, she has has worsening SOB/dyspnea.   I personally reviewed and went over laboratory results with the patient.  Labs are good for chemotherapy administration.  She will receive her scheduled dose of Taxol.   Patient admits to a history of smoking tobacco , but has "not smoked in many years."  PE: General: Ill appearing. Pleasant.  Coughing HEENT: Atraumatic Neck: Trachea midline, supple Cardiac: RRR without murmur rub or gallop Lungs: Rhonchi in Left and Right middle lobes, wheezes in right lower lobe and middle lobe. Skin: Warm and dry Neuro: A and O x 3.  No focal deficits noted.   Assessment: 1. Respiratory tract infection 2. Lung disease exacerbation 3. Breast Cancer  Plan: 1. Duoneb treatment today 2. Chest Xray today 3. Z-Pak escribed to [facility]  Patient and plan discussed with Dr. Glenford Peers and he is in agreement with the aforementioned.  Elizabeth Orr

## 2013-01-13 NOTE — Telephone Encounter (Signed)
Spoke with pt. Having pain in right and left sides. Had to reschedule appt. Can you call in pain meds to [facility]? Allergic to Ibuprofen. Thanks!!

## 2013-01-14 ENCOUNTER — Ambulatory Visit (INDEPENDENT_AMBULATORY_CARE_PROVIDER_SITE_OTHER): Payer: Medicaid Other

## 2013-01-14 DIAGNOSIS — N949 Unspecified condition associated with female genital organs and menstrual cycle: Secondary | ICD-10-CM

## 2013-01-14 DIAGNOSIS — Z853 Personal history of malignant neoplasm of breast: Secondary | ICD-10-CM

## 2013-01-15 ENCOUNTER — Telehealth: Payer: Self-pay | Admitting: Adult Health

## 2013-01-15 MED ORDER — HYDROCODONE-ACETAMINOPHEN 5-325 MG PO TABS: 1.0000 | ORAL_TABLET | Freq: Four times a day (QID) | ORAL | Status: DC | PRN

## 2013-01-15 NOTE — Telephone Encounter (Signed)
Complains of pain, will call in norco 5/325 mg #20 no refills to [facility] and schedule follow up appt next week

## 2013-01-18 ENCOUNTER — Telehealth: Payer: Self-pay | Admitting: Adult Health

## 2013-01-18 NOTE — Telephone Encounter (Signed)
No mailbox 

## 2013-01-19 ENCOUNTER — Telehealth: Payer: Self-pay | Admitting: Adult Health

## 2013-01-19 NOTE — Telephone Encounter (Signed)
Spoke with pt. She stated she has a cyst on ovary which is causing her a lot of pain in stomach. She is requesting something for pain. We have no available appts before June 12 according to the front. What do you advise? Uses [facility].

## 2013-01-19 NOTE — Telephone Encounter (Signed)
No voice mail box.

## 2013-01-20 ENCOUNTER — Telehealth: Payer: Self-pay | Admitting: Adult Health

## 2013-01-20 ENCOUNTER — Inpatient Hospital Stay (HOSPITAL_COMMUNITY): Payer: Medicaid Other

## 2013-01-20 ENCOUNTER — Telehealth (HOSPITAL_COMMUNITY): Payer: Self-pay | Admitting: *Deleted

## 2013-01-20 NOTE — Telephone Encounter (Signed)
Patient notified to go to ER or health department to obtain a PCP for her breathing issues. I told her that Elijah Birk was not going to be able to order a nebulizer machine and nebulizers. She said ok.

## 2013-01-20 NOTE — Telephone Encounter (Signed)
Pt not at home left message to call in am

## 2013-01-20 NOTE — Telephone Encounter (Signed)
Pt very upset. States she has been calling everyday multiple times. And she stated that she is very frustrated and upset because nothing has been done. And she is in a lot of pain. Pt was taking Hydrocodone 5/325mg  and wants to know if more can be called in for her. Still waiting on a follow up appointment.

## 2013-01-20 NOTE — Telephone Encounter (Signed)
Pt is out of town. Still has a cough. She has been taking a family member's albuterol nebulizer every 4 hours and says that it is doing wonders for her. She said that her persistent cough is scaring her. She wonders why we have not ordered her a nebulizer machine and treatments and wants one ordered if at all possible. Pt is taking 1 Claritin D a day. I have advised patient to go to ER for persistent cough since she doesn't have a PCP.   She states that after receiving the blood transfusion last week that she had dark blood in her stools for approx 5-6 days. It is no longer happening. Her GI MD is Dr. Jena Gauss.

## 2013-01-20 NOTE — Telephone Encounter (Signed)
Will do [facility]!!!

## 2013-01-20 NOTE — Telephone Encounter (Signed)
The problem that she is going to have is that we are not her PCP and her breathing issue is a PCP medical problem.  Continue with inhaler and she should ascertain a PCP or report to health department for her medical complaints.  If she has issues with getting an appt with PCP or health department, she should call us and I can help her out the best I can. Ask her to call us Monday with an update please.    She may take OTC Delsym for cough suppression.

## 2013-01-21 ENCOUNTER — Inpatient Hospital Stay (HOSPITAL_COMMUNITY): Payer: Medicaid Other

## 2013-01-21 ENCOUNTER — Emergency Department (HOSPITAL_COMMUNITY): Payer: Medicaid Other

## 2013-01-21 ENCOUNTER — Encounter (HOSPITAL_COMMUNITY): Payer: Self-pay | Admitting: *Deleted

## 2013-01-21 ENCOUNTER — Inpatient Hospital Stay (HOSPITAL_COMMUNITY)
Admission: EM | Admit: 2013-01-21 | Discharge: 2013-01-26 | DRG: 482 | Disposition: A | Discharge status: Home / Self Care | Payer: Medicaid Other | Attending: Orthopaedic Surgery | Admitting: Orthopaedic Surgery

## 2013-01-21 ENCOUNTER — Encounter (HOSPITAL_COMMUNITY): Payer: Self-pay | Admitting: Emergency Medicine

## 2013-01-21 ENCOUNTER — Other Ambulatory Visit: Payer: Self-pay

## 2013-01-21 ENCOUNTER — Inpatient Hospital Stay (HOSPITAL_COMMUNITY): Payer: Medicaid Other | Admitting: *Deleted

## 2013-01-21 ENCOUNTER — Encounter (HOSPITAL_COMMUNITY)
Admission: EM | Disposition: A | Discharge status: Home / Self Care | Payer: Self-pay | Source: Home / Self Care | Attending: Orthopaedic Surgery

## 2013-01-21 DIAGNOSIS — Z87891 Personal history of nicotine dependence: Secondary | ICD-10-CM

## 2013-01-21 DIAGNOSIS — Z9010 Acquired absence of unspecified breast and nipple: Secondary | ICD-10-CM

## 2013-01-21 DIAGNOSIS — G8929 Other chronic pain: Secondary | ICD-10-CM | POA: Diagnosis present

## 2013-01-21 DIAGNOSIS — Y9229 Other specified public building as the place of occurrence of the external cause: Secondary | ICD-10-CM

## 2013-01-21 DIAGNOSIS — Z8711 Personal history of peptic ulcer disease: Secondary | ICD-10-CM

## 2013-01-21 DIAGNOSIS — S7291XA Unspecified fracture of right femur, initial encounter for closed fracture: Secondary | ICD-10-CM

## 2013-01-21 DIAGNOSIS — F411 Generalized anxiety disorder: Secondary | ICD-10-CM | POA: Diagnosis present

## 2013-01-21 DIAGNOSIS — Z87442 Personal history of urinary calculi: Secondary | ICD-10-CM

## 2013-01-21 DIAGNOSIS — X500XXA Overexertion from strenuous movement or load, initial encounter: Secondary | ICD-10-CM | POA: Diagnosis present

## 2013-01-21 DIAGNOSIS — F329 Major depressive disorder, single episode, unspecified: Secondary | ICD-10-CM | POA: Diagnosis present

## 2013-01-21 DIAGNOSIS — S72453A Displaced supracondylar fracture without intracondylar extension of lower end of unspecified femur, initial encounter for closed fracture: Principal | ICD-10-CM | POA: Diagnosis present

## 2013-01-21 DIAGNOSIS — F3289 Other specified depressive episodes: Secondary | ICD-10-CM | POA: Diagnosis present

## 2013-01-21 DIAGNOSIS — K219 Gastro-esophageal reflux disease without esophagitis: Secondary | ICD-10-CM | POA: Diagnosis present

## 2013-01-21 DIAGNOSIS — M129 Arthropathy, unspecified: Secondary | ICD-10-CM | POA: Diagnosis present

## 2013-01-21 DIAGNOSIS — M79609 Pain in unspecified limb: Secondary | ICD-10-CM | POA: Diagnosis present

## 2013-01-21 DIAGNOSIS — C50919 Malignant neoplasm of unspecified site of unspecified female breast: Secondary | ICD-10-CM | POA: Diagnosis present

## 2013-01-21 HISTORY — PX: FEMUR IM NAIL: SHX1597

## 2013-01-21 HISTORY — DX: Headache: R51

## 2013-01-21 HISTORY — DX: Shortness of breath: R06.02

## 2013-01-21 HISTORY — DX: Personal history of other medical treatment: Z92.89

## 2013-01-21 LAB — CBC WITH DIFFERENTIAL/PLATELET
Basophils Absolute: 0 10*3/uL (ref 0.0–0.1)
Eosinophils Absolute: 0 10*3/uL (ref 0.0–0.7)
Eosinophils Relative: 0 % (ref 0–5)
Lymphocytes Relative: 29 % (ref 12–46)
Lymphs Abs: 1.5 10*3/uL (ref 0.7–4.0)
MCH: 29.1 pg (ref 26.0–34.0)
MCV: 91.9 fL (ref 78.0–100.0)
Neutrophils Relative %: 61 % (ref 43–77)
Platelets: 347 10*3/uL (ref 150–400)
RBC: 3.82 MIL/uL — ABNORMAL LOW (ref 3.87–5.11)
RDW: 20.5 % — ABNORMAL HIGH (ref 11.5–15.5)
WBC: 5.2 10*3/uL (ref 4.0–10.5)

## 2013-01-21 LAB — BASIC METABOLIC PANEL
Calcium: 8.9 mg/dL (ref 8.4–10.5)
GFR calc non Af Amer: >90 mL/min (ref >90)
Glucose, Bld: 150 mg/dL — ABNORMAL HIGH (ref 70–99)
Potassium: 3.6 mEq/L (ref 3.5–5.1)
Sodium: 139 mEq/L (ref 135–145)

## 2013-01-21 LAB — URINALYSIS, ROUTINE W REFLEX MICROSCOPIC
Bilirubin Urine: NEGATIVE
Glucose, UA: NEGATIVE mg/dL
Hgb urine dipstick: NEGATIVE
Ketones, ur: NEGATIVE mg/dL
Specific Gravity, Urine: 1.02 (ref 1.005–1.030)
pH: 6 (ref 5.0–8.0)

## 2013-01-21 SURGERY — INSERTION, INTRAMEDULLARY ROD, FEMUR, RETROGRADE
Anesthesia: General | Site: Leg Upper | Laterality: Right | Wound class: Clean

## 2013-01-21 MED ORDER — FENTANYL CITRATE 0.05 MG/ML IJ SOLN
INTRAMUSCULAR | Status: DC | PRN
  Administered 2013-01-21 (×2): 50 ug via INTRAVENOUS

## 2013-01-21 MED ORDER — HYDROMORPHONE HCL PF 2 MG/ML IJ SOLN
INTRAMUSCULAR | Status: AC
  Administered 2013-01-21: 2 mg
  Filled 2013-01-21: qty 1

## 2013-01-21 MED ORDER — ONDANSETRON HCL 4 MG/2ML IJ SOLN: 4.0000 mg | Freq: Three times a day (TID) | INTRAMUSCULAR | Status: DC | PRN

## 2013-01-21 MED ORDER — SODIUM CHLORIDE 0.9 % IV SOLN: INTRAVENOUS | Status: DC

## 2013-01-21 MED ORDER — HYDROMORPHONE HCL PF 2 MG/ML IJ SOLN
2.0000 mg | Freq: Once | INTRAMUSCULAR | Status: AC
  Administered 2013-01-21: 2 mg via INTRAMUSCULAR

## 2013-01-21 MED ORDER — SUCCINYLCHOLINE CHLORIDE 20 MG/ML IJ SOLN
INTRAMUSCULAR | Status: DC | PRN
  Administered 2013-01-21: 100 mg via INTRAVENOUS

## 2013-01-21 MED ORDER — FENTANYL CITRATE 0.05 MG/ML IJ SOLN
100.0000 ug | Freq: Once | INTRAMUSCULAR | Status: AC
  Administered 2013-01-21: 100 ug via INTRAVENOUS

## 2013-01-21 MED ORDER — ALBUTEROL SULFATE HFA 108 (90 BASE) MCG/ACT IN AERS
INHALATION_SPRAY | RESPIRATORY_TRACT | Status: DC | PRN
  Administered 2013-01-21: 3 via RESPIRATORY_TRACT

## 2013-01-21 MED ORDER — ONDANSETRON HCL 4 MG/2ML IJ SOLN
INTRAMUSCULAR | Status: DC | PRN
  Administered 2013-01-21: 4 mg via INTRAVENOUS

## 2013-01-21 MED ORDER — FENTANYL CITRATE 0.05 MG/ML IJ SOLN
INTRAMUSCULAR | Status: AC
  Filled 2013-01-21: qty 2

## 2013-01-21 MED ORDER — MIDAZOLAM HCL 5 MG/5ML IJ SOLN
INTRAMUSCULAR | Status: DC | PRN
  Administered 2013-01-21: 2 mg via INTRAVENOUS

## 2013-01-21 MED ORDER — HYDROMORPHONE HCL PF 2 MG/ML IJ SOLN
2.0000 mg | Freq: Once | INTRAMUSCULAR | Status: DC
  Filled 2013-01-21: qty 1

## 2013-01-21 MED ORDER — ONDANSETRON 8 MG PO TBDP
8.0000 mg | ORAL_TABLET | Freq: Once | ORAL | Status: AC
  Administered 2013-01-21: 8 mg via ORAL
  Filled 2013-01-21: qty 1

## 2013-01-21 MED ORDER — SODIUM CHLORIDE 0.9 % IV SOLN
INTRAVENOUS | Status: DC | PRN
  Administered 2013-01-21 (×2): via INTRAVENOUS

## 2013-01-21 MED ORDER — PROPOFOL 10 MG/ML IV BOLUS
INTRAVENOUS | Status: DC | PRN
  Administered 2013-01-21: 120 mg via INTRAVENOUS

## 2013-01-21 MED ORDER — HYDROMORPHONE HCL PF 1 MG/ML IJ SOLN
0.2500 mg | INTRAMUSCULAR | Status: DC | PRN
  Administered 2013-01-21 – 2013-01-24 (×5): 0.5 mg via INTRAVENOUS
  Filled 2013-01-21: qty 1

## 2013-01-21 MED ORDER — HYDROMORPHONE HCL PF 1 MG/ML IJ SOLN
1.0000 mg | Freq: Once | INTRAMUSCULAR | Status: AC
  Administered 2013-01-21: 1 mg via INTRAVENOUS
  Filled 2013-01-21: qty 1

## 2013-01-21 MED ORDER — 0.9 % SODIUM CHLORIDE (POUR BTL) OPTIME
TOPICAL | Status: DC | PRN
  Administered 2013-01-21: 1000 mL

## 2013-01-21 MED ORDER — GLYCOPYRROLATE 0.2 MG/ML IJ SOLN
INTRAMUSCULAR | Status: DC | PRN
  Administered 2013-01-21: 0.4 mg via INTRAVENOUS

## 2013-01-21 MED ORDER — NEOSTIGMINE METHYLSULFATE 1 MG/ML IJ SOLN
INTRAMUSCULAR | Status: DC | PRN
  Administered 2013-01-21: 3 mg via INTRAVENOUS

## 2013-01-21 MED ORDER — FENTANYL CITRATE 0.05 MG/ML IJ SOLN
100.0000 ug | Freq: Once | INTRAMUSCULAR | Status: AC
  Administered 2013-01-21: 100 ug via INTRAVENOUS
  Filled 2013-01-21: qty 2

## 2013-01-21 MED ORDER — ROCURONIUM BROMIDE 100 MG/10ML IV SOLN
INTRAVENOUS | Status: DC | PRN
  Administered 2013-01-21: 30 mg via INTRAVENOUS

## 2013-01-21 MED ORDER — HYDROMORPHONE HCL PF 1 MG/ML IJ SOLN
1.0000 mg | INTRAMUSCULAR | Status: DC | PRN
  Administered 2013-01-21: 1 mg via INTRAVENOUS
  Filled 2013-01-21: qty 1

## 2013-01-21 MED ORDER — CEFAZOLIN SODIUM-DEXTROSE 2-3 GM-% IV SOLR
INTRAVENOUS | Status: DC | PRN
  Administered 2013-01-21: 2 g via INTRAVENOUS

## 2013-01-21 SURGICAL SUPPLY — 63 items
BANDAGE ELASTIC 4 VELCRO ST LF (GAUZE/BANDAGES/DRESSINGS) ×2 IMPLANT
BANDAGE ELASTIC 6 VELCRO ST LF (GAUZE/BANDAGES/DRESSINGS) ×2 IMPLANT
BANDAGE GAUZE ELAST BULKY 4 IN (GAUZE/BANDAGES/DRESSINGS) ×2 IMPLANT
BIT DRILL CALIBRATED 4.3MMX365 (DRILL) IMPLANT
BIT DRILL CROWE PNT TWST 4.5MM (DRILL) IMPLANT
BLADE SURG 10 STRL SS (BLADE) ×2 IMPLANT
CLEANER TIP ELECTROSURG 2X2 (MISCELLANEOUS) ×2 IMPLANT
CLOTH BEACON ORANGE TIMEOUT ST (SAFETY) ×2 IMPLANT
COVER MAYO STAND STRL (DRAPES) ×2 IMPLANT
COVER SURGICAL LIGHT HANDLE (MISCELLANEOUS) ×2 IMPLANT
CUFF TOURNIQUET SINGLE 34IN LL (TOURNIQUET CUFF) IMPLANT
CUFF TOURNIQUET SINGLE 44IN (TOURNIQUET CUFF) IMPLANT
DRAPE C-ARM 42X72 X-RAY (DRAPES) ×2 IMPLANT
DRAPE PROXIMA HALF (DRAPES) ×4 IMPLANT
DRAPE U-SHAPE 47X51 STRL (DRAPES) ×2 IMPLANT
DRILL CALIBRATED 4.3MMX365 (DRILL) ×2
DRILL CROWE POINT TWIST 4.5MM (DRILL) ×2
DRSG EMULSION OIL 3X3 NADH (GAUZE/BANDAGES/DRESSINGS) ×2 IMPLANT
DURAPREP 26ML APPLICATOR (WOUND CARE) ×2 IMPLANT
ELECT REM PT RETURN 9FT ADLT (ELECTROSURGICAL) ×2
ELECTRODE REM PT RTRN 9FT ADLT (ELECTROSURGICAL) ×1 IMPLANT
GLOVE BIO SURGEON STRL SZ8.5 (GLOVE) ×2 IMPLANT
GLOVE BIOGEL PI IND STRL 8 (GLOVE) ×1 IMPLANT
GLOVE BIOGEL PI IND STRL 8.5 (GLOVE) ×1 IMPLANT
GLOVE BIOGEL PI INDICATOR 8 (GLOVE) ×1
GLOVE BIOGEL PI INDICATOR 8.5 (GLOVE) ×1
GLOVE SS BIOGEL STRL SZ 8 (GLOVE) ×1 IMPLANT
GLOVE SUPERSENSE BIOGEL SZ 8 (GLOVE) ×1
GOWN PREVENTION PLUS XXLARGE (GOWN DISPOSABLE) ×2 IMPLANT
GOWN STRL NON-REIN LRG LVL3 (GOWN DISPOSABLE) ×4 IMPLANT
GUIDEPIN 3.2X17.5 THRD DISP (PIN) ×1 IMPLANT
GUIDEWIRE BEAD TIP (WIRE) ×1 IMPLANT
KIT BASIN OR (CUSTOM PROCEDURE TRAY) ×2 IMPLANT
KIT ROOM TURNOVER OR (KITS) ×2 IMPLANT
MANIFOLD NEPTUNE II (INSTRUMENTS) ×1 IMPLANT
NAIL FEM RETRO 10.5X300 (Nail) ×1 IMPLANT
NDL HYPO 21X1.5 SAFETY (NEEDLE) ×1 IMPLANT
NDL HYPO 25GX1X1/2 BEV (NEEDLE) ×1 IMPLANT
NEEDLE HYPO 21X1.5 SAFETY (NEEDLE) IMPLANT
NEEDLE HYPO 25GX1X1/2 BEV (NEEDLE) IMPLANT
NS IRRIG 1000ML POUR BTL (IV SOLUTION) ×2 IMPLANT
PACK ORTHO EXTREMITY (CUSTOM PROCEDURE TRAY) ×2 IMPLANT
PAD ARMBOARD 7.5X6 YLW CONV (MISCELLANEOUS) ×4 IMPLANT
PENCIL BUTTON HOLSTER BLD 10FT (ELECTRODE) ×2 IMPLANT
SCREW CORT TI DBL LEAD 5X32 (Screw) ×1 IMPLANT
SCREW CORT TI DBL LEAD 5X40 (Screw) ×1 IMPLANT
SCREW CORT TI DBL LEAD 5X50 (Screw) ×1 IMPLANT
SCREW CORT TI DBL LEAD 5X56 (Screw) ×1 IMPLANT
SCREW CORT TI DBL LEAD 5X65 (Screw) ×1 IMPLANT
SPONGE GAUZE 4X4 12PLY (GAUZE/BANDAGES/DRESSINGS) ×2 IMPLANT
SPONGE LAP 18X18 X RAY DECT (DISPOSABLE) ×2 IMPLANT
STAPLER VISISTAT 35W (STAPLE) ×2 IMPLANT
SUCTION FRAZIER TIP 10 FR DISP (SUCTIONS) ×2 IMPLANT
SUT VIC AB 0 CT1 27 (SUTURE) ×2
SUT VIC AB 0 CT1 27XBRD ANBCTR (SUTURE) ×1 IMPLANT
SUT VIC AB 2-0 CT1 27 (SUTURE) ×2
SUT VIC AB 2-0 CT1 TAPERPNT 27 (SUTURE) ×1 IMPLANT
SYR BULB IRRIGATION 50ML (SYRINGE) ×2 IMPLANT
SYR CONTROL 10ML LL (SYRINGE) ×1 IMPLANT
TOWEL OR 17X24 6PK STRL BLUE (TOWEL DISPOSABLE) ×2 IMPLANT
TOWEL OR 17X26 10 PK STRL BLUE (TOWEL DISPOSABLE) ×2 IMPLANT
TUBE CONNECTING 12X1/4 (SUCTIONS) ×2 IMPLANT
WATER STERILE IRR 1000ML POUR (IV SOLUTION) ×2 IMPLANT

## 2013-01-21 NOTE — ED Notes (Signed)
Pt comes via EMS after a fall at a convenient store this afternoon. Pt states she slipped and twisted right knee as she fell to the ground. Pt denies hitting head and denies LOC. EMS reports obvious deformity to knee on arrival. Pt presents with knee immobilized. Pt received 50 mcg of Fentanyl via EMS prior to arrival.

## 2013-01-21 NOTE — ED Notes (Signed)
Pt states pain has improved after last dose of pain meds.

## 2013-01-21 NOTE — Anesthesia Preprocedure Evaluation (Signed)
Anesthesia Evaluation  Patient identified by MRN, date of birth, ID band Patient awake    Reviewed: Allergy & Precautions, H&P , NPO status , Patient's Chart, lab work & pertinent test results  Airway Mallampati: III  Neck ROM: Full    Dental  (+) Chipped, Poor Dentition and Dental Advisory Given   Pulmonary shortness of breath,  + rhonchi   + wheezing      Cardiovascular Rhythm:Regular Rate:Normal     Neuro/Psych    GI/Hepatic GERD-  Medicated and Controlled,  Endo/Other    Renal/GU      Musculoskeletal   Abdominal   Peds  Hematology   Anesthesia Other Findings   Reproductive/Obstetrics                           Anesthesia Physical Anesthesia Plan  ASA: III  Anesthesia Plan: General   Post-op Pain Management:    Induction: Intravenous, Rapid sequence and Cricoid pressure planned  Airway Management Planned: Oral ETT  Additional Equipment:   Intra-op Plan:   Post-operative Plan: Extubation in OR  Informed Consent:   Dental advisory given  Plan Discussed with: Anesthesiologist and Surgeon  Anesthesia Plan Comments:         Anesthesia Quick Evaluation

## 2013-01-21 NOTE — H&P (Signed)
Reason for Consult:   Right supracondylar femur fracture Referring Physician:    [name] is an 52 y.o. female  Who slipped in a store and could not get up. Taken to [facility] but no orthopedist on duty so transferred to [facility].  Denies other injuries.  Also Hx of breast CA and mastectomy last year and currently on chemo.   Past Medical History  Diagnosis Date  . Kidney stones 1989  . GERD (gastroesophageal reflux disease)   . Arthritis   . History of stomach ulcers 2003/2007    Hx of esophageal ulcers and stomach ulcers due to reflux  . Breast cancer 06/08/12    right mastectomy  . Back pain   . Infiltrating ductal carcinoma of breast 07/09/2012    [place]:  chemo  . Chronic leg pain   . Polysubstance abuse   . Depression   . Anxiety     Past Surgical History  Procedure Laterality Date  . Esophagogastroduodenoscopy    . Tubal ligation  1989  . Breast surgery      breast biopsy-right  . Mastectomy  06/08/12    right  . Esophageal dilation    . Portacath placement  07/22/2012    Procedure: INSERTION PORT-A-CATH;  Surgeon: Fabio Bering, MD;  Location: [facility] ORS;  Service: General;  Laterality: Left;  left subclavian  . Esophagogastroduodenoscopy (egd) with esophageal dilation  10/04/2005    Rourk-Extensive geographic ulcerations distal third of the tubular esoophagus consistent with severe ulcerative relux esophagitis, early stricture formation status post dilation as described above. 2.  Multiple gastric ulcers without bleeding stigmata as described above. otherwise normal stomach.  3. large bulbar ulcer without bleeding stigmata. otherwise D1 and D2 appeared normal.  . Colonoscopy, esophagogastroduodenoscopy (egd) and esophageal dilation  08/21/2012    Procedure: COLONOSCOPY, ESOPHAGOGASTRODUODENOSCOPY (EGD) AND ESOPHAGEAL DILATION;  Surgeon: Corbin Ade, MD;  Location: [facility] SUITE;  Service: Endoscopy;  Laterality: N/A;  9:30    Family History  Problem  Relation Age of Onset  . Heart attack Father   . COPD Father   . Alcohol abuse Brother   . Alcohol abuse Brother   . Diabetes Mother     Social History:  reports that she quit smoking about 17 months ago. She quit smokeless tobacco use about 7 years ago. She reports that she drinks about 0.6 ounces of alcohol per week. She reports that she does not use illicit drugs.  Allergies:  Allergies  Allergen Reactions  . Citalopram     Makes her feel drowsy/sleepy.  . Effexor (Venlafaxine Hcl)     Makes pt feel very bad,will not take again.  . Motrin (Ibuprofen) Swelling    Lips swell  . Trazodone And Nefazodone     Makes pt feel very bad and "hung over"    Medications: I have reviewed the patient's current medications.  Results for orders placed during the hospital encounter of 01/21/13 (from the past 48 hour(s))  CBC WITH DIFFERENTIAL     Status: Abnormal   Collection Time    01/21/13  3:43 PM      Result Value Range   WBC 5.2  4.0 - 10.5 K/uL   RBC 3.82 (*) 3.87 - 5.11 MIL/uL   Hemoglobin 11.1 (*) 12.0 - 15.0 g/dL   HCT 16.1 (*) 09.6 - 04.5 %   MCV 91.9  78.0 - 100.0 fL   MCH 29.1  26.0 - 34.0 pg   MCHC 31.6  30.0 - 36.0 g/dL   RDW 40.9 (*) 81.1 - 91.4 %   Platelets 347  150 - 400 K/uL   Neutrophils Relative % 61  43 - 77 %   Neutro Abs 3.2  1.7 - 7.7 K/uL   Lymphocytes Relative 29  12 - 46 %   Lymphs Abs 1.5  0.7 - 4.0 K/uL   Monocytes Relative 10  3 - 12 %   Monocytes Absolute 0.5  0.1 - 1.0 K/uL   Eosinophils Relative 0  0 - 5 %   Eosinophils Absolute 0.0  0.0 - 0.7 K/uL   Basophils Relative 0  0 - 1 %   Basophils Absolute 0.0  0.0 - 0.1 K/uL  BASIC METABOLIC PANEL     Status: Abnormal   Collection Time    01/21/13  3:43 PM      Result Value Range   Sodium 139  135 - 145 mEq/L   Potassium 3.6  3.5 - 5.1 mEq/L   Chloride 103  96 - 112 mEq/L   CO2 25  19 - 32 mEq/L   Glucose, Bld 150 (*) 70 - 99 mg/dL   BUN <3 (*) 6 - 23 mg/dL   Creatinine, Ser 7.82 (*) 0.50 -  1.10 mg/dL   Calcium 8.9  8.4 - 95.6 mg/dL   GFR calc non Af Amer >90  >90 mL/min   GFR calc Af Amer >90  >90 mL/min   Comment:            The eGFR has been calculated     using the CKD EPI equation.     This calculation has not been     validated in all clinical     situations.     eGFR's persistently     <90 mL/min signify     possible Chronic Kidney Disease.  URINALYSIS, ROUTINE W REFLEX MICROSCOPIC     Status: Abnormal   Collection Time    01/21/13  6:24 PM      Result Value Range   Color, Urine AMBER (*) YELLOW   Comment: BIOCHEMICALS MAY BE AFFECTED BY COLOR   APPearance CLOUDY (*) CLEAR   Specific Gravity, Urine 1.020  1.005 - 1.030   pH 6.0  5.0 - 8.0   Glucose, UA NEGATIVE  NEGATIVE mg/dL   Hgb urine dipstick NEGATIVE  NEGATIVE   Bilirubin Urine NEGATIVE  NEGATIVE   Ketones, ur NEGATIVE  NEGATIVE mg/dL   Protein, ur NEGATIVE  NEGATIVE mg/dL   Urobilinogen, UA 0.2  0.0 - 1.0 mg/dL   Nitrite NEGATIVE  NEGATIVE   Leukocytes, UA NEGATIVE  NEGATIVE   Comment: MICROSCOPIC NOT DONE ON URINES WITH NEGATIVE PROTEIN, BLOOD, LEUKOCYTES, NITRITE, OR GLUCOSE <1000 mg/dL.    Dg Chest 1 View  01/21/2013   *RADIOLOGY REPORT*  Clinical Data: Knee injury.  Fall.Breast cancer.  Right mastectomy. Chest pain.  CHEST - 1 VIEW  Comparison: 01/13/2013.  Findings: No pneumothorax.  Cardiopericardial silhouette within normal limits. Mediastinal contours normal. Trachea midline.  No airspace disease or effusion.  Left subclavian power port is present with the tip in the mid SVC.  Right axillary dissection clips are present.  IMPRESSION: No acute cardiopulmonary disease.  Postoperative and postprocedural changes of the chest.   Original Report Authenticated By: Andreas Newport, M.D.   Dg Knee 1-2 Views Right  01/21/2013   *RADIOLOGY REPORT*  Clinical Data: Pain post trauma  RIGHT KNEE - 1-2 VIEW  Comparison: None.  Findings: Frontal  and lateral views were obtained.  There is a comminuted fracture of  the distal femoral metaphysis with posterior displacement of the distal major fracture fragments with respect proximal fragment.  There is approximately 4 cm of overriding.  The femoral shaft is located immediately superior to the patella.  There is no gross dislocation.  IMPRESSION: Comminuted displaced fracture of the distal femoral metaphysis as described.   Original Report Authenticated By: Bretta Bang, M.D.    @ROS @ Blood pressure 149/100, pulse 90, temperature 97.8 F (36.6 C), temperature source Oral, resp. rate 18, SpO2 99.00%.  PHYSICAL EXAM:   Neurologically intact ABD soft Sensation intact distally Intact pulses distally Dorsiflexion/Plantar flexion intact Compartment soft  Right leg deformed and tender to motion  Other leg and both arms painfree  ASSESSMENT:   Right supracondylar femur fracture  PLAN:   Needs ORIF hopefully with retrograde nail but may need DCS.  Reviewed severity of fracture and increased risk for complications (DVT, infection, death) due to breast cancer but no good alternative to operating.   Elizabeth Orr G 01/21/2013, 7:15 PM

## 2013-01-21 NOTE — ED Notes (Signed)
Report given to [facility], pt aware.

## 2013-01-21 NOTE — Transfer of Care (Signed)
Immediate Anesthesia Transfer of Care Note  Patient: Elizabeth Orr  Procedure(s) Performed: Procedure(s): INTRAMEDULLARY (IM) RETROGRADE FEMORAL NAILING (Right)  Patient Location: PACU  Anesthesia Type:General  Level of Consciousness: awake, alert  and oriented  Airway & Oxygen Therapy: Patient Spontanous Breathing and Patient connected to face mask  Post-op Assessment: Report given to PACU RN, Post -op Vital signs reviewed and stable and Patient moving all extremities X 4  Post vital signs: Reviewed and stable  Complications: No apparent anesthesia complications

## 2013-01-21 NOTE — Interval H&P Note (Signed)
History and Physical Interval Note:  01/21/2013 8:03 PM  Elizabeth Orr  has presented today for surgery, with the diagnosis of Right Femur Fracture  The various methods of treatment have been discussed with the patient and family. After consideration of risks, benefits and other options for treatment, the patient has consented to  Procedure(s): INTRAMEDULLARY (IM) RETROGRADE FEMORAL NAILING (Right) as a surgical intervention .  The patient's history has been reviewed, patient examined, no change in status, stable for surgery.  I have reviewed the patient's chart and labs.  Questions were answered to the patient's satisfaction.     [name]

## 2013-01-21 NOTE — ED Provider Notes (Signed)
History  This chart was scribed for Flint Melter, MD by Bennett Scrape, ED Scribe. This patient was seen in room APA17/APA17 and the patient's care was started at 2:00 PM.  CSN: [id number]  Arrival date & time 01/21/13  1353   None     Chief Complaint  Patient presents with  . Knee Injury     Patient is a 52 y.o. female presenting with knee pain. The history is provided by the patient. No language interpreter was used.  Knee Pain Location:  Knee Time since incident:  30 minutes Injury: yes   Mechanism of injury: fall   Fall:    Fall occurred:  Walking   Impact surface:  Hard floor   Point of impact:  Knees Knee location:  R knee Associated symptoms: no back pain     HPI Comments: Elizabeth Orr is a 52 y.o. female brought in by ambulance, who presents to the Emergency Department complaining of sudden onset, non-changing, constant right knee pain after a slip and fall in a convenient store. Pt states that she slipped and twisted the right knee as she fell to the ground. She denies head trauma or LOC. EMS reports an obvious deformity and has the RLE fully immobilized. She received 50 mcg en route. She denies any other injuries currently.  Past Medical History  Diagnosis Date  . Kidney stones 1989  . GERD (gastroesophageal reflux disease)   . Arthritis   . History of stomach ulcers 2003/2007    Hx of esophageal ulcers and stomach ulcers due to reflux  . Breast cancer 06/08/12    right mastectomy  . Back pain   . Infiltrating ductal carcinoma of breast 07/09/2012    Neijstrom:  chemo  . Chronic leg pain   . Polysubstance abuse   . Depression   . Anxiety     Past Surgical History  Procedure Laterality Date  . Esophagogastroduodenoscopy    . Tubal ligation  1989  . Breast surgery      breast biopsy-right  . Mastectomy  06/08/12    right  . Esophageal dilation    . Portacath placement  07/22/2012    Procedure: INSERTION PORT-A-CATH;  Surgeon: Fabio Bering,  MD;  Location: [facility];  Service: General;  Laterality: Left;  left subclavian  . Esophagogastroduodenoscopy (egd) with esophageal dilation  10/04/2005    Rourk-Extensive geographic ulcerations distal third of the tubular esoophagus consistent with severe ulcerative relux esophagitis, early stricture formation status post dilation as described above. 2.  Multiple gastric ulcers without bleeding stigmata as described above. otherwise normal stomach.  3. large bulbar ulcer without bleeding stigmata. otherwise D1 and D2 appeared normal.  . Colonoscopy, esophagogastroduodenoscopy (egd) and esophageal dilation  08/21/2012    Procedure: COLONOSCOPY, ESOPHAGOGASTRODUODENOSCOPY (EGD) AND ESOPHAGEAL DILATION;  Surgeon: Corbin Ade, MD;  Location: [facility];  Service: Endoscopy;  Laterality: N/A;  9:30    Family History  Problem Relation Age of Onset  . Heart attack Father   . COPD Father   . Alcohol abuse Brother   . Alcohol abuse Brother   . Diabetes Mother     History  Substance Use Topics  . Smoking status: Former Smoker -- 0.25 packs/day for 26 years    Quit date: 08/07/2011  . Smokeless tobacco: Former Neurosurgeon    Quit date: 08/18/2005  . Alcohol Use: 0.6 oz/week    1 Cans of beer per week     Comment: Can of beer  or 2 daily to flush kidneys per MD, stop drinking 1 mo ago    OB History   Grav Para Term Preterm Abortions TAB SAB Ect Mult Living   6 6        6       Review of Systems  Cardiovascular: Negative for chest pain.  Gastrointestinal: Negative for abdominal pain.  Musculoskeletal: Positive for arthralgias (right leg ). Negative for back pain.  Skin: Negative for wound.  All other systems reviewed and are negative.    Allergies  Citalopram; Effexor; Motrin; and Trazodone and nefazodone  Home Medications   Current Outpatient Rx  Name  Route  Sig  Dispense  Refill  . albuterol (PROVENTIL HFA;VENTOLIN HFA) 108 (90 BASE) MCG/ACT inhaler   Inhalation   Inhale 2 puffs  into the lungs every 6 (six) hours as needed for wheezing.   1 Inhaler   2   . Alum & Mag Hydroxide-Simeth (MAGIC MOUTHWASH) SOLN   Oral   Take 5 mLs by mouth 4 (four) times daily as needed.          . chlorpheniramine-HYDROcodone (TUSSIONEX) 10-8 MG/5ML LQCR   Oral   Take 5 mLs by mouth at bedtime as needed. 60ml no refills         . dexamethasone (DECADRON) 4 MG tablet      Take 2 tablets @ 9pm PM before chemo then 2 tablets @ 3am the AM of chemo. Then take 2 tabs in AM and 2 tabs in PM for 1 day after chemo.   30 tablet   0   . HYDROcodone-acetaminophen (NORCO/VICODIN) 5-325 MG per tablet   Oral   Take 1 tablet by mouth every 6 (six) hours as needed for pain.         . hydrOXYzine (VISTARIL) 50 MG capsule   Oral   Take 50 mg by mouth at bedtime as needed for itching. Take two tablets at bedtime as needed.         . iron polysaccharides (NIFEREX) 150 MG capsule   Oral   Take 150 mg by mouth daily.         Marland Kitchen omeprazole (PRILOSEC) 20 MG capsule   Oral   Take 1 capsule (20 mg total) by mouth daily.   30 capsule   2   . potassium chloride SA (K-DUR,KLOR-CON) 20 MEQ tablet   Oral   Take 1 tablet (20 mEq total) by mouth daily.   30 tablet   3   . sucralfate (CARAFATE) 1 GM/10ML suspension   Oral   Take 1 g by mouth 4 (four) times daily.         Marland Kitchen azithromycin (ZITHROMAX) 250 MG tablet      Take as directed         . lidocaine-prilocaine (EMLA) cream      Apply a quarter sized amount to port site 1 hour prior to chemo. Do not rub in.  Cover with plastic.         Marland Kitchen LORazepam (ATIVAN) 1 MG tablet   Oral   Take 1 mg by mouth every 4 (four) hours as needed. Take 1 tablet every 4 hours IF needed for nausea/vomiting.         . magnesium hydroxide (MILK OF MAGNESIA) 400 MG/5ML suspension   Oral   Take 30 mLs by mouth daily as needed for constipation.         . ondansetron (ZOFRAN) 8 MG tablet   Oral  Take 8 mg by mouth. Starting the day after  chemo, take 1 tablet in the am and 1 tablet in the pm for 2 days. Then may take 1 tablet two times a day IF needed for nausea/vomiting.         . pegfilgrastim (NEULASTA) 6 MG/0.6ML injection   Subcutaneous   Inject 6 mg into the skin every 14 (fourteen) days.           Triage Vitals: BP 132/105  Pulse 105  Temp(Src) 98.2 F (36.8 C) (Oral)  Resp 20  SpO2 98%  Physical Exam  Nursing note and vitals reviewed. Constitutional: She is oriented to person, place, and time. She appears well-developed and well-nourished.  HENT:  Head: Normocephalic and atraumatic.  Eyes: Conjunctivae and EOM are normal. Pupils are equal, round, and reactive to light.  Neck: Normal range of motion and phonation normal. Neck supple.  Cardiovascular: Normal rate, regular rhythm and intact distal pulses.   Pulmonary/Chest: Effort normal and breath sounds normal. She exhibits no tenderness.  Abdominal: Soft. She exhibits no distension. There is no tenderness. There is no guarding.  Musculoskeletal:  Right leg is shortened and externally rotated, swelling around the right knee, tenderness distally to knee  Neurological: She is alert and oriented to person, place, and time. She has normal strength. She exhibits normal muscle tone.  Skin: Skin is warm and dry.  Psychiatric: She has a normal mood and affect. Her behavior is normal. Judgment and thought content normal.    ED Course  Procedures (including critical care time)  Medications  0.9 %  sodium chloride infusion (not administered)  HYDROmorphone (DILAUDID) injection 2 mg (2 mg Intramuscular Given 01/21/13 1401)  ondansetron (ZOFRAN-ODT) disintegrating tablet 8 mg (8 mg Oral Given 01/21/13 1405)  fentaNYL (SUBLIMAZE) injection 100 mcg (100 mcg Intravenous Given 01/21/13 1425)  fentaNYL (SUBLIMAZE) injection 100 mcg (100 mcg Intravenous Given 01/21/13 1514)  HYDROmorphone (DILAUDID) injection 1 mg (1 mg Intravenous Given 01/21/13 1607)    Patient Vitals for  the past 24 hrs:  BP Temp Temp src Pulse Resp SpO2  01/21/13 1607 142/89 mmHg - - 95 20 96 %  01/21/13 1403 132/105 mmHg 98.2 F (36.8 C) Oral 105 20 98 %     Date: 01/21/13  Rate: 97  Rhythm: normal sinus rhythm  QRS Axis: normal  PR and QT Intervals: QT prolonged  ST/T Wave abnormalities: nonspecific ST changes  PR and QRS Conduction Disutrbances:none  Narrative Interpretation:   Old EKG Reviewed: none available    Medications  0.9 %  sodium chloride infusion (not administered)  HYDROmorphone (DILAUDID) injection 2 mg (2 mg Intramuscular Given 01/21/13 1401)  ondansetron (ZOFRAN-ODT) disintegrating tablet 8 mg (8 mg Oral Given 01/21/13 1405)  fentaNYL (SUBLIMAZE) injection 100 mcg (100 mcg Intravenous Given 01/21/13 1425)  fentaNYL (SUBLIMAZE) injection 100 mcg (100 mcg Intravenous Given 01/21/13 1514)  HYDROmorphone (DILAUDID) injection 1 mg (1 mg Intravenous Given 01/21/13 1607)    DIAGNOSTIC STUDIES: Oxygen Saturation is 98% on room air, normal by my interpretation.    COORDINATION OF CARE: 2:00 PM-Discussed treatment plan which includes xrays of the right knee and medications with pt at bedside and pt agreed to plan.   3:55 PM-Consult complete with Dr. Hilda Lias, ortho. Patient case explained and discussed. Dr. Hilda Lias advises to transport pt to [facility]. Call ended at 3:56 PM.   4:10 PM-Consult complete with Dr. Jerl Santos [facility]). Patient case explained and discussed. He agrees to admit patient for further evaluation and  treatment. Call ended at 16:28  Labs Reviewed  CBC WITH DIFFERENTIAL - Abnormal; Notable for the following:    RBC 3.82 (*)    Hemoglobin 11.1 (*)    HCT 35.1 (*)    RDW 20.5 (*)    All other components within normal limits  BASIC METABOLIC PANEL - Abnormal; Notable for the following:    Glucose, Bld 150 (*)    BUN <3 (*)    Creatinine, Ser 0.49 (*)    All other components within normal limits  URINE CULTURE  URINALYSIS, ROUTINE W REFLEX MICROSCOPIC     Dg Chest 1 View  01/21/2013   *RADIOLOGY REPORT*  Clinical Data: Knee injury.  Fall.Breast cancer.  Right mastectomy. Chest pain.  CHEST - 1 VIEW  Comparison: 01/13/2013.  Findings: No pneumothorax.  Cardiopericardial silhouette within normal limits. Mediastinal contours normal. Trachea midline.  No airspace disease or effusion.  Left subclavian power port is present with the tip in the mid SVC.  Right axillary dissection clips are present.  IMPRESSION: No acute cardiopulmonary disease.  Postoperative and postprocedural changes of the chest.   Original Report Authenticated By: Andreas Newport, M.D.   Dg Knee 1-2 Views Right  01/21/2013   *RADIOLOGY REPORT*  Clinical Data: Pain post trauma  RIGHT KNEE - 1-2 VIEW  Comparison: None.  Findings: Frontal and lateral views were obtained.  There is a comminuted fracture of the distal femoral metaphysis with posterior displacement of the distal major fracture fragments with respect proximal fragment.  There is approximately 4 cm of overriding.  The femoral shaft is located immediately superior to the patella.  There is no gross dislocation.  IMPRESSION: Comminuted displaced fracture of the distal femoral metaphysis as described.   Original Report Authenticated By: Bretta Bang, M.D.     1. Femur fracture, right, closed, initial encounter       MDM  Fall without clear cause. Complicated distal femur fracture, is apparently an isolated injury. No clear source for causes of fall, on screening evaluation. She is stable for transfer to [facility] to an orthopedist that can manage her complicated femur fracture.   Plan: Transfer for admission  I personally performed the services described in this documentation, which was scribed in my presence. The recorded information has been reviewed and is accurate.          Flint Melter, MD 01/21/13 [phone]

## 2013-01-21 NOTE — Anesthesia Procedure Notes (Signed)
Procedure Name: Intubation Date/Time: 01/21/2013 9:27 PM Performed by: Molli Hazard Pre-anesthesia Checklist: Patient identified, Emergency Drugs available, Suction available and Patient being monitored Patient Re-evaluated:Patient Re-evaluated prior to inductionOxygen Delivery Method: Circle system utilized Preoxygenation: Pre-oxygenation with 100% oxygen Intubation Type: IV induction Grade View: Grade I Tube type: Oral Tube size: 7.5 mm Number of attempts: 1 Airway Equipment and Method: Stylet and Video-laryngoscopy Placement Confirmation: ETT inserted through vocal cords under direct vision,  positive ETCO2 and breath sounds checked- equal and bilateral Secured at: 23 cm Tube secured with: Tape Dental Injury: Teeth and Oropharynx as per pre-operative assessment  Comments: Right side (at juncture of upper and lower lip) bleeding before DL.

## 2013-01-21 NOTE — Brief Op Note (Signed)
Elizabeth Orr [id number] 01/21/2013   PRE-OP DIAGNOSIS: right [place] femur fracture  POST-OP DIAGNOSIS: same  PROCEDURE: right [place] femur ORIF  ANESTHESIA: GA  [name]   Dictation #:  [id number]

## 2013-01-22 ENCOUNTER — Encounter (HOSPITAL_COMMUNITY): Payer: Self-pay | Admitting: Orthopaedic Surgery

## 2013-01-22 DIAGNOSIS — S72453A Displaced supracondylar fracture without intracondylar extension of lower end of unspecified femur, initial encounter for closed fracture: Secondary | ICD-10-CM

## 2013-01-22 DIAGNOSIS — W19XXXA Unspecified fall, initial encounter: Secondary | ICD-10-CM

## 2013-01-22 LAB — BASIC METABOLIC PANEL
BUN: 3 mg/dL — ABNORMAL LOW (ref 6–23)
CO2: 22 mEq/L (ref 19–32)
Calcium: 8.4 mg/dL (ref 8.4–10.5)
Creatinine, Ser: 0.46 mg/dL — ABNORMAL LOW (ref 0.50–1.10)

## 2013-01-22 LAB — PROTIME-INR
INR: 1.07 (ref 0.00–1.49)
Prothrombin Time: 13.8 seconds (ref 11.6–15.2)

## 2013-01-22 LAB — URINE CULTURE

## 2013-01-22 LAB — CBC
MCH: 28.5 pg (ref 26.0–34.0)
MCV: 93.7 fL (ref 78.0–100.0)
Platelets: 267 10*3/uL (ref 150–400)
RBC: 3.33 MIL/uL — ABNORMAL LOW (ref 3.87–5.11)
RDW: 21.2 % — ABNORMAL HIGH (ref 11.5–15.5)

## 2013-01-22 MED ORDER — POLYSACCHARIDE IRON COMPLEX 150 MG PO CAPS
150.0000 mg | ORAL_CAPSULE | Freq: Every day | ORAL | Status: DC
  Administered 2013-01-22 – 2013-01-26 (×5): 150 mg via ORAL
  Filled 2013-01-22 (×5): qty 1

## 2013-01-22 MED ORDER — POTASSIUM CHLORIDE CRYS ER 20 MEQ PO TBCR
20.0000 meq | EXTENDED_RELEASE_TABLET | Freq: Every day | ORAL | Status: DC
  Administered 2013-01-22 – 2013-01-26 (×5): 20 meq via ORAL
  Filled 2013-01-22 (×6): qty 1

## 2013-01-22 MED ORDER — ACETAMINOPHEN 650 MG RE SUPP: 650.0000 mg | Freq: Four times a day (QID) | RECTAL | Status: DC | PRN

## 2013-01-22 MED ORDER — HYDROCOD POLST-CHLORPHEN POLST 10-8 MG/5ML PO LQCR: 5.0000 mL | Freq: Every evening | ORAL | Status: DC | PRN

## 2013-01-22 MED ORDER — WARFARIN SODIUM 7.5 MG PO TABS
7.5000 mg | ORAL_TABLET | Freq: Once | ORAL | Status: AC
  Administered 2013-01-22: 7.5 mg via ORAL
  Filled 2013-01-22 (×2): qty 1

## 2013-01-22 MED ORDER — ONDANSETRON HCL 4 MG PO TABS: 4.0000 mg | ORAL_TABLET | Freq: Four times a day (QID) | ORAL | Status: DC | PRN

## 2013-01-22 MED ORDER — HYDROMORPHONE BOLUS VIA INFUSION
1.0000 mg | Freq: Once | INTRAVENOUS | Status: AC
  Administered 2013-01-22: 1 mg via INTRAVENOUS

## 2013-01-22 MED ORDER — PANTOPRAZOLE SODIUM 40 MG PO TBEC
40.0000 mg | DELAYED_RELEASE_TABLET | Freq: Every day | ORAL | Status: DC
  Administered 2013-01-22 – 2013-01-26 (×5): 40 mg via ORAL
  Filled 2013-01-22 (×5): qty 1

## 2013-01-22 MED ORDER — MAGIC MOUTHWASH
5.0000 mL | Freq: Four times a day (QID) | ORAL | Status: DC | PRN
  Filled 2013-01-22: qty 5

## 2013-01-22 MED ORDER — COUMADIN BOOK
Freq: Once | Status: AC
  Administered 2013-01-23: 18:00:00
  Filled 2013-01-22: qty 1

## 2013-01-22 MED ORDER — AZITHROMYCIN 250 MG PO TABS
250.0000 mg | ORAL_TABLET | Freq: Once | ORAL | Status: AC
  Administered 2013-01-22: 250 mg via ORAL
  Filled 2013-01-22: qty 1

## 2013-01-22 MED ORDER — FILGRASTIM 300 MCG/ML IJ SOLN
300.0000 ug | Freq: Every day | INTRAMUSCULAR | Status: DC
  Administered 2013-01-22 – 2013-01-24 (×2): 300 ug via SUBCUTANEOUS
  Filled 2013-01-22 (×5): qty 1

## 2013-01-22 MED ORDER — ENOXAPARIN SODIUM 40 MG/0.4ML ~~LOC~~ SOLN
40.0000 mg | SUBCUTANEOUS | Status: DC
  Filled 2013-01-22 (×2): qty 0.4

## 2013-01-22 MED ORDER — MORPHINE SULFATE 2 MG/ML IJ SOLN
1.0000 mg | INTRAMUSCULAR | Status: DC | PRN
  Administered 2013-01-22 (×2): 2 mg via INTRAVENOUS
  Administered 2013-01-22 (×2): 1 mg via INTRAVENOUS
  Administered 2013-01-22 – 2013-01-26 (×21): 2 mg via INTRAVENOUS
  Filled 2013-01-22 (×25): qty 1

## 2013-01-22 MED ORDER — MENTHOL 3 MG MT LOZG: 1.0000 | LOZENGE | OROMUCOSAL | Status: DC | PRN

## 2013-01-22 MED ORDER — WARFARIN - PHARMACIST DOSING INPATIENT: Freq: Every day | Status: DC

## 2013-01-22 MED ORDER — SUCRALFATE 1 GM/10ML PO SUSP
1.0000 g | Freq: Four times a day (QID) | ORAL | Status: DC
  Administered 2013-01-22 – 2013-01-26 (×6): 1 g via ORAL
  Filled 2013-01-22 (×20): qty 10

## 2013-01-22 MED ORDER — FLEET ENEMA 7-19 GM/118ML RE ENEM: 1.0000 | ENEMA | Freq: Once | RECTAL | Status: AC | PRN

## 2013-01-22 MED ORDER — SENNOSIDES-DOCUSATE SODIUM 8.6-50 MG PO TABS: 1.0000 | ORAL_TABLET | Freq: Every evening | ORAL | Status: DC | PRN

## 2013-01-22 MED ORDER — METOCLOPRAMIDE HCL 10 MG PO TABS: 5.0000 mg | ORAL_TABLET | Freq: Three times a day (TID) | ORAL | Status: DC | PRN

## 2013-01-22 MED ORDER — OXYCODONE-ACETAMINOPHEN 5-325 MG PO TABS
1.0000 | ORAL_TABLET | ORAL | Status: DC | PRN
  Administered 2013-01-22 – 2013-01-23 (×10): 2 via ORAL
  Administered 2013-01-23: 1 via ORAL
  Administered 2013-01-24 (×2): 2 via ORAL
  Administered 2013-01-24: 1 via ORAL
  Administered 2013-01-24 – 2013-01-26 (×11): 2 via ORAL
  Filled 2013-01-22 (×26): qty 2

## 2013-01-22 MED ORDER — METOCLOPRAMIDE HCL 5 MG/ML IJ SOLN: 5.0000 mg | Freq: Three times a day (TID) | INTRAMUSCULAR | Status: DC | PRN

## 2013-01-22 MED ORDER — WARFARIN VIDEO
Freq: Once | Status: AC
  Administered 2013-01-23: 18:00:00

## 2013-01-22 MED ORDER — ENOXAPARIN SODIUM 40 MG/0.4ML ~~LOC~~ SOLN
40.0000 mg | SUBCUTANEOUS | Status: DC
  Administered 2013-01-22 – 2013-01-24 (×3): 40 mg via SUBCUTANEOUS
  Filled 2013-01-22 (×4): qty 0.4

## 2013-01-22 MED ORDER — ALBUTEROL SULFATE HFA 108 (90 BASE) MCG/ACT IN AERS
2.0000 | INHALATION_SPRAY | Freq: Four times a day (QID) | RESPIRATORY_TRACT | Status: DC | PRN
  Administered 2013-01-22: 2 via RESPIRATORY_TRACT
  Filled 2013-01-22 (×2): qty 6.7

## 2013-01-22 MED ORDER — HYDROCODONE-ACETAMINOPHEN 5-325 MG PO TABS
1.0000 | ORAL_TABLET | Freq: Four times a day (QID) | ORAL | Status: DC | PRN
  Administered 2013-01-23 – 2013-01-24 (×2): 2 via ORAL
  Filled 2013-01-22 (×2): qty 2

## 2013-01-22 MED ORDER — HYDROXYZINE PAMOATE 50 MG PO CAPS
50.0000 mg | ORAL_CAPSULE | Freq: Every evening | ORAL | Status: DC | PRN
  Filled 2013-01-22: qty 1

## 2013-01-22 MED ORDER — DEXTROSE IN LACTATED RINGERS 5 % IV SOLN
INTRAVENOUS | Status: DC
  Administered 2013-01-22: 02:00:00 via INTRAVENOUS

## 2013-01-22 MED ORDER — DOCUSATE SODIUM 100 MG PO CAPS
100.0000 mg | ORAL_CAPSULE | Freq: Two times a day (BID) | ORAL | Status: DC
  Administered 2013-01-22 – 2013-01-26 (×10): 100 mg via ORAL
  Filled 2013-01-22 (×11): qty 1

## 2013-01-22 MED ORDER — METHOCARBAMOL 100 MG/ML IJ SOLN
500.0000 mg | Freq: Four times a day (QID) | INTRAVENOUS | Status: DC | PRN
  Administered 2013-01-22: 500 mg via INTRAVENOUS
  Filled 2013-01-22: qty 5

## 2013-01-22 MED ORDER — ZOLPIDEM TARTRATE 5 MG PO TABS
5.0000 mg | ORAL_TABLET | Freq: Every evening | ORAL | Status: DC | PRN
  Administered 2013-01-22 – 2013-01-25 (×3): 5 mg via ORAL
  Filled 2013-01-22 (×3): qty 1

## 2013-01-22 MED ORDER — MORPHINE SULFATE 2 MG/ML IJ SOLN
0.5000 mg | INTRAMUSCULAR | Status: DC | PRN
  Administered 2013-01-22 (×4): 0.5 mg via INTRAVENOUS
  Filled 2013-01-22 (×4): qty 1

## 2013-01-22 MED ORDER — ONDANSETRON HCL 4 MG/2ML IJ SOLN: 4.0000 mg | Freq: Four times a day (QID) | INTRAMUSCULAR | Status: DC | PRN

## 2013-01-22 MED ORDER — ACETAMINOPHEN 325 MG PO TABS: 650.0000 mg | ORAL_TABLET | Freq: Four times a day (QID) | ORAL | Status: DC | PRN

## 2013-01-22 MED ORDER — LORAZEPAM 1 MG PO TABS
1.0000 mg | ORAL_TABLET | ORAL | Status: DC | PRN
  Administered 2013-01-22 – 2013-01-26 (×9): 1 mg via ORAL
  Filled 2013-01-22 (×9): qty 1

## 2013-01-22 MED ORDER — CEFAZOLIN SODIUM-DEXTROSE 2-3 GM-% IV SOLR
2.0000 g | Freq: Four times a day (QID) | INTRAVENOUS | Status: AC
  Administered 2013-01-22 (×2): 2 g via INTRAVENOUS
  Filled 2013-01-22 (×2): qty 50

## 2013-01-22 MED ORDER — PHENOL 1.4 % MT LIQD: 1.0000 | OROMUCOSAL | Status: DC | PRN

## 2013-01-22 MED ORDER — ALUM & MAG HYDROXIDE-SIMETH 200-200-20 MG/5ML PO SUSP
30.0000 mL | ORAL | Status: DC | PRN
  Administered 2013-01-22 – 2013-01-25 (×3): 30 mL via ORAL
  Filled 2013-01-22 (×3): qty 30

## 2013-01-22 MED ORDER — BISACODYL 5 MG PO TBEC
5.0000 mg | DELAYED_RELEASE_TABLET | Freq: Every day | ORAL | Status: DC | PRN
  Administered 2013-01-25: 5 mg via ORAL
  Filled 2013-01-22: qty 1

## 2013-01-22 MED ORDER — FERROUS SULFATE 325 (65 FE) MG PO TABS
325.0000 mg | ORAL_TABLET | Freq: Three times a day (TID) | ORAL | Status: DC
  Administered 2013-01-22 – 2013-01-26 (×13): 325 mg via ORAL
  Filled 2013-01-22 (×16): qty 1

## 2013-01-22 MED ORDER — MAGNESIUM HYDROXIDE 400 MG/5ML PO SUSP: 30.0000 mL | Freq: Every day | ORAL | Status: DC | PRN

## 2013-01-22 MED ORDER — METHOCARBAMOL 500 MG PO TABS
500.0000 mg | ORAL_TABLET | Freq: Four times a day (QID) | ORAL | Status: DC | PRN
  Administered 2013-01-22 – 2013-01-26 (×12): 500 mg via ORAL
  Filled 2013-01-22 (×12): qty 1

## 2013-01-22 MED ORDER — ONDANSETRON HCL 4 MG PO TABS: 8.0000 mg | ORAL_TABLET | Freq: Three times a day (TID) | ORAL | Status: DC | PRN

## 2013-01-22 NOTE — Telephone Encounter (Signed)
Called pt back, she is in [facility] hospital with a "broke leg" per pt she feel yesterday at a store, I informed her she has an appt 6/12 at 3:30 for her endo biopsy.

## 2013-01-22 NOTE — Progress Notes (Signed)
ANTICOAGULATION CONSULT NOTE - Initial Consult  Pharmacy Consult for Coumadin Indication: VTE prophylaxis  Allergies  Allergen Reactions  . Citalopram     Makes her feel drowsy/sleepy.  . Effexor (Venlafaxine Hcl)     Makes pt feel very bad,will not take again.  . Motrin (Ibuprofen) Swelling    Lips swell  . Trazodone And Nefazodone     Makes pt feel very bad and "hung over"    Patient Measurements: Height: 4' 11.84" (152 cm) (taken from 12/08/12 report) Weight: 203 lb 0.7 oz (92.1 kg) (from 01/13/13) IBW/kg (Calculated) : 45.14   Vital Signs: Temp: 98.5 F (36.9 C) (06/06 1300) BP: 140/81 mmHg (06/06 1300) Pulse Rate: 92 (06/06 1300)  Labs:  Recent Labs  01/21/13 1543 01/22/13 0640 01/22/13 1250  HGB 11.1* 9.5*  --   HCT 35.1* 31.2*  --   PLT 347 267  --   LABPROT  --   --  13.8  INR  --   --  1.07  CREATININE 0.49* 0.46*  --     Estimated Creatinine Clearance: 84.9 ml/min (by C-G formula based on Cr of 0.46).   Medical History: Past Medical History  Diagnosis Date  . Kidney stones 1989  . GERD (gastroesophageal reflux disease)   . Arthritis   . History of stomach ulcers 2003/2007    Hx of esophageal ulcers and stomach ulcers due to reflux  . Back pain   . Chronic leg pain   . Polysubstance abuse   . Depression   . Anxiety   . Exertional shortness of breath   . History of blood transfusion     "recently had my 2nd; related to chemo" (01/21/2013)  . ZOXWRUEA(540.9)     "monthly" (01/21/2013)  . Breast cancer 06/08/12    right mastectomy  . Infiltrating ductal carcinoma of breast 07/09/2012    Neijstrom:  chemo    Medications:  Prescriptions prior to admission  Medication Sig Dispense Refill  . albuterol (PROVENTIL HFA;VENTOLIN HFA) 108 (90 BASE) MCG/ACT inhaler Inhale 2 puffs into the lungs every 6 (six) hours as needed for wheezing.  1 Inhaler  2  . Alum & Mag Hydroxide-Simeth (MAGIC MOUTHWASH) SOLN Take 5 mLs by mouth 4 (four) times daily as  needed.       . chlorpheniramine-HYDROcodone (TUSSIONEX) 10-8 MG/5ML LQCR Take 5 mLs by mouth at bedtime as needed. 60ml no refills      . dexamethasone (DECADRON) 4 MG tablet Take 2 tablets @ 9pm PM before chemo then 2 tablets @ 3am the AM of chemo. Then take 2 tabs in AM and 2 tabs in PM for 1 day after chemo.  30 tablet  0  . hydrOXYzine (VISTARIL) 50 MG capsule Take 50 mg by mouth at bedtime as needed for itching. Take two tablets at bedtime as needed.      . iron polysaccharides (NIFEREX) 150 MG capsule Take 150 mg by mouth daily.      Marland Kitchen lidocaine-prilocaine (EMLA) cream Apply a quarter sized amount to port site 1 hour prior to chemo. Do not rub in.  Cover with plastic.      Marland Kitchen LORazepam (ATIVAN) 1 MG tablet Take 1 mg by mouth every 4 (four) hours as needed. Take 1 tablet every 4 hours IF needed for nausea/vomiting.      . magnesium hydroxide (MILK OF MAGNESIA) 400 MG/5ML suspension Take 30 mLs by mouth daily as needed for constipation.      Marland Kitchen omeprazole (PRILOSEC) 20  MG capsule Take 1 capsule (20 mg total) by mouth daily.  30 capsule  2  . ondansetron (ZOFRAN) 8 MG tablet Take 8 mg by mouth. Starting the day after chemo, take 1 tablet in the am and 1 tablet in the pm for 2 days. Then may take 1 tablet two times a day IF needed for nausea/vomiting.      . pegfilgrastim (NEULASTA) 6 MG/0.6ML injection Inject 6 mg into the skin every 14 (fourteen) days.      . potassium chloride SA (K-DUR,KLOR-CON) 20 MEQ tablet Take 1 tablet (20 mEq total) by mouth daily.  30 tablet  3  . sucralfate (CARAFATE) 1 GM/10ML suspension Take 1 g by mouth 4 (four) times daily.      . [DISCONTINUED] HYDROcodone-acetaminophen (NORCO/VICODIN) 5-325 MG per tablet Take 1 tablet by mouth every 6 (six) hours as needed for pain.       Scheduled:  . coumadin book   Does not apply Once  . docusate sodium  100 mg Oral BID  . enoxaparin (LOVENOX) injection  40 mg Subcutaneous Q24H  . ferrous sulfate  325 mg Oral TID PC  .  filgrastim  300 mcg Subcutaneous Daily  . iron polysaccharides  150 mg Oral Daily  . pantoprazole  40 mg Oral Daily  . potassium chloride SA  20 mEq Oral Daily  . sucralfate  1 g Oral QID  . [START ON 01/23/2013] warfarin   Does not apply Once    Assessment: 52 y.o female slipped in store 6/5 -> rt supracondylar femur fx S/p ORIF 01/21/13 PM. Baseline INR = 1.07.  Starting coumadin for VTE prophylaxis. Also to receive Lovenox 40 mg SQ q24h until INR =/>1.8 .  Pltc 267K , Hgb 9.5.  CrCl ~ 85 ml/min. No bleeding reported.   Goal of Therapy:  INR 2-3 Monitor platelets by anticoagulation protocol: Yes   Plan:  Coumadin 7.5 mg po today  INR daily  Coumadin education book and video ordered.  Noah Delaine, RPh Clinical Pharmacist Pager: [phone] 01/22/2013,4:49 PM

## 2013-01-22 NOTE — Progress Notes (Signed)
UR COMPLETED  

## 2013-01-22 NOTE — Evaluation (Addendum)
Physical Therapy Evaluation Patient Details Name: Elizabeth Orr MRN: [id number] DOB: 1961-01-31 Today's Date: 01/22/2013 Time: 4098-1191 PT Time Calculation (min): 39 min  PT Assessment / Plan / Recommendation Clinical Impression  52 yo female s/p R supracondylar femur ORIF. Hx breast cancer, polysubstance abuse. On eval, pt required +2 assist for safe mobility-able to perform stand pivot to recliner with RW. Husband plans to have ramp built for entry into home however there is a chance pt may have to learn to "bump" pt up steps using wheelchair initially. Recommend CIR consult to maximize independence and safety with mobility.  If CIR is not an option, pt would like to d/c home.    PT Assessment  Patient needs continued PT services    Follow Up Recommendations  CIR    Does the patient have the potential to tolerate intense rehabilitation      Barriers to Discharge        Equipment Recommendations  Wheelchair ;Wheelchair cushion ;Agricultural consultant with 5" wheels    Recommendations for Other Services OT consult   Frequency Min 4X/week    Precautions / Restrictions Precautions Precautions: Fall Required Braces or Orthoses: Knee Immobilizer - Right Knee Immobilizer - Right: On at all times Restrictions Weight Bearing Restrictions: Yes RLE Weight Bearing: Non weight bearing   Pertinent Vitals/Pain 1/10 R LE at rest; 10/10 R LE with activity      Mobility  Bed Mobility Bed Mobility: Supine to Sit Supine to Sit: 4: Min assist;HOB elevated;With rails Details for Bed Mobility Assistance: Increased time. Assistance for R LE off bed. heavy reliance on rail. VCS safety, technqiue Transfers Transfers: Sit to Stand;Stand to Sit;Stand Pivot Transfers Sit to Stand: 1: +2 Total assist;From elevated surface;From bed Sit to Stand: Patient Percentage: 60% Stand to Sit: 4: Min assist;To chair/3-in-1;With armrests Stand Pivot Transfers: 1: +2 Total assist Stand Pivot Transfers: Patient  Percentage: 70% Details for Transfer Assistance: Highly elevated bed. VCS safety, technique, hand placement. Assist to rise, stabilize, support R LE while standing,during transfer, sitting. Stand pivot, bed>recliner towards L side. Ambulation/Gait Ambulation/Gait Assistance: Not tested (comment)    Exercises     PT Diagnosis: Difficulty walking;Abnormality of gait;Generalized weakness;Acute pain  PT Problem List: Decreased strength;Decreased range of motion;Decreased activity tolerance;Decreased mobility;Pain;Decreased knowledge of precautions;Decreased knowledge of use of DME PT Treatment Interventions: DME instruction;Gait training;Functional mobility training;Therapeutic activities;Therapeutic exercise;Patient/family education;Wheelchair mobility training   PT Goals Acute Rehab PT Goals PT Goal Formulation: With patient/family Time For Goal Achievement: 02/05/13 Potential to Achieve Goals: Good Pt will go Supine/Side to Sit: with supervision PT Goal: Supine/Side to Sit - Progress: Goal set today Pt will go Sit to Supine/Side: with supervision PT Goal: Sit to Supine/Side - Progress: Goal set today Pt will go Sit to Stand: with supervision PT Goal: Sit to Stand - Progress: Goal set today Pt will go Stand to Sit: with supervision PT Goal: Stand to Sit - Progress: Goal set today Pt will Transfer Bed to Chair/Chair to Bed: with supervision PT Transfer Goal: Bed to Chair/Chair to Bed - Progress: Goal set today Pt will Ambulate: 1 - 15 feet;with supervision;with rolling walker PT Goal: Ambulate - Progress: Goal set today Pt/Family will verbalize understanding of how to Go Up / Down Stairs with wheelchair and +2 assist for safety ("bump technique") PT Goal: Up/Down Stairs - Progress: Goal set today Pt will Propel Wheelchair: 51 - 150 feet;with supervision PT Goal: Propel Wheelchair - Progress: Goal set today  Visit Information  Last PT  Received On: 01/22/13 Assistance Needed: +2  (safety) PT/OT Co-Evaluation/Treatment: Yes    Subjective Data  Subjective: Dont hurt me! I'll do what I can Patient Stated Goal: home   Prior Functioning  Home Living Lives With: Spouse Available Help at Discharge: Available 24 hours/day Type of Home: Mobile home Home Access: Stairs to enter Entrance Stairs-Number of Steps: 3 Entrance Stairs-Rails: None Home Layout: One level Bathroom Shower/Tub: Health visitor: Standard Bathroom Accessibility: Yes How Accessible: Accessible via walker Home Adaptive Equipment: None Prior Function Level of Independence: Independent Needs Assistance: Light Housekeeping Able to Take Stairs?: Yes Driving: Yes Vocation: On disability Communication Communication: No difficulties Dominant Hand: Right    Cognition  Cognition Arousal/Alertness: Awake/Orr Behavior During Therapy: WFL for tasks assessed/performed Overall Cognitive Status: Within Functional Limits for tasks assessed    Extremity/Trunk Assessment Right Lower Extremity Assessment RLE ROM/Strength/Tone: Unable to fully assess;Due to pain;Deficits RLE ROM/Strength/Tone Deficits: moves ankle well Left Lower Extremity Assessment LLE ROM/Strength/Tone: Deficits LLE ROM/Strength/Tone Deficits: Strength at least 3+/5 with functional activity Trunk Assessment Trunk Assessment: Normal   Balance    End of Session PT - End of Session Equipment Utilized During Treatment: Gait belt Activity Tolerance: Patient limited by pain;Patient limited by fatigue Patient left: in chair;with call bell/phone within reach;with family/visitor present Nurse Communication: Mobility status;Precautions;Weight bearing status  GP     Elizabeth Orr, MPT Pager: [phone]

## 2013-01-22 NOTE — Progress Notes (Addendum)
Subjective: 1 Day Post-Op Procedure(s) (LRB): INTRAMEDULLARY (IM) RETROGRADE FEMORAL NAILING (Right)  Activity level:  nwb r leg Diet tolerance:  ok Voiding:  Foley to come out Patient reports pain as 5 on 0-10 scale.    Objective: Vital signs in last 24 hours: Temp:  [97.8 F (36.6 C)-98.2 F (36.8 C)] 98.1 F (36.7 C) (06/06 0602) Pulse Rate:  [78-105] 89 (06/06 0602) Resp:  [16-24] 18 (06/06 0602) BP: (132-157)/(74-105) 153/74 mmHg (06/06 0602) SpO2:  [96 %-100 %] 99 % (06/06 0602)  Labs:  Recent Labs  01/21/13 1543 01/22/13 0640  HGB 11.1* 9.5*    Recent Labs  01/21/13 1543 01/22/13 0640  WBC 5.2 6.2  RBC 3.82* 3.33*  HCT 35.1* 31.2*  PLT 347 267    Recent Labs  01/21/13 1543  NA 139  K 3.6  CL 103  CO2 25  BUN <3*  CREATININE 0.49*  GLUCOSE 150*  CALCIUM 8.9   No results found for this basename: LABPT, INR,  in the last 72 hours  Physical Exam:  Neurologically intact ABD soft Neurovascular intact Sensation intact distally Intact pulses distally Dorsiflexion/Plantar flexion intact No cellulitis present Compartment soft Brace on  Assessment/Plan:  1 Day Post-Op Procedure(s) (LRB): INTRAMEDULLARY (IM) RETROGRADE FEMORAL NAILING (Right) Advance diet Up with therapy Lovenox/ scd with pharmacy consult to begin Coumadin.Coumadin therapy x4 weeks. Brace to stay on / nwb right leg Home vs snf    Yarixa Lightcap R 01/22/2013, 8:16 AM

## 2013-01-22 NOTE — Anesthesia Postprocedure Evaluation (Signed)
  Anesthesia Post-op Note  Patient: Elizabeth Orr  Procedure(s) Performed: Procedure(s): INTRAMEDULLARY (IM) RETROGRADE FEMORAL NAILING (Right)  Patient Location: PACU  Anesthesia Type:General  Level of Consciousness: awake  Airway and Oxygen Therapy: Patient Spontanous Breathing and Patient connected to nasal cannula oxygen  Post-op Pain: moderate  Post-op Assessment: Post-op Vital signs reviewed, Patient's Cardiovascular Status Stable, Respiratory Function Stable, Patent Airway and No signs of Nausea or vomiting  Post-op Vital Signs: Reviewed and stable  Complications: No apparent anesthesia complications

## 2013-01-22 NOTE — Op Note (Signed)
NAMEMADYLIN, Elizabeth Orr               ACCOUNT NO.:  [id number]  MEDICAL RECORD NO.:  [id number]  LOCATION:  [id number]                        FACILITY:  [facility]  PHYSICIAN:  Lubertha Basque. Chael Urenda, M.D.DATE OF BIRTH:  1961-07-19  DATE OF PROCEDURE:  01/21/2013 DATE OF DISCHARGE:                              OPERATIVE REPORT   PREOPERATIVE DIAGNOSIS:  Right supracondylar femur fracture.  POSTOPERATIVE DIAGNOSIS:  Right supracondylar femur fracture.  PROCEDURE:  Right supracondylar femur fracture open reduction and internal fixation.  ANESTHESIA:  General.  ATTENDING SURGEON:  Lubertha Basque. Jerl Santos, M.D.  ASSISTINGDorathy Daft McKenzie PA-C.  INDICATION FOR PROCEDURE:  The patient is a 52 year old woman who slipped in a store earlier today and suffered a terrible injury to her right knee.  This was completely displaced.  She had an intra-articular split and some comminution of a very distal femur fracture.  She is offered ORIF in hopes of realigning her knee and allowing her to potentially sit and stand and walk.  Informed operative consent was obtained after discussion of possible complications including reaction to anesthesia, infection, DVT, PE, and death.  The high complication in her case was also stressed with her concomitant history of breast cancer and chemotherapy.  SUMMARY OF FINDINGS AND PROCEDURE:  Under general anesthesia, through some small incisions, we reduced her fracture fairly in anatomic fashion and stabilized it with a retrograde nail by DePuy.  This was locked distally with 4 screws and proximally with one.  We used fluoroscopy throughout the case to make appropriate intraoperative decisions and read all of these views myself.  DESCRIPTION OF PROCEDURE:  The patient was taken to the operating suite where general anesthetic was applied without difficulty.  She was positioned supine and prepped and draped in normal sterile fashion. After administration of IV Kefzol and  appropriate time-out, we performed some closed reduction maneuvers on her distal femur fracture.  This had an intra-articular split, and we were able to use the [facility] tong clamp to realign the intra-articular split and then placed various towels and the triangle, and we were able to reduce the supracondylar area.  Once this was accomplished, I made an incision along the medial border of the patellar tendon.  I dissected down into the joint, placed a guidewire up into the femur, seen to be intramedullary on 2 views.  We subsequently used a starter reamer, which was about 12 or 13 mm in size.  I then over- reamed the canal up to a diameter of 12 mm followed by placement of a size 10.5 DePuy retrograde nail.  This was then locked with 2 distal transverse screws and 2 oblique screws.  We seemed to achieve good purchase.  I removed the King tong clamp.  Fluoroscopy used in 2 planes and showed good reduction of the fracture.  We then placed the 1 proximal locking screw freehand.  Again, fluoroscopy was used to confirm good placement of the hardware.  The knee was ranged under fluoroscopy. I irrigated the joint.  On one x-ray view, the nail looked to be slightly prominent in the joint, and I opened the joint and then probe and it seemed to be flush with the articular  surface.  The knee was again irrigated followed by reapproximation of deep tissues with Vicryl followed by skin closure with staples at the various incision sites. Adaptic was applied followed by dry gauze and loose Ace wrap.  ESTIMATED BLOOD LOSS AND FLUIDS:  Can be obtained from anesthesia records.  DISPOSITION:  The patient was extubated in the operating room and taken to recovery in stable condition.  She was to be admitted back to the [facility] for appropriate postop care to include perioperative antibiotic and Lovenox plus Coumadin for DVT prophylaxis.  We will obtain PT and  rehab consultation.     Lubertha Basque Jerl Santos, M.D.     PGD/MEDQ  D:  01/21/2013  T:  01/22/2013  Job:  [id number]

## 2013-01-22 NOTE — Evaluation (Signed)
Occupational Therapy Evaluation Patient Details Name: Elizabeth Orr MRN: [id number] DOB: 09-Feb-1961 Today's Date: 01/22/2013 Time: 1478-2956 OT Time Calculation (min): 39 min  OT Assessment / Plan / Recommendation Clinical Impression  Pt is a 52 yr old female admitted for ORIF of distal right femur fracture secondary to fall.  Pt overall presents at a total assist level for most simulated selfcare tasks and functional transfers.  Feel pt will benefit from acute care OT services to help increase overall independence.  Pt will also benefit from CIR level therapies to reach a supervision level for discharge home.      OT Assessment  Patient needs continued OT Services    Follow Up Recommendations  CIR    Barriers to Discharge None    Equipment Recommendations  3 in 1 bedside comode       Frequency  Min 2X/week    Precautions / Restrictions Precautions Precautions: Fall Required Braces or Orthoses: Knee Immobilizer - Right Knee Immobilizer - Right: On at all times Restrictions Weight Bearing Restrictions: Yes RLE Weight Bearing: Non weight bearing   Pertinent Vitals/Pain Pain 1/10 at rest increasing with activity and mobility    ADL  Eating/Feeding: Simulated;Independent Where Assessed - Eating/Feeding: Chair Grooming: Simulated;Set up Where Assessed - Grooming: Unsupported sitting Upper Body Bathing: Simulated;Set up Where Assessed - Upper Body Bathing: Unsupported sitting Lower Body Bathing: Simulated;+2 Total assistance Lower Body Bathing: Patient Percentage: 60% Where Assessed - Lower Body Bathing: Supported sit to stand Upper Body Dressing: Simulated;Set up Where Assessed - Upper Body Dressing: Unsupported sitting Lower Body Dressing: Simulated;+2 Total assistance Lower Body Dressing: Patient Percentage: 60% Where Assessed - Lower Body Dressing: Supported sit to stand Toilet Transfer: Simulated;+2 Total assistance Toilet Transfer: Patient Percentage: 60% Doctor, general practice Method: Surveyor, minerals:  (simulated bedside chair) Toileting - Architect and Hygiene: +2 Total assistance Toileting - Clothing Manipulation and Hygiene: Patient Percentage: 60% Where Assessed - Glass blower/designer Manipulation and Hygiene: Sit to stand from 3-in-1 or toilet Tub/Shower Transfer Method: Not assessed Equipment Used: Rolling walker;Knee Immobilizer Transfers/Ambulation Related to ADLs: Pt performed stand pivot transfer from bed to bedside chair with total assist +2 (pt 60%) with use of RW.     ADL Comments: Pt needs total assist +2 for sit to stand.  Unable to stand from EOB without it being elevated secondary to LLE pain.  Unable to reach either foot for dressing tasks sitting EOB.  Will benefit from AE education as well to assist with this.      OT Diagnosis: Generalized weakness;Acute pain  OT Problem List: Decreased strength;Impaired balance (sitting and/or standing);Decreased knowledge of use of DME or AE;Pain OT Treatment Interventions: Self-care/ADL training;Therapeutic activities;Energy conservation;Patient/family education;DME and/or AE instruction;Balance training   OT Goals Acute Rehab OT Goals OT Goal Formulation: With patient Time For Goal Achievement: 01/29/13 Potential to Achieve Goals: Good ADL Goals Pt Will Perform Lower Body Bathing: with min assist;Sit to stand from bed;with adaptive equipment ADL Goal: Lower Body Bathing - Progress: Goal set today Pt Will Perform Lower Body Dressing: with min assist;with adaptive equipment;Sit to stand from bed ADL Goal: Lower Body Dressing - Progress: Goal set today Pt Will Transfer to Toilet: with min assist;with DME;Stand pivot transfer;3-in-1;Maintaining weight bearing status ADL Goal: Toilet Transfer - Progress: Goal set today Pt Will Perform Toileting - Clothing Manipulation: with min assist;Sitting on 3-in-1 or toilet;Standing ADL Goal: Toileting - Clothing Manipulation -  Progress: Goal set today Pt Will Perform Toileting -  Hygiene: with min assist;Sit to stand from 3-in-1/toilet ADL Goal: Toileting - Hygiene - Progress: Goal set today  Visit Information  Last OT Received On: 01/22/13 Assistance Needed: +2    Subjective Data  Subjective: You're gonna have to go slow with me.   Patient Stated Goal: Pt did not state.   Prior Functioning     Home Living Lives With: Spouse Available Help at Discharge: Available 24 hours/day Type of Home: Mobile home Home Access: Stairs to enter Entrance Stairs-Number of Steps: 3 Entrance Stairs-Rails: None Home Layout: One level Bathroom Shower/Tub: Health visitor: Standard Bathroom Accessibility: Yes How Accessible: Accessible via walker Home Adaptive Equipment: None Prior Function Level of Independence: Independent Needs Assistance: Light Housekeeping Able to Take Stairs?: Yes Driving: Yes Vocation: On disability Communication Communication: No difficulties Dominant Hand: Right         Vision/Perception Vision - History Baseline Vision: No visual deficits Patient Visual Report: No change from baseline Vision - Assessment Eye Alignment: Within Functional Limits Vision Assessment: Vision not tested Perception Perception: Within Functional Limits Praxis Praxis: Intact   Cognition  Cognition Arousal/Alertness: Awake/alert Behavior During Therapy: WFL for tasks assessed/performed Overall Cognitive Status: Within Functional Limits for tasks assessed    Extremity/Trunk Assessment Right Upper Extremity Assessment RUE ROM/Strength/Tone: Within functional levels RUE Sensation: WFL - Light Touch RUE Coordination: WFL - gross/fine motor Left Upper Extremity Assessment LUE ROM/Strength/Tone: Within functional levels LUE Sensation: WFL - Light Touch LUE Coordination: WFL - gross/fine motor Right Lower Extremity Assessment RLE ROM/Strength/Tone: Unable to fully assess;Due to  pain;Deficits RLE ROM/Strength/Tone Deficits: moves ankle well Left Lower Extremity Assessment LLE ROM/Strength/Tone: Deficits LLE ROM/Strength/Tone Deficits: Strength at least 3+/5 with functional activity Trunk Assessment Trunk Assessment: Normal     Mobility Bed Mobility Bed Mobility: Supine to Sit Supine to Sit: 4: Min assist;HOB elevated;With rails Details for Bed Mobility Assistance: Increased time. Assistance for R LE off bed. heavy reliance on rail. VCS safety, technqiue Transfers Sit to Stand: 1: +2 Total assist;From elevated surface;From bed Sit to Stand: Patient Percentage: 60% Stand to Sit: 4: Min assist;To chair/3-in-1;With armrests Details for Transfer Assistance: Highly elevated bed. VCS safety, technique, hand placement. Assist to rise, stabilize, support R LE while standing and during transfer. Stand pivot, bed>recliner.         Balance Balance Balance Assessed: Yes Static Sitting Balance Static Sitting - Balance Support: No upper extremity supported;Feet supported Static Sitting - Level of Assistance: 5: Stand by assistance   End of Session OT - End of Session Equipment Utilized During Treatment: Right knee immobilizer;Gait belt Activity Tolerance: Patient limited by pain Patient left: in chair;with call bell/phone within reach;with family/visitor present Nurse Communication: Mobility status     Darian Cansler OTR/L Pager number [phone] 01/22/2013, 3:54 PM

## 2013-01-22 NOTE — Consult Note (Signed)
Physical Medicine and Rehabilitation Consult  Reason for Consult: Right supracondylar femur fracture. Referring Physician: Dr. Jerl Santos   HPI: Elizabeth Orr is a 52 y.o. female with h/o anxiety, DOE, breast cancer with ongoing chemo; admitted on 01/21/13 past fall at a convenience store. Patient slipped, twisted her right knee and fell to the ground with onset of pain, knee deformity and inability to walk. Xrays with comminuted displaced right supracondylar femur fracture and patient taken to OR  for ORIF by Dr. Jerl Santos. Post op to be NWB in Knee immobilizer for 2 months. PT/OT evaluations to be done today.   Review of Systems  HENT: Negative for hearing loss.   Eyes: Negative for blurred vision and double vision.  Respiratory: Positive for cough and shortness of breath (past month).   Cardiovascular: Negative for chest pain and palpitations.  Gastrointestinal: Positive for heartburn, nausea, vomiting and abdominal pain (due to ovarian cyst.).  Musculoskeletal: Positive for back pain, joint pain and falls.       Bilateral knee instability with falls.  Neurological: Negative for dizziness and headaches.  Psychiatric/Behavioral: Positive for depression. The patient is nervous/anxious.    Past Medical History  Diagnosis Date  . Kidney stones 1989  . GERD (gastroesophageal reflux disease)   . Arthritis   . History of stomach ulcers 2003/2007    Hx of esophageal ulcers and stomach ulcers due to reflux  . Back pain   . Chronic leg pain   . Polysubstance abuse   . Depression   . Anxiety   . Exertional shortness of breath   . History of blood transfusion     "recently had my 2nd; related to chemo" (01/21/2013)  . ZOXWRUEA(540.9)     "monthly" (01/21/2013)  . Breast cancer 06/08/12    right mastectomy  . Infiltrating ductal carcinoma of breast 07/09/2012    Neijstrom:  chemo   Past Surgical History  Procedure Laterality Date  . Esophagogastroduodenoscopy    . Esophageal dilation     . Portacath placement  07/22/2012    Procedure: INSERTION PORT-A-CATH;  Surgeon: Fabio Bering, MD;  Location: [facility];  Service: General;  Laterality: Left;  left subclavian  . Esophagogastroduodenoscopy (egd) with esophageal dilation  10/04/2005    Rourk-Extensive geographic ulcerations distal third of the tubular esoophagus consistent with severe ulcerative relux esophagitis, early stricture formation status post dilation as described above. 2.  Multiple gastric ulcers without bleeding stigmata as described above. otherwise normal stomach.  3. large bulbar ulcer without bleeding stigmata. otherwise D1 and D2 appeared normal.  . Colonoscopy, esophagogastroduodenoscopy (egd) and esophageal dilation  08/21/2012    Procedure: COLONOSCOPY, ESOPHAGOGASTRODUODENOSCOPY (EGD) AND ESOPHAGEAL DILATION;  Surgeon: Corbin Ade, MD;  Location: [facility];  Service: Endoscopy;  Laterality: N/A;  9:30  . Breast biopsy Right 04/2012  . Mastectomy Right 06/08/12  . Tubal ligation  1989   Family History  Problem Relation Age of Onset  . Heart attack Father   . COPD Father   . Alcohol abuse Brother   . Alcohol abuse Brother   . Diabetes Mother    Social History:  Married. Independent without AD. 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 She reports that she quit smoking about 17 months ago. She has never used smokeless tobacco. She reports that she drinks about 3.6 ounces of alcohol per week. She reports that she does not use illicit drugs.   Allergies  Allergen Reactions  . Citalopram  Makes her feel drowsy/sleepy.  . Effexor (Venlafaxine Hcl)     Makes pt feel very bad,will not take again.  . Motrin (Ibuprofen) Swelling    Lips swell  . Trazodone And Nefazodone     Makes pt feel very bad and "hung over"    Medications Prior to Admission  Medication Sig Dispense Refill  . albuterol (PROVENTIL HFA;VENTOLIN HFA) 108 (90 BASE) MCG/ACT inhaler Inhale 2 puffs into the lungs every 6 (six)  hours as needed for wheezing.  1 Inhaler  2  . Alum & Mag Hydroxide-Simeth (MAGIC MOUTHWASH) SOLN Take 5 mLs by mouth 4 (four) times daily as needed.       . chlorpheniramine-HYDROcodone (TUSSIONEX) 10-8 MG/5ML LQCR Take 5 mLs by mouth at bedtime as needed. 60ml no refills      . dexamethasone (DECADRON) 4 MG tablet Take 2 tablets @ 9pm PM before chemo then 2 tablets @ 3am the AM of chemo. Then take 2 tabs in AM and 2 tabs in PM for 1 day after chemo.  30 tablet  0  . hydrOXYzine (VISTARIL) 50 MG capsule Take 50 mg by mouth at bedtime as needed for itching. Take two tablets at bedtime as needed.      . iron polysaccharides (NIFEREX) 150 MG capsule Take 150 mg by mouth daily.      Marland Kitchen lidocaine-prilocaine (EMLA) cream Apply a quarter sized amount to port site 1 hour prior to chemo. Do not rub in.  Cover with plastic.      Marland Kitchen LORazepam (ATIVAN) 1 MG tablet Take 1 mg by mouth every 4 (four) hours as needed. Take 1 tablet every 4 hours IF needed for nausea/vomiting.      . magnesium hydroxide (MILK OF MAGNESIA) 400 MG/5ML suspension Take 30 mLs by mouth daily as needed for constipation.      Marland Kitchen omeprazole (PRILOSEC) 20 MG capsule Take 1 capsule (20 mg total) by mouth daily.  30 capsule  2  . ondansetron (ZOFRAN) 8 MG tablet Take 8 mg by mouth. Starting the day after chemo, take 1 tablet in the am and 1 tablet in the pm for 2 days. Then may take 1 tablet two times a day IF needed for nausea/vomiting.      . pegfilgrastim (NEULASTA) 6 MG/0.6ML injection Inject 6 mg into the skin every 14 (fourteen) days.      . potassium chloride SA (K-DUR,KLOR-CON) 20 MEQ tablet Take 1 tablet (20 mEq total) by mouth daily.  30 tablet  3  . sucralfate (CARAFATE) 1 GM/10ML suspension Take 1 g by mouth 4 (four) times daily.      . [DISCONTINUED] HYDROcodone-acetaminophen (NORCO/VICODIN) 5-325 MG per tablet Take 1 tablet by mouth every 6 (six) hours as needed for pain.        Home:    Functional History:   Functional Status:   Mobility:          ADL:    Cognition: Cognition Orientation Level: Oriented X4    Blood pressure 153/74, pulse 89, temperature 98.1 F (36.7 C), temperature source Oral, resp. rate 18, SpO2 99.00%.  Physical Exam  Nursing note and vitals reviewed. Constitutional: She is oriented to person, place, and time. She appears well-developed and well-nourished.  HENT:  Head: Normocephalic and atraumatic.  Eyes: Pupils are equal, round, and reactive to light.  Neck: Normal range of motion. Neck supple.  Cardiovascular: Normal rate and regular rhythm.   Pulmonary/Chest: Effort normal. No respiratory distress. She has no wheezes.  Right mastectomy  Abdominal: Soft. Bowel sounds are normal.  Musculoskeletal: She exhibits tenderness.  Right knee splinted/wrapped/dressed, appropriately tender. Left knee with valgus deformity, no swelling  Neurological: She is alert and oriented to person, place, and time.  No focal motor or sensory deficits. Sensation normal. Strength only limited by pain.  Psychiatric: She has a normal mood and affect. Her behavior is normal. Judgment and thought content normal.    Results for orders placed during the hospital encounter of 01/21/13 (from the past 24 hour(s))  CBC WITH DIFFERENTIAL     Status: Abnormal   Collection Time    01/21/13  3:43 PM      Result Value Range   WBC 5.2  4.0 - 10.5 K/uL   RBC 3.82 (*) 3.87 - 5.11 MIL/uL   Hemoglobin 11.1 (*) 12.0 - 15.0 g/dL   HCT 45.4 (*) 09.8 - 11.9 %   MCV 91.9  78.0 - 100.0 fL   MCH 29.1  26.0 - 34.0 pg   MCHC 31.6  30.0 - 36.0 g/dL   RDW 14.7 (*) 82.9 - 56.2 %   Platelets 347  150 - 400 K/uL   Neutrophils Relative % 61  43 - 77 %   Neutro Abs 3.2  1.7 - 7.7 K/uL   Lymphocytes Relative 29  12 - 46 %   Lymphs Abs 1.5  0.7 - 4.0 K/uL   Monocytes Relative 10  3 - 12 %   Monocytes Absolute 0.5  0.1 - 1.0 K/uL   Eosinophils Relative 0  0 - 5 %   Eosinophils Absolute 0.0  0.0 - 0.7 K/uL   Basophils  Relative 0  0 - 1 %   Basophils Absolute 0.0  0.0 - 0.1 K/uL  BASIC METABOLIC PANEL     Status: Abnormal   Collection Time    01/21/13  3:43 PM      Result Value Range   Sodium 139  135 - 145 mEq/L   Potassium 3.6  3.5 - 5.1 mEq/L   Chloride 103  96 - 112 mEq/L   CO2 25  19 - 32 mEq/L   Glucose, Bld 150 (*) 70 - 99 mg/dL   BUN <3 (*) 6 - 23 mg/dL   Creatinine, Ser 1.30 (*) 0.50 - 1.10 mg/dL   Calcium 8.9  8.4 - 86.5 mg/dL   GFR calc non Af Amer >90  >90 mL/min   GFR calc Af Amer >90  >90 mL/min  URINALYSIS, ROUTINE W REFLEX MICROSCOPIC     Status: Abnormal   Collection Time    01/21/13  6:24 PM      Result Value Range   Color, Urine AMBER (*) YELLOW   APPearance CLOUDY (*) CLEAR   Specific Gravity, Urine 1.020  1.005 - 1.030   pH 6.0  5.0 - 8.0   Glucose, UA NEGATIVE  NEGATIVE mg/dL   Hgb urine dipstick NEGATIVE  NEGATIVE   Bilirubin Urine NEGATIVE  NEGATIVE   Ketones, ur NEGATIVE  NEGATIVE mg/dL   Protein, ur NEGATIVE  NEGATIVE mg/dL   Urobilinogen, UA 0.2  0.0 - 1.0 mg/dL   Nitrite NEGATIVE  NEGATIVE   Leukocytes, UA NEGATIVE  NEGATIVE  SURGICAL PCR SCREEN     Status: Abnormal   Collection Time    01/21/13  8:01 PM      Result Value Range   MRSA, PCR NEGATIVE  NEGATIVE   Staphylococcus aureus POSITIVE (*) NEGATIVE  CBC     Status: Abnormal  Collection Time    01/22/13  6:40 AM      Result Value Range   WBC 6.2  4.0 - 10.5 K/uL   RBC 3.33 (*) 3.87 - 5.11 MIL/uL   Hemoglobin 9.5 (*) 12.0 - 15.0 g/dL   HCT 16.1 (*) 09.6 - 04.5 %   MCV 93.7  78.0 - 100.0 fL   MCH 28.5  26.0 - 34.0 pg   MCHC 30.4  30.0 - 36.0 g/dL   RDW 40.9 (*) 81.1 - 91.4 %   Platelets 267  150 - 400 K/uL   Dg Chest 1 View  01/21/2013   *RADIOLOGY REPORT*  Clinical Data: Knee injury.  Fall.Breast cancer.  Right mastectomy. Chest pain.  CHEST - 1 VIEW  Comparison: 01/13/2013.  Findings: No pneumothorax.  Cardiopericardial silhouette within normal limits. Mediastinal contours normal. Trachea midline.   No airspace disease or effusion.  Left subclavian power port is present with the tip in the mid SVC.  Right axillary dissection clips are present.  IMPRESSION: No acute cardiopulmonary disease.  Postoperative and postprocedural changes of the chest.   Original Report Authenticated By: Andreas Newport, M.D.   Dg Femur Right  01/21/2013   *RADIOLOGY REPORT*  Clinical Data: Right femur fracture ORIF  DG C-ARM 1-60 MIN,RIGHT FEMUR - 2 VIEW  Comparison: Preoperative imaging 01/21/2013  Findings: There has been intramedullary rod and screw fixation of the previously seen comminuted distal femoral fracture.  Fracture fragments are in near anatomic alignment.  No evidence for hardware failure.  The proximal portion of the fixation component is not completely imaged.  IMPRESSION: Intraoperative findings as above.   Original Report Authenticated By: Christiana Pellant, M.D.   Dg Knee 1-2 Views Right  01/21/2013   *RADIOLOGY REPORT*  Clinical Data: Pain post trauma  RIGHT KNEE - 1-2 VIEW  Comparison: None.  Findings: Frontal and lateral views were obtained.  There is a comminuted fracture of the distal femoral metaphysis with posterior displacement of the distal major fracture fragments with respect proximal fragment.  There is approximately 4 cm of overriding.  The femoral shaft is located immediately superior to the patella.  There is no gross dislocation.  IMPRESSION: Comminuted displaced fracture of the distal femoral metaphysis as described.   Original Report Authenticated By: Bretta Bang, M.D.   Dg C-arm 1-60 Min  01/21/2013   *RADIOLOGY REPORT*  Clinical Data: Right femur fracture ORIF  DG C-ARM 1-60 MIN,RIGHT FEMUR - 2 VIEW  Comparison: Preoperative imaging 01/21/2013  Findings: There has been intramedullary rod and screw fixation of the previously seen comminuted distal femoral fracture.  Fracture fragments are in near anatomic alignment.  No evidence for hardware failure.  The proximal portion of the fixation  component is not completely imaged.  IMPRESSION: Intraoperative findings as above.   Original Report Authenticated By: Christiana Pellant, M.D.    Assessment/Plan: Diagnosis: right supracondylar femur fx 1. Does the need for close, 24 hr/day medical supervision in concert with the patient's rehab needs make it unreasonable for this patient to be served in a less intensive setting? Yes 2. Co-Morbidities requiring supervision/potential complications: breast ca, anemia, pain mgt, OA of back, chronic low back pain 3. Due to bladder management, bowel management, safety, skin/wound care, disease management, medication administration, pain management and patient education, does the patient require 24 hr/day rehab nursing? Yes 4. Does the patient require coordinated care of a physician, rehab nurse, PT (1-2 hrs/day, 5 days/week) and OT (1-2 hrs/day, 5 days/week) to address physical and  functional deficits in the context of the above medical diagnosis(es)? Yes Addressing deficits in the following areas: balance, endurance, locomotion, strength, transferring, bowel/bladder control, bathing, dressing, feeding, grooming, toileting and psychosocial support 5. Can the patient actively participate in an intensive therapy program of at least 3 hrs of therapy per day at least 5 days per week? Yes, potentially 6. The potential for patient to make measurable gains while on inpatient rehab is excellent 7. Anticipated functional outcomes upon discharge from inpatient rehab are supervision to min assist with PT, supervision to min assist with OT, n/a with SLP. 8. Estimated rehab length of stay to reach the above functional goals is: ?2weeks 9. Does the patient have adequate social supports to accommodate these discharge functional goals? Yes 10. Anticipated D/C setting: Home 11. Anticipated post D/C treatments: HH therapy 12. Overall Rehab/Functional Prognosis: excellent  RECOMMENDATIONS: This patient's condition is  appropriate for continued rehabilitative care in the following setting: CIR potentially Patient has agreed to participate in recommended program. Yes Note that insurance prior authorization may be required for reimbursement for recommended care.  Comment:Await therapy evals. Pt is only POD #1. Rehab RN to follow up.   Ranelle Oyster, MD, [place]     01/22/2013

## 2013-01-22 NOTE — Progress Notes (Signed)
I await therapy evaluations to assist in determining rehab venue options. I will follow up on Monday. [phone]

## 2013-01-22 NOTE — Care Management Note (Signed)
CARE MANAGEMENT NOTE 01/22/2013  Patient:  Elizabeth Orr, Elizabeth Orr   Account Number:  [id number]  Date Initiated:  01/22/2013  Documentation initiated by:  Vance Peper  Subjective/Objective Assessment:   52 yr old female s/p right femur IM nailing.     Action/Plan:   CM spoke with patient concerning home health and DME needs at discharge. Patient is waiting to see if inpatient rehab can accept her. Discussed plan for home as well.   Anticipated DC Date:  01/25/2013   Anticipated DC Plan:           Choice offered to / List presented to:             Status of service:  In process, will continue to follow Medicare Important Message given?   (If response is "NO", the following Medicare IM given date fields will be blank) Date Medicare IM given:   Date Additional Medicare IM given:    Discharge Disposition:    Per UR Regulation:    If discussed at Long Length of Stay Meetings, dates discussed:    Comments:

## 2013-01-23 LAB — BASIC METABOLIC PANEL WITH GFR
BUN: 3 mg/dL — ABNORMAL LOW (ref 6–23)
CO2: 25 meq/L (ref 19–32)
Calcium: 8.7 mg/dL (ref 8.4–10.5)
Chloride: 100 meq/L (ref 96–112)
Creatinine, Ser: 0.48 mg/dL — ABNORMAL LOW (ref 0.50–1.10)
GFR calc Af Amer: >90 mL/min
GFR calc non Af Amer: >90 mL/min
Glucose, Bld: 104 mg/dL — ABNORMAL HIGH (ref 70–99)
Potassium: 3.4 meq/L — ABNORMAL LOW (ref 3.5–5.1)
Sodium: 136 meq/L (ref 135–145)

## 2013-01-23 LAB — PROTIME-INR
INR: 1.22 (ref 0.00–1.49)
Prothrombin Time: 15.2 seconds (ref 11.6–15.2)

## 2013-01-23 LAB — CBC
HCT: 30.6 % — ABNORMAL LOW (ref 36.0–46.0)
Hemoglobin: 9.4 g/dL — ABNORMAL LOW (ref 12.0–15.0)
MCH: 28.7 pg (ref 26.0–34.0)
MCHC: 30.7 g/dL (ref 30.0–36.0)
MCV: 93.6 fL (ref 78.0–100.0)
Platelets: 228 K/uL (ref 150–400)
RBC: 3.27 MIL/uL — ABNORMAL LOW (ref 3.87–5.11)
RDW: 21.5 % — ABNORMAL HIGH (ref 11.5–15.5)
WBC: 12.7 K/uL — ABNORMAL HIGH (ref 4.0–10.5)

## 2013-01-23 MED ORDER — WARFARIN SODIUM 6 MG PO TABS
6.0000 mg | ORAL_TABLET | Freq: Once | ORAL | Status: AC
  Administered 2013-01-23: 6 mg via ORAL
  Filled 2013-01-23: qty 1

## 2013-01-23 NOTE — Progress Notes (Signed)
Occupational Therapy Treatment Patient Details Name: Elizabeth Orr MRN: [id number] DOB: Nov 12, 1960 Today's Date: 01/23/2013 Time: 4098-1191 OT Time Calculation (min): 39 min  OT Assessment / Plan / Recommendation Comments on Treatment Session Pt improving each day and currently st a min assist level for selfcare tasks and functional transfers.  Will need 3:1 and tub bench for home.    Follow Up Recommendations  CIR       Equipment Recommendations  3 in 1 bedside comode;Tub/shower bench    Recommendations for Other Services Rehab consult  Frequency Min 2X/week   Plan Discharge plan remains appropriate    Precautions / Restrictions Precautions Precautions: Fall Required Braces or Orthoses: Knee Immobilizer - Right Knee Immobilizer - Right: On at all times Restrictions Weight Bearing Restrictions: Yes RLE Weight Bearing: Non weight bearing   Pertinent Vitals/Pain pain 5/10 in the RLE    ADL  Lower Body Dressing: Performed;Minimal assistance Where Assessed - Lower Body Dressing: Supported sit to stand Toilet Transfer: Minimal assistance;Simulated Statistician Method: Surveyor, minerals: Other (comment) (simulated to bedside chair) Toileting - Clothing Manipulation and Hygiene: Simulated;Minimal assistance (sit to stand from the bed) Where Assessed - Toileting Clothing Manipulation and Hygiene: Sit to stand from 3-in-1 or toilet Equipment Used: Knee Immobilizer;Rolling walker Transfers/Ambulation Related to ADLs: Pt performed stand pivot transfer to the bedside chair with min assist and use of the RW. ADL Comments: Pt and husband educated on AE for LB selfcare for donning socks.  Pt also issued AE as well.  Moving better today with less pain but still needs min assist.  Pt will also need tub bench and 3:1 for home if she choses not to go to rehab.       OT Goals ADL Goals Pt Will Perform Lower Body Bathing: with min assist;Sit to stand from bed;with  adaptive equipment ADL Goal: Lower Body Dressing - Progress: Met Pt Will Transfer to Toilet: with min assist;with DME;Stand pivot transfer;3-in-1;Maintaining weight bearing status ADL Goal: Toilet Transfer - Progress: Met ADL Goal: Toileting - Clothing Manipulation - Progress: Met ADL Goal: Toileting - Hygiene - Progress: Met Pt Will Perform Tub/Shower Transfer: Tub transfer;Stand pivot transfer;Transfer tub bench;with DME;Ambulation;Maintaining weight bearing status ADL Goal: Tub/Shower Transfer - Progress: Goal set today  Visit Information  Last OT Received On: 01/23/13 Assistance Needed: +1    Subjective Data  Subjective: It's feeling better today than yesterday. Patient Stated Goal: Pt did not state.      Cognition  Cognition Arousal/Alertness: Awake/alert Behavior During Therapy: WFL for tasks assessed/performed Overall Cognitive Status: Within Functional Limits for tasks assessed    Mobility  Bed Mobility Bed Mobility: Supine to Sit Supine to Sit: 5: Supervision;HOB elevated;With rails Transfers Transfers: Sit to Stand Sit to Stand: 4: Min assist;With upper extremity assist;From bed Stand to Sit: 4: Min assist;With upper extremity assist;To chair/3-in-1       Balance Balance Balance Assessed: Yes Static Sitting Balance Static Sitting - Balance Support: Right upper extremity supported;Left upper extremity supported Static Sitting - Level of Assistance: 5: Stand by assistance Static Standing Balance Static Standing - Balance Support: Right upper extremity supported;Left upper extremity supported Static Standing - Level of Assistance: 4: Min assist   End of Session OT - End of Session Equipment Utilized During Treatment: Gait belt Activity Tolerance: Patient limited by fatigue Patient left: in chair;with call bell/phone within reach;with family/visitor present Nurse Communication: Mobility status     Micala Saltsman OTR/L Pager number [phone] 01/23/2013, 9:46  AM

## 2013-01-23 NOTE — Progress Notes (Signed)
Patient doing well. C/o R knee pain, and requesting stronger medications to control pain.  BP 152/82  Pulse 103  Temp(Src) 98.4 F (36.9 C) (Oral)  Resp 18  Ht 4' 11.84" (1.52 m)  Wt 92.1 kg (203 lb 0.7 oz)  BMI 39.86 kg/m2  SpO2 99%  NVI Knee immobilizer intact  POD #2 after right IM nail  - up with PT/OT - patient requesting d/c home, as opposed to rehab - will follow recs of PT team - pain medications reviewed and appear appropriate, I'm not comfortable increasing to stronger meds at this time (on percocet and dilaudid IV). Will request that nursing give 2 percocet tabs for pain, vs 1

## 2013-01-23 NOTE — Progress Notes (Signed)
Physical Therapy Treatment Patient Details Name: Elizabeth Orr MRN: [id number] DOB: 1960-11-12 Today's Date: 01/23/2013 Time:  -     PT Assessment / Plan / Recommendation Comments on Treatment Session  Pt able to perform stand pivot transfers with decreased (A) today but pain cont's to limit & she has still not ambulated any distance.      Follow Up Recommendations  CIR; Home health PT;Supervision/Assistance - 24 hour     Does the patient have the potential to tolerate intense rehabilitation     Barriers to Discharge        Equipment Recommendations  Wheelchair (measurements PT);Wheelchair cushion (measurements PT);Rolling walker with 5" wheels    Recommendations for Other Services OT consult  Frequency Min 4X/week   Plan      Precautions / Restrictions Precautions Precautions: Fall Required Braces or Orthoses: Knee Immobilizer - Right Knee Immobilizer - Right: On at all times Restrictions RLE Weight Bearing: Non weight bearing   Pertinent Vitals/Pain 7/10 RLE.  Pt requesting pain meds.  RN reports pt just received morphine ~30 minutes prior to session.      Mobility  Bed Mobility Bed Mobility: Supine to Sit;Sitting - Scoot to Edge of Bed;Sit to Supine Supine to Sit: 5: Supervision;With rails Sitting - Scoot to Edge of Bed: 5: Supervision Sit to Supine: 4: Min assist Details for Bed Mobility Assistance: (A) for RLE back into bed.  Increased time.  Heavy reliance on rail.   Transfers Transfers: Sit to Stand;Stand to [employer] Transfers Sit to Stand: 3: Mod assist;With upper extremity assist;With armrests;From bed;From chair/3-in-1 Stand to Sit: 4: Min assist;With upper extremity assist;With armrests;To bed;To chair/3-in-1 Stand Pivot Transfers: 4: Min assist Details for Transfer Assistance: Cues for hand placement & R LE positioning to maintain NWBing.  (A) to achieve standing, balance, & controlled descent.  stand pivot bed<>3-in-1.   Pt able to take pivotal hips  but requires cues to reinforce NWBing.   Ambulation/Gait Ambulation/Gait Assistance: Not tested (comment)       PT Goals Acute Rehab PT Goals Time For Goal Achievement: 02/05/13 Potential to Achieve Goals: Good Pt will go Supine/Side to Sit: with supervision PT Goal: Supine/Side to Sit - Progress: Progressing toward goal Pt will go Sit to Supine/Side: with supervision PT Goal: Sit to Supine/Side - Progress: Progressing toward goal Pt will go Sit to Stand: with supervision PT Goal: Sit to Stand - Progress: Progressing toward goal Pt will go Stand to Sit: with supervision PT Goal: Stand to Sit - Progress: Progressing toward goal Pt will Transfer Bed to Chair/Chair to Bed: with supervision PT Transfer Goal: Bed to Chair/Chair to Bed - Progress: Progressing toward goal Pt will Ambulate: 1 - 15 feet;with supervision;with rolling walker Pt will Go Up / Down Stairs: with other equipment (comment) Pt will Propel Wheelchair: 51 - 150 feet;with supervision  Visit Information  Last PT Received On: 01/23/13 Assistance Needed: +1    Subjective Data      Cognition  Cognition Arousal/Alertness: Awake/alert Behavior During Therapy: WFL for tasks assessed/performed Overall Cognitive Status: Within Functional Limits for tasks assessed    Balance     End of Session PT - End of Session Equipment Utilized During Treatment: Gait belt;Right knee immobilizer Activity Tolerance: Patient limited by pain Patient left: in bed;with call bell/phone within reach;with family/visitor present Nurse Communication: Mobility status;Patient requests pain meds     Verdell Face, [place] [phone] 01/23/2013

## 2013-01-23 NOTE — Progress Notes (Signed)
ANTICOAGULATION CONSULT NOTE - Follow up Pharmacy Consult for Coumadin Indication: VTE prophylaxis  Allergies  Allergen Reactions  . Citalopram     Makes her feel drowsy/sleepy.  . Effexor (Venlafaxine Hcl)     Makes pt feel very bad,will not take again.  . Motrin (Ibuprofen) Swelling    Lips swell  . Trazodone And Nefazodone     Makes pt feel very bad and "hung over"    Patient Measurements: Height: 4' 11.84" (152 cm) (taken from 12/08/12 report) Weight: 203 lb 0.7 oz (92.1 kg) (from 01/13/13) IBW/kg (Calculated) : 45.14   Vital Signs: Temp: 98.2 F (36.8 C) (06/07 1309) BP: 146/88 mmHg (06/07 1309) Pulse Rate: 109 (06/07 1309)  Labs:  Recent Labs  01/21/13 1543 01/22/13 0640 01/22/13 1250 01/23/13 0530  HGB 11.1* 9.5*  --  9.4*  HCT 35.1* 31.2*  --  30.6*  PLT 347 267  --  228  LABPROT  --   --  13.8 15.2  INR  --   --  1.07 1.22  CREATININE 0.49* 0.46*  --  0.48*    Estimated Creatinine Clearance: 84.9 ml/min (by C-G formula based on Cr of 0.48).   Medical History: Past Medical History  Diagnosis Date  . Kidney stones 1989  . GERD (gastroesophageal reflux disease)   . Arthritis   . History of stomach ulcers 2003/2007    Hx of esophageal ulcers and stomach ulcers due to reflux  . Back pain   . Chronic leg pain   . Polysubstance abuse   . Depression   . Anxiety   . Exertional shortness of breath   . History of blood transfusion     "recently had my 2nd; related to chemo" (01/21/2013)  . WUJWJXBJ(478.2)     "monthly" (01/21/2013)  . Breast cancer 06/08/12    right mastectomy  . Infiltrating ductal carcinoma of breast 07/09/2012    Neijstrom:  chemo    Medications:  Prescriptions prior to admission  Medication Sig Dispense Refill  . albuterol (PROVENTIL HFA;VENTOLIN HFA) 108 (90 BASE) MCG/ACT inhaler Inhale 2 puffs into the lungs every 6 (six) hours as needed for wheezing.  1 Inhaler  2  . Alum & Mag Hydroxide-Simeth (MAGIC MOUTHWASH) SOLN Take 5  mLs by mouth 4 (four) times daily as needed.       . chlorpheniramine-HYDROcodone (TUSSIONEX) 10-8 MG/5ML LQCR Take 5 mLs by mouth at bedtime as needed. 60ml no refills      . dexamethasone (DECADRON) 4 MG tablet Take 2 tablets @ 9pm PM before chemo then 2 tablets @ 3am the AM of chemo. Then take 2 tabs in AM and 2 tabs in PM for 1 day after chemo.  30 tablet  0  . hydrOXYzine (VISTARIL) 50 MG capsule Take 50 mg by mouth at bedtime as needed for itching. Take two tablets at bedtime as needed.      . iron polysaccharides (NIFEREX) 150 MG capsule Take 150 mg by mouth daily.      Marland Kitchen lidocaine-prilocaine (EMLA) cream Apply a quarter sized amount to port site 1 hour prior to chemo. Do not rub in.  Cover with plastic.      Marland Kitchen LORazepam (ATIVAN) 1 MG tablet Take 1 mg by mouth every 4 (four) hours as needed. Take 1 tablet every 4 hours IF needed for nausea/vomiting.      . magnesium hydroxide (MILK OF MAGNESIA) 400 MG/5ML suspension Take 30 mLs by mouth daily as needed for constipation.      Marland Kitchen  omeprazole (PRILOSEC) 20 MG capsule Take 1 capsule (20 mg total) by mouth daily.  30 capsule  2  . ondansetron (ZOFRAN) 8 MG tablet Take 8 mg by mouth. Starting the day after chemo, take 1 tablet in the am and 1 tablet in the pm for 2 days. Then may take 1 tablet two times a day IF needed for nausea/vomiting.      . pegfilgrastim (NEULASTA) 6 MG/0.6ML injection Inject 6 mg into the skin every 14 (fourteen) days.      . potassium chloride SA (K-DUR,KLOR-CON) 20 MEQ tablet Take 1 tablet (20 mEq total) by mouth daily.  30 tablet  3  . sucralfate (CARAFATE) 1 GM/10ML suspension Take 1 g by mouth 4 (four) times daily.      . [DISCONTINUED] HYDROcodone-acetaminophen (NORCO/VICODIN) 5-325 MG per tablet Take 1 tablet by mouth every 6 (six) hours as needed for pain.       Scheduled:  . coumadin book   Does not apply Once  . docusate sodium  100 mg Oral BID  . enoxaparin (LOVENOX) injection  40 mg Subcutaneous Q24H  . ferrous  sulfate  325 mg Oral TID PC  . filgrastim  300 mcg Subcutaneous Daily  . iron polysaccharides  150 mg Oral Daily  . pantoprazole  40 mg Oral Daily  . potassium chloride SA  20 mEq Oral Daily  . sucralfate  1 g Oral QID  . warfarin   Does not apply Once  . Warfarin - Pharmacist Dosing Inpatient   Does not apply q1800    Assessment: INR today is 1.22 in the 52 y.o female who slipped in store 6/5 -> rt supracondylar femur fx S/p ORIF 01/21/13 PM.   Lovenox 40 mg SQ q24h until INR =/>1.8 .  CrCl ~ 85 ml/min. No bleeding reported.  Hgb 9.4, pltc 228K.  INR 1.22 from baseline 1.07.   Goal of Therapy:  INR 2-3 Monitor platelets by anticoagulation protocol: Yes   Plan:  Coumadin 6 mg po today  INR daily  Coumadin education book and video ordered.  Noah Delaine, RPh Clinical Pharmacist Pager: [phone] 01/23/2013,3:29 PM

## 2013-01-24 LAB — PROTIME-INR: Prothrombin Time: 28.2 seconds — ABNORMAL HIGH (ref 11.6–15.2)

## 2013-01-24 NOTE — Progress Notes (Signed)
Patient doing well. No complaints  BP 139/85  Pulse 110  Temp(Src) 98.6 F (37 C) (Oral)  Resp 18  Ht 4' 11.84" (1.52 m)  Wt 92.1 kg (203 lb 0.7 oz)  BMI 39.86 kg/m2  SpO2 94%  Patient looks well. NVI  Knee immobilizer in place  INR 2.82  POD #3 after right IM nail  - up with PT/OT  - patient very motivated and interested in going home. This looks to be an appropriate plan. Will likely be able to d/c home tomorrow. - pain medications reviewed and appear appropriate

## 2013-01-24 NOTE — Progress Notes (Signed)
Physical Therapy Treatment Patient Details Name: Elizabeth Orr MRN: [id number] DOB: 11-Mar-1961 Today's Date: 01/24/2013 Time: [id number] PT Time Calculation (min): 25 min  PT Assessment / Plan / Recommendation Comments on Treatment Session  Pt is progressing with therapy. requires increased time for transfers due to SOB and pain in R LE. Pt and husband are considering HHPT vs. rehab. discussed benefits of both with pt. will cont to f/u with pt. Pt will need a wheelchair for mobility prior to D/c to next venue.    Follow Up Recommendations  Home health PT;Supervision/Assistance - 24 hour     Does the patient have the potential to tolerate intense rehabilitation     Barriers to Discharge        Equipment Recommendations  Wheelchair (measurements PT);Wheelchair cushion (measurements PT);Rolling walker with 5" wheels    Recommendations for Other Services    Frequency Min 4X/week   Plan Discharge plan remains appropriate;Frequency remains appropriate    Precautions / Restrictions Precautions Precautions: Fall Required Braces or Orthoses: Knee Immobilizer - Right Knee Immobilizer - Right: On at all times Restrictions Weight Bearing Restrictions: Yes RLE Weight Bearing: Non weight bearing   Pertinent Vitals/Pain 9/10' RN administered IV pain meds and oral meds prior to therapy session.    Mobility  Bed Mobility Bed Mobility: Supine to Sit Supine to Sit: 5: Supervision;HOB elevated;With rails Details for Bed Mobility Assistance: requires increased time due to SOB and pain in R LE; pt requires hand rails  Transfers Transfers: Sit to Stand;Stand to Sit Sit to Stand: 3: Mod assist;From bed;With upper extremity assist Stand to Sit: 4: Min assist;To chair/3-in-1;With armrests Details for Transfer Assistance: verbal cues for hand placement and safety with RW; pt tends to pull up to stand with RW and demo decreased safety awareness with sitting; pt requires increased (A) to transition to  stand from lower surfaces; requires elevated bed Ambulation/Gait Ambulation/Gait Assistance: 3: Mod assist Ambulation Distance (Feet): 10 Feet Assistive device: Rolling walker Ambulation/Gait Assistance Details: required verbal cues for safety with RW and to maintain NWB status on R LE; requires cues to focus on safe amb vs increased speed  Gait Pattern:  (hop to ) Gait velocity: decreased; due to NWB status on R LE and increased pain General Gait Details: pt c/o SOB after amb on RA; returned to 3L O2; pt states this has been going on for a month  Stairs: No Wheelchair Mobility Wheelchair Mobility: No    Exercises General Exercises - Lower Extremity Ankle Circles/Pumps: AROM;Both;15 reps;Seated;Supine   PT Diagnosis:    PT Problem List:   PT Treatment Interventions:     PT Goals Acute Rehab PT Goals PT Goal Formulation: With patient/family Time For Goal Achievement: 02/05/13 Potential to Achieve Goals: Good PT Goal: Supine/Side to Sit - Progress: Met PT Goal: Sit to Stand - Progress: Progressing toward goal PT Goal: Stand to Sit - Progress: Progressing toward goal PT Transfer Goal: Bed to Chair/Chair to Bed - Progress: Progressing toward goal PT Goal: Ambulate - Progress: Progressing toward goal  Visit Information  Last PT Received On: 01/24/13 Assistance Needed: +1    Subjective Data  Subjective: pt lying supine; RN and husband present; agreeable to therapy  Patient Stated Goal: home   Cognition  Cognition Arousal/Alertness: Awake/alert Behavior During Therapy: WFL for tasks assessed/performed Overall Cognitive Status: Within Functional Limits for tasks assessed    Balance  Balance Balance Assessed: Yes Static Sitting Balance Static Sitting - Balance Support: No upper extremity supported (  L LE supported ) Static Sitting - Level of Assistance: 5: Stand by assistance Static Sitting - Comment/# of Minutes: tolerated sitting EOB ~5 min to "clear head" pt requires  increased time for transitions due to SOB and pain with activity.  End of Session PT - End of Session Equipment Utilized During Treatment: Gait belt;Right knee immobilizer;Oxygen Activity Tolerance: Other (comment) (SOB) Patient left: in chair;with call bell/phone within reach;with family/visitor present Nurse Communication: Mobility status   GP     Donell Sievert,  [phone] 01/24/2013, 8:47 AM

## 2013-01-24 NOTE — Clinical Social Work Psychosocial (Signed)
Clinical Social Work Department BRIEF PSYCHOSOCIAL ASSESSMENT 01/24/2013  Patient:  Elizabeth Orr, Elizabeth Orr     Account Number:  [id number]     Admit date:  01/21/2013  Clinical Social Worker:  Demetrios Loll  Date/Time:  01/24/2013 05:31 PM  Referred by:  Physician  Date Referred:  01/24/2013 Referred for  SNF Placement   Other Referral:   Interview type:  Patient Other interview type:    PSYCHOSOCIAL DATA Living Status:  HUSBAND Admitted from facility:   Level of care:   Primary support name:  Sherley Bounds (mother) Primary support relationship to patient:  PARENT Degree of support available:   pt has a very large and supportive family    CURRENT CONCERNS Current Concerns  None Noted   Other Concerns:    SOCIAL WORK ASSESSMENT / PLAN CSW received consult for SNF. CSW met with pt and spouse to address consult. PT is recommending HHPT. CSW introduced herself and explained role of social work. Pt's spouse shared that pt has a supportive family who are all able to help and assist with pt's care. Pt declined SNF. CSW will refer pt to [facility]. CSW is signing off as no further needs identifed. Please reconsult if a needed arises prior to discharge.   Assessment/plan status:  No Further Intervention Required Other assessment/ plan:   Information/referral to community resources:   none    PATIENT'S/FAMILY'S RESPONSE TO PLAN OF CARE: Pt and husband were very pleasant and agreeable to discharge plan.   Dede Query, MSW, LCSW Clinical Social Work  Weekend Coverage

## 2013-01-24 NOTE — Progress Notes (Signed)
ANTICOAGULATION CONSULT NOTE - Follow up Pharmacy Consult for Coumadin Indication: VTE prophylaxis  Allergies  Allergen Reactions  . Citalopram     Makes her feel drowsy/sleepy.  . Effexor (Venlafaxine Hcl)     Makes pt feel very bad,will not take again.  . Motrin (Ibuprofen) Swelling    Lips swell  . Trazodone And Nefazodone     Makes pt feel very bad and "hung over"    Patient Measurements: Height: 4' 11.84" (152 cm) (taken from 12/08/12 report) Weight: 203 lb 0.7 oz (92.1 kg) (from 01/13/13) IBW/kg (Calculated) : 45.14   Vital Signs: Temp: 98.6 F (37 C) (06/08 0541) Temp src: Oral (06/08 0541) BP: 139/85 mmHg (06/08 0541) Pulse Rate: 110 (06/08 0541)  Labs:  Recent Labs  01/21/13 1543 01/22/13 0640 01/22/13 1250 01/23/13 0530 01/24/13 0505  HGB 11.1* 9.5*  --  9.4*  --   HCT 35.1* 31.2*  --  30.6*  --   PLT 347 267  --  228  --   LABPROT  --   --  13.8 15.2 28.2*  INR  --   --  1.07 1.22 2.82*  CREATININE 0.49* 0.46*  --  0.48*  --     Estimated Creatinine Clearance: 84.9 ml/min (by C-G formula based on Cr of 0.48).   Medical History: Past Medical History  Diagnosis Date  . Kidney stones 1989  . GERD (gastroesophageal reflux disease)   . Arthritis   . History of stomach ulcers 2003/2007    Hx of esophageal ulcers and stomach ulcers due to reflux  . Back pain   . Chronic leg pain   . Polysubstance abuse   . Depression   . Anxiety   . Exertional shortness of breath   . History of blood transfusion     "recently had my 2nd; related to chemo" (01/21/2013)  . ZOXWRUEA(540.9)     "monthly" (01/21/2013)  . Breast cancer 06/08/12    right mastectomy  . Infiltrating ductal carcinoma of breast 07/09/2012    Neijstrom:  chemo    Medications:  Prescriptions prior to admission  Medication Sig Dispense Refill  . albuterol (PROVENTIL HFA;VENTOLIN HFA) 108 (90 BASE) MCG/ACT inhaler Inhale 2 puffs into the lungs every 6 (six) hours as needed for wheezing.  1  Inhaler  2  . Alum & Mag Hydroxide-Simeth (MAGIC MOUTHWASH) SOLN Take 5 mLs by mouth 4 (four) times daily as needed.       . chlorpheniramine-HYDROcodone (TUSSIONEX) 10-8 MG/5ML LQCR Take 5 mLs by mouth at bedtime as needed. 60ml no refills      . dexamethasone (DECADRON) 4 MG tablet Take 2 tablets @ 9pm PM before chemo then 2 tablets @ 3am the AM of chemo. Then take 2 tabs in AM and 2 tabs in PM for 1 day after chemo.  30 tablet  0  . hydrOXYzine (VISTARIL) 50 MG capsule Take 50 mg by mouth at bedtime as needed for itching. Take two tablets at bedtime as needed.      . iron polysaccharides (NIFEREX) 150 MG capsule Take 150 mg by mouth daily.      Marland Kitchen lidocaine-prilocaine (EMLA) cream Apply a quarter sized amount to port site 1 hour prior to chemo. Do not rub in.  Cover with plastic.      Marland Kitchen LORazepam (ATIVAN) 1 MG tablet Take 1 mg by mouth every 4 (four) hours as needed. Take 1 tablet every 4 hours IF needed for nausea/vomiting.      Marland Kitchen  magnesium hydroxide (MILK OF MAGNESIA) 400 MG/5ML suspension Take 30 mLs by mouth daily as needed for constipation.      Marland Kitchen omeprazole (PRILOSEC) 20 MG capsule Take 1 capsule (20 mg total) by mouth daily.  30 capsule  2  . ondansetron (ZOFRAN) 8 MG tablet Take 8 mg by mouth. Starting the day after chemo, take 1 tablet in the am and 1 tablet in the pm for 2 days. Then may take 1 tablet two times a day IF needed for nausea/vomiting.      . pegfilgrastim (NEULASTA) 6 MG/0.6ML injection Inject 6 mg into the skin every 14 (fourteen) days.      . potassium chloride SA (K-DUR,KLOR-CON) 20 MEQ tablet Take 1 tablet (20 mEq total) by mouth daily.  30 tablet  3  . sucralfate (CARAFATE) 1 GM/10ML suspension Take 1 g by mouth 4 (four) times daily.      . [DISCONTINUED] HYDROcodone-acetaminophen (NORCO/VICODIN) 5-325 MG per tablet Take 1 tablet by mouth every 6 (six) hours as needed for pain.       Scheduled:  . docusate sodium  100 mg Oral BID  . enoxaparin (LOVENOX) injection  40  mg Subcutaneous Q24H  . ferrous sulfate  325 mg Oral TID PC  . filgrastim  300 mcg Subcutaneous Daily  . iron polysaccharides  150 mg Oral Daily  . pantoprazole  40 mg Oral Daily  . potassium chloride SA  20 mEq Oral Daily  . sucralfate  1 g Oral QID  . Warfarin - Pharmacist Dosing Inpatient   Does not apply q1800    Assessment: Therapeutic INR today. INR  has increased significantlyl to 2.82 in this  52 y.o female on coumadin for VTE prophylaxis s/p ORIF rt supracondylar femur fx after she slipped in store on 6/514/ . Will  DC the  Lovenox today.   CrCl ~ 85 ml/min. No bleeding reported.  Last CBC done 6/7, Hgb = 9.4, pltc 228K.  If discharged home tomorrow it will be difficult to determine coumadin dose due to significant rise in INR.   I would expect she only needs low dose, maybe 2mg  daily , but may need to hold dose again tomorrow 6/8 if INR increases more. Also will need early and close INR follow-up per home health nurse/pharmacist. Recommend 1st outpatient INR check on Tues 6/10 if discharged home tomorrow and again of this week per protocol.   Goal of Therapy:  INR 2-3 Monitor platelets by anticoagulation protocol: Yes   Plan:  No coumadin today.  INR daily    Noah Delaine, RPh Clinical Pharmacist Pager: [phone] 01/24/2013,11:58 AM

## 2013-01-25 LAB — PROTIME-INR: INR: 2.82 — ABNORMAL HIGH (ref 0.00–1.49)

## 2013-01-25 MED ORDER — OXYCODONE-ACETAMINOPHEN 5-325 MG PO TABS: 1.0000 | ORAL_TABLET | ORAL | Status: DC | PRN

## 2013-01-25 MED ORDER — WARFARIN SODIUM 2 MG PO TABS: ORAL_TABLET | ORAL | Status: DC

## 2013-01-25 MED ORDER — WARFARIN SODIUM 5 MG PO TABS
5.0000 mg | ORAL_TABLET | Freq: Once | ORAL | Status: DC
  Filled 2013-01-25: qty 1

## 2013-01-25 MED ORDER — WARFARIN SODIUM 5 MG PO TABS
5.0000 mg | ORAL_TABLET | Freq: Once | ORAL | Status: AC
  Administered 2013-01-25: 5 mg via ORAL
  Filled 2013-01-25: qty 1

## 2013-01-25 MED FILL — Hydromorphone HCl Inj 1 MG/ML: INTRAMUSCULAR | Qty: 1 | Status: AC

## 2013-01-25 NOTE — Progress Notes (Signed)
01/25/13 1342  PT Visit Information  Last PT Received On 01/25/13  Assistance Needed +1  PT/OT Co-Evaluation/Treatment Yes  PT Time Calculation  PT Start Time 1312  PT Stop Time 1325  PT Time Calculation (min) 13 min  Subjective Data  Subjective pt sitting on EOB with OT present; agreeable to therapy   Patient Stated Goal home tomorrow   Precautions  Precautions Fall  Required Braces or Orthoses Knee Immobilizer - Right  Knee Immobilizer - Right On at all times  Restrictions  Weight Bearing Restrictions Yes  RLE Weight Bearing NWB  Cognition  Arousal/Alertness Awake/alert  Behavior During Therapy WFL for tasks assessed/performed  Overall Cognitive Status Within Functional Limits for tasks assessed  Bed Mobility  Bed Mobility Sit to Supine;Scooting to HOB  Sit to Supine 4: Min guard;HOB flat  Scooting to [facility] 5: Supervision  Details for Bed Mobility Assistance (A) to guide R LE up to bed and control R LE; pt can initate movement of R LE but unable to indepdently control R LE onto bed  Transfers  Transfers Sit to Stand;Stand to Sit  Sit to Stand 4: Min guard;From bed;From chair/3-in-1;From toilet  Stand to Sit To chair/3-in-1;With armrests;4: Min guard;To bed  Details for Transfer Assistance min verbal cues for safety and hand placement with RW   Ambulation/Gait  Ambulation/Gait Assistance 4: Min guard  Ambulation Distance (Feet) 12 Feet (x2)  Assistive device Rolling walker  Ambulation/Gait Assistance Details min cues to maintain NWB status on R LE; demo increased indepdence and ability to maintain NWB status today; pt can be impulsive and requires redirection to maintain safe amb speed  Gait Pattern (hop to)  Gait velocity decreased; due to NWB status on R LE and increased pain  General Gait Details pt c/o SOB after amb; requested to be returned to O2  Stairs No  Wheelchair Mobility  Wheelchair Mobility No  Balance  Balance Assessed No  PT - End of Session  Equipment  Utilized During Treatment Gait belt;Right knee immobilizer;Oxygen  Activity Tolerance Patient tolerated treatment well  Patient left in bed;with call bell/phone within reach;with family/visitor present  Nurse Communication Mobility status  PT - Assessment/Plan  Comments on Treatment Session Pt progressing well with therapy. Able to increase amb distance today and decrease amount of (A) needed. Anticipate D/C home tomorrow with 24/7 family (A) and HHPT.   PT Plan Discharge plan remains appropriate;Frequency remains appropriate  PT Frequency Min 4X/week  Follow Up Recommendations Home health PT;Supervision/Assistance - 24 hour  PT equipment Wheelchair (measurements PT);Wheelchair cushion (measurements PT);Rolling walker with 5" wheels  Acute Rehab PT Goals  PT Goal Formulation With patient/family  Time For Goal Achievement 02/05/13  Potential to Achieve Goals Good  PT Goal: Sit to Supine/Side - Progress Progressing toward goal  PT Goal: Sit to Stand - Progress Progressing toward goal  PT Goal: Stand to Sit - Progress Progressing toward goal  PT Goal: Ambulate - Progress Progressing toward goal  PT General Charges  $$ ACUTE PT VISIT 1 Procedure  PT Treatments  $Gait Training 8-22 mins  Pt given IV morphine by RN during session; reports pain 10/10  [place], [place], [place] [phone]

## 2013-01-25 NOTE — Care Management Note (Signed)
CARE MANAGEMENT NOTE 01/25/2013  Patient:  Elizabeth Orr, Elizabeth Orr   Account Number:  [id number]  Date Initiated:  01/22/2013  Documentation initiated by:  Vance Peper  Subjective/Objective Assessment:   52 yr old female s/p right femur IM nailing.     Action/Plan:   CM spoke with patient concerning home health and DME needs at discharge. Patient is waiting to see if inpatient rehab can accept her. Discussed plan for home as well.   Anticipated DC Date:  01/26/2013   Anticipated DC Plan:  HOME W HOME HEALTH SERVICES      DC Planning Services  CM consult      [facility] Choice  HOME HEALTH  DURABLE MEDICAL EQUIPMENT   Choice offered to / List presented to:  C-1 Patient   DME arranged  WALKER - ROLLING  WHEELCHAIR - MANUAL      DME agency  [facility]     HH arranged  HH-2 PT      [facility] agency  [facility]   Status of service:  Completed, signed off Medicare Important Message given?   (If response is "NO", the following Medicare IM given date fields will be blank) Date Medicare IM given:   Date Additional Medicare IM given:    Discharge Disposition:  HOME W HOME HEALTH SERVICES  Per UR Regulation:    If discussed at Long Length of Stay Meetings, dates discussed:    Comments:

## 2013-01-25 NOTE — Progress Notes (Signed)
Subjective: 4 Days Post-Op Procedure(s) (LRB): INTRAMEDULLARY (IM) RETROGRADE FEMORAL NAILING (Right)  Activity level:  nwb right leg Diet tolerance:  ok Voiding:  ok Patient reports pain as 3 on 0-10 scale.    Objective: Vital signs in last 24 hours: Temp:  [98.4 F (36.9 C)-99.3 F (37.4 C)] 99.3 F (37.4 C) (06/09 0511) Pulse Rate:  [104-114] 104 (06/09 0511) Resp:  [16-18] 16 (06/09 0511) BP: (120-146)/(68-84) 146/84 mmHg (06/09 0511) SpO2:  [96 %-99 %] 99 % (06/09 0511)  Labs:  Recent Labs  01/23/13 0530  HGB 9.4*    Recent Labs  01/23/13 0530  WBC 12.7*  RBC 3.27*  HCT 30.6*  PLT 228    Recent Labs  01/23/13 0530  NA 136  K 3.4*  CL 100  CO2 25  BUN 3*  CREATININE 0.48*  GLUCOSE 104*  CALCIUM 8.7    Recent Labs  01/24/13 0505 01/25/13 0450  INR 2.82* 2.82*    Physical Exam:  Neurologically intact ABD soft Neurovascular intact Sensation intact distally Intact pulses distally Dorsiflexion/Plantar flexion intact No cellulitis present Compartment soft  Assessment/Plan:  4 Days Post-Op Procedure(s) (LRB): INTRAMEDULLARY (IM) RETROGRADE FEMORAL NAILING (Right) Advance diet Up with therapy D/C IV fluids Plan for discharge tomorrow Discharge home with home health tues  Coumadin x 4 weeks nwb  right  New dressing    Elizabeth Orr R 01/25/2013, 9:34 AM

## 2013-01-25 NOTE — Progress Notes (Signed)
Occupational Therapy Treatment Patient Details Name: DEMETRA MOYA MRN: [id number] DOB: [birth year] Today's Date: 01/25/2013 Time: 4098-1191 OT Time Calculation (min): 24 min  OT Assessment / Plan / Recommendation Comments on Treatment Session Pt making progress and should continue with acute OT services. OT/PT encouraged pt to sit up in recliner, however pt declined due to R LE pain (just had meds) and went back to bed at end of tx session. Pt's nurse tech stated that she has been in bed all day    Follow Up Recommendations  Home health OT;Supervision/Assistance - 24 hour    Barriers to Discharge   None    Equipment Recommendations  3 in 1 bedside commode ;shower chair   Recommendations for Other Services    Frequency Min 2X/week   Plan Discharge plan needs to be updated    Precautions / Restrictions  NWB R LE   Pertinent Vitals/Pain 9-10/10 R LE pain    ADL  Grooming: Performed;Wash/dry hands;Wash/dry face;Min guard Where Assessed - Grooming: Supported standing Lower Body Dressing: Performed;Maximal assistance Where Assessed - Lower Body Dressing: Unsupported sitting Toilet Transfer: Performed;Min guard Toilet Transfer Method: Sit to Barista: Raised toilet seat with arms (or 3-in-1 over toilet);Grab bars Toileting - Clothing Manipulation and Hygiene: Performed;Min guard Where Assessed - Engineer, mining and Hygiene: Standing Tub/Shower Transfer: Performed;Min guard Web designer Method: Science writer: Shower seat with back;Grab bars Equipment Used: Knee Immobilizer;Rolling walker;Gait belt Transfers/Ambulation Related to ADLs: Cues for correct hand placement and for speed of pace ADL Comments: Practiced shower transfer with shower chair. Pt and her husband state that she will be using the walk in shower and NOT the tub shower at home    OT Diagnosis:    OT Problem List:   OT Treatment Interventions:      OT Goals ADL Goals ADL Goal: Lower Body Dressing - Progress: Not progressing ADL Goal: Toileting - Clothing Manipulation - Progress: Progressing toward goals ADL Goal: Toileting - Hygiene - Progress: Progressing toward goals ADL Goal: Tub/Shower Transfer - Progress: Progressing toward goals  Visit Information  Last OT Received On: 01/25/13 Assistance Needed: +1 PT/OT Co-Evaluation/Treatment: Yes Reason Eval/Treat Not Completed: Pain limiting ability to participate    Subjective Data  Subjective: " I am doing better than yesterday ' Patient Stated Goal: To return home   Prior Functioning       Cognition  Cognition Arousal/Alertness: Awake/alert Behavior During Therapy: WFL for tasks assessed/performed Overall Cognitive Status: Within Functional Limits for tasks assessed    Mobility  Bed Mobility Bed Mobility: Supine to Sit;Sitting - Scoot to [place] of Bed;Sit to Supine;Scooting to [facility] Sitting - Scoot to [place] of Bed: 5: Supervision Sit to Supine: 5: Supervision Scooting to [facility]: 5: Supervision Details for Bed Mobility Assistance: requires increased time due to SOB and pain in R LE; pt requires hand rails  Transfers Transfers: Sit to Stand;Stand to Sit Sit to Stand: 4: Min guard;From bed;From chair/3-in-1;From toilet Stand to Sit: To bed;To chair/3-in-1;To toilet;4: Min guard Details for Transfer Assistance: verbal cues for hand placement and safety and speed with RW    Exercises      Balance Balance Balance Assessed: No   End of Session OT - End of Session Equipment Utilized During Treatment: Gait belt;Right knee immobilizer;Other (comment) (RW, shower chair) Activity Tolerance: Patient limited by pain Patient left: in bed;with call bell/phone within reach;with family/visitor present  GO     Galen Manila 01/25/2013, 1:51 PM

## 2013-01-25 NOTE — Progress Notes (Addendum)
ANTICOAGULATION CONSULT NOTE - Follow up Pharmacy Consult for Coumadin Indication: VTE prophylaxis  Allergies  Allergen Reactions  . Citalopram     Makes her feel drowsy/sleepy.  . Effexor (Venlafaxine Hcl)     Makes pt feel very bad,will not take again.  . Motrin (Ibuprofen) Swelling    Lips swell  . Trazodone And Nefazodone     Makes pt feel very bad and "hung over"   Patient Measurements: Height: 4' 11.84" (152 cm) (taken from 12/08/12 report) Weight: 203 lb 0.7 oz (92.1 kg) (from 01/13/13) IBW/kg (Calculated) : 45.14  Vital Signs: Temp: 99.3 F (37.4 C) (06/09 0511) BP: 146/84 mmHg (06/09 0511) Pulse Rate: 104 (06/09 0511)  Labs:  Recent Labs  01/23/13 0530 01/24/13 0505 01/25/13 0450  HGB 9.4*  --   --   HCT 30.6*  --   --   PLT 228  --   --   LABPROT 15.2 28.2* 28.2*  INR 1.22 2.82* 2.82*  CREATININE 0.48*  --   --    Estimated Creatinine Clearance: 84.9 ml/min (by C-G formula based on Cr of 0.48).  Medical History: Past Medical History  Diagnosis Date  . Kidney stones 1989  . GERD (gastroesophageal reflux disease)   . Arthritis   . History of stomach ulcers 2003/2007    Hx of esophageal ulcers and stomach ulcers due to reflux  . Back pain   . Chronic leg pain   . Polysubstance abuse   . Depression   . Anxiety   . Exertional shortness of breath   . History of blood transfusion     "recently had my 2nd; related to chemo" (01/21/2013)  . WUJWJXBJ(478.2)     "monthly" (01/21/2013)  . Breast cancer 06/08/12    right mastectomy  . Infiltrating ductal carcinoma of breast 07/09/2012    Neijstrom:  chemo   Medications:  Prescriptions prior to admission  Medication Sig Dispense Refill  . albuterol (PROVENTIL HFA;VENTOLIN HFA) 108 (90 BASE) MCG/ACT inhaler Inhale 2 puffs into the lungs every 6 (six) hours as needed for wheezing.  1 Inhaler  2  . Alum & Mag Hydroxide-Simeth (MAGIC MOUTHWASH) SOLN Take 5 mLs by mouth 4 (four) times daily as needed.       .  chlorpheniramine-HYDROcodone (TUSSIONEX) 10-8 MG/5ML LQCR Take 5 mLs by mouth at bedtime as needed. 60ml no refills      . dexamethasone (DECADRON) 4 MG tablet Take 2 tablets @ 9pm PM before chemo then 2 tablets @ 3am the AM of chemo. Then take 2 tabs in AM and 2 tabs in PM for 1 day after chemo.  30 tablet  0  . hydrOXYzine (VISTARIL) 50 MG capsule Take 50 mg by mouth at bedtime as needed for itching. Take two tablets at bedtime as needed.      . iron polysaccharides (NIFEREX) 150 MG capsule Take 150 mg by mouth daily.      Marland Kitchen lidocaine-prilocaine (EMLA) cream Apply a quarter sized amount to port site 1 hour prior to chemo. Do not rub in.  Cover with plastic.      Marland Kitchen LORazepam (ATIVAN) 1 MG tablet Take 1 mg by mouth every 4 (four) hours as needed. Take 1 tablet every 4 hours IF needed for nausea/vomiting.      . magnesium hydroxide (MILK OF MAGNESIA) 400 MG/5ML suspension Take 30 mLs by mouth daily as needed for constipation.      Marland Kitchen omeprazole (PRILOSEC) 20 MG capsule Take 1 capsule (20  mg total) by mouth daily.  30 capsule  2  . ondansetron (ZOFRAN) 8 MG tablet Take 8 mg by mouth. Starting the day after chemo, take 1 tablet in the am and 1 tablet in the pm for 2 days. Then may take 1 tablet two times a day IF needed for nausea/vomiting.      . pegfilgrastim (NEULASTA) 6 MG/0.6ML injection Inject 6 mg into the skin every 14 (fourteen) days.      . potassium chloride SA (K-DUR,KLOR-CON) 20 MEQ tablet Take 1 tablet (20 mEq total) by mouth daily.  30 tablet  3  . sucralfate (CARAFATE) 1 GM/10ML suspension Take 1 g by mouth 4 (four) times daily.      . [DISCONTINUED] HYDROcodone-acetaminophen (NORCO/VICODIN) 5-325 MG per tablet Take 1 tablet by mouth every 6 (six) hours as needed for pain.       Scheduled:  . docusate sodium  100 mg Oral BID  . ferrous sulfate  325 mg Oral TID PC  . filgrastim  300 mcg Subcutaneous Daily  . iron polysaccharides  150 mg Oral Daily  . pantoprazole  40 mg Oral Daily  .  potassium chloride SA  20 mEq Oral Daily  . sucralfate  1 g Oral QID  . warfarin  5 mg Oral ONCE-1800  . Warfarin - Pharmacist Dosing Inpatient   Does not apply q1800   Assessment: Therapeutic INR today. INR  has increased significantlyl to 2.82 in this  52 y.o female on coumadin for VTE prophylaxis s/p ORIF rt supracondylar femur fx after she slipped in store on 6/514/ .  No bleeding reported.  Last CBC done 6/7, Hgb = 9.4, pltc 228K.  Noted plans to discharge in the morning.  We will provide education regarding Warfarin therapy.  Would try a discharge dose of 5 mg daily to start and adjust based on her levels.  Goal of Therapy:  INR 2-3 Monitor platelets by anticoagulation protocol: Yes   Plan:  Warfarin 5mg  x 1 today INR daily   Nadara Mustard, PharmD., MS Clinical Pharmacist Pager:  [phone] Thank you for allowing pharmacy to be part of this patients care team. 01/25/2013,11:14 AM  Addendum: Spoke with the patient this afternoon and discussed her Warfarin therapy.  She asked appropriate questions and verbalized understanding.  I explained bleeding complications, drug/drug interactions and dosing.  Provided with Warfarin teaching handout as well.  Nadara Mustard, PharmD. [phone]

## 2013-01-25 NOTE — Discharge Summary (Addendum)
Patient ID: Elizabeth Orr MRN: [id number] DOB/AGE: 04/18/1961 52 y.o.  Admit date: 01/21/2013 Discharge date: 01/26/2013  Admission Diagnoses:  Closed right distal femur fracture  Discharge Diagnoses:  Same  Past Medical History  Diagnosis Date  . Kidney stones 1989  . GERD (gastroesophageal reflux disease)   . Arthritis   . History of stomach ulcers 2003/2007    Hx of esophageal ulcers and stomach ulcers due to reflux  . Back pain   . Chronic leg pain   . Polysubstance abuse   . Depression   . Anxiety   . Exertional shortness of breath   . History of blood transfusion     "recently had my 2nd; related to chemo" (01/21/2013)  . NWGNFAOZ(308.6)     "monthly" (01/21/2013)  . Breast cancer 06/08/12    right mastectomy  . Infiltrating ductal carcinoma of breast 07/09/2012    Neijstrom:  chemo    Surgeries: Procedure(s): INTRAMEDULLARY (IM) RETROGRADE FEMORAL NAILING on 01/21/2013 - 01/22/2013   Consultants:    Discharged Condition: Improved  Hospital Course: DOHA BOLING is an 52 y.o. female who was admitted 01/21/2013 for operative treatment of a closed right distal femur fracture.. Patient has severe unremitting pain that affects sleep, daily activities, and work/hobbies. After pre-op clearance the patient was taken to the operating room on 01/21/2013 - 01/22/2013 and underwent  Procedure(s): INTRAMEDULLARY (IM) RETROGRADE FEMORAL NAILING.    Patient was given perioperative antibiotics: Anti-infectives   Start     Dose/Rate Route Frequency Ordered Stop   01/22/13 0130  azithromycin (ZITHROMAX) tablet 250 mg     250 mg Oral  Once 01/22/13 0125 01/22/13 0205   01/22/13 0130  ceFAZolin (ANCEF) IVPB 2 g/50 mL premix     2 g 100 mL/hr over 30 Minutes Intravenous Every 6 hours 01/22/13 0125 01/22/13 0834       Patient was given sequential compression devices, early ambulation, and chemoprophylaxis to prevent DVT.  Patient benefited maximally from hospital stay and there were no  complications.    Recent vital signs: Patient Vitals for the past 24 hrs:  BP Temp Pulse Resp SpO2  01/25/13 0511 146/84 mmHg 99.3 F (37.4 C) 104 16 99 %  01/24/13 2157 120/68 mmHg 98.4 F (36.9 C) 104 16 98 %  01/24/13 1600 - - - 16 96 %  01/24/13 1516 133/82 mmHg 98.6 F (37 C) 114 18 96 %  01/24/13 1218 - - - 18 97 %     Recent laboratory studies:  Recent Labs  01/23/13 0530 01/24/13 0505 01/25/13 0450  WBC 12.7*  --   --   HGB 9.4*  --   --   HCT 30.6*  --   --   PLT 228  --   --   NA 136  --   --   K 3.4*  --   --   CL 100  --   --   CO2 25  --   --   BUN 3*  --   --   CREATININE 0.48*  --   --   GLUCOSE 104*  --   --   INR 1.22 2.82* 2.82*  CALCIUM 8.7  --   --      Discharge Medications:     Medication List    STOP taking these medications       HYDROcodone-acetaminophen 5-325 MG per tablet  Commonly known as:  NORCO/VICODIN      TAKE these medications  albuterol 108 (90 BASE) MCG/ACT inhaler  Commonly known as:  PROVENTIL HFA;VENTOLIN HFA  Inhale 2 puffs into the lungs every 6 (six) hours as needed for wheezing.     chlorpheniramine-HYDROcodone 10-8 MG/5ML Lqcr  Commonly known as:  TUSSIONEX  Take 5 mLs by mouth at bedtime as needed. 60ml no refills     dexamethasone 4 MG tablet  Commonly known as:  DECADRON  Take 2 tablets @ 9pm PM before chemo then 2 tablets @ 3am the AM of chemo. Then take 2 tabs in AM and 2 tabs in PM for 1 day after chemo.     hydrOXYzine 50 MG capsule  Commonly known as:  VISTARIL  Take 50 mg by mouth at bedtime as needed for itching. Take two tablets at bedtime as needed.     iron polysaccharides 150 MG capsule  Commonly known as:  NIFEREX  Take 150 mg by mouth daily.     lidocaine-prilocaine cream  Commonly known as:  EMLA  Apply a quarter sized amount to port site 1 hour prior to chemo. Do not rub in.  Cover with plastic.     LORazepam 1 MG tablet  Commonly known as:  ATIVAN  Take 1 mg by mouth every 4  (four) hours as needed. Take 1 tablet every 4 hours IF needed for nausea/vomiting.     magic mouthwash Soln  Take 5 mLs by mouth 4 (four) times daily as needed.     magnesium hydroxide 400 MG/5ML suspension  Commonly known as:  MILK OF MAGNESIA  Take 30 mLs by mouth daily as needed for constipation.     NEULASTA 6 MG/0.6ML injection  Generic drug:  pegfilgrastim  Inject 6 mg into the skin every 14 (fourteen) days.     omeprazole 20 MG capsule  Commonly known as:  PRILOSEC  Take 1 capsule (20 mg total) by mouth daily.     ondansetron 8 MG tablet  Commonly known as:  ZOFRAN  Take 8 mg by mouth. Starting the day after chemo, take 1 tablet in the am and 1 tablet in the pm for 2 days. Then may take 1 tablet two times a day IF needed for nausea/vomiting.     oxyCODONE-acetaminophen 5-325 MG per tablet  Commonly known as:  PERCOCET/ROXICET  Take 1-2 tablets by mouth every 4 (four) hours as needed.     potassium chloride SA 20 MEQ tablet  Commonly known as:  K-DUR,KLOR-CON  Take 1 tablet (20 mEq total) by mouth daily.     sucralfate 1 GM/10ML suspension  Commonly known as:  CARAFATE  Take 1 g by mouth 4 (four) times daily.     warfarin 2 MG tablet  Commonly known as:  COUMADIN  Take as directed for 4 weeks        Diagnostic Studies: Dg Chest 1 View  01/21/2013   *RADIOLOGY REPORT*  Clinical Data: Knee injury.  Fall.Breast cancer.  Right mastectomy. Chest pain.  CHEST - 1 VIEW  Comparison: 01/13/2013.  Findings: No pneumothorax.  Cardiopericardial silhouette within normal limits. Mediastinal contours normal. Trachea midline.  No airspace disease or effusion.  Left subclavian power port is present with the tip in the mid SVC.  Right axillary dissection clips are present.  IMPRESSION: No acute cardiopulmonary disease.  Postoperative and postprocedural changes of the chest.   Original Report Authenticated By: Andreas Newport, M.D.   Dg Chest 2 View  01/13/2013   *RADIOLOGY REPORT*   Clinical Data: Wheezing, cough, breast  cancer.  Former smoker.  CHEST - 2 VIEW  Comparison: 12/14/2012.  Findings: Trachea is midline.  Heart size stable.  Left subclavian power port tip projects over the SVC.  Lungs are clear.  No pleural fluid.  Surgical clips are seen in the right axillary region.  IMPRESSION: No acute findings.   Original Report Authenticated By: Leanna Battles, M.D.   Dg Femur Right  01/21/2013   *RADIOLOGY REPORT*  Clinical Data: Right femur fracture ORIF  DG C-ARM 1-60 MIN,RIGHT FEMUR - 2 VIEW  Comparison: Preoperative imaging 01/21/2013  Findings: There has been intramedullary rod and screw fixation of the previously seen comminuted distal femoral fracture.  Fracture fragments are in near anatomic alignment.  No evidence for hardware failure.  The proximal portion of the fixation component is not completely imaged.  IMPRESSION: Intraoperative findings as above.   Original Report Authenticated By: Christiana Pellant, M.D.   Dg Knee 1-2 Views Right  01/21/2013   *RADIOLOGY REPORT*  Clinical Data: Pain post trauma  RIGHT KNEE - 1-2 VIEW  Comparison: None.  Findings: Frontal and lateral views were obtained.  There is a comminuted fracture of the distal femoral metaphysis with posterior displacement of the distal major fracture fragments with respect proximal fragment.  There is approximately 4 cm of overriding.  The femoral shaft is located immediately superior to the patella.  There is no gross dislocation.  IMPRESSION: Comminuted displaced fracture of the distal femoral metaphysis as described.   Original Report Authenticated By: Bretta Bang, M.D.   Dg C-arm 1-60 Min  01/21/2013   *RADIOLOGY REPORT*  Clinical Data: Right femur fracture ORIF  DG C-ARM 1-60 MIN,RIGHT FEMUR - 2 VIEW  Comparison: Preoperative imaging 01/21/2013  Findings: There has been intramedullary rod and screw fixation of the previously seen comminuted distal femoral fracture.  Fracture fragments are in near anatomic  alignment.  No evidence for hardware failure.  The proximal portion of the fixation component is not completely imaged.  IMPRESSION: Intraoperative findings as above.   Original Report Authenticated By: Christiana Pellant, M.D.    Disposition: 01-Home or Self Care      Discharge Orders   Future Appointments Provider Department Dept Phone   01/27/2013 10:15 AM [facility] [phone]   02/01/2013 9:45 AM Tilda Burrow, MD [facility] [phone]   02/17/2013 9:00 AM Randall An, MD [facility] [phone]   Future Orders Complete By Expires     Call MD / Call 911  As directed     Comments:      If you experience chest pain or shortness of breath, CALL 911 and be transported to the hospital emergency room.  If you develope a fever above 101 F, pus (white drainage) or increased drainage or redness at the wound, or calf pain, call your surgeon's office.    Constipation Prevention  As directed     Comments:      Drink plenty of fluids.  Prune juice may be helpful.  You may use a stool softener, such as Colace (over the counter) 100 mg twice a day.  Use MiraLax (over the counter) for constipation as needed.    Diet - low sodium heart healthy  As directed     Increase activity slowly as tolerated  As directed        Follow-up Information   Follow up with DALLDORF,PETER G, MD. Call in 10 days.   Contact information:   [address] [place] [address] [phone]      Coumadin to be managed closely by home health agency. Next INR 1-2 days after discharge. Patient is to keep knee brace on and no weight on right leg.   Signed: Amaro Mangold R 01/25/2013, 9:40 AM

## 2013-01-25 NOTE — Progress Notes (Signed)
Noted plans for d/c home with home health per pt request. I will sign off. [phone]

## 2013-01-26 ENCOUNTER — Inpatient Hospital Stay (HOSPITAL_COMMUNITY): Payer: Medicaid Other

## 2013-01-26 LAB — PROTIME-INR: INR: 3.09 — ABNORMAL HIGH (ref 0.00–1.49)

## 2013-01-26 MED ORDER — OXYCODONE HCL 5 MG PO TABS
5.0000 mg | ORAL_TABLET | ORAL | Status: DC | PRN
  Administered 2013-01-26: 15 mg via ORAL
  Filled 2013-01-26: qty 3

## 2013-01-26 MED ORDER — OXYCODONE HCL 5 MG PO TABS: 5.0000 mg | ORAL_TABLET | ORAL | Status: DC | PRN

## 2013-01-26 MED ORDER — SODIUM CHLORIDE 0.9 % IJ SOLN: 10.0000 mL | INTRAMUSCULAR | Status: DC | PRN

## 2013-01-26 MED ORDER — HEPARIN SOD (PORK) LOCK FLUSH 100 UNIT/ML IV SOLN: 500.0000 [IU] | INTRAVENOUS | Status: DC | PRN

## 2013-01-26 NOTE — Progress Notes (Signed)
ANTICOAGULATION CONSULT NOTE - Follow Up Consult  Pharmacy Consult for Coumadin Indication: VTE prophylaxis  Allergies  Allergen Reactions  . Citalopram     Makes her feel drowsy/sleepy.  . Effexor (Venlafaxine Hcl)     Makes pt feel very bad,will not take again.  . Motrin (Ibuprofen) Swelling    Lips swell  . Trazodone And Nefazodone     Makes pt feel very bad and "hung over"    Patient Measurements: Height: 4' 11.84" (152 cm) (taken from 12/08/12 report) Weight: 203 lb 0.7 oz (92.1 kg) (from 01/13/13) IBW/kg (Calculated) : 45.14 Heparin Dosing Weight:   Vital Signs: Temp: 98.8 F (37.1 C) (06/10 1300) BP: 136/68 mmHg (06/10 1300) Pulse Rate: 101 (06/10 1300)  Labs:  Recent Labs  01/24/13 0505 01/25/13 0450 01/26/13 0430  LABPROT 28.2* 28.2* 30.2*  INR 2.82* 2.82* 3.09*    Estimated Creatinine Clearance: 84.9 ml/min (by C-G formula based on Cr of 0.48).  Assessment: 52yof continues on Coumadin for VTE prophylaxis s/p ortho procedure. INR (3.09) is just above goal level and patient has been more sensitive to Coumadin than expected. Noted Ortho plans for discharge and agree with reduced dosing regimen of 2mg  daily (or as directed by [facility] protocol). - No CBC this AM - No significant bleeding reported  Goal of Therapy:  INR 2-3   Plan:  1. Agree with Ortho discharge regimen of Coumadin 2mg  daily (or as directed per [facility]) 2. Monitor INR closely  Cleon Dew [phone] 01/26/2013,2:42 PM

## 2013-01-26 NOTE — Progress Notes (Signed)
OT Cancellation Note  Patient Details Name: ANH BIGOS MRN: [id number] DOB: July 01, 1961   Cancelled Treatment:    Reason Eval/Treat Not Completed: Fatigue/lethargy limiting ability to participate Pt refusing to get up with therapy at this time and stated she had already been up this morning. Pt unable to stay awake as OT was talking to her.   Earlie Raveling OTR/L [phone] 01/26/2013, 11:01 AM

## 2013-01-26 NOTE — Progress Notes (Addendum)
Subjective: 5 Days Post-Op Procedure(s) (LRB): INTRAMEDULLARY (IM) RETROGRADE FEMORAL NAILING (Right)  Activity level:  Nonweightbearing right. Diet tolerance:  ok Voiding:  ok Patient reports pain as 2 on 0-10 scale.    Objective: Vital signs in last 24 hours: Temp:  [98.1 F (36.7 C)-98.9 F (37.2 C)] 98.7 F (37.1 C) (06/10 0636) Pulse Rate:  [100-102] 100 (06/10 0636) Resp:  [16-18] 18 (06/10 0636) BP: (126-130)/(72-89) 130/89 mmHg (06/10 0636) SpO2:  [98 %-99 %] 99 % (06/10 0636)  Labs: No results found for this basename: HGB,  in the last 72 hours No results found for this basename: WBC, RBC, HCT, PLT,  in the last 72 hours No results found for this basename: NA, K, CL, CO2, BUN, CREATININE, GLUCOSE, CALCIUM,  in the last 72 hours  Recent Labs  01/25/13 0450 01/26/13 0430  INR 2.82* 3.09*    Physical Exam:  Neurologically intact ABD soft Neurovascular intact Sensation intact distally Intact pulses distally Dorsiflexion/Plantar flexion intact Incision: dressing C/D/I No cellulitis present Compartment soft  Assessment/Plan:  5 Days Post-Op Procedure(s) (LRB): INTRAMEDULLARY (IM) RETROGRADE FEMORAL NAILING (Right) Advance diet Up with therapy Discharge home with home health We'll continue with knee brace on at all times and no weight on right leg. Coumadin x4 weeks for DVT prophylaxis. INR to be watched closely by home health care. She should be on Coumadin protocol.  Return to office in 10-14 days. Prescriptions for pain medicine and Coumadin 2 mg dose to be regulated by Coumadin protocol are in the chart.will probably hold for today.pharmacy to determine Will d/c percocet and start oxy ir 5- 15 mg q3 prn pain.    Qualyn Oyervides R 01/26/2013, 9:31 AM

## 2013-01-26 NOTE — Progress Notes (Signed)
RN reviewed discharge instructions with patient and husband. All questions answered. Patient transported to husbands car with RN. Prescriptions given

## 2013-01-26 NOTE — Progress Notes (Signed)
Porta cath deaccessed. Flushed with 10cc NS followed by Heparin 5ml (100u/ml). No bleeding to site, band aid to site for comfort. Ankur Snowdon M 

## 2013-01-27 ENCOUNTER — Inpatient Hospital Stay (HOSPITAL_COMMUNITY): Payer: Medicaid Other

## 2013-01-28 ENCOUNTER — Telehealth (HOSPITAL_COMMUNITY): Payer: Self-pay | Admitting: Oncology

## 2013-01-28 ENCOUNTER — Other Ambulatory Visit: Payer: Medicaid Other | Admitting: Obstetrics and Gynecology

## 2013-01-28 NOTE — Telephone Encounter (Signed)
Elizabeth Orr contacted and advised her chemo appt for the week of 02/01/13 has been changed to 02/02/13 @ 1115 - Elizabeth Orr verbalized understanding.

## 2013-02-01 ENCOUNTER — Other Ambulatory Visit: Payer: Medicaid Other | Admitting: Obstetrics and Gynecology

## 2013-02-02 ENCOUNTER — Encounter (HOSPITAL_COMMUNITY): Payer: Medicaid Other | Attending: Oncology

## 2013-02-02 VITALS — BP 137/91 | HR 114 | Temp 98.1°F | Resp 18 | Wt 211.4 lb

## 2013-02-02 DIAGNOSIS — C50219 Malignant neoplasm of upper-inner quadrant of unspecified female breast: Secondary | ICD-10-CM

## 2013-02-02 DIAGNOSIS — C50419 Malignant neoplasm of upper-outer quadrant of unspecified female breast: Secondary | ICD-10-CM

## 2013-02-02 DIAGNOSIS — C50919 Malignant neoplasm of unspecified site of unspecified female breast: Secondary | ICD-10-CM | POA: Insufficient documentation

## 2013-02-02 DIAGNOSIS — C773 Secondary and unspecified malignant neoplasm of axilla and upper limb lymph nodes: Secondary | ICD-10-CM

## 2013-02-02 DIAGNOSIS — Z5111 Encounter for antineoplastic chemotherapy: Secondary | ICD-10-CM

## 2013-02-02 DIAGNOSIS — C50911 Malignant neoplasm of unspecified site of right female breast: Secondary | ICD-10-CM

## 2013-02-02 LAB — CBC WITH DIFFERENTIAL/PLATELET
Basophils Relative: 0 % (ref 0–1)
Eosinophils Relative: 0 % (ref 0–5)
HCT: 35.9 % — ABNORMAL LOW (ref 36.0–46.0)
Hemoglobin: 10.8 g/dL — ABNORMAL LOW (ref 12.0–15.0)
MCH: 28.5 pg (ref 26.0–34.0)
MCHC: 30.1 g/dL (ref 30.0–36.0)
MCV: 94.7 fL (ref 78.0–100.0)
Monocytes Absolute: 0.1 10*3/uL (ref 0.1–1.0)
Monocytes Relative: 3 % (ref 3–12)
Neutro Abs: 4.2 10*3/uL (ref 1.7–7.7)

## 2013-02-02 LAB — COMPREHENSIVE METABOLIC PANEL
Albumin: 2.4 g/dL — ABNORMAL LOW (ref 3.5–5.2)
BUN: 3 mg/dL — ABNORMAL LOW (ref 6–23)
Chloride: 97 mEq/L (ref 96–112)
Creatinine, Ser: 0.46 mg/dL — ABNORMAL LOW (ref 0.50–1.10)
GFR calc non Af Amer: >90 mL/min (ref >90)
Total Bilirubin: 0.4 mg/dL (ref 0.3–1.2)

## 2013-02-02 MED ORDER — DIPHENHYDRAMINE HCL 50 MG/ML IJ SOLN
50.0000 mg | Freq: Once | INTRAMUSCULAR | Status: AC
  Administered 2013-02-02: 50 mg via INTRAVENOUS

## 2013-02-02 MED ORDER — HEPARIN SOD (PORK) LOCK FLUSH 100 UNIT/ML IV SOLN
INTRAVENOUS | Status: AC
  Filled 2013-02-02: qty 5

## 2013-02-02 MED ORDER — SODIUM CHLORIDE 0.9 % IJ SOLN
10.0000 mL | INTRAMUSCULAR | Status: DC | PRN
  Filled 2013-02-02: qty 10

## 2013-02-02 MED ORDER — SODIUM CHLORIDE 0.9 % IV SOLN
Freq: Once | INTRAVENOUS | Status: AC
  Administered 2013-02-02: 8 mg via INTRAVENOUS
  Filled 2013-02-02: qty 4

## 2013-02-02 MED ORDER — FAMOTIDINE IN NACL 20-0.9 MG/50ML-% IV SOLN
20.0000 mg | Freq: Once | INTRAVENOUS | Status: AC
  Administered 2013-02-02: 20 mg via INTRAVENOUS

## 2013-02-02 MED ORDER — PACLITAXEL CHEMO INJECTION 300 MG/50ML
80.0000 mg/m2 | Freq: Once | INTRAVENOUS | Status: AC
  Administered 2013-02-02: 162 mg via INTRAVENOUS
  Filled 2013-02-02: qty 27

## 2013-02-02 MED ORDER — SODIUM CHLORIDE 0.9 % IV SOLN
Freq: Once | INTRAVENOUS | Status: AC
  Administered 2013-02-02: 12:00:00 via INTRAVENOUS

## 2013-02-02 MED ORDER — HEPARIN SOD (PORK) LOCK FLUSH 100 UNIT/ML IV SOLN
500.0000 [IU] | Freq: Once | INTRAVENOUS | Status: AC | PRN
  Administered 2013-02-02: 500 [IU]
  Filled 2013-02-02: qty 5

## 2013-02-03 ENCOUNTER — Inpatient Hospital Stay (HOSPITAL_COMMUNITY): Payer: Medicaid Other

## 2013-02-03 ENCOUNTER — Ambulatory Visit (HOSPITAL_COMMUNITY): Payer: Medicaid Other | Attending: Physical Therapy | Admitting: Physical Therapy

## 2013-02-04 ENCOUNTER — Other Ambulatory Visit (HOSPITAL_COMMUNITY): Payer: Self-pay | Admitting: Oncology

## 2013-02-04 DIAGNOSIS — E876 Hypokalemia: Secondary | ICD-10-CM

## 2013-02-04 MED ORDER — POTASSIUM CHLORIDE CRYS ER 20 MEQ PO TBCR: 20.0000 meq | EXTENDED_RELEASE_TABLET | Freq: Every day | ORAL | Status: DC

## 2013-02-09 ENCOUNTER — Encounter (HOSPITAL_BASED_OUTPATIENT_CLINIC_OR_DEPARTMENT_OTHER): Payer: Medicaid Other

## 2013-02-09 VITALS — BP 131/86 | HR 108 | Temp 97.2°F | Resp 20 | Wt 191.0 lb

## 2013-02-09 DIAGNOSIS — Z5111 Encounter for antineoplastic chemotherapy: Secondary | ICD-10-CM

## 2013-02-09 DIAGNOSIS — C50419 Malignant neoplasm of upper-outer quadrant of unspecified female breast: Secondary | ICD-10-CM

## 2013-02-09 DIAGNOSIS — C50219 Malignant neoplasm of upper-inner quadrant of unspecified female breast: Secondary | ICD-10-CM

## 2013-02-09 DIAGNOSIS — C50911 Malignant neoplasm of unspecified site of right female breast: Secondary | ICD-10-CM

## 2013-02-09 DIAGNOSIS — C773 Secondary and unspecified malignant neoplasm of axilla and upper limb lymph nodes: Secondary | ICD-10-CM

## 2013-02-09 LAB — CBC WITH DIFFERENTIAL/PLATELET
Basophils Absolute: 0 10*3/uL (ref 0.0–0.1)
Eosinophils Absolute: 0.3 10*3/uL (ref 0.0–0.7)
Eosinophils Relative: 6 % — ABNORMAL HIGH (ref 0–5)
Lymphocytes Relative: 35 % (ref 12–46)
Lymphs Abs: 1.8 10*3/uL (ref 0.7–4.0)
MCH: 30.1 pg (ref 26.0–34.0)
MCV: 93.9 fL (ref 78.0–100.0)
Neutrophils Relative %: 54 % (ref 43–77)
Platelets: 351 10*3/uL (ref 150–400)
RBC: 3.59 MIL/uL — ABNORMAL LOW (ref 3.87–5.11)
RDW: 21.8 % — ABNORMAL HIGH (ref 11.5–15.5)
WBC: 5.2 10*3/uL (ref 4.0–10.5)

## 2013-02-09 LAB — COMPREHENSIVE METABOLIC PANEL
ALT: 30 U/L (ref 0–35)
AST: 34 U/L (ref 0–37)
Alkaline Phosphatase: 150 U/L — ABNORMAL HIGH (ref 39–117)
Calcium: 9.4 mg/dL (ref 8.4–10.5)
Potassium: 3.4 mEq/L — ABNORMAL LOW (ref 3.5–5.1)
Sodium: 138 mEq/L (ref 135–145)
Total Protein: 6.3 g/dL (ref 6.0–8.3)

## 2013-02-09 MED ORDER — SODIUM CHLORIDE 0.9 % IV SOLN
Freq: Once | INTRAVENOUS | Status: AC
  Administered 2013-02-09: 11:00:00 via INTRAVENOUS

## 2013-02-09 MED ORDER — PACLITAXEL CHEMO INJECTION 300 MG/50ML
80.0000 mg/m2 | Freq: Once | INTRAVENOUS | Status: AC
  Administered 2013-02-09: 162 mg via INTRAVENOUS
  Filled 2013-02-09: qty 27

## 2013-02-09 MED ORDER — FAMOTIDINE IN NACL 20-0.9 MG/50ML-% IV SOLN
INTRAVENOUS | Status: AC
  Filled 2013-02-09: qty 50

## 2013-02-09 MED ORDER — SODIUM CHLORIDE 0.9 % IJ SOLN
10.0000 mL | INTRAMUSCULAR | Status: DC | PRN
  Administered 2013-02-09: 10 mL
  Filled 2013-02-09: qty 10

## 2013-02-09 MED ORDER — DIPHENHYDRAMINE HCL 50 MG/ML IJ SOLN
50.0000 mg | Freq: Once | INTRAMUSCULAR | Status: AC
  Administered 2013-02-09: 50 mg via INTRAVENOUS

## 2013-02-09 MED ORDER — HEPARIN SOD (PORK) LOCK FLUSH 100 UNIT/ML IV SOLN
500.0000 [IU] | Freq: Once | INTRAVENOUS | Status: AC | PRN
  Administered 2013-02-09: 500 [IU]
  Filled 2013-02-09: qty 5

## 2013-02-09 MED ORDER — SODIUM CHLORIDE 0.9 % IV SOLN
Freq: Once | INTRAVENOUS | Status: AC
  Administered 2013-02-09: 8 mg via INTRAVENOUS
  Filled 2013-02-09: qty 4

## 2013-02-09 MED ORDER — DIPHENHYDRAMINE HCL 25 MG PO CAPS
ORAL_CAPSULE | ORAL | Status: AC
  Filled 2013-02-09: qty 1

## 2013-02-09 MED ORDER — DIPHENHYDRAMINE HCL 50 MG/ML IJ SOLN
INTRAMUSCULAR | Status: AC
  Filled 2013-02-09: qty 1

## 2013-02-09 MED ORDER — FAMOTIDINE IN NACL 20-0.9 MG/50ML-% IV SOLN
20.0000 mg | Freq: Once | INTRAVENOUS | Status: AC
  Administered 2013-02-09: 20 mg via INTRAVENOUS

## 2013-02-09 NOTE — Progress Notes (Signed)
Patient's mobility is much improved since last week.

## 2013-02-09 NOTE — Progress Notes (Signed)
Tolerated chemo well. 

## 2013-02-16 ENCOUNTER — Inpatient Hospital Stay (HOSPITAL_COMMUNITY): Payer: Medicaid Other

## 2013-02-17 ENCOUNTER — Encounter (HOSPITAL_BASED_OUTPATIENT_CLINIC_OR_DEPARTMENT_OTHER): Payer: Medicaid Other

## 2013-02-17 ENCOUNTER — Encounter (HOSPITAL_COMMUNITY): Payer: Medicaid Other | Attending: Oncology

## 2013-02-17 VITALS — BP 115/77 | HR 107 | Temp 98.0°F | Resp 20 | Wt 191.0 lb

## 2013-02-17 DIAGNOSIS — C50419 Malignant neoplasm of upper-outer quadrant of unspecified female breast: Secondary | ICD-10-CM

## 2013-02-17 DIAGNOSIS — C50919 Malignant neoplasm of unspecified site of unspecified female breast: Secondary | ICD-10-CM

## 2013-02-17 DIAGNOSIS — C50911 Malignant neoplasm of unspecified site of right female breast: Secondary | ICD-10-CM

## 2013-02-17 DIAGNOSIS — S7290XD Unspecified fracture of unspecified femur, subsequent encounter for closed fracture with routine healing: Secondary | ICD-10-CM | POA: Insufficient documentation

## 2013-02-17 DIAGNOSIS — G609 Hereditary and idiopathic neuropathy, unspecified: Secondary | ICD-10-CM

## 2013-02-17 DIAGNOSIS — E876 Hypokalemia: Secondary | ICD-10-CM | POA: Insufficient documentation

## 2013-02-17 DIAGNOSIS — Z5111 Encounter for antineoplastic chemotherapy: Secondary | ICD-10-CM

## 2013-02-17 DIAGNOSIS — R109 Unspecified abdominal pain: Secondary | ICD-10-CM

## 2013-02-17 LAB — COMPREHENSIVE METABOLIC PANEL
ALT: 23 U/L (ref 0–35)
AST: 23 U/L (ref 0–37)
Alkaline Phosphatase: 130 U/L — ABNORMAL HIGH (ref 39–117)
CO2: 24 mEq/L (ref 19–32)
Chloride: 104 mEq/L (ref 96–112)
GFR calc Af Amer: >90 mL/min (ref >90)
GFR calc non Af Amer: >90 mL/min (ref >90)
Glucose, Bld: 79 mg/dL (ref 70–99)
Sodium: 141 mEq/L (ref 135–145)
Total Bilirubin: 0.3 mg/dL (ref 0.3–1.2)

## 2013-02-17 LAB — CBC WITH DIFFERENTIAL/PLATELET
Basophils Absolute: 0 10*3/uL (ref 0.0–0.1)
Eosinophils Relative: 3 % (ref 0–5)
HCT: 33.3 % — ABNORMAL LOW (ref 36.0–46.0)
Lymphocytes Relative: 50 % — ABNORMAL HIGH (ref 12–46)
Lymphs Abs: 2.5 10*3/uL (ref 0.7–4.0)
MCV: 94.3 fL (ref 78.0–100.0)
Neutro Abs: 1.7 10*3/uL (ref 1.7–7.7)
Platelets: 255 10*3/uL (ref 150–400)
RBC: 3.53 MIL/uL — ABNORMAL LOW (ref 3.87–5.11)
RDW: 21.5 % — ABNORMAL HIGH (ref 11.5–15.5)
WBC: 4.9 10*3/uL (ref 4.0–10.5)

## 2013-02-17 MED ORDER — PACLITAXEL CHEMO INJECTION 300 MG/50ML
72.0000 mg/m2 | Freq: Once | INTRAVENOUS | Status: AC
  Administered 2013-02-17: 144 mg via INTRAVENOUS
  Filled 2013-02-17: qty 24

## 2013-02-17 MED ORDER — OXYCODONE HCL 5 MG PO TABS
5.0000 mg | ORAL_TABLET | Freq: Once | ORAL | Status: AC
  Administered 2013-02-17: 5 mg via ORAL

## 2013-02-17 MED ORDER — DIPHENHYDRAMINE HCL 50 MG/ML IJ SOLN
INTRAMUSCULAR | Status: AC
  Filled 2013-02-17: qty 1

## 2013-02-17 MED ORDER — POTASSIUM CHLORIDE CRYS ER 20 MEQ PO TBCR: 20.0000 meq | EXTENDED_RELEASE_TABLET | Freq: Every day | ORAL | Status: DC

## 2013-02-17 MED ORDER — SODIUM CHLORIDE 0.9 % IV SOLN
Freq: Once | INTRAVENOUS | Status: AC
  Administered 2013-02-17: 12:00:00 via INTRAVENOUS

## 2013-02-17 MED ORDER — HEPARIN SOD (PORK) LOCK FLUSH 100 UNIT/ML IV SOLN
INTRAVENOUS | Status: AC
  Filled 2013-02-17: qty 5

## 2013-02-17 MED ORDER — SODIUM CHLORIDE 0.9 % IV SOLN
Freq: Once | INTRAVENOUS | Status: AC
  Administered 2013-02-17: 8 mg via INTRAVENOUS
  Filled 2013-02-17: qty 4

## 2013-02-17 MED ORDER — DIPHENHYDRAMINE HCL 50 MG/ML IJ SOLN
50.0000 mg | Freq: Once | INTRAMUSCULAR | Status: AC
  Administered 2013-02-17: 50 mg via INTRAVENOUS

## 2013-02-17 MED ORDER — FAMOTIDINE IN NACL 20-0.9 MG/50ML-% IV SOLN
20.0000 mg | Freq: Once | INTRAVENOUS | Status: AC
  Administered 2013-02-17: 20 mg via INTRAVENOUS

## 2013-02-17 MED ORDER — FAMOTIDINE IN NACL 20-0.9 MG/50ML-% IV SOLN
INTRAVENOUS | Status: AC
  Filled 2013-02-17: qty 50

## 2013-02-17 MED ORDER — HEPARIN SOD (PORK) LOCK FLUSH 100 UNIT/ML IV SOLN
500.0000 [IU] | Freq: Once | INTRAVENOUS | Status: AC | PRN
  Administered 2013-02-17: 500 [IU]
  Filled 2013-02-17: qty 5

## 2013-02-17 MED ORDER — OXYCODONE HCL 5 MG PO TABS
ORAL_TABLET | ORAL | Status: AC
  Filled 2013-02-17: qty 1

## 2013-02-17 MED ORDER — SODIUM CHLORIDE 0.9 % IJ SOLN
10.0000 mL | INTRAMUSCULAR | Status: DC | PRN
  Filled 2013-02-17: qty 10

## 2013-02-17 NOTE — Progress Notes (Signed)
Patient returns to clinic today for consideration of cycle 10 of 12 of her weekly Taxol. She continues to have right leg pain from a recent fracture. She also is having difficult mobility, but admits both are improving. She also has peripheral neuropathy in her hands and feet. She otherwise feels well. She has no other neurologic complaints. She denies any recent fevers or illnesses. She has a good appetite and denies weight loss. She denies any chest pain or shortness of breath. She has no nausea, vomiting, constipation, or diarrhea. She has no urinary complaints. Patient otherwise feels well and offers no further specific platelets today.   GENERAL: Obese, No distress, well nourished.  SKIN:  No rashes or significant lesions noted HEAD: Normocephalic, No masses, lesions, or abnormalities  EYES: Conjunctiva are pink and non-injected  BREAST: Exam deferred today  LUNGS: Clear to auscultation, no crackles or wheezes HEART: Regular rate & rhythm, no murmurs, no gallops, S1 normal and S2 normal  ABDOMEN: Abdomen soft, non-tender, normal bowel sounds, no masses or organomegaly and no hepatosplenomegaly  EXTREMITIES: No edema, no skin discoloration or tenderness NEURO: Alert & oriented, no focal motor/sensory deficits.   1. Breast cancer: Laboratory work is adequate to proceed with cycle 10 of 12 of weekly Taxol. Will dose reduce the remainder of her treatments by 10% to 72 mg/m2 secondary to her peripheral neuropathy. Return to clinic in one and 2 weeks for laboratory work and treatment. Patient will then return to clinic in approximately 3-4 weeks for further evaluation. At this point she'll need referral to radiation oncology for breast radiation. Patient understands to contact the clinic at any time if she has any questions, concerns, or complaints. 2. Peripheral neuropathy: Dose reduce Taxol as above. Can consider gabapentin in the future. 3. Leg fracture: Treatment orthopedics.

## 2013-02-17 NOTE — Progress Notes (Signed)
1514C/o leg pain, s/p fracture. No pain med since she left home this am. Oxycodone 5 mg given for moderate pain rated an 8. 1600 Leg pain decreased to a 7. Patient states she will repeat oxycodone at home. Otherwise tolerated chemo well.

## 2013-02-17 NOTE — Patient Instructions (Addendum)
[facility] Discharge Instructions  RECOMMENDATIONS MADE BY THE CONSULTANT AND ANY TEST RESULTS WILL BE SENT TO YOUR REFERRING PHYSICIAN.  EXAM FINDINGS BY THE PHYSICIAN TODAY AND SIGNS OR SYMPTOMS TO REPORT TO CLINIC OR PRIMARY PHYSICIAN: Exam findings as discussed by Dr. Orlie Dakin.  Please keep your future chemotherapy and MD visits as scheduled.  MEDICATIONS PRESCRIBED:  1.  Potassium refill e-scribed to [facility]  SPECIAL INSTRUCTIONS/FOLLOW-UP: 1.  Please keep your chemotherapy and office visit appointments as scheduled.  Contact us for any questions or concerns.  Thank you!  Thank you for choosing [facility] to provide your oncology and hematology care.  To afford each patient quality time with our providers, please arrive at least 15 minutes before your scheduled appointment time.  With your help, our goal is to use those 15 minutes to complete the necessary work-up to ensure our physicians have the information they need to help with your evaluation and healthcare recommendations.    Effective January 1st, 2014, we ask that you re-schedule your appointment with our physicians should you arrive 10 or more minutes late for your appointment.  We strive to give you quality time with our providers, and arriving late affects you and other patients whose appointments are after yours.    Again, thank you for choosing [facility].  Our hope is that these requests will decrease the amount of time that you wait before being seen by our physicians.       _____________________________________________________________  Should you have questions after your visit to [facility], please contact our office at [phone] between the hours of 8:30 a.m. and 5:00 p.m.  Voicemails left after 4:30 p.m. will not be returned until the following business day.  For prescription refill requests, have your pharmacy contact our office with your  prescription refill request.

## 2013-02-24 ENCOUNTER — Encounter (HOSPITAL_BASED_OUTPATIENT_CLINIC_OR_DEPARTMENT_OTHER): Payer: Medicaid Other

## 2013-02-24 VITALS — BP 135/85 | HR 108 | Temp 98.6°F | Resp 20 | Wt 206.0 lb

## 2013-02-24 DIAGNOSIS — C50919 Malignant neoplasm of unspecified site of unspecified female breast: Secondary | ICD-10-CM

## 2013-02-24 DIAGNOSIS — Z5111 Encounter for antineoplastic chemotherapy: Secondary | ICD-10-CM

## 2013-02-24 DIAGNOSIS — C50419 Malignant neoplasm of upper-outer quadrant of unspecified female breast: Secondary | ICD-10-CM

## 2013-02-24 DIAGNOSIS — C50911 Malignant neoplasm of unspecified site of right female breast: Secondary | ICD-10-CM

## 2013-02-24 LAB — COMPREHENSIVE METABOLIC PANEL
Albumin: 2.7 g/dL — ABNORMAL LOW (ref 3.5–5.2)
BUN: 3 mg/dL — ABNORMAL LOW (ref 6–23)
Calcium: 8.7 mg/dL (ref 8.4–10.5)
Chloride: 101 mEq/L (ref 96–112)
Creatinine, Ser: 0.43 mg/dL — ABNORMAL LOW (ref 0.50–1.10)
GFR calc non Af Amer: >90 mL/min (ref >90)
Total Bilirubin: 0.6 mg/dL (ref 0.3–1.2)

## 2013-02-24 LAB — CBC WITH DIFFERENTIAL/PLATELET
Basophils Relative: 0 % (ref 0–1)
Eosinophils Relative: 1 % (ref 0–5)
HCT: 32.4 % — ABNORMAL LOW (ref 36.0–46.0)
Hemoglobin: 10.1 g/dL — ABNORMAL LOW (ref 12.0–15.0)
MCH: 29.6 pg (ref 26.0–34.0)
MCHC: 31.2 g/dL (ref 30.0–36.0)
MCV: 95 fL (ref 78.0–100.0)
Monocytes Absolute: 0.4 10*3/uL (ref 0.1–1.0)
Monocytes Relative: 5 % (ref 3–12)
Neutro Abs: 4.2 10*3/uL (ref 1.7–7.7)

## 2013-02-24 MED ORDER — FAMOTIDINE IN NACL 20-0.9 MG/50ML-% IV SOLN
INTRAVENOUS | Status: AC
  Filled 2013-02-24: qty 50

## 2013-02-24 MED ORDER — DIPHENHYDRAMINE HCL 50 MG/ML IJ SOLN
INTRAMUSCULAR | Status: AC
  Filled 2013-02-24: qty 1

## 2013-02-24 MED ORDER — HEPARIN SOD (PORK) LOCK FLUSH 100 UNIT/ML IV SOLN
500.0000 [IU] | Freq: Once | INTRAVENOUS | Status: AC | PRN
  Administered 2013-02-24: 500 [IU]
  Filled 2013-02-24: qty 5

## 2013-02-24 MED ORDER — HEPARIN SOD (PORK) LOCK FLUSH 100 UNIT/ML IV SOLN
INTRAVENOUS | Status: AC
  Filled 2013-02-24: qty 5

## 2013-02-24 MED ORDER — SODIUM CHLORIDE 0.9 % IV SOLN
Freq: Once | INTRAVENOUS | Status: AC
  Administered 2013-02-24: 13:00:00 via INTRAVENOUS

## 2013-02-24 MED ORDER — DIPHENHYDRAMINE HCL 50 MG/ML IJ SOLN
50.0000 mg | Freq: Once | INTRAMUSCULAR | Status: AC
  Administered 2013-02-24: 50 mg via INTRAVENOUS

## 2013-02-24 MED ORDER — FAMOTIDINE IN NACL 20-0.9 MG/50ML-% IV SOLN
20.0000 mg | Freq: Once | INTRAVENOUS | Status: AC
  Administered 2013-02-24: 20 mg via INTRAVENOUS

## 2013-02-24 MED ORDER — SODIUM CHLORIDE 0.9 % IJ SOLN
10.0000 mL | INTRAMUSCULAR | Status: DC | PRN
  Administered 2013-02-24: 10 mL
  Filled 2013-02-24: qty 10

## 2013-02-24 MED ORDER — SODIUM CHLORIDE 0.9 % IV SOLN
Freq: Once | INTRAVENOUS | Status: AC
  Administered 2013-02-24: 8 mg via INTRAVENOUS
  Filled 2013-02-24: qty 4

## 2013-02-24 MED ORDER — PACLITAXEL CHEMO INJECTION 300 MG/50ML
72.0000 mg/m2 | Freq: Once | INTRAVENOUS | Status: AC
  Administered 2013-02-24: 144 mg via INTRAVENOUS
  Filled 2013-02-24: qty 24

## 2013-02-24 NOTE — Progress Notes (Signed)
Tolerated chemo well.  D/C home via wheelchair with spouse.

## 2013-03-03 ENCOUNTER — Inpatient Hospital Stay (HOSPITAL_COMMUNITY): Payer: Medicaid Other

## 2013-03-04 ENCOUNTER — Encounter (HOSPITAL_COMMUNITY): Payer: Self-pay | Admitting: Oncology

## 2013-03-04 ENCOUNTER — Encounter (HOSPITAL_BASED_OUTPATIENT_CLINIC_OR_DEPARTMENT_OTHER): Payer: Medicaid Other

## 2013-03-04 ENCOUNTER — Other Ambulatory Visit (HOSPITAL_COMMUNITY): Payer: Self-pay | Admitting: Oncology

## 2013-03-04 VITALS — BP 142/89 | HR 104 | Temp 97.0°F | Resp 20

## 2013-03-04 DIAGNOSIS — S7292XD Unspecified fracture of left femur, subsequent encounter for closed fracture with routine healing: Secondary | ICD-10-CM

## 2013-03-04 DIAGNOSIS — Z5111 Encounter for antineoplastic chemotherapy: Secondary | ICD-10-CM

## 2013-03-04 DIAGNOSIS — C50919 Malignant neoplasm of unspecified site of unspecified female breast: Secondary | ICD-10-CM

## 2013-03-04 DIAGNOSIS — C50419 Malignant neoplasm of upper-outer quadrant of unspecified female breast: Secondary | ICD-10-CM

## 2013-03-04 DIAGNOSIS — C50911 Malignant neoplasm of unspecified site of right female breast: Secondary | ICD-10-CM

## 2013-03-04 LAB — CBC WITH DIFFERENTIAL/PLATELET
Basophils Absolute: 0 10*3/uL (ref 0.0–0.1)
Basophils Relative: 0 % (ref 0–1)
Eosinophils Absolute: 0 10*3/uL (ref 0.0–0.7)
Eosinophils Relative: 0 % (ref 0–5)
HCT: 34.3 % — ABNORMAL LOW (ref 36.0–46.0)
MCH: 31.1 pg (ref 26.0–34.0)
MCHC: 32.1 g/dL (ref 30.0–36.0)
MCV: 96.9 fL (ref 78.0–100.0)
Monocytes Absolute: 0.7 10*3/uL (ref 0.1–1.0)
Platelets: 295 10*3/uL (ref 150–400)
RDW: 20.6 % — ABNORMAL HIGH (ref 11.5–15.5)

## 2013-03-04 LAB — COMPREHENSIVE METABOLIC PANEL
AST: 15 U/L (ref 0–37)
Albumin: 3.4 g/dL — ABNORMAL LOW (ref 3.5–5.2)
CO2: 22 mEq/L (ref 19–32)
Calcium: 9.8 mg/dL (ref 8.4–10.5)
Creatinine, Ser: 0.47 mg/dL — ABNORMAL LOW (ref 0.50–1.10)
GFR calc non Af Amer: >90 mL/min (ref >90)
Total Protein: 6.7 g/dL (ref 6.0–8.3)

## 2013-03-04 MED ORDER — DIPHENHYDRAMINE HCL 50 MG/ML IJ SOLN
INTRAMUSCULAR | Status: AC
  Filled 2013-03-04: qty 1

## 2013-03-04 MED ORDER — HEPARIN SOD (PORK) LOCK FLUSH 100 UNIT/ML IV SOLN
INTRAVENOUS | Status: AC
  Filled 2013-03-04: qty 5

## 2013-03-04 MED ORDER — SODIUM CHLORIDE 0.9 % IV SOLN
Freq: Once | INTRAVENOUS | Status: AC
  Administered 2013-03-04: 09:00:00 via INTRAVENOUS

## 2013-03-04 MED ORDER — FAMOTIDINE IN NACL 20-0.9 MG/50ML-% IV SOLN
20.0000 mg | Freq: Once | INTRAVENOUS | Status: AC
  Administered 2013-03-04: 20 mg via INTRAVENOUS

## 2013-03-04 MED ORDER — HEPARIN SOD (PORK) LOCK FLUSH 100 UNIT/ML IV SOLN
500.0000 [IU] | Freq: Once | INTRAVENOUS | Status: AC | PRN
  Administered 2013-03-04: 500 [IU]
  Filled 2013-03-04: qty 5

## 2013-03-04 MED ORDER — DIPHENHYDRAMINE HCL 50 MG/ML IJ SOLN
50.0000 mg | Freq: Once | INTRAMUSCULAR | Status: AC
  Administered 2013-03-04: 50 mg via INTRAVENOUS

## 2013-03-04 MED ORDER — SODIUM CHLORIDE 0.9 % IJ SOLN
10.0000 mL | INTRAMUSCULAR | Status: DC | PRN
  Administered 2013-03-04: 10 mL
  Filled 2013-03-04: qty 10

## 2013-03-04 MED ORDER — SODIUM CHLORIDE 0.9 % IV SOLN
Freq: Once | INTRAVENOUS | Status: AC
  Administered 2013-03-04: 8 mg via INTRAVENOUS
  Filled 2013-03-04: qty 4

## 2013-03-04 MED ORDER — OXYCODONE-ACETAMINOPHEN 5-325 MG PO TABS
ORAL_TABLET | ORAL | Status: AC
  Filled 2013-03-04: qty 1

## 2013-03-04 MED ORDER — OXYCODONE-ACETAMINOPHEN 5-325 MG PO TABS
1.0000 | ORAL_TABLET | Freq: Once | ORAL | Status: AC
  Administered 2013-03-04: 1 via ORAL

## 2013-03-04 MED ORDER — PACLITAXEL CHEMO INJECTION 300 MG/50ML
72.0000 mg/m2 | Freq: Once | INTRAVENOUS | Status: AC
  Administered 2013-03-04: 144 mg via INTRAVENOUS
  Filled 2013-03-04: qty 24

## 2013-03-04 MED ORDER — FAMOTIDINE IN NACL 20-0.9 MG/50ML-% IV SOLN
INTRAVENOUS | Status: AC
  Filled 2013-03-04: qty 50

## 2013-03-04 NOTE — Progress Notes (Signed)
Elizabeth Orr tolerated infusions well and without incident; verbalizes understanding for follow-up.  No distress noted at time of discharge and patient was discharged home with her husband.  Elizabeth Orr initially complained of leg pain from her previous fracture/surgery - percocet ordered/given; reports pain improved after med given.

## 2013-03-05 ENCOUNTER — Encounter (HOSPITAL_COMMUNITY): Payer: Self-pay

## 2013-03-10 ENCOUNTER — Encounter: Payer: Self-pay | Admitting: *Deleted

## 2013-03-10 ENCOUNTER — Ambulatory Visit: Payer: Medicaid Other | Admitting: Obstetrics and Gynecology

## 2013-03-15 ENCOUNTER — Ambulatory Visit: Payer: Medicaid Other | Admitting: Obstetrics and Gynecology

## 2013-03-18 ENCOUNTER — Ambulatory Visit (HOSPITAL_COMMUNITY): Payer: Medicaid Other | Admitting: Oncology

## 2013-03-18 NOTE — Progress Notes (Signed)
-  no show, letter sent- 

## 2013-03-22 ENCOUNTER — Ambulatory Visit: Payer: Medicaid Other | Admitting: Obstetrics and Gynecology

## 2013-03-22 ENCOUNTER — Encounter: Payer: Self-pay | Admitting: *Deleted

## 2013-03-25 ENCOUNTER — Telehealth (HOSPITAL_COMMUNITY): Payer: Self-pay | Admitting: Oncology

## 2013-03-25 DIAGNOSIS — G609 Hereditary and idiopathic neuropathy, unspecified: Secondary | ICD-10-CM

## 2013-03-25 MED ORDER — GABAPENTIN 300 MG PO CAPS: ORAL_CAPSULE | ORAL | Status: DC

## 2013-03-25 NOTE — Telephone Encounter (Signed)
Elizabeth Orr called reporting bilateral feet pain and cold sensation, as well.  Denies difficulty with ambulation - has appt scheduled for this upcoming Monday to be seen in the office.  [name], PA-C advised of pt's symptoms, and prescription for Gabapentin was [facility] to [place].  Elizabeth Orr advised of same and verbalized understanding for follow-up.

## 2013-03-29 ENCOUNTER — Ambulatory Visit (HOSPITAL_COMMUNITY): Payer: Medicaid Other

## 2013-04-02 ENCOUNTER — Encounter (HOSPITAL_COMMUNITY): Payer: Self-pay

## 2013-04-13 ENCOUNTER — Ambulatory Visit (HOSPITAL_COMMUNITY): Payer: Medicaid Other

## 2013-04-15 ENCOUNTER — Encounter (HOSPITAL_COMMUNITY): Payer: Medicaid Other

## 2013-04-16 ENCOUNTER — Encounter (HOSPITAL_COMMUNITY): Payer: Medicaid Other | Attending: Oncology

## 2013-04-16 ENCOUNTER — Encounter (HOSPITAL_COMMUNITY): Payer: Self-pay

## 2013-04-16 ENCOUNTER — Encounter (HOSPITAL_COMMUNITY): Payer: Medicaid Other

## 2013-04-16 VITALS — BP 142/97 | HR 112 | Temp 98.8°F | Resp 20

## 2013-04-16 DIAGNOSIS — C50919 Malignant neoplasm of unspecified site of unspecified female breast: Secondary | ICD-10-CM | POA: Insufficient documentation

## 2013-04-16 DIAGNOSIS — K5900 Constipation, unspecified: Secondary | ICD-10-CM

## 2013-04-16 DIAGNOSIS — G609 Hereditary and idiopathic neuropathy, unspecified: Secondary | ICD-10-CM

## 2013-04-16 DIAGNOSIS — Z452 Encounter for adjustment and management of vascular access device: Secondary | ICD-10-CM

## 2013-04-16 DIAGNOSIS — C50911 Malignant neoplasm of unspecified site of right female breast: Secondary | ICD-10-CM

## 2013-04-16 LAB — IRON AND TIBC
Iron: 31 ug/dL — ABNORMAL LOW (ref 42–135)
TIBC: 250 ug/dL (ref 250–470)

## 2013-04-16 LAB — FOLLICLE STIMULATING HORMONE: FSH: 73.3 m[IU]/mL

## 2013-04-16 MED ORDER — HEPARIN SOD (PORK) LOCK FLUSH 100 UNIT/ML IV SOLN
INTRAVENOUS | Status: AC
  Filled 2013-04-16: qty 5

## 2013-04-16 MED ORDER — HEPARIN SOD (PORK) LOCK FLUSH 100 UNIT/ML IV SOLN
500.0000 [IU] | Freq: Once | INTRAVENOUS | Status: AC
  Administered 2013-04-16: 500 [IU] via INTRAVENOUS
  Filled 2013-04-16: qty 5

## 2013-04-16 MED ORDER — TAMOXIFEN CITRATE 20 MG PO TABS: 20.0000 mg | ORAL_TABLET | Freq: Every day | ORAL | Status: DC

## 2013-04-16 MED ORDER — SODIUM CHLORIDE 0.9 % IJ SOLN
10.0000 mL | INTRAMUSCULAR | Status: DC | PRN
  Administered 2013-04-16: 10 mL via INTRAVENOUS
  Filled 2013-04-16: qty 10

## 2013-04-16 NOTE — Patient Instructions (Addendum)
[facility] Discharge Instructions  RECOMMENDATIONS MADE BY THE CONSULTANT AND ANY TEST RESULTS WILL BE SENT TO YOUR REFERRING PHYSICIAN.  EXAM FINDINGS BY THE PHYSICIAN TODAY AND SIGNS OR SYMPTOMS TO REPORT TO CLINIC OR PRIMARY PHYSICIAN: Exam and discussion by Dr. Sharia Reeve.  You are doing well.  MEDICATIONS PRESCRIBED:  Tamoxifen 20 mg daily.  INSTRUCTIONS GIVEN AND DISCUSSED: Report uncontrolled aches or any unusual vaginal bleeding. Report any new lumps, bone pain, shortness of breath or other symptoms.  SPECIAL INSTRUCTIONS/FOLLOW-UP: Follow-up in 1 month.  Thank you for choosing [facility] to provide your oncology and hematology care.  To afford each patient quality time with our providers, please arrive at least 15 minutes before your scheduled appointment time.  With your help, our goal is to use those 15 minutes to complete the necessary work-up to ensure our physicians have the information they need to help with your evaluation and healthcare recommendations.    Effective January 1st, 2014, we ask that you re-schedule your appointment with our physicians should you arrive 10 or more minutes late for your appointment.  We strive to give you quality time with our providers, and arriving late affects you and other patients whose appointments are after yours.    Again, thank you for choosing [facility].  Our hope is that these requests will decrease the amount of time that you wait before being seen by our physicians.       _____________________________________________________________  Should you have questions after your visit to [facility], please contact our office at [phone] between the hours of 8:30 a.m. and 5:00 p.m.  Voicemails left after 4:30 p.m. will not be returned until the following business day.  For prescription refill requests, have your pharmacy contact our office with your prescription refill  request.

## 2013-04-16 NOTE — Progress Notes (Signed)
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  INTERVAL HISTORY:   Ms. Elizabeth Orr was diagnosed with breast cancer on 06/01/2012 following biopsy of the right breast mass. The tumor was ER positive PR positive and HER-2/neu not amplified. (Estrogen Receptor  79%,POSITIVE, Progesterone Receptor 100%,POSITIVE, Ki67 by M IB-1 (Low<20%): 10%)  She had modified right radical mastectomy on 06/08/2012 pathology report is as follows; 1. Lymph node, sentinel, biopsy, right axillary - ONE BENIGN LYMPH NODE, NEGATIVE FOR METASTATIC CARCINOMA (0/1). 2. Breast, modified radical mastectomy , right - NO RESIDUAL CARCINOMA. - FOCAL ATYPICAL DUCTAL HYPERPLASIA. - EXTENSIVE PREVIOUS EXCISIONAL SITE CHANGES. - INKED NEW RESECTION MARGINS, NEGATIVE FOR ATYPIA OR MALIGNANCY. - FOUR OF NINE LYMPH NODES, POSITIVE FOR METASTATIC CARCINOMA WITH EXTRANODAL EXTENSION (4/9). Final pathology stage was pT2p,N2a .  She was treated with 4 cycles of dose dense AC chemotherapy from 07/30/2012-  10/09/12 . Thereafter she began weekly Taxol on 10/28/2012 completing 12 cycles on 03/04/2013.  Patient had a reduction to 72 mg per meter squared for the last 3 cycles. She tells me that she as of today she has completed 5 days  of radiation.  Radiation records are not available to confirm this at this.  She complaints of feeling cold in the fingers and that she was given Neurontin which reportedly helps to some extent .  She denies any shortness of breath and tells me she quit smoking 2 years ago.  She thinks that her last menstrual period was about a year ago.  She denies nausea or vomiting.  Has mild erythema around them mastectomy scar.  Except for the neuropathy in the digit, she says that overall she tolerated chemotherapy well. She is eating well and denies pain except for  right leg pain related to her previous fracture. Elizabeth Orr, a 52 y.o. female returns to the clinic today for scheduled follow up and except as above denies any new complaints. She reports constipation from iron pills and does not want to and 2 new oral iron.  MEDICAL HISTORY: Past Medical History  Diagnosis Date  . Kidney stones 1989  . GERD (gastroesophageal reflux disease)   . Arthritis   . History of stomach ulcers 2003/2007    Hx of esophageal ulcers and stomach ulcers due to reflux  . Back pain   . Chronic leg pain   . Polysubstance abuse   . Depression   . Anxiety   . Exertional shortness of breath   . History of blood transfusion     "recently had my 2nd; related to chemo" (01/21/2013)  . AVWUJWJX(914.7)     "monthly" (01/21/2013)  . Breast cancer 06/08/12    right mastectomy  . Infiltrating ductal carcinoma of breast 07/09/2012    Neijstrom:  chemo    ALLERGIES:  is allergic to citalopram; effexor; motrin; and trazodone and nefazodone.  MEDICATIONS: reviewed.   SURGICAL HISTORY:  Past Surgical History  Procedure Laterality Date  . Esophagogastroduodenoscopy    . Esophageal dilation    . Portacath placement  07/22/2012    Procedure: INSERTION PORT-A-CATH;  Surgeon: Fabio Bering, MD;  Location: [facility];  Service: General;  Laterality: Left;  left subclavian  . Esophagogastroduodenoscopy (egd) with esophageal dilation  10/04/2005    Rourk-Extensive geographic ulcerations distal third of the tubular esoophagus consistent with severe  ulcerative relux esophagitis, early stricture formation status post dilation as described above. 2.  Multiple gastric ulcers without bleeding stigmata as described above. otherwise normal stomach.  3. large bulbar ulcer without bleeding stigmata. otherwise D1 and D2 appeared normal.  . Colonoscopy, esophagogastroduodenoscopy (egd) and esophageal dilation  08/21/2012    Procedure: COLONOSCOPY, ESOPHAGOGASTRODUODENOSCOPY (EGD) AND ESOPHAGEAL  DILATION;  Surgeon: Corbin Ade, MD;  Location: [facility];  Service: Endoscopy;  Laterality: N/A;  9:30  . Breast biopsy Right 04/2012  . Mastectomy Right 06/08/12  . Tubal ligation  1989  . Femur im nail Right 01/21/2013    Procedure: INTRAMEDULLARY (IM) RETROGRADE FEMORAL NAILING;  Surgeon: Velna Ochs, MD;  Location: [facility];  Service: Orthopedics;  Laterality: Right;     REVIEW OF SYSTEMS: 14 point review of system is as in the history above otherwise negative.   PHYSICAL EXAMINATION:  Blood pressure 142/97, pulse 112, temperature 98.8 F (37.1 C), temperature source Oral, resp. rate 20. GENERAL: No acute distress. SKIN:  No rashes or significant lesions . No ecchymosis or petechial rash. HEAD: Normocephalic, No masses, lesions, tenderness or abnormalities  EYES: Conjunctiva are pink and non-injected and no jaundice ENT: External ears normal ,lips, buccal mucosa, and tongue normal and mucous membranes are moist . No evidence of thrush. LYMPH: No palpable lymphadenopathy, in the neck, supraclavicular areas or axilla. BREAST: Right mastectomy scar with mild erythema and radiation markings.  Left breast unremarkable for masses or abnormality.. LUNGS: Clear to auscultation , no crackles or wheezes HEART: regular rate & rhythm, no murmurs, no gallops, S1 normal and S2 normal and no S3. ABDOMEN: Abdomen soft, non-tender, no masses or organomegaly and no hepatosplenomegaly palpable EXTREMITIES: 1 +edema bilaterally, no skin discoloration or tenderness NEURO: Alert & oriented , no focal motor  deficits.     LABORATORY DATA: Lab Results  Component Value Date   WBC 7.1 03/04/2013   HGB 11.0* 03/04/2013   HCT 34.3* 03/04/2013   MCV 96.9 03/04/2013   PLT 295 03/04/2013      Chemistry      Component Value Date/Time   NA 135 03/04/2013 0936   K 3.1* 03/04/2013 0936   CL 101 03/04/2013 0936   CO2 22 03/04/2013 0936   BUN 5* 03/04/2013 0936   CREATININE 0.47* 03/04/2013 0936        Component Value Date/Time   CALCIUM 9.8 03/04/2013 0936   ALKPHOS 96 03/04/2013 0936   AST 15 03/04/2013 0936   ALT 16 03/04/2013 0936   BILITOT 0.5 03/04/2013 0936       RADIOGRAPHIC STUDIES: No results found.   ASSESSMENT:  Stage III right breast cancer s/p mastectomy and chemotherapy with AC-T and now undergoing  Breast irradiation. She has no evidence of disease at this time. We discussed side effects of Tamoxifen including but not limited to had flashes, arthralgias ,DVT and small risk of uterine cancer. Hx of Iron deficiency; does not want to continue oral iron due to constipation. She has mild erythema at the site of Radiation. 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  Reportedly she is receiving some relief from gabapentin.  PLAN:  1. We discussed prognosis of breast cancer. 2. I recommended that patient start tamoxifen at 20 mg daily given perimenopausal status. 3. I ordered FSH and estradiol.  If these are in the menopausal range , the patient can be  switched to Arimidex otherwise this can be repeated in 2 years in amenorrhea  continues and once patient is menopausal she should get at least 2-3 years of aromatase inhibitor as part of minimum of 5 years of endocrine therapy.  She will be a candidate for 10 years of tamoxifen in absence of aromatase inhibitor therapy. 4. I ordered iron studies today and we will inform patient if you need oral iron.  I told  her to stop oral iron. 5. Continue radiation as scheduled and return to clinic in one month to evaluate how she is tolerating tamoxifen.    All questions were satisfactorily answered. Patient knows to call if  any concern arises.  I spent more than 50 % counseling the patient face to face. The total time spent in the appointment was 30 minutes.   Sherral Hammers, MD FACP. Hematology/Oncology.

## 2013-04-16 NOTE — Progress Notes (Signed)
Philemon Kingdom presented for Portacath access and flush. Proper placement of portacath confirmed by CXR. Portacath located left chest wall accessed with  H 20 needle. Good blood return present. Portacath flushed with 20ml NS and 500U/48ml Heparin and needle removed intact. Procedure without incident. Patient tolerated procedure well.

## 2013-04-21 ENCOUNTER — Telehealth (HOSPITAL_COMMUNITY): Payer: Self-pay

## 2013-04-21 NOTE — Telephone Encounter (Signed)
Patient notified regarding need for iron infusion.  Will come tomorrow after radiation.

## 2013-04-22 ENCOUNTER — Ambulatory Visit (HOSPITAL_COMMUNITY): Payer: Medicaid Other

## 2013-04-23 ENCOUNTER — Encounter (HOSPITAL_COMMUNITY): Payer: Medicaid Other | Attending: Oncology

## 2013-04-23 VITALS — BP 127/87 | HR 97 | Temp 98.0°F | Resp 20

## 2013-04-23 DIAGNOSIS — D509 Iron deficiency anemia, unspecified: Secondary | ICD-10-CM

## 2013-04-23 MED ORDER — SODIUM CHLORIDE 0.9 % IV SOLN
INTRAVENOUS | Status: DC
  Administered 2013-04-23: 10:00:00 via INTRAVENOUS

## 2013-04-23 MED ORDER — HEPARIN SOD (PORK) LOCK FLUSH 100 UNIT/ML IV SOLN
500.0000 [IU] | Freq: Once | INTRAVENOUS | Status: AC
  Administered 2013-04-23: 500 [IU] via INTRAVENOUS
  Filled 2013-04-23: qty 5

## 2013-04-23 MED ORDER — HEPARIN SOD (PORK) LOCK FLUSH 100 UNIT/ML IV SOLN
INTRAVENOUS | Status: AC
  Filled 2013-04-23: qty 5

## 2013-04-23 MED ORDER — SODIUM CHLORIDE 0.9 % IV SOLN
1020.0000 mg | Freq: Once | INTRAVENOUS | Status: AC
  Administered 2013-04-23: 1020 mg via INTRAVENOUS
  Filled 2013-04-23: qty 34

## 2013-05-11 ENCOUNTER — Ambulatory Visit (HOSPITAL_COMMUNITY): Payer: Medicaid Other | Admitting: Physical Therapy

## 2013-05-17 ENCOUNTER — Ambulatory Visit (HOSPITAL_COMMUNITY): Payer: Medicaid Other

## 2013-05-18 ENCOUNTER — Ambulatory Visit (HOSPITAL_COMMUNITY): Payer: Medicaid Other

## 2013-05-19 ENCOUNTER — Ambulatory Visit (HOSPITAL_COMMUNITY): Payer: Medicaid Other | Admitting: Physical Therapy

## 2013-05-25 ENCOUNTER — Encounter (HOSPITAL_COMMUNITY): Payer: Medicaid Other

## 2013-05-25 ENCOUNTER — Ambulatory Visit (HOSPITAL_COMMUNITY): Payer: Medicaid Other

## 2013-05-26 NOTE — Progress Notes (Signed)
This encounter was created in error - please disregard.

## 2013-05-27 ENCOUNTER — Telehealth (HOSPITAL_COMMUNITY): Payer: Self-pay | Admitting: *Deleted

## 2013-05-27 NOTE — Telephone Encounter (Signed)
Elizabeth Orr called and said she needed [facility]ills on [facility] [address].and she would run out of Tamoxifen on Saturday.  I rescheduled her for a Doctor visit on the 15th along with a [employer].Marland KitchenMarland Kitchen

## 2013-05-28 ENCOUNTER — Other Ambulatory Visit (HOSPITAL_COMMUNITY): Payer: Self-pay | Admitting: Oncology

## 2013-05-28 ENCOUNTER — Encounter (HOSPITAL_COMMUNITY): Payer: Medicaid Other

## 2013-05-28 DIAGNOSIS — K219 Gastro-esophageal reflux disease without esophagitis: Secondary | ICD-10-CM

## 2013-05-28 MED ORDER — OMEPRAZOLE 20 MG PO CPDR: 20.0000 mg | DELAYED_RELEASE_CAPSULE | Freq: Every day | ORAL | Status: DC

## 2013-05-28 NOTE — Telephone Encounter (Signed)
Elizabeth Orr states she did not know she had more refills on Tamoxifen that she didn't even check first - she thought it needed to be called in every time.    Elizabeth Orr is also reporting "severe" pain to bilateral feet, especially at night.  States she is taking her Neurontin as prescribed.  Requesting additional help with managing her feet pain.

## 2013-05-28 NOTE — Telephone Encounter (Signed)
Elizabeth Orr advised as per Elizabeth Bruin, PA-C's Aleve instructions below and asked to return call to clinic in 1 week to update on bilateral feet pain.  Verbalized understanding of instruction.

## 2013-05-28 NOTE — Telephone Encounter (Signed)
Omeprazole escribed.  She should have many refills left on Tamoxifen.  Please call patient and verify

## 2013-05-28 NOTE — Telephone Encounter (Signed)
She can take Aleve QID for 7 days and see if that helps

## 2013-06-02 ENCOUNTER — Ambulatory Visit (HOSPITAL_COMMUNITY): Payer: Medicaid Other

## 2013-06-02 ENCOUNTER — Encounter (HOSPITAL_COMMUNITY): Payer: Medicaid Other

## 2013-06-04 ENCOUNTER — Encounter (INDEPENDENT_AMBULATORY_CARE_PROVIDER_SITE_OTHER): Payer: Self-pay

## 2013-06-04 ENCOUNTER — Ambulatory Visit (INDEPENDENT_AMBULATORY_CARE_PROVIDER_SITE_OTHER): Payer: Medicaid Other | Admitting: Obstetrics and Gynecology

## 2013-06-04 ENCOUNTER — Encounter: Payer: Self-pay | Admitting: Obstetrics and Gynecology

## 2013-06-04 VITALS — BP 128/80 | Ht 60.0 in | Wt 204.0 lb

## 2013-06-04 DIAGNOSIS — N83202 Unspecified ovarian cyst, left side: Secondary | ICD-10-CM

## 2013-06-04 DIAGNOSIS — N83209 Unspecified ovarian cyst, unspecified side: Secondary | ICD-10-CM

## 2013-06-04 NOTE — Progress Notes (Signed)
Patient ID: Elizabeth Orr, female   DOB: May 01, 1961, 52 y.o.   MRN: [id number] Pt states here to follow up for cyst on ovary, last ultrasound done in June.

## 2013-06-04 NOTE — Patient Instructions (Signed)
WE WILL FOLLOW THIS CYST WITH BLOOD WORK  ( CA 125), and recheck u/s in 3 months.

## 2013-06-04 NOTE — Addendum Note (Signed)
Addended by: Criss Alvine on: 06/04/2013 01:05 PM   Modules accepted: Orders

## 2013-06-04 NOTE — Progress Notes (Signed)
Patient ID: Elizabeth Orr, female   DOB: 01-30-1961, 52 y.o.   MRN: [id number] Elizabeth Orr is an 52 y.o. female. Referred for ovarian mass/cyst  From [facility] Jacalyn Lefevre. This is her first visit. She is status post chemo 2 months for right breast CA, completed one year of treatment along with a right mastectomy. She had positive lymph nodes in the right axilla at the time but no mets to chest or bone. She reports a gradual 10 lb weight loss since stopping her chemo treatment. She is currently c/o LLQ abdoimnal pain. She states that she takes a limited amount of Tylenol with relief and Aleve with no improvement. She denies taking any prescribed pain meds. She had a transvaginal US on 6/11 that showed a left cyst ovary 3 cm in size. Pt was taking anticoagulants in June 2014 of the year. She denies being on any currently. She reports a h/o 2 prior blood transfusion.   Normal appearing Cervix. Last PAP smear was 6 months ago per pt. Will not repeat PAP smear. Pt reports that she is having pain in the LLQ currently and states that the pain is worse with palpation. She denies any prior abdominal surgeries in the same area or prior hernia diagnoses.  Pertinent Gynecological History: Menses: post-menopausal Bleeding: NONE Contraception: post menopausal status DES exposure:  Blood transfusions: 2 Sexually transmitted diseases: no past history Previous GYN Procedures:   Last mammogram: S/P ;RIGHT MASTECTOMY Date:  Last pap: normal Date: 6 months ago OB History: G, P  Menstrual History: Menarche age:  No LMP recorded. Patient is postmenopausal.    Past Medical History  Diagnosis Date  . Kidney stones 1989  . GERD (gastroesophageal reflux disease)   . Arthritis   . History of stomach ulcers 2003/2007    Hx of esophageal ulcers and stomach ulcers due to reflux  . Back pain   . Chronic leg pain   . Polysubstance abuse   . Depression   . Anxiety   . Exertional shortness of breath   . History of  blood transfusion     "recently had my 2nd; related to chemo" (01/21/2013)  . HYQMVHQI(696.2)     "monthly" (01/21/2013)  . Breast cancer 06/08/12    right mastectomy  . Infiltrating ductal carcinoma of breast 07/09/2012    Neijstrom:  chemo    Past Surgical History  Procedure Laterality Date  . Esophagogastroduodenoscopy    . Esophageal dilation    . Portacath placement  07/22/2012    Procedure: INSERTION PORT-A-CATH;  Surgeon: Fabio Bering, MD;  Location: [facility];  Service: General;  Laterality: Left;  left subclavian  . Esophagogastroduodenoscopy (egd) with esophageal dilation  10/04/2005    Rourk-Extensive geographic ulcerations distal third of the tubular esoophagus consistent with severe ulcerative relux esophagitis, early stricture formation status post dilation as described above. 2.  Multiple gastric ulcers without bleeding stigmata as described above. otherwise normal stomach.  3. large bulbar ulcer without bleeding stigmata. otherwise D1 and D2 appeared normal.  . Colonoscopy, esophagogastroduodenoscopy (egd) and esophageal dilation  08/21/2012    Procedure: COLONOSCOPY, ESOPHAGOGASTRODUODENOSCOPY (EGD) AND ESOPHAGEAL DILATION;  Surgeon: Corbin Ade, MD;  Location: [facility];  Service: Endoscopy;  Laterality: N/A;  9:30  . Breast biopsy Right 04/2012  . Mastectomy Right 06/08/12  . Tubal ligation  1989  . Femur im nail Right 01/21/2013    Procedure: INTRAMEDULLARY (IM) RETROGRADE FEMORAL NAILING;  Surgeon: Velna Ochs, MD;  Location: [facility] OR;  Service: Orthopedics;  Laterality: Right;    Family History  Problem Relation Age of Onset  . Heart attack Father   . COPD Father   . Alcohol abuse Brother   . Alcohol abuse Brother   . Diabetes Mother     Social History:  reports that she quit smoking about 21 months ago. She has never used smokeless tobacco. She reports that she does not drink alcohol or use illicit drugs.  Allergies:  Allergies  Allergen Reactions  .  Citalopram     Makes her feel drowsy/sleepy.  . Effexor [Venlafaxine Hcl]     Makes pt feel very bad,will not take again.  . Motrin [Ibuprofen] Swelling    Lips swell  . Trazodone And Nefazodone     Makes pt feel very bad and "hung over"     (Not in a hospital admission)  Review of Systems  Constitutional: Positive for weight loss (10 lbs in past 2 months).  Gastrointestinal: Positive for abdominal pain.  All other systems reviewed and are negative.    Blood pressure 128/80, height 5' (1.524 m), weight 204 lb (92.534 kg). Physical Exam Physical Examination: General appearance - alert, well appearing, and in no distress, overweight, anxious and chronically ill appearing Mental status - alert, oriented to person, place, and time, normal mood, behavior, speech, dress, motor activity, and thought processes Abdomen - tenderness noted Ld even with abd wall tightened no rebound tenderness noted  Pelvic - VULVA: normal appearing vulva with no masses, tenderness or lesions, VAGINA: normal appearing vagina with normal color and discharge, no lesions, atrophic, CERVIX: normal appearing cervix without discharge or lesions,  , UTERUS: uterus is normal size, shape, consistency and nontender, tenderness variable DIFFERENT FROM PT'S CC, anteverted,  ADNEXA: normal adnexa in size, nontender and no masses, tenderness bilateral, both sides, exam limited by OBESITY  No results found for this or any previous visit (from the past 24 hour(s)).  No results found.  Assessment/Plan: MUSCULOSKELETAL WALL PAIN LLQ SIMPLE POSTMENOPAUSAL LEFT ADNEXAL CYST. REC: CHECK cA-125       FOOLLOWUP U/S AND CA 125 AT 6 MONTHS.   Devonne Lalani V 06/04/2013, 11:56 AM

## 2013-06-08 ENCOUNTER — Encounter (HOSPITAL_COMMUNITY): Payer: Medicaid Other | Attending: Oncology

## 2013-06-08 ENCOUNTER — Encounter (HOSPITAL_COMMUNITY): Payer: Self-pay

## 2013-06-08 ENCOUNTER — Encounter (HOSPITAL_COMMUNITY): Payer: Medicaid Other

## 2013-06-08 VITALS — BP 127/77 | HR 101 | Temp 97.9°F | Resp 16

## 2013-06-08 DIAGNOSIS — R51 Headache: Secondary | ICD-10-CM

## 2013-06-08 DIAGNOSIS — R079 Chest pain, unspecified: Secondary | ICD-10-CM

## 2013-06-08 DIAGNOSIS — C50911 Malignant neoplasm of unspecified site of right female breast: Secondary | ICD-10-CM

## 2013-06-08 DIAGNOSIS — N959 Unspecified menopausal and perimenopausal disorder: Secondary | ICD-10-CM

## 2013-06-08 DIAGNOSIS — C50919 Malignant neoplasm of unspecified site of unspecified female breast: Secondary | ICD-10-CM | POA: Insufficient documentation

## 2013-06-08 DIAGNOSIS — D509 Iron deficiency anemia, unspecified: Secondary | ICD-10-CM

## 2013-06-08 DIAGNOSIS — G620 Drug-induced polyneuropathy: Secondary | ICD-10-CM

## 2013-06-08 DIAGNOSIS — C50419 Malignant neoplasm of upper-outer quadrant of unspecified female breast: Secondary | ICD-10-CM

## 2013-06-08 MED ORDER — HYDROCODONE-ACETAMINOPHEN 5-325 MG PO TABS: 2.0000 | ORAL_TABLET | ORAL | Status: DC | PRN

## 2013-06-08 MED ORDER — HEPARIN SOD (PORK) LOCK FLUSH 100 UNIT/ML IV SOLN
500.0000 [IU] | Freq: Once | INTRAVENOUS | Status: AC
  Administered 2013-06-08: 500 [IU] via INTRAVENOUS

## 2013-06-08 MED ORDER — SODIUM CHLORIDE 0.9 % IJ SOLN
10.0000 mL | INTRAMUSCULAR | Status: DC | PRN
  Administered 2013-06-08: 10 mL via INTRAVENOUS

## 2013-06-08 MED ORDER — GABAPENTIN 300 MG PO CAPS: 300.0000 mg | ORAL_CAPSULE | Freq: Four times a day (QID) | ORAL | Status: DC

## 2013-06-08 MED ORDER — HEPARIN SOD (PORK) LOCK FLUSH 100 UNIT/ML IV SOLN
INTRAVENOUS | Status: AC
  Filled 2013-06-08: qty 5

## 2013-06-08 NOTE — Progress Notes (Signed)
[facility] OFFICE PROGRESS NOTE  [name], NP No address on file  DIAGNOSIS: Infiltrating ductal carcinoma of breast, right - Plan: CEA, Cancer antigen 27.29  Chief Complaint  Patient presents with  . Breast Cancer    followup on Tamoxifen    CURRENT THERAPY: tamoxifen 20 mg daily.  INTERVAL HISTORY: Elizabeth Orr 52 y.o. female returns for followup of locally advanced right breast cancer, status post right modified radical mastectomy on 06/08/2012 with 4 of 9 lymph nodes positive for metastatic disease with extranodal extension pT2pN2.  She does have hot flashes vaginal discharge or significant lower extremity swelling. She sustained a fracture lower extremity in June of 2014 and is about to begin rehabilitation. She also continues on extra radiotherapy to the right chest wall with 8 more treatments to go. She's had a headache involving the right side of her head for the last [redacted] weeks along with bilateral burning foot discomfort from previous chemotherapy without much help taking gabapentin 300 mg at bedtime.  MEDICAL HISTORY: Past Medical History  Diagnosis Date  . Kidney stones 1989  . GERD (gastroesophageal reflux disease)   . Arthritis   . History of stomach ulcers 2003/2007    Hx of esophageal ulcers and stomach ulcers due to reflux  . Back pain   . Chronic leg pain   . Polysubstance abuse   . Depression   . Anxiety   . Exertional shortness of breath   . History of blood transfusion     "recently had my 2nd; related to chemo" (01/21/2013)  . WUJWJXBJ(478.2)     "monthly" (01/21/2013)  . Breast cancer 06/08/12    right mastectomy  . Infiltrating ductal carcinoma of breast 07/09/2012    Neijstrom:  chemo    INTERIM HISTORY: has Infiltrating ductal carcinoma of breast; Hematochezia; Dysphagia; and Anemia on her problem list.  Elizabeth Orr was diagnosed with breast cancer on 06/01/2012 following biopsy of the right breast mass. The  tumor was ER positive PR positive and HER-2/neu not amplified. (Estrogen Receptor 79%,POSITIVE, Progesterone Receptor 100%,POSITIVE, Ki67 by M IB-1 (Low<20%): 10%)  She had modified right radical mastectomy on 06/08/2012 pathology report is as follows;  1. Lymph node, sentinel, biopsy, right axillary  - ONE BENIGN LYMPH NODE, NEGATIVE FOR METASTATIC CARCINOMA (0/1).  2. Breast, modified radical mastectomy , right  - NO RESIDUAL CARCINOMA.  - FOCAL ATYPICAL DUCTAL HYPERPLASIA.  - EXTENSIVE PREVIOUS EXCISIONAL SITE CHANGES.  - INKED NEW RESECTION MARGINS, NEGATIVE FOR ATYPIA OR MALIGNANCY.  - FOUR OF NINE LYMPH NODES, POSITIVE FOR METASTATIC CARCINOMA WITH EXTRANODAL  EXTENSION (4/9).  Final pathology stage was pT2p,N2a .  She was treated with 4 cycles of dose dense AC chemotherapy from 07/30/2012- 10/09/12 . Thereafter she began weekly Taxol on 10/28/2012 completing 12 cycles on 03/04/2013. Patient had a reduction to 72 mg per meter squared for the last 3 cycles.  She tells me that she as of today she has completed 5 days of radiation. Radiation records are not available to confirm this at this. She complaints of feeling cold in the fingers and that she was given Neurontin which reportedly helps to some extent . She denies any shortness of breath and tells me she quit smoking 2 years ago. She thinks that her last menstrual period was about a year ago. She denies nausea or vomiting. Has mild erythema around them mastectomy scar. Except for the neuropathy in the digit, she says that overall she  tolerated chemotherapy well. She is eating well and denies pain except for right leg pain related to her previous fracture   ALLERGIES:  is allergic to citalopram; effexor; motrin; and trazodone and nefazodone.  MEDICATIONS: has a current medication list which includes the following prescription(s): albuterol, magic mouthwash, gabapentin, lidocaine-prilocaine, magnesium hydroxide, omeprazole, tamoxifen,  lorazepam, ondansetron, potassium chloride sa, and sucralfate, and the following Facility-Administered Medications: sodium chloride (gu irrigant) and bupivacaine.  SURGICAL HISTORY:  Past Surgical History  Procedure Laterality Date  . Esophagogastroduodenoscopy    . Esophageal dilation    . Portacath placement  07/22/2012    Procedure: INSERTION PORT-A-CATH;  Surgeon: Fabio Bering, MD;  Location: [facility];  Service: General;  Laterality: Left;  left subclavian  . Esophagogastroduodenoscopy (egd) with esophageal dilation  10/04/2005    Rourk-Extensive geographic ulcerations distal third of the tubular esoophagus consistent with severe ulcerative relux esophagitis, early stricture formation status post dilation as described above. 2.  Multiple gastric ulcers without bleeding stigmata as described above. otherwise normal stomach.  3. large bulbar ulcer without bleeding stigmata. otherwise D1 and D2 appeared normal.  . Colonoscopy, esophagogastroduodenoscopy (egd) and esophageal dilation  08/21/2012    Procedure: COLONOSCOPY, ESOPHAGOGASTRODUODENOSCOPY (EGD) AND ESOPHAGEAL DILATION;  Surgeon: Corbin Ade, MD;  Location: [facility];  Service: Endoscopy;  Laterality: N/A;  9:30  . Breast biopsy Right 04/2012  . Mastectomy Right 06/08/12  . Tubal ligation  1989  . Femur im nail Right 01/21/2013    Procedure: INTRAMEDULLARY (IM) RETROGRADE FEMORAL NAILING;  Surgeon: Velna Ochs, MD;  Location: [facility] OR;  Service: Orthopedics;  Laterality: Right;    FAMILY HISTORY: family history includes Alcohol abuse in her brother and brother; COPD in her father; Diabetes in her mother; Heart attack in her father.  SOCIAL HISTORY:  reports that she quit smoking about 22 months ago. She has never used smokeless tobacco. She reports that she does not drink alcohol or use illicit drugs.  REVIEW OF SYSTEMS:  Other than that discussed above is noncontributory.  PHYSICAL EXAMINATION: ECOG PERFORMANCE STATUS: 2 -  Symptomatic, <50% confined to bed  There were no vitals taken for this visit.  GENERAL:alert, no distress and comfortable SKIN: skin color, texture, turgor are normal, no rashes or significant lesions EYES: PERLA; Conjunctiva are pink and non-injected, sclera clear OROPHARYNX:no exudate, no erythema on lips, buccal mucosa, or tongue. NECK: supple, thyroid normal size, non-tender, without nodularity. No masses CHEST: status post right breast mastectomy with exfoliation and blister formation with erythema from external beam radiotherapy. Left breast without mass. LYMPH:  no palpable lymphadenopathy in the cervical, axillary or inguinal LUNGS: clear to auscultation and percussion with normal breathing effort HEART: regular rate & rhythm and no murmurs and no lower extremity edema ABDOMEN:abdomen soft, non-tender and normal bowel sounds MUSCULOSKELETAL:no cyanosis of digits and no clubbing. Range of motion decreased right lower extremity using a wheelchair.Marland Kitchen  NEURO: alert & oriented x 3 with fluent speech, no focal motor/sensory deficits. DTRs lower extremity decreased.   LABORATORY DATA: No visits with results within 30 Day(s) from this visit. Latest known visit with results is:  Office Visit on 04/16/2013  Component Date Value Range Status  . Iron 04/16/2013 31* 42 - 135 ug/dL Final  . TIBC 45/40/9811 250  250 - 470 ug/dL Final  . Saturation Ratios 04/16/2013 12* 20 - 55 % Final  . UIBC 04/16/2013 219  125 - 400 ug/dL Final   Performed at [facility]  .  Ferritin 04/16/2013 23  10 - 291 ng/mL Final   Performed at [facility]  . [facility] 04/16/2013 73.3   Final   Comment: (NOTE)                          Reference Ranges:                                  Female:                         1.4 -  18.1 mIU/mL                                  Female:   Follicular Phase    2.5 -  10.2 mIU/mL                                            MidCycle Peak       3.4 -  33.4 mIU/mL                                             Luteal Phase        1.5 -   9.1 mIU/mL                                            Post Menopausal    23.0 - 116.3 mIU/mL                                            Pregnant                <   0.3 mIU/mL                          Performed at [facility]  . Estradiol 04/16/2013 14.8   Final   Comment: (NOTE)                            Males                           0.0 -  39.0 pg/mL                            Menstruating Females (by day in cycle relative to LH peak)                            Follicular phase (-12 to -4)   19.5 - 144.2 pg/mL                            Midcycle          (-  3 to +2)   63.9 - 356.7 pg/mL                                                        Postmenopausal Females          0.0 -  32.2 pg/mL                              (untreated)                          Performed at [facility]    PATHOLOGY:  Urinalysis    Component Value Date/Time   COLORURINE AMBER* 01/21/2013 1824   APPEARANCEUR CLOUDY* 01/21/2013 1824   LABSPEC 1.020 01/21/2013 1824   PHURINE 6.0 01/21/2013 1824   GLUCOSEU NEGATIVE 01/21/2013 1824   HGBUR NEGATIVE 01/21/2013 1824   BILIRUBINUR NEGATIVE 01/21/2013 1824   KETONESUR NEGATIVE 01/21/2013 1824   PROTEINUR NEGATIVE 01/21/2013 1824   UROBILINOGEN 0.2 01/21/2013 1824   NITRITE NEGATIVE 01/21/2013 1824   LEUKOCYTESUR NEGATIVE 01/21/2013 1824    RADIOGRAPHIC STUDIES: No results found.  ASSESSMENT:  #1.stage III carcinoma right breast, no evidence of disease. Currently receiving radiotherapy with 8 more treatments to go. Continuing and tolerating tamoxifen. #2. 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 #3. Headache and chest pain. #4. Status post surgery for fractured right lower; to begin rehabilitation. #5. Iron deficiency.   PLAN:  #1. Continue tamoxifen 20 mg daily. #2. Increase gabapentin to 300 mg 4 times a day. #3. Hydrocodone 5/APAP 325 to use up to 2 every 4 hours to control pain. #4.  Office visit 4 weeks without labs. #5. Resume Niferex. one daily.   All questions were answered. The patient knows to call the clinic with any problems, questions or concerns. We can certainly see the patient much sooner if necessary.   I spent 25 minutes counseling the patient face to face. The total time spent in the appointment was 30 minutes.    Maurilio Lovely, MD 06/08/2013 10:09 AM

## 2013-06-08 NOTE — Patient Instructions (Signed)
[facility] Discharge Instructions  RECOMMENDATIONS MADE BY THE CONSULTANT AND ANY TEST RESULTS WILL BE SENT TO YOUR REFERRING PHYSICIAN.  EXAM FINDINGS BY THE PHYSICIAN TODAY AND SIGNS OR SYMPTOMS TO REPORT TO CLINIC OR PRIMARY PHYSICIAN: Exam and findings as discussed by Dr. Zigmund Daniel.  MEDICATIONS PRESCRIBED:  1.  Increase your Neurontin to 300mg  four times a day.  You've been given a new prescription to accommodate this frequency increase. 2.  Hydrocodone as prescribed for headache and chest pains while on radiation.  INSTRUCTIONS/FOLLOW-UP: 1.  Resume Niferex. 2.  Return in 1 month as scheduled.  Thank you for choosing [facility] to provide your oncology and hematology care.  To afford each patient quality time with our providers, please arrive at least 15 minutes before your scheduled appointment time.  With your help, our goal is to use those 15 minutes to complete the necessary work-up to ensure our physicians have the information they need to help with your evaluation and healthcare recommendations.    Effective January 1st, 2014, we ask that you re-schedule your appointment with our physicians should you arrive 10 or more minutes late for your appointment.  We strive to give you quality time with our providers, and arriving late affects you and other patients whose appointments are after yours.    Again, thank you for choosing [facility].  Our hope is that these requests will decrease the amount of time that you wait before being seen by our physicians.       _____________________________________________________________  Should you have questions after your visit to [facility], please contact our office at [phone] between the hours of 8:30 a.m. and 5:00 p.m.  Voicemails left after 4:30 p.m. will not be returned until the following business day.  For prescription refill requests, have your pharmacy contact our office  with your prescription refill request.

## 2013-06-08 NOTE — Progress Notes (Signed)
Elizabeth Orr presented for Portacath access and flush.  Proper placement of portacath confirmed by CXR.  Portacath located left chest wall accessed with  H 20 needle.  Good blood return present. Portacath flushed with 20ml NS and 500U/78ml Heparin and needle removed intact.  Procedure tolerated well and without incident.

## 2013-06-09 ENCOUNTER — Inpatient Hospital Stay (HOSPITAL_COMMUNITY): Admission: RE | Admit: 2013-06-09 | Payer: Medicaid Other | Source: Ambulatory Visit | Admitting: Physical Therapy

## 2013-06-10 ENCOUNTER — Ambulatory Visit (HOSPITAL_COMMUNITY): Payer: Medicaid Other | Admitting: Physical Therapy

## 2013-06-15 ENCOUNTER — Ambulatory Visit (HOSPITAL_COMMUNITY)
Admission: RE | Admit: 2013-06-15 | Discharge: 2013-06-15 | Disposition: A | Discharge status: Home / Self Care | Payer: Medicaid Other | Source: Ambulatory Visit | Attending: Orthopaedic Surgery | Admitting: Orthopaedic Surgery

## 2013-06-15 DIAGNOSIS — R269 Unspecified abnormalities of gait and mobility: Secondary | ICD-10-CM | POA: Insufficient documentation

## 2013-06-15 DIAGNOSIS — M6281 Muscle weakness (generalized): Secondary | ICD-10-CM | POA: Insufficient documentation

## 2013-06-15 DIAGNOSIS — IMO0001 Reserved for inherently not codable concepts without codable children: Secondary | ICD-10-CM | POA: Insufficient documentation

## 2013-06-15 NOTE — Evaluation (Signed)
Physical Therapy Evaluation  Patient Details  Name: Elizabeth Orr MRN: [id number] Date of Birth: [birth year]  Today's Date: 06/15/2013 Time: 1305-1327 PT Time Calculation (min): 22 min Charges: 1 evaluation             Visit#: 1 of 1  Re-eval:   Assessment Diagnosis: generalized weakness Surgical Date: 01/21/13 Next MD Visit: Dr. Yisroel Ramming Prior Therapy: HHPT x8 weeks  Authorization: Medicaid    Authorization Time Period:    Authorization Visit#:   of     Past Medical History:  Past Medical History  Diagnosis Date  . Kidney stones 1989  . GERD (gastroesophageal reflux disease)   . Arthritis   . History of stomach ulcers 2003/2007    Hx of esophageal ulcers and stomach ulcers due to reflux  . Back pain   . Chronic leg pain   . Polysubstance abuse   . Depression   . Anxiety   . Exertional shortness of breath   . History of blood transfusion     "recently had my 2nd; related to chemo" (01/21/2013)  . WUJWJXBJ(478.2)     "monthly" (01/21/2013)  . Breast cancer 06/08/12    right mastectomy  . Infiltrating ductal carcinoma of breast 07/09/2012    Neijstrom:  chemo   Past Surgical History:  Past Surgical History  Procedure Laterality Date  . Esophagogastroduodenoscopy    . Esophageal dilation    . Portacath placement  07/22/2012    Procedure: INSERTION PORT-A-CATH;  Surgeon: Fabio Bering, MD;  Location: [facility];  Service: General;  Laterality: Left;  left subclavian  . Esophagogastroduodenoscopy (egd) with esophageal dilation  10/04/2005    Rourk-Extensive geographic ulcerations distal third of the tubular esoophagus consistent with severe ulcerative relux esophagitis, early stricture formation status post dilation as described above. 2.  Multiple gastric ulcers without bleeding stigmata as described above. otherwise normal stomach.  3. large bulbar ulcer without bleeding stigmata. otherwise D1 and D2 appeared normal.  . Colonoscopy, esophagogastroduodenoscopy (egd) and  esophageal dilation  08/21/2012    Procedure: COLONOSCOPY, ESOPHAGOGASTRODUODENOSCOPY (EGD) AND ESOPHAGEAL DILATION;  Surgeon: Corbin Ade, MD;  Location: [facility];  Service: Endoscopy;  Laterality: N/A;  9:30  . Breast biopsy Right 04/2012  . Mastectomy Right 06/08/12  . Tubal ligation  1989  . Femur im nail Right 01/21/2013    Procedure: INTRAMEDULLARY (IM) RETROGRADE FEMORAL NAILING;  Surgeon: Velna Ochs, MD;  Location: [facility] OR;  Service: Orthopedics;  Laterality: Right;    Subjective Symptoms/Limitations Symptoms: Pt is a 52 year old female referred to PT for generalized muscle weakness with difficulty wakling secondary a right femoral fracture requiring INTRAMEDULLARY (IM) RETROGRADE FEMORAL NAILING on 6/4/ 2014.  At this time she has recieved 8 weeks of HHPT.  She is ambulating with a RW at home and requires use of a W/C for the commuity.  She reports complaince with exercises at home.  Patient Stated Goals: be able to walk longer distances.  Pain Assessment Currently in Pain?: No/denies  Precautions/Restrictions     Balance Screening Balance Screen Has the patient fallen in the past 6 months: No Has the patient had a decrease in activity level because of a fear of falling? : Yes Is the patient reluctant to leave their home because of a fear of falling? : No  Assessment RLE Assessment RLE Assessment: Exceptions to [facility] RLE Strength RLE Overall Strength Comments: hip and knee 3+/5  Mobility/Balance  Ambulation/Gait Ambulation/Gait: Yes Ambulation/Gait Assistance: 4: Min assist  Ambulation Distance (Feet): 5 Feet Assistive device: None Gait Pattern:  (TTWB on RLE)   Physical Therapy Assessment and Plan PT Assessment and Plan Clinical Impression Statement: Pt is a 52 year old female referred to PT for generalized LE weakness with impairments listed below.  At this time explained to pt she is allowed 8 PT visits (w/HHPT and OPPT) she has utilized all of her PT visits  at this time.  Educated and provided pt with a 2 week free membership to the [facility] to continue with strength training on her own.  She is able to independently state her HEP and continues with it 2x/day.  Encouraged her, with the assitance of her husband, to start ambulating at small parking lots and stores with her RW and to participate at the [facility] to build her activity tolerance and strength.  At this time will d/c from PT with a [employer] 2 week free membership pass.  Clinical Impairments Affecting Rehab Potential: Pt without PT visits PT Plan: D/C  Secondary to pt financial ability to afford OP PT services in self pay.   Goals  None  Problem List Patient Active Problem List   Diagnosis Date Noted  . Anemia 08/25/2012  . Hematochezia 08/06/2012  . Dysphagia 08/06/2012  . Infiltrating ductal carcinoma of breast 07/09/2012    PT - End of Session Activity Tolerance: Patient tolerated treatment well PT Plan of Care PT Patient Instructions: [employer], RW in outdoor environments with family member, Use of NuStep, Bike and UBE at [facility] (pt trialed for comfort), continue with HEP given by HHPT.  Consulted and Agree with Plan of Care: Patient  GP    Annett Fabian, MPT, ATC 06/15/2013, 1:38 PM  Physician Documentation Your signature is required to indicate approval of the treatment plan as stated above.  Please sign and either send electronically or make a copy of this report for your files and return this physician signed original.   Please mark one 1.__approve of plan  2. ___approve of plan with the following conditions.   ______________________________                                                          _____________________ Physician Signature                                                                                                             Date

## 2013-06-21 ENCOUNTER — Other Ambulatory Visit (HOSPITAL_COMMUNITY): Payer: Self-pay | Admitting: Oncology

## 2013-06-21 ENCOUNTER — Other Ambulatory Visit (HOSPITAL_COMMUNITY): Payer: Self-pay | Admitting: Hematology and Oncology

## 2013-06-21 ENCOUNTER — Telehealth (HOSPITAL_COMMUNITY): Payer: Self-pay | Admitting: *Deleted

## 2013-06-21 MED ORDER — HYDROCODONE-ACETAMINOPHEN 5-325 MG PO TABS: 2.0000 | ORAL_TABLET | ORAL | Status: DC | PRN

## 2013-06-29 ENCOUNTER — Telehealth (HOSPITAL_COMMUNITY): Payer: Self-pay | Admitting: Oncology

## 2013-06-29 NOTE — Telephone Encounter (Signed)
Elizabeth Orr is requesting refills for potassium and "iron pills."  Saw in Dr. Bertrum Sol 06/08/13 note that she is to resume Niferex, but no prescription exists that I could find.  She also states she's taking Gabapentin 4 x daily with mild relief of symptoms.    Uses [employer].

## 2013-06-30 ENCOUNTER — Other Ambulatory Visit (HOSPITAL_COMMUNITY): Payer: Self-pay | Admitting: Oncology

## 2013-06-30 DIAGNOSIS — D649 Anemia, unspecified: Secondary | ICD-10-CM

## 2013-06-30 DIAGNOSIS — G629 Polyneuropathy, unspecified: Secondary | ICD-10-CM

## 2013-06-30 DIAGNOSIS — E876 Hypokalemia: Secondary | ICD-10-CM

## 2013-06-30 MED ORDER — POTASSIUM CHLORIDE CRYS ER 20 MEQ PO TBCR: 20.0000 meq | EXTENDED_RELEASE_TABLET | Freq: Every day | ORAL | Status: DC

## 2013-06-30 MED ORDER — POLYSACCHARIDE IRON COMPLEX 150 MG PO CAPS: 150.0000 mg | ORAL_CAPSULE | Freq: Every day | ORAL | Status: DC

## 2013-06-30 MED ORDER — GABAPENTIN 300 MG PO CAPS: ORAL_CAPSULE | ORAL | Status: DC

## 2013-07-06 ENCOUNTER — Encounter (HOSPITAL_COMMUNITY): Payer: Medicaid Other | Attending: Oncology

## 2013-07-06 ENCOUNTER — Encounter (HOSPITAL_COMMUNITY): Payer: Self-pay

## 2013-07-06 VITALS — BP 102/73 | HR 96 | Temp 97.7°F | Resp 20 | Wt 203.4 lb

## 2013-07-06 DIAGNOSIS — C50419 Malignant neoplasm of upper-outer quadrant of unspecified female breast: Secondary | ICD-10-CM

## 2013-07-06 DIAGNOSIS — R51 Headache: Secondary | ICD-10-CM | POA: Insufficient documentation

## 2013-07-06 DIAGNOSIS — Z9889 Other specified postprocedural states: Secondary | ICD-10-CM | POA: Insufficient documentation

## 2013-07-06 DIAGNOSIS — G609 Hereditary and idiopathic neuropathy, unspecified: Secondary | ICD-10-CM

## 2013-07-06 DIAGNOSIS — D509 Iron deficiency anemia, unspecified: Secondary | ICD-10-CM | POA: Insufficient documentation

## 2013-07-06 DIAGNOSIS — D500 Iron deficiency anemia secondary to blood loss (chronic): Secondary | ICD-10-CM

## 2013-07-06 DIAGNOSIS — C50911 Malignant neoplasm of unspecified site of right female breast: Secondary | ICD-10-CM

## 2013-07-06 DIAGNOSIS — C50919 Malignant neoplasm of unspecified site of unspecified female breast: Secondary | ICD-10-CM | POA: Insufficient documentation

## 2013-07-06 DIAGNOSIS — Z95828 Presence of other vascular implants and grafts: Secondary | ICD-10-CM

## 2013-07-06 LAB — CBC WITH DIFFERENTIAL/PLATELET
Basophils Absolute: 0 10*3/uL (ref 0.0–0.1)
Eosinophils Absolute: 0.2 10*3/uL (ref 0.0–0.7)
Eosinophils Relative: 4 % (ref 0–5)
Lymphocytes Relative: 38 % (ref 12–46)
MCH: 35.8 pg — ABNORMAL HIGH (ref 26.0–34.0)
MCV: 108.9 fL — ABNORMAL HIGH (ref 78.0–100.0)
Neutrophils Relative %: 48 % (ref 43–77)
Platelets: 174 10*3/uL (ref 150–400)
RBC: 4.16 MIL/uL (ref 3.87–5.11)
RDW: 15 % (ref 11.5–15.5)
WBC: 4.4 10*3/uL (ref 4.0–10.5)

## 2013-07-06 LAB — COMPREHENSIVE METABOLIC PANEL
AST: 147 U/L — ABNORMAL HIGH (ref 0–37)
Albumin: 2.5 g/dL — ABNORMAL LOW (ref 3.5–5.2)
Alkaline Phosphatase: 158 U/L — ABNORMAL HIGH (ref 39–117)
BUN: <3 mg/dL — ABNORMAL LOW (ref 6–23)
CO2: 21 mEq/L (ref 19–32)
Calcium: 9.1 mg/dL (ref 8.4–10.5)
Chloride: 101 mEq/L (ref 96–112)
Creatinine, Ser: 0.33 mg/dL — ABNORMAL LOW (ref 0.50–1.10)
GFR calc Af Amer: >90 mL/min (ref >90)
GFR calc non Af Amer: >90 mL/min (ref >90)
Glucose, Bld: 101 mg/dL — ABNORMAL HIGH (ref 70–99)
Potassium: 2.8 mEq/L — ABNORMAL LOW (ref 3.5–5.1)
Total Bilirubin: 0.5 mg/dL (ref 0.3–1.2)

## 2013-07-06 LAB — IRON AND TIBC
Iron: 96 ug/dL (ref 42–135)
TIBC: 123 ug/dL — ABNORMAL LOW (ref 250–470)
UIBC: 27 ug/dL — ABNORMAL LOW (ref 125–400)

## 2013-07-06 MED ORDER — SODIUM CHLORIDE 0.9 % IJ SOLN
10.0000 mL | INTRAMUSCULAR | Status: DC | PRN
  Administered 2013-07-06: 10 mL via INTRAVENOUS

## 2013-07-06 MED ORDER — HEPARIN SOD (PORK) LOCK FLUSH 100 UNIT/ML IV SOLN
500.0000 [IU] | Freq: Once | INTRAVENOUS | Status: AC
  Administered 2013-07-06: 500 [IU] via INTRAVENOUS
  Filled 2013-07-06: qty 5

## 2013-07-06 MED ORDER — HYDROCODONE-ACETAMINOPHEN 5-325 MG PO TABS: 2.0000 | ORAL_TABLET | ORAL | Status: DC | PRN

## 2013-07-06 NOTE — Progress Notes (Signed)
[facility]  OFFICE PROGRESS NOTE  [name], NP No address on file  DIAGNOSIS: Infiltrating ductal carcinoma of breast, right - Plan: CEA, Cancer antigen 27.29, Comprehensive metabolic panel, CBC with Differential, MR Brain W Contrast, CBC with Differential, Comprehensive metabolic panel, CEA, Cancer antigen 27.29, Iron and TIBC, Ferritin  Port-a-cath in place - Plan: sodium chloride 0.9 % injection 10 mL, heparin lock flush 100 unit/mL, MR Brain W Contrast  Frequent headaches - Plan: MR Brain W Contrast  Chief Complaint: Breast cancer F/u  CURRENT THERAPY: Tamoxifen    INTERVAL HISTORY:    Elizabeth Orr 52 y.o. female returns for followup of locally advanced right breast cancer, status post right modified radical mastectomy on 06/08/2012 with 4 of 9 lymph nodes positive for metastatic disease with extranodal extension [id number].  She complains of worsening headaches  started approximately 3-4 weeks ago. She also complains of chronic neuropathic pains in both lower extremities and she is on  gabapentin and Narco for pain. She does have minimal hot flashes . C/o intermittent blurred vision for the past 3-4 weeks   She  denies any dizziness, double vision, fevers, chills, night sweats, nausea, vomiting, diarrhea, constipation, chest pain, heart palpitations, shortness of breath, blood in stool, black tarry stool, urinary pain, urinary burning, urinary frequency, hematuria.  MEDICAL HISTORY: Past Medical History  Diagnosis Date  . Kidney stones 1989  . GERD (gastroesophageal reflux disease)   . Arthritis   . History of stomach ulcers 2003/2007    Hx of esophageal ulcers and stomach ulcers due to reflux  . Back pain   . Chronic leg pain   . Polysubstance abuse   . Depression   . Anxiety   . Exertional shortness of breath   . History of blood transfusion     "recently had my 2nd; related to chemo" (01/21/2013)  . EAVWUJWJ(191.4)    "monthly" (01/21/2013)  . Breast cancer 06/08/12    right mastectomy  . Infiltrating ductal carcinoma of breast 07/09/2012    [place]:  chemo    INTERIM HISTORY: has Infiltrating ductal carcinoma of breast; Hematochezia; Dysphagia; and Anemia on her problem list.    ALLERGIES:  is allergic to citalopram; effexor; motrin; and trazodone and nefazodone.  MEDICATIONS:  Current Outpatient Prescriptions  Medication Sig Dispense Refill  . albuterol (PROVENTIL HFA;VENTOLIN HFA) 108 (90 BASE) MCG/ACT inhaler Inhale 2 puffs into the lungs every 6 (six) hours as needed for wheezing.  1 Inhaler  2  . gabapentin (NEURONTIN) 300 MG capsule Take 900 mg in AM and 900 mg in PM  180 capsule  6  . HYDROcodone-acetaminophen (NORCO/VICODIN) 5-325 MG per tablet Take 2 tablets by mouth every 4 (four) hours as needed for pain.  60 tablet  0  . iron polysaccharides (NIFEREX) 150 MG capsule Take 1 capsule (150 mg total) by mouth daily.  30 capsule  5  . lidocaine-prilocaine (EMLA) cream Apply a quarter sized amount to port site 1 hour prior to chemo. Do not rub in.  Cover with plastic.      Marland Kitchen LORazepam (ATIVAN) 1 MG tablet Take 1 mg by mouth every 4 (four) hours as needed. Take 1 tablet every 4 hours IF needed for nausea/vomiting.      . magnesium hydroxide (MILK OF MAGNESIA) 400 MG/5ML suspension Take 30 mLs by mouth daily as needed for constipation.      Marland Kitchen omeprazole (PRILOSEC) 20 MG capsule Take 1 capsule (20 mg  total) by mouth daily.  30 capsule  2  . ondansetron (ZOFRAN) 8 MG tablet Take 8 mg by mouth. Starting the day after chemo, take 1 tablet in the am and 1 tablet in the pm for 2 days. Then may take 1 tablet two times a day IF needed for nausea/vomiting.      . potassium chloride SA (K-DUR,KLOR-CON) 20 MEQ tablet Take 1 tablet (20 mEq total) by mouth daily.  30 tablet  3  . sucralfate (CARAFATE) 1 GM/10ML suspension Take 1 g by mouth 4 (four) times daily.      . tamoxifen (NOLVADEX) 20 MG tablet Take 1  tablet (20 mg total) by mouth daily.  30 tablet  11  . Alum & Mag Hydroxide-Simeth (MAGIC MOUTHWASH) SOLN Take 5 mLs by mouth 4 (four) times daily as needed.        Current Facility-Administered Medications  Medication Dose Route Frequency Provider Last Rate Last Dose  . heparin lock flush 100 unit/mL  500 Units Intravenous Once Horice Carrero, MD      . sodium chloride 0.9 % injection 10 mL  10 mL Intravenous PRN Annamarie Dawley, MD       Facility-Administered Medications Ordered in Other Visits  Medication Dose Route Frequency Provider Last Rate Last Dose  . 0.9 % irrigation (POUR BTL)    PRN Fabio Bering, MD   1,000 mL at 06/02/12 1056  . bupivacaine (MARCAINE) 0.5 % injection    PRN Fabio Bering, MD   10 mL at 06/02/12 1056    SURGICAL HISTORY:  Past Surgical History  Procedure Laterality Date  . Esophagogastroduodenoscopy    . Esophageal dilation    . Portacath placement  07/22/2012    Procedure: INSERTION PORT-A-CATH;  Surgeon: Fabio Bering, MD;  Location: [facility] ORS;  Service: General;  Laterality: Left;  left subclavian  . Esophagogastroduodenoscopy (egd) with esophageal dilation  10/04/2005    Rourk-Extensive geographic ulcerations distal third of the tubular esoophagus consistent with severe ulcerative relux esophagitis, early stricture formation status post dilation as described above. 2.  Multiple gastric ulcers without bleeding stigmata as described above. otherwise normal stomach.  3. large bulbar ulcer without bleeding stigmata. otherwise D1 and D2 appeared normal.  . Colonoscopy, esophagogastroduodenoscopy (egd) and esophageal dilation  08/21/2012    Procedure: COLONOSCOPY, ESOPHAGOGASTRODUODENOSCOPY (EGD) AND ESOPHAGEAL DILATION;  Surgeon: Corbin Ade, MD;  Location: [facility] ENDO SUITE;  Service: Endoscopy;  Laterality: N/A;  9:30  . Breast biopsy Right 04/2012  . Mastectomy Right 06/08/12  . Tubal ligation  1989  . Femur im nail Right 01/21/2013    Procedure: INTRAMEDULLARY  (IM) RETROGRADE FEMORAL NAILING;  Surgeon: Velna Ochs, MD;  Location: [facility] OR;  Service: Orthopedics;  Laterality: Right;    FAMILY HISTORY: family history includes Alcohol abuse in her brother and brother; COPD in her father; Diabetes in her mother; Heart attack in her father.  SOCIAL HISTORY:  reports that she quit smoking about 22 months ago. She has never used smokeless tobacco. She reports that she does not drink alcohol or use illicit drugs.  REVIEW OF SYSTEMS:  As mentioned in interval history  PHYSICAL EXAMINATION: ECOG PERFORMANCE STATUS: 2 - Symptomatic, <50% confined to bed  Blood pressure 102/73, pulse 96, temperature 97.7 F (36.5 C), temperature source Oral, resp. rate 20, weight 203 lb 6.4 oz (92.262 kg).  GENERAL:alert, no distress, well nourished and well developed SKIN: no rashes or significant lesions HEAD: Normocephalic EYES: PERRLA, EOMI, Conjunctiva  are pink and non-injected, sclera clear EARS: External ears normal OROPHARYNX:no erythema, lips, buccal mucosa, and tongue normal and mucous membranes are moist  NECK: supple, no adenopathy, no JVD, no stridor, non-tender LYMPH:  no palpable lymphadenopathy, no hepatosplenomegaly BREAST:   Right breasts-S/p mastectomy scar noted, Left Breast-No masses felt, no suspicious masses, no skin or nipple changes or axillary nodes LUNGS: clear to auscultation , coarse sounds heard HEART: regular rate & rhythm ABDOMEN:abdomen soft, obese and normal bowel sounds BACK: Back symmetric, no curvature. EXTREMITIES:no edema, no clubbing and no cyanosis  NEURO: alert & oriented x 3 with fluent speech, no focal motor/sensory deficits, gait normal   LABORATORY DATA: No visits with results within 30 Day(s) from this visit. Latest known visit with results is:  Office Visit on 04/16/2013  Component Date Value Range Status  . Iron 04/16/2013 31* 42 - 135 ug/dL Final  . TIBC 82/95/6213 250  250 - 470 ug/dL Final  . Saturation  Ratios 04/16/2013 12* 20 - 55 % Final  . UIBC 04/16/2013 219  125 - 400 ug/dL Final   Performed at [facility]  . Ferritin 04/16/2013 23  10 - 291 ng/mL Final   Performed at [facility]  . [facility] 04/16/2013 73.3   Final   Comment: (NOTE)                          Reference Ranges:                                  Female:                         1.4 -  18.1 mIU/mL                                  Female:   Follicular Phase    2.5 -  10.2 mIU/mL                                            MidCycle Peak       3.4 -  33.4 mIU/mL                                            Luteal Phase        1.5 -   9.1 mIU/mL                                            Post Menopausal    23.0 - 116.3 mIU/mL                                            Pregnant                <   0.3 mIU/mL  Performed at [facility]  . Estradiol 04/16/2013 14.8   Final   Comment: (NOTE)                            Males                           0.0 -  39.0 pg/mL                            Menstruating Females (by day in cycle relative to LH peak)                            Follicular phase (-12 to -4)   19.5 - 144.2 pg/mL                            Midcycle          (-3 to +2)   63.9 - 356.7 pg/mL                                                        Postmenopausal Females          0.0 -  32.2 pg/mL                              (untreated)                          Performed at [facility]     Urinalysis    Component Value Date/Time   COLORURINE AMBER* 01/21/2013 1824   APPEARANCEUR CLOUDY* 01/21/2013 1824   LABSPEC 1.020 01/21/2013 1824   PHURINE 6.0 01/21/2013 1824   GLUCOSEU NEGATIVE 01/21/2013 1824   HGBUR NEGATIVE 01/21/2013 1824   BILIRUBINUR NEGATIVE 01/21/2013 1824   KETONESUR NEGATIVE 01/21/2013 1824   PROTEINUR NEGATIVE 01/21/2013 1824   UROBILINOGEN 0.2 01/21/2013 1824   NITRITE NEGATIVE 01/21/2013 1824   LEUKOCYTESUR NEGATIVE 01/21/2013 1824    ASSESSMENT:  #1.stage III  carcinoma right breast, Completed radiation therapy 2 weeks ago. She is on tamoxifen which was started in September 2014. Continue tamoxifen 20 mg by mouth once daily she's been tolerating tamoxifen quite well with minimal hot flashes #2. 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  #3. Worsening Headache with intermittent blurring of vision:  We will arrange for MRI of the brain to rule out brain metastasis  #4. Iron deficiency secondary to GI blood loss:  She is on po iron one tablet daily I have asked the patient to take twice daily.  we'll repeat the iron indices and ferritin level today and  in 2 months  PLAN:  #1. Continue tamoxifen 20 mg daily.  #2. Increase gabapentin to 300 mg 4 times a day.  #3. Hydrocodone 5/APAP 325 to use up to 2 every 4 hours to control pain#60  #4. Office visit 2months.  #5. Port flush today #6 MRI of the brain with and without contrast is been ordered. #7 follow up with radiation oncology are scheduled.  All questions were answered. The patient  knows to call the clinic with any problems, questions or concerns. We can certainly see the patient much sooner if necessary.   I spent 20 minutes counseling the patient face to face. The total time spent in the appointment was 35 minutes   Annamarie Dawley, MD 07/06/2013 1:53 PM

## 2013-07-06 NOTE — Patient Instructions (Signed)
[facility] Discharge Instructions  RECOMMENDATIONS MADE BY THE CONSULTANT AND ANY TEST RESULTS WILL BE SENT TO YOUR REFERRING PHYSICIAN.  Lab work with port flush today. Port flush every 8 weeks. Return to clinic in 2 months to see MD.  Thank you for choosing [facility] to provide your oncology and hematology care.  To afford each patient quality time with our providers, please arrive at least 15 minutes before your scheduled appointment time.  With your help, our goal is to use those 15 minutes to complete the necessary work-up to ensure our physicians have the information they need to help with your evaluation and healthcare recommendations.    Effective January 1st, 2014, we ask that you re-schedule your appointment with our physicians should you arrive 10 or more minutes late for your appointment.  We strive to give you quality time with our providers, and arriving late affects you and other patients whose appointments are after yours.    Again, thank you for choosing [facility].  Our hope is that these requests will decrease the amount of time that you wait before being seen by our physicians.       _____________________________________________________________  Should you have questions after your visit to [facility], please contact our office at [phone] between the hours of 8:30 a.m. and 5:00 p.m.  Voicemails left after 4:30 p.m. will not be returned until the following business day.  For prescription refill requests, have your pharmacy contact our office with your prescription refill request.

## 2013-07-06 NOTE — Addendum Note (Signed)
Addended by: Annamarie Dawley on: 07/06/2013 03:08 PM   Modules accepted: Orders, Medications

## 2013-07-06 NOTE — Progress Notes (Signed)
Elizabeth Orr presented for Portacath access and flush. Proper placement of portacath confirmed by CXR. Portacath located left chest wall accessed with  H 20 needle. No blood return after multiple flushes. Portacath flushed with 20ml NS and 500U/13ml Heparin and needle removed intact. Procedure without incident. Patient tolerated procedure well. Elizabeth Orr presented for labwork. Labs per MD order drawn via Peripheral Line 23 gauge needle inserted in left forearm.  Good blood return present. Procedure without incident.  Needle removed intact. Patient tolerated procedure well.

## 2013-07-06 NOTE — Progress Notes (Addendum)
Our practice received a call from Pharmacy on Narcotic prescription Pt received Narcotics from two different physicians recently We will stop giving Hydrocodone/aceteminophen prescription from now on. I have D/c'd tody's orderes. Patient to F/u with PCP as scheduled for pain management

## 2013-07-07 LAB — CEA: CEA: 2.8 ng/mL (ref 0.0–5.0)

## 2013-07-07 LAB — ERYTHROPOIETIN: Erythropoietin: 21.8 m[IU]/mL — ABNORMAL HIGH (ref 2.6–18.5)

## 2013-07-13 ENCOUNTER — Other Ambulatory Visit (HOSPITAL_COMMUNITY): Payer: Self-pay | Admitting: Oncology

## 2013-07-13 ENCOUNTER — Ambulatory Visit (HOSPITAL_COMMUNITY): Admission: RE | Admit: 2013-07-13 | Payer: Medicaid Other | Source: Ambulatory Visit

## 2013-07-13 DIAGNOSIS — G629 Polyneuropathy, unspecified: Secondary | ICD-10-CM

## 2013-07-13 MED ORDER — GABAPENTIN 300 MG PO CAPS: ORAL_CAPSULE | ORAL | Status: DC

## 2013-07-20 ENCOUNTER — Encounter (HOSPITAL_COMMUNITY): Payer: Medicaid Other

## 2013-07-21 ENCOUNTER — Telehealth (HOSPITAL_COMMUNITY): Payer: Self-pay | Admitting: Oncology

## 2013-07-22 ENCOUNTER — Ambulatory Visit (HOSPITAL_COMMUNITY): Payer: Medicaid Other

## 2013-07-22 ENCOUNTER — Ambulatory Visit (HOSPITAL_BASED_OUTPATIENT_CLINIC_OR_DEPARTMENT_OTHER): Payer: Medicaid Other

## 2013-07-22 DIAGNOSIS — F341 Dysthymic disorder: Secondary | ICD-10-CM

## 2013-07-22 MED ORDER — LORAZEPAM 1 MG PO TABS: 1.0000 mg | ORAL_TABLET | Freq: Once | ORAL | Status: DC

## 2013-07-22 NOTE — Progress Notes (Signed)
Ativan PO prior to MRI exam.  Patient not examined.

## 2013-07-27 ENCOUNTER — Ambulatory Visit (HOSPITAL_COMMUNITY): Payer: Medicaid Other

## 2013-07-27 ENCOUNTER — Other Ambulatory Visit (HOSPITAL_COMMUNITY): Payer: Self-pay | Admitting: Hematology and Oncology

## 2013-07-27 MED ORDER — LORAZEPAM 1 MG PO TABS: 1.0000 mg | ORAL_TABLET | Freq: Once | ORAL | Status: DC

## 2013-07-28 ENCOUNTER — Ambulatory Visit (HOSPITAL_COMMUNITY): Payer: Medicaid Other

## 2013-07-28 ENCOUNTER — Telehealth (HOSPITAL_COMMUNITY): Payer: Self-pay | Admitting: Oncology

## 2013-07-28 NOTE — Telephone Encounter (Signed)
Attempted to reach Elizabeth Orr to inform her a prescription for Ativan has been called in to [facility] for her MRI this Friday.  Unable to reach patient or leave a phone message on the number she's provided.  Phone message was left on her mother's number for a return call to [facility].

## 2013-07-30 ENCOUNTER — Ambulatory Visit (HOSPITAL_COMMUNITY)
Admission: RE | Admit: 2013-07-30 | Discharge: 2013-07-30 | Disposition: A | Discharge status: Home / Self Care | Payer: Medicaid Other | Source: Ambulatory Visit | Attending: Hematology and Oncology | Admitting: Hematology and Oncology

## 2013-07-30 DIAGNOSIS — C50911 Malignant neoplasm of unspecified site of right female breast: Secondary | ICD-10-CM

## 2013-07-30 DIAGNOSIS — R51 Headache: Secondary | ICD-10-CM | POA: Insufficient documentation

## 2013-07-30 DIAGNOSIS — I6789 Other cerebrovascular disease: Secondary | ICD-10-CM | POA: Insufficient documentation

## 2013-07-30 DIAGNOSIS — C50919 Malignant neoplasm of unspecified site of unspecified female breast: Secondary | ICD-10-CM | POA: Insufficient documentation

## 2013-07-30 MED ORDER — GADOBENATE DIMEGLUMINE 529 MG/ML IV SOLN
19.0000 mL | Freq: Once | INTRAVENOUS | Status: AC | PRN
  Administered 2013-07-30: 19 mL via INTRAVENOUS

## 2013-08-02 NOTE — Progress Notes (Signed)
-  Rescheduled-  Elizabeth Orr  

## 2013-08-03 ENCOUNTER — Ambulatory Visit (HOSPITAL_COMMUNITY): Payer: Medicaid Other

## 2013-08-03 ENCOUNTER — Ambulatory Visit (HOSPITAL_COMMUNITY): Payer: Medicaid Other | Admitting: Oncology

## 2013-08-03 NOTE — Progress Notes (Signed)
-   No show, letter sent- Elizabeth Orr   

## 2013-08-04 ENCOUNTER — Ambulatory Visit (HOSPITAL_COMMUNITY): Payer: Medicaid Other | Admitting: Oncology

## 2013-08-04 ENCOUNTER — Encounter (HOSPITAL_COMMUNITY): Payer: Medicaid Other

## 2013-08-06 ENCOUNTER — Ambulatory Visit (HOSPITAL_COMMUNITY): Payer: Medicaid Other

## 2013-08-06 ENCOUNTER — Encounter (HOSPITAL_COMMUNITY): Payer: Medicaid Other

## 2013-08-09 ENCOUNTER — Encounter (HOSPITAL_COMMUNITY): Payer: Self-pay

## 2013-09-02 ENCOUNTER — Encounter (HOSPITAL_COMMUNITY): Payer: Medicaid Other

## 2013-09-02 ENCOUNTER — Ambulatory Visit (HOSPITAL_COMMUNITY): Payer: Medicaid Other

## 2013-09-03 ENCOUNTER — Other Ambulatory Visit: Payer: Self-pay | Admitting: Obstetrics & Gynecology

## 2013-09-03 DIAGNOSIS — N83209 Unspecified ovarian cyst, unspecified side: Secondary | ICD-10-CM

## 2013-09-03 NOTE — Progress Notes (Signed)
This encounter was created in error - please disregard.

## 2013-09-06 ENCOUNTER — Ambulatory Visit: Payer: Medicaid Other | Admitting: Obstetrics and Gynecology

## 2013-09-06 ENCOUNTER — Encounter: Payer: Self-pay | Admitting: *Deleted

## 2013-09-06 ENCOUNTER — Other Ambulatory Visit: Payer: Medicaid Other

## 2013-09-06 ENCOUNTER — Ambulatory Visit (HOSPITAL_COMMUNITY): Payer: Medicaid Other

## 2013-09-06 ENCOUNTER — Encounter (HOSPITAL_COMMUNITY): Payer: Medicaid Other

## 2013-09-07 ENCOUNTER — Ambulatory Visit (HOSPITAL_COMMUNITY): Payer: Medicaid Other

## 2013-09-07 ENCOUNTER — Encounter (HOSPITAL_COMMUNITY): Payer: Medicaid Other

## 2013-09-15 ENCOUNTER — Emergency Department (HOSPITAL_COMMUNITY): Payer: Medicaid Other

## 2013-09-15 ENCOUNTER — Encounter (HOSPITAL_COMMUNITY): Payer: Self-pay | Admitting: Emergency Medicine

## 2013-09-15 ENCOUNTER — Emergency Department (HOSPITAL_COMMUNITY): Payer: Medicaid Other | Admitting: Anesthesiology

## 2013-09-15 ENCOUNTER — Encounter (HOSPITAL_COMMUNITY): Payer: Medicaid Other | Admitting: Anesthesiology

## 2013-09-15 ENCOUNTER — Ambulatory Visit (HOSPITAL_COMMUNITY): Payer: Medicaid Other

## 2013-09-15 ENCOUNTER — Encounter (HOSPITAL_COMMUNITY)
Admission: EM | Disposition: A | Discharge status: Skilled Nursing Facility | Payer: Self-pay | Source: Home / Self Care | Attending: Orthopaedic Surgery

## 2013-09-15 ENCOUNTER — Encounter (HOSPITAL_COMMUNITY): Payer: Medicaid Other

## 2013-09-15 ENCOUNTER — Inpatient Hospital Stay (HOSPITAL_COMMUNITY)
Admission: EM | Admit: 2013-09-15 | Discharge: 2013-09-20 | DRG: 494 | Disposition: A | Discharge status: Skilled Nursing Facility | Payer: Medicaid Other | Attending: Orthopaedic Surgery | Admitting: Orthopaedic Surgery

## 2013-09-15 DIAGNOSIS — F329 Major depressive disorder, single episode, unspecified: Secondary | ICD-10-CM | POA: Diagnosis present

## 2013-09-15 DIAGNOSIS — F3289 Other specified depressive episodes: Secondary | ICD-10-CM | POA: Diagnosis present

## 2013-09-15 DIAGNOSIS — M549 Dorsalgia, unspecified: Secondary | ICD-10-CM | POA: Diagnosis present

## 2013-09-15 DIAGNOSIS — Z9010 Acquired absence of unspecified breast and nipple: Secondary | ICD-10-CM

## 2013-09-15 DIAGNOSIS — Z87442 Personal history of urinary calculi: Secondary | ICD-10-CM

## 2013-09-15 DIAGNOSIS — F411 Generalized anxiety disorder: Secondary | ICD-10-CM | POA: Diagnosis present

## 2013-09-15 DIAGNOSIS — Z853 Personal history of malignant neoplasm of breast: Secondary | ICD-10-CM

## 2013-09-15 DIAGNOSIS — S82843A Displaced bimalleolar fracture of unspecified lower leg, initial encounter for closed fracture: Secondary | ICD-10-CM | POA: Diagnosis present

## 2013-09-15 DIAGNOSIS — Z87891 Personal history of nicotine dependence: Secondary | ICD-10-CM

## 2013-09-15 DIAGNOSIS — Z8249 Family history of ischemic heart disease and other diseases of the circulatory system: Secondary | ICD-10-CM

## 2013-09-15 DIAGNOSIS — K219 Gastro-esophageal reflux disease without esophagitis: Secondary | ICD-10-CM | POA: Diagnosis present

## 2013-09-15 DIAGNOSIS — Z833 Family history of diabetes mellitus: Secondary | ICD-10-CM

## 2013-09-15 DIAGNOSIS — W010XXA Fall on same level from slipping, tripping and stumbling without subsequent striking against object, initial encounter: Secondary | ICD-10-CM | POA: Diagnosis present

## 2013-09-15 DIAGNOSIS — G8929 Other chronic pain: Secondary | ICD-10-CM | POA: Diagnosis present

## 2013-09-15 DIAGNOSIS — Y92009 Unspecified place in unspecified non-institutional (private) residence as the place of occurrence of the external cause: Secondary | ICD-10-CM

## 2013-09-15 DIAGNOSIS — M129 Arthropathy, unspecified: Secondary | ICD-10-CM | POA: Diagnosis present

## 2013-09-15 HISTORY — PX: ORIF ANKLE FRACTURE: SHX5408

## 2013-09-15 LAB — CBC WITH DIFFERENTIAL/PLATELET
Basophils Absolute: 0 10*3/uL (ref 0.0–0.1)
Basophils Relative: 0 % (ref 0–1)
EOS ABS: 0 10*3/uL (ref 0.0–0.7)
Eosinophils Relative: 1 % (ref 0–5)
HCT: 29.6 % — ABNORMAL LOW (ref 36.0–46.0)
Hemoglobin: 9.6 g/dL — ABNORMAL LOW (ref 12.0–15.0)
Lymphocytes Relative: 22 % (ref 12–46)
Lymphs Abs: 1.7 10*3/uL (ref 0.7–4.0)
MCH: 37.9 pg — ABNORMAL HIGH (ref 26.0–34.0)
MCHC: 32.4 g/dL (ref 30.0–36.0)
MCV: 117 fL — ABNORMAL HIGH (ref 78.0–100.0)
Monocytes Absolute: 0.5 10*3/uL (ref 0.1–1.0)
Monocytes Relative: 7 % (ref 3–12)
NEUTROS PCT: 70 % (ref 43–77)
Neutro Abs: 5.3 10*3/uL (ref 1.7–7.7)
PLATELETS: 216 10*3/uL (ref 150–400)
RBC: 2.53 MIL/uL — ABNORMAL LOW (ref 3.87–5.11)
RDW: 18.9 % — ABNORMAL HIGH (ref 11.5–15.5)
WBC: 7.6 10*3/uL (ref 4.0–10.5)

## 2013-09-15 LAB — BASIC METABOLIC PANEL
CALCIUM: 8.1 mg/dL — AB (ref 8.4–10.5)
CO2: 18 mEq/L — ABNORMAL LOW (ref 19–32)
Chloride: 99 mEq/L (ref 96–112)
Creatinine, Ser: 0.41 mg/dL — ABNORMAL LOW (ref 0.50–1.10)
GFR calc Af Amer: >90 mL/min (ref >90)
Glucose, Bld: 102 mg/dL — ABNORMAL HIGH (ref 70–99)
POTASSIUM: 3.3 meq/L — AB (ref 3.7–5.3)
SODIUM: 135 meq/L — AB (ref 137–147)

## 2013-09-15 LAB — HEPATIC FUNCTION PANEL
ALBUMIN: 1.7 g/dL — AB (ref 3.5–5.2)
ALT: 17 U/L (ref 0–35)
AST: 38 U/L — ABNORMAL HIGH (ref 0–37)
Alkaline Phosphatase: 176 U/L — ABNORMAL HIGH (ref 39–117)
BILIRUBIN DIRECT: 0.4 mg/dL — AB (ref 0.0–0.3)
BILIRUBIN TOTAL: 1 mg/dL (ref 0.3–1.2)
Indirect Bilirubin: 0.6 mg/dL (ref 0.3–0.9)
Total Protein: 5.7 g/dL — ABNORMAL LOW (ref 6.0–8.3)

## 2013-09-15 SURGERY — OPEN REDUCTION INTERNAL FIXATION (ORIF) ANKLE FRACTURE
Anesthesia: General | Laterality: Left

## 2013-09-15 MED ORDER — MIDAZOLAM HCL 5 MG/5ML IJ SOLN
INTRAMUSCULAR | Status: DC | PRN
  Administered 2013-09-15: 2 mg via INTRAVENOUS

## 2013-09-15 MED ORDER — SUFENTANIL CITRATE 50 MCG/ML IV SOLN
INTRAVENOUS | Status: AC
  Filled 2013-09-15: qty 1

## 2013-09-15 MED ORDER — DIPHENHYDRAMINE HCL 12.5 MG/5ML PO ELIX
12.5000 mg | ORAL_SOLUTION | Freq: Four times a day (QID) | ORAL | Status: DC | PRN
  Administered 2013-09-16: 12.5 mg via ORAL
  Filled 2013-09-15: qty 5

## 2013-09-15 MED ORDER — SUFENTANIL CITRATE 50 MCG/ML IV SOLN
INTRAVENOUS | Status: DC | PRN
  Administered 2013-09-15 (×5): 10 ug via INTRAVENOUS

## 2013-09-15 MED ORDER — MIDAZOLAM HCL 2 MG/2ML IJ SOLN
INTRAMUSCULAR | Status: AC
  Filled 2013-09-15: qty 2

## 2013-09-15 MED ORDER — METOPROLOL TARTRATE 1 MG/ML IV SOLN
INTRAVENOUS | Status: DC | PRN
  Administered 2013-09-15 (×2): 2.5 mg via INTRAVENOUS

## 2013-09-15 MED ORDER — ETOMIDATE 2 MG/ML IV SOLN
INTRAVENOUS | Status: AC
  Filled 2013-09-15: qty 10

## 2013-09-15 MED ORDER — FENTANYL CITRATE 0.05 MG/ML IJ SOLN
INTRAMUSCULAR | Status: DC | PRN
  Administered 2013-09-15 (×2): 50 ug via INTRAVENOUS
  Administered 2013-09-15: 100 ug via INTRAVENOUS
  Administered 2013-09-15: 50 ug via INTRAVENOUS

## 2013-09-15 MED ORDER — PROPOFOL 10 MG/ML IV BOLUS
INTRAVENOUS | Status: AC
  Filled 2013-09-15: qty 20

## 2013-09-15 MED ORDER — ALBUTEROL SULFATE (2.5 MG/3ML) 0.083% IN NEBU
2.5000 mg | INHALATION_SOLUTION | Freq: Four times a day (QID) | RESPIRATORY_TRACT | Status: AC
  Administered 2013-09-15 – 2013-09-18 (×10): 2.5 mg via RESPIRATORY_TRACT
  Filled 2013-09-15 (×10): qty 3

## 2013-09-15 MED ORDER — ONDANSETRON HCL 4 MG/2ML IJ SOLN
INTRAMUSCULAR | Status: DC | PRN
  Administered 2013-09-15: 4 mg via INTRAVENOUS

## 2013-09-15 MED ORDER — LACTATED RINGERS IV SOLN
INTRAVENOUS | Status: DC | PRN
  Administered 2013-09-15: 16:00:00 via INTRAVENOUS

## 2013-09-15 MED ORDER — MAGNESIUM HYDROXIDE 400 MG/5ML PO SUSP: 30.0000 mL | Freq: Every day | ORAL | Status: DC | PRN

## 2013-09-15 MED ORDER — HYDROGEN PEROXIDE 3 % EX SOLN
CUTANEOUS | Status: DC | PRN
  Administered 2013-09-15: 1 via TOPICAL

## 2013-09-15 MED ORDER — PROMETHAZINE HCL 25 MG/ML IJ SOLN: 12.5000 mg | INTRAMUSCULAR | Status: DC | PRN

## 2013-09-15 MED ORDER — SODIUM CHLORIDE BACTERIOSTATIC 0.9 % IJ SOLN
INTRAMUSCULAR | Status: AC
  Filled 2013-09-15: qty 10

## 2013-09-15 MED ORDER — GABAPENTIN 300 MG PO CAPS
300.0000 mg | ORAL_CAPSULE | Freq: Three times a day (TID) | ORAL | Status: DC
  Administered 2013-09-15 – 2013-09-20 (×14): 300 mg via ORAL
  Filled 2013-09-15 (×14): qty 1

## 2013-09-15 MED ORDER — NALOXONE HCL 0.4 MG/ML IJ SOLN: 0.4000 mg | INTRAMUSCULAR | Status: DC | PRN

## 2013-09-15 MED ORDER — ALBUTEROL SULFATE HFA 108 (90 BASE) MCG/ACT IN AERS
INHALATION_SPRAY | RESPIRATORY_TRACT | Status: DC | PRN
  Administered 2013-09-15: 2 via RESPIRATORY_TRACT
  Administered 2013-09-15: 5 via RESPIRATORY_TRACT

## 2013-09-15 MED ORDER — ENOXAPARIN SODIUM 40 MG/0.4ML ~~LOC~~ SOLN
40.0000 mg | SUBCUTANEOUS | Status: DC
  Administered 2013-09-16 – 2013-09-20 (×5): 40 mg via SUBCUTANEOUS
  Filled 2013-09-15 (×5): qty 0.4

## 2013-09-15 MED ORDER — TAMOXIFEN CITRATE 10 MG PO TABS
20.0000 mg | ORAL_TABLET | Freq: Every day | ORAL | Status: DC
  Administered 2013-09-15 – 2013-09-20 (×6): 20 mg via ORAL
  Filled 2013-09-15 (×6): qty 2

## 2013-09-15 MED ORDER — PROPOFOL 10 MG/ML IV BOLUS
INTRAVENOUS | Status: DC | PRN
  Administered 2013-09-15: 130 mg via INTRAVENOUS

## 2013-09-15 MED ORDER — CEFAZOLIN SODIUM-DEXTROSE 2-3 GM-% IV SOLR
2.0000 g | Freq: Once | INTRAVENOUS | Status: AC
  Administered 2013-09-15: 2 g via INTRAVENOUS
  Filled 2013-09-15: qty 50

## 2013-09-15 MED ORDER — SODIUM CHLORIDE 0.9 % IR SOLN
Status: DC | PRN
  Administered 2013-09-15: 1000 mL

## 2013-09-15 MED ORDER — POTASSIUM CHLORIDE CRYS ER 20 MEQ PO TBCR
20.0000 meq | EXTENDED_RELEASE_TABLET | Freq: Every day | ORAL | Status: DC
  Administered 2013-09-15 – 2013-09-20 (×6): 20 meq via ORAL
  Filled 2013-09-15 (×6): qty 1

## 2013-09-15 MED ORDER — SUCRALFATE 1 GM/10ML PO SUSP
1.0000 g | Freq: Four times a day (QID) | ORAL | Status: DC
  Administered 2013-09-15 – 2013-09-20 (×19): 1 g via ORAL
  Filled 2013-09-15 (×18): qty 10

## 2013-09-15 MED ORDER — HYDROMORPHONE HCL PF 1 MG/ML IJ SOLN
1.0000 mg | Freq: Once | INTRAMUSCULAR | Status: AC
  Administered 2013-09-15: 1 mg via INTRAMUSCULAR
  Filled 2013-09-15: qty 1

## 2013-09-15 MED ORDER — GLYCOPYRROLATE 0.2 MG/ML IJ SOLN
INTRAMUSCULAR | Status: AC
  Filled 2013-09-15: qty 1

## 2013-09-15 MED ORDER — PANTOPRAZOLE SODIUM 40 MG PO TBEC
40.0000 mg | DELAYED_RELEASE_TABLET | Freq: Every day | ORAL | Status: DC
  Administered 2013-09-15 – 2013-09-20 (×6): 40 mg via ORAL
  Filled 2013-09-15 (×6): qty 1

## 2013-09-15 MED ORDER — SODIUM CHLORIDE 0.9 % IV SOLN
Freq: Once | INTRAVENOUS | Status: AC
  Administered 2013-09-15: 13:00:00 via INTRAVENOUS

## 2013-09-15 MED ORDER — ARTIFICIAL TEARS OP OINT
TOPICAL_OINTMENT | OPHTHALMIC | Status: AC
  Filled 2013-09-15: qty 7

## 2013-09-15 MED ORDER — ALBUTEROL SULFATE (2.5 MG/3ML) 0.083% IN NEBU: 3.0000 mL | INHALATION_SOLUTION | Freq: Four times a day (QID) | RESPIRATORY_TRACT | Status: DC | PRN

## 2013-09-15 MED ORDER — ACETAMINOPHEN 325 MG PO TABS: 650.0000 mg | ORAL_TABLET | Freq: Four times a day (QID) | ORAL | Status: DC | PRN

## 2013-09-15 MED ORDER — POLYSACCHARIDE IRON COMPLEX 150 MG PO CAPS
150.0000 mg | ORAL_CAPSULE | Freq: Every day | ORAL | Status: DC
  Administered 2013-09-15 – 2013-09-20 (×6): 150 mg via ORAL
  Filled 2013-09-15 (×6): qty 1

## 2013-09-15 MED ORDER — SUCCINYLCHOLINE CHLORIDE 20 MG/ML IJ SOLN
INTRAMUSCULAR | Status: DC | PRN
  Administered 2013-09-15: 100 mg via INTRAVENOUS

## 2013-09-15 MED ORDER — SODIUM CHLORIDE 0.9 % IJ SOLN: 9.0000 mL | INTRAMUSCULAR | Status: DC | PRN

## 2013-09-15 MED ORDER — LIDOCAINE HCL (CARDIAC) 20 MG/ML IV SOLN
INTRAVENOUS | Status: DC | PRN
  Administered 2013-09-15: 50 mg via INTRAVENOUS

## 2013-09-15 MED ORDER — ONDANSETRON HCL 4 MG/2ML IJ SOLN: 4.0000 mg | Freq: Four times a day (QID) | INTRAMUSCULAR | Status: DC | PRN

## 2013-09-15 MED ORDER — ONDANSETRON HCL 4 MG/2ML IJ SOLN
INTRAMUSCULAR | Status: AC
  Filled 2013-09-15: qty 2

## 2013-09-15 MED ORDER — FENTANYL CITRATE 0.05 MG/ML IJ SOLN
INTRAMUSCULAR | Status: AC
  Filled 2013-09-15: qty 5

## 2013-09-15 MED ORDER — DIPHENHYDRAMINE HCL 50 MG/ML IJ SOLN
12.5000 mg | Freq: Four times a day (QID) | INTRAMUSCULAR | Status: DC | PRN
  Administered 2013-09-15: 23:00:00 via INTRAVENOUS
  Filled 2013-09-15: qty 1

## 2013-09-15 MED ORDER — LIDOCAINE HCL (PF) 1 % IJ SOLN
INTRAMUSCULAR | Status: AC
  Filled 2013-09-15: qty 5

## 2013-09-15 MED ORDER — MORPHINE SULFATE (PF) 1 MG/ML IV SOLN
INTRAVENOUS | Status: DC
  Administered 2013-09-15: 21 mg via INTRAVENOUS
  Administered 2013-09-15 – 2013-09-16 (×3): via INTRAVENOUS
  Administered 2013-09-16: 7.5 mg via INTRAVENOUS
  Administered 2013-09-16: 5.4 mg via INTRAVENOUS
  Administered 2013-09-16: 9.81 mg via INTRAVENOUS
  Administered 2013-09-16: 6 mg via INTRAVENOUS
  Administered 2013-09-16: 3.68 mg via INTRAVENOUS
  Administered 2013-09-17: 3 mg via INTRAVENOUS
  Filled 2013-09-15 (×3): qty 25

## 2013-09-15 MED ORDER — ONDANSETRON 4 MG PO TBDP
4.0000 mg | ORAL_TABLET | Freq: Once | ORAL | Status: AC
  Administered 2013-09-15: 4 mg via ORAL
  Filled 2013-09-15: qty 1

## 2013-09-15 MED ORDER — METOPROLOL TARTRATE 1 MG/ML IV SOLN
INTRAVENOUS | Status: AC
  Filled 2013-09-15: qty 5

## 2013-09-15 MED ORDER — LACTATED RINGERS IV SOLN
INTRAVENOUS | Status: DC
  Administered 2013-09-15 – 2013-09-17 (×4): via INTRAVENOUS

## 2013-09-15 MED ORDER — GLYCOPYRROLATE 0.2 MG/ML IJ SOLN
INTRAMUSCULAR | Status: DC | PRN
  Administered 2013-09-15: 0.2 mg via INTRAVENOUS

## 2013-09-15 SURGICAL SUPPLY — 54 items
BAG HAMPER (MISCELLANEOUS) ×3 IMPLANT
BANDAGE ELASTIC 4 VELCRO NS (GAUZE/BANDAGES/DRESSINGS) ×3 IMPLANT
BANDAGE ELASTIC 6 VELCRO NS (GAUZE/BANDAGES/DRESSINGS) ×2 IMPLANT
BANDAGE ESMARK 4X12 BL STRL LF (DISPOSABLE) ×1 IMPLANT
BIT DRILL 2.5X110 QC LCP DISP (BIT) ×3 IMPLANT
BIT DRILL 2.8 (BIT) ×1
BIT DRILL CANN QC 2.8X165 (BIT) IMPLANT
BLADE SURG 15 STRL LF DISP TIS (BLADE) ×1 IMPLANT
BLADE SURG 15 STRL SS (BLADE) ×3
BLADE SURG SZ10 CARB STEEL (BLADE) ×3 IMPLANT
BNDG CMPR 12X4 ELC STRL LF (DISPOSABLE) ×1
BNDG ESMARK 4X12 BLUE STRL LF (DISPOSABLE) ×3
CLOTH BEACON ORANGE TIMEOUT ST (SAFETY) ×3 IMPLANT
COVER LIGHT HANDLE STERIS (MISCELLANEOUS) ×6 IMPLANT
COVER MAYO STAND XLG (DRAPE) ×3 IMPLANT
CUFF TOURNIQUET SINGLE 34IN LL (TOURNIQUET CUFF) ×3 IMPLANT
DRAPE C-ARM FOLDED MOBILE STRL (DRAPES) ×2 IMPLANT
DRILL BIT 2.8MM (BIT) ×3
DURAPREP 26ML APPLICATOR (WOUND CARE) ×3 IMPLANT
GAUZE XEROFORM 5X9 LF (GAUZE/BANDAGES/DRESSINGS) ×3 IMPLANT
GLOVE BIO SURGEON STRL SZ8 (GLOVE) ×3 IMPLANT
GLOVE BIO SURGEON STRL SZ8.5 (GLOVE) ×3 IMPLANT
GOWN STRL REUS W/TWL LRG LVL3 (GOWN DISPOSABLE) ×7 IMPLANT
GOWN STRL REUS W/TWL XL LVL3 (GOWN DISPOSABLE) ×3 IMPLANT
INST SET MINOR BONE (KITS) ×3 IMPLANT
KIT ROOM TURNOVER APOR (KITS) ×3 IMPLANT
MANIFOLD NEPTUNE II (INSTRUMENTS) ×3 IMPLANT
NS IRRIG 1000ML POUR BTL (IV SOLUTION) ×3 IMPLANT
PACK BASIC LIMB (CUSTOM PROCEDURE TRAY) ×3 IMPLANT
PAD ABD 5X9 TENDERSORB (GAUZE/BANDAGES/DRESSINGS) ×7 IMPLANT
PAD ARMBOARD 7.5X6 YLW CONV (MISCELLANEOUS) ×3 IMPLANT
PAD CAST 4YDX4 CTTN HI CHSV (CAST SUPPLIES) ×1 IMPLANT
PADDING CAST COTTON 4X4 STRL (CAST SUPPLIES) ×3
PLATE LCP 3.5 1/3 TUB 5HX57 (Plate) ×2 IMPLANT
SCREW CANC FT/18 4.0 (Screw) ×2 IMPLANT
SCREW CORTEX 3.5 14MM (Screw) ×2 IMPLANT
SCREW CORTEX 3.5 16MM (Screw) ×2 IMPLANT
SCREW CORTEX 3.5 20MM (Screw) ×2 IMPLANT
SCREW LOCK CORT ST 3.5X14 (Screw) IMPLANT
SCREW LOCK CORT ST 3.5X16 (Screw) IMPLANT
SCREW LOCK CORT ST 3.5X20 (Screw) IMPLANT
SCREW LOCK T15 FT 14X3.5X2.9X (Screw) IMPLANT
SCREW LOCKING 3.5X14 (Screw) ×3 IMPLANT
SET BASIN LINEN APH (SET/KITS/TRAYS/PACK) ×3 IMPLANT
SPLINT J IMMOBILIZER 4X20FT (CAST SUPPLIES) IMPLANT
SPLINT J PLASTER J 4INX20Y (CAST SUPPLIES) ×2
SPONGE GAUZE 4X4 12PLY (GAUZE/BANDAGES/DRESSINGS) ×3 IMPLANT
SPONGE LAP 18X18 X RAY DECT (DISPOSABLE) ×3 IMPLANT
STAPLER VISISTAT 35W (STAPLE) ×2 IMPLANT
SUT CHROMIC 2 0 CT 1 (SUTURE) ×3 IMPLANT
SUT NUROLON CT 2 BLK #1 18IN (SUTURE) ×2 IMPLANT
SUT PLAIN 2 0 XLH (SUTURE) ×5 IMPLANT
TAPE CLOTH 1/2 (GAUZE/BANDAGES/DRESSINGS) ×2 IMPLANT
TOWEL OR 17X26 4PK STRL BLUE (TOWEL DISPOSABLE) ×3 IMPLANT

## 2013-09-15 NOTE — ED Notes (Signed)
Pt transferred to room 3. Report given to Floyce Stakes, RN.

## 2013-09-15 NOTE — Preoperative (Signed)
Beta Blockers   Reason not to administer Beta Blockers:Not Applicable 

## 2013-09-15 NOTE — Anesthesia Postprocedure Evaluation (Signed)
  Anesthesia Post-op Note  Patient: Elizabeth Orr  Procedure(s) Performed: Procedure(s): OPEN REDUCTION INTERNAL FIXATION (ORIF) LEFTANKLE FRACTURE (Left)  Patient Location: PACU  Anesthesia Type:General  Level of Consciousness: awake, alert , oriented and patient cooperative  Airway and Oxygen Therapy: Patient Spontanous Breathing and Patient connected to face mask oxygen  Post-op Pain: mild  Post-op Assessment: Post-op Vital signs reviewed, Patient's Cardiovascular Status Stable, Respiratory Function Stable, Patent Airway, No signs of Nausea or vomiting and Pain level controlled  Post-op Vital Signs: Reviewed and stable  Complications: No apparent anesthesia complications

## 2013-09-15 NOTE — ED Notes (Signed)
Pt c/o left foot/ankle pain that began after a fall this morning. Pt states she tripped in doorway and rolled her ankle. Foot immobilized on arrival.

## 2013-09-15 NOTE — OR Nursing (Signed)
Toenail polish removed

## 2013-09-15 NOTE — ED Notes (Signed)
Unable to get informed consent signed - pt has questions regarding procedure.  Dr. Luna Glasgow notified and will talk to pt in [place].

## 2013-09-15 NOTE — Progress Notes (Signed)
From OR. [place]. Oriented to place per nurse. Left toes pink in color/wrm to touch. Moves left toes without difficulty. Left leg elevated on a pillow.

## 2013-09-15 NOTE — Anesthesia Procedure Notes (Signed)
Procedure Name: Intubation Date/Time: 09/15/2013 4:15 PM Performed by: Andree Elk, Jillyn Stacey A Pre-anesthesia Checklist: Patient identified, Patient being monitored, Timeout performed, Emergency Drugs available and Suction available Patient Re-evaluated:Patient Re-evaluated prior to inductionOxygen Delivery Method: Circle System Utilized Preoxygenation: Pre-oxygenation with 100% oxygen Intubation Type: IV induction, Rapid sequence and Cricoid Pressure applied Laryngoscope Size: 3 and Miller Grade View: Grade I Tube type: Oral Tube size: 7.0 mm Number of attempts: 1 Airway Equipment and Method: stylet Placement Confirmation: ETT inserted through vocal cords under direct vision,  positive ETCO2 and breath sounds checked- equal and bilateral Secured at: 21 cm Tube secured with: Tape Dental Injury: Teeth and Oropharynx as per pre-operative assessment

## 2013-09-15 NOTE — Anesthesia Preprocedure Evaluation (Addendum)
Anesthesia Evaluation  Patient identified by MRN, date of birth, ID band Patient awake    Reviewed: Allergy & Precautions, H&P , NPO status , Patient's Chart, lab work & pertinent test results, reviewed documented beta blocker date and time   Airway Mallampati: II TM Distance: >3 FB Neck ROM: full    Dental  (+) Partial Upper and Partial Lower   Pulmonary shortness of breath and with exertion, former smoker,  breath sounds clear to auscultation        Cardiovascular Rhythm:regular     Neuro/Psych  Headaches, PSYCHIATRIC DISORDERS Anxiety, Depression; History of polysubstance abuse   GI/Hepatic GERD-  Medicated,Last PO intake was 1000pm on 1/27   Endo/Other    Renal/GU Renal diseaseHx of kidney stones     Musculoskeletal   Abdominal   Peds  Hematology  (+) anemia , History Breast ca; s/p chemo;    Anesthesia Other Findings   Reproductive/Obstetrics                          Anesthesia Physical Anesthesia Plan  ASA: III and emergent  Anesthesia Plan: General ETT, Cricoid Pressure and Rapid Sequence   Post-op Pain Management:    Induction:   Airway Management Planned:   Additional Equipment:   Intra-op Plan:   Post-operative Plan:   Informed Consent: I have reviewed the patients History and Physical, chart, labs and discussed the procedure including the risks, benefits and alternatives for the proposed anesthesia with the patient or authorized representative who has indicated his/her understanding and acceptance.   Dental Advisory Given  Plan Discussed with: Anesthesiologist and Surgeon  Anesthesia Plan Comments:        Anesthesia Quick Evaluation

## 2013-09-15 NOTE — Transfer of Care (Signed)
Immediate Anesthesia Transfer of Care Note  Patient: Elizabeth Orr  Procedure(s) Performed: Procedure(s): OPEN REDUCTION INTERNAL FIXATION (ORIF) LEFTANKLE FRACTURE (Left)  Patient Location: PACU  Anesthesia Type:General  Level of Consciousness: awake, alert , oriented and patient cooperative  Airway & Oxygen Therapy: Patient Spontanous Breathing and Patient connected to face mask oxygen  Post-op Assessment: Report given to PACU RN and Post -op Vital signs reviewed and stable  Post vital signs: Reviewed and stable  Complications: No apparent anesthesia complications

## 2013-09-15 NOTE — H&P (Signed)
Elizabeth Orr is an 53 y.o. female.   Chief Complaint: broken left ankle HPI: She fell today while answering her door.  She was using her walker.  She caught left foot on something and fell, hurting her left ankle.  She has deformity, decreased ROM.  X-rays show bimalleolar fracture on the left with lateral displacement.  I have informed her of findings and need for surgery of the left ankle.  I have gone over the risks and imponderables including infection, embolus, need for casting and physical therapy and possible anesthesia risks.  She asked appropriate questions and appears to understand.  Past Medical History  Diagnosis Date  . Kidney stones 1989  . GERD (gastroesophageal reflux disease)   . Arthritis   . History of stomach ulcers 2003/2007    Hx of esophageal ulcers and stomach ulcers due to reflux  . Back pain   . Chronic leg pain   . Polysubstance abuse   . Depression   . Anxiety   . Exertional shortness of breath   . History of blood transfusion     "recently had my 2nd; related to chemo" (01/21/2013)  . CWCBJSEG(315.1)     "monthly" (01/21/2013)  . Breast cancer 06/08/12    right mastectomy  . Infiltrating ductal carcinoma of breast 07/09/2012    Neijstrom:  chemo    Past Surgical History  Procedure Laterality Date  . Esophagogastroduodenoscopy    . Esophageal dilation    . Portacath placement  07/22/2012    Procedure: INSERTION PORT-A-CATH;  Surgeon: Donato Heinz, MD;  Location: [facility];  Service: General;  Laterality: Left;  left subclavian  . Esophagogastroduodenoscopy (egd) with esophageal dilation  10/04/2005    Rourk-Extensive geographic ulcerations distal third of the tubular esoophagus consistent with severe ulcerative relux esophagitis, early stricture formation status post dilation as described above. 2.  Multiple gastric ulcers without bleeding stigmata as described above. otherwise normal stomach.  3. large bulbar ulcer without bleeding stigmata. otherwise D1  and D2 appeared normal.  . Colonoscopy, esophagogastroduodenoscopy (egd) and esophageal dilation  08/21/2012    Procedure: COLONOSCOPY, ESOPHAGOGASTRODUODENOSCOPY (EGD) AND ESOPHAGEAL DILATION;  Surgeon: Daneil Dolin, MD;  Location: [facility];  Service: Endoscopy;  Laterality: N/A;  9:30  . Breast biopsy Right 04/2012  . Mastectomy Right 06/08/12  . Tubal ligation  1989  . Femur im nail Right 01/21/2013    Procedure: INTRAMEDULLARY (IM) RETROGRADE FEMORAL NAILING;  Surgeon: Hessie Dibble, MD;  Location: [facility];  Service: Orthopedics;  Laterality: Right;    Family History  Problem Relation Age of Onset  . Heart attack Father   . COPD Father   . Alcohol abuse Brother   . Alcohol abuse Brother   . Diabetes Mother    Social History:  reports that she quit smoking about 2 years ago. She has never used smokeless tobacco. She reports that she does not drink alcohol or use illicit drugs.  Allergies:  Allergies  Allergen Reactions  . Citalopram     Makes her feel drowsy/sleepy.  . Effexor [Venlafaxine Hcl]     Makes pt feel very bad,will not take again.  . Motrin [Ibuprofen] Swelling    Lips swell  . Trazodone And Nefazodone     Makes pt feel very bad and "hung over"     (Not in a hospital admission)  Results for orders placed during the hospital encounter of 09/15/13 (from the past 48 hour(s))  CBC WITH DIFFERENTIAL  Status: Abnormal   Collection Time    09/15/13 12:55 PM      Result Value Range   WBC 7.6  4.0 - 10.5 K/uL   RBC 2.53 (*) 3.87 - 5.11 MIL/uL   Hemoglobin 9.6 (*) 12.0 - 15.0 g/dL   HCT 29.6 (*) 36.0 - 46.0 %   MCV 117.0 (*) 78.0 - 100.0 fL   MCH 37.9 (*) 26.0 - 34.0 pg   MCHC 32.4  30.0 - 36.0 g/dL   RDW 18.9 (*) 11.5 - 15.5 %   Platelets 216  150 - 400 K/uL   Neutrophils Relative % 70  43 - 77 %   Neutro Abs 5.3  1.7 - 7.7 K/uL   Lymphocytes Relative 22  12 - 46 %   Lymphs Abs 1.7  0.7 - 4.0 K/uL   Monocytes Relative 7  3 - 12 %   Monocytes Absolute  0.5  0.1 - 1.0 K/uL   Eosinophils Relative 1  0 - 5 %   Eosinophils Absolute 0.0  0.0 - 0.7 K/uL   Basophils Relative 0  0 - 1 %   Basophils Absolute 0.0  0.0 - 0.1 K/uL  BASIC METABOLIC PANEL     Status: Abnormal   Collection Time    09/15/13 12:55 PM      Result Value Range   Sodium 135 (*) 137 - 147 mEq/L   Potassium 3.3 (*) 3.7 - 5.3 mEq/L   Chloride 99  96 - 112 mEq/L   CO2 18 (*) 19 - 32 mEq/L   Glucose, Bld 102 (*) 70 - 99 mg/dL   BUN <3 (*) 6 - 23 mg/dL   Creatinine, Ser 0.41 (*) 0.50 - 1.10 mg/dL   Calcium 8.1 (*) 8.4 - 10.5 mg/dL   GFR calc non Af Amer >90  >90 mL/min   GFR calc Af Amer >90  >90 mL/min   Comment: (NOTE)     The eGFR has been calculated using the CKD EPI equation.     This calculation has not been validated in all clinical situations.     eGFR's persistently <90 mL/min signify possible Chronic Kidney     Disease.  HEPATIC FUNCTION PANEL     Status: Abnormal   Collection Time    09/15/13 12:55 PM      Result Value Range   Total Protein 5.7 (*) 6.0 - 8.3 g/dL   Albumin 1.7 (*) 3.5 - 5.2 g/dL   AST 38 (*) 0 - 37 U/L   ALT 17  0 - 35 U/L   Alkaline Phosphatase 176 (*) 39 - 117 U/L   Total Bilirubin 1.0  0.3 - 1.2 mg/dL   Bilirubin, Direct 0.4 (*) 0.0 - 0.3 mg/dL   Indirect Bilirubin 0.6  0.3 - 0.9 mg/dL   Dg Ankle Complete Left  09/15/2013   CLINICAL DATA:  Fall.  Pain and swelling.  EXAM: LEFT ANKLE COMPLETE - 3+ VIEW  COMPARISON:  None  FINDINGS: There is an oblique fracture of the distal fibula. There is an avulsion fracture the medial malleolus. There is pronounced widening of the ankle mortise. Posterior lip of the tibia appears intact.  IMPRESSION: Bimalleolar fracture with widening of the ankle mortise.   Electronically Signed   By: Nelson Chimes M.D.   On: 09/15/2013 11:37   Ct Head Wo Contrast  09/15/2013   CLINICAL DATA:  53 year old female status post fall with head injury. Initial encounter. History of 2013 breast cancer.  EXAM:  CT HEAD  WITHOUT CONTRAST  TECHNIQUE: Contiguous axial images were obtained from the base of the skull through the vertex without intravenous contrast.  COMPARISON:  Brain MRI 07/30/2013.  FINDINGS: Visualized paranasal sinuses and mastoids are clear. Dysconjugate gaze, otherwise negative visualized orbits soft tissues. No scalp hematoma identified. No acute or suspicious osseous lesion identified.  Stable cerebral volume. Mild patchy white matter hypodensity appears stable. No midline shift, mass effect, or evidence of intracranial mass lesion. No evidence of cortically based acute infarction identified. No suspicious intracranial vascular hyperdensity. No acute intracranial hemorrhage identified.  IMPRESSION: 1. No acute intracranial abnormality. No acute traumatic injury identified. 2. Stable mild nonspecific white matter changes.   Electronically Signed   By: Lars Pinks M.D.   On: 09/15/2013 13:11    Review of Systems  Musculoskeletal: Falls: fell at home today, hurt left ankle.  Prior fracture right knee area last summer with surgery in [place].  Uses walker normally.  Endo/Heme/Allergies:       History of right breast cancer, surgery and chemotherapy.  Doing well.    Blood pressure 115/64, pulse 99, temperature 98 F (36.7 C), resp. rate 20, SpO2 100.00%. Physical Exam  Constitutional: She is oriented to person, place, and time. She appears well-developed and well-nourished.  HENT:  Head: Normocephalic and atraumatic.  Eyes: EOM are normal. Pupils are equal, round, and reactive to light.  Neck: Normal range of motion. Neck supple.  Cardiovascular: Normal rate, regular rhythm and intact distal pulses.   Respiratory: Effort normal.  GI: Soft.  Musculoskeletal: She exhibits tenderness (pain, swelling, left ankle.  NV is intact.).       Left ankle: She exhibits decreased range of motion, swelling and deformity. Tenderness. Lateral malleolus tenderness found.       Feet:  Neurological: She is alert  and oriented to person, place, and time. She has normal reflexes.  Skin: Skin is warm and dry.  Psychiatric: She has a normal mood and affect. Her behavior is normal. Judgment and thought content normal.     Assessment/Plan Bimalleolar fracture of left ankle with lateral displacement and mortise widening.  For surgery of ankle later this afternoon.  Bohdi Leeds 09/15/2013, 1:53 PM

## 2013-09-15 NOTE — Brief Op Note (Signed)
09/15/2013  5:05 PM  PATIENT:  Elizabeth Orr  53 y.o. female  PRE-OPERATIVE DIAGNOSIS:  Bimalleolar fracture of left ankle with lateral displacement   POST-OPERATIVE DIAGNOSIS: same  PROCEDURE:  Procedure(s): OPEN REDUCTION INTERNAL FIXATION (ORIF) LEFTANKLE FRACTURE (Left)  SURGEON:  Surgeon(s) and Role:    * Sanjuana Kava, MD - Primary  PHYSICIAN ASSISTANT:   ASSISTANTS: none   ANESTHESIA:   general  EBL:  Total I/O In: 300 [I.V.:300] Out: 10 [Blood:10]  BLOOD ADMINISTERED:none  DRAINS: none   LOCAL MEDICATIONS USED:  NONE  SPECIMEN:  No Specimen  DISPOSITION OF SPECIMEN:  N/A  COUNTS:  YES  TOURNIQUET:   Total Tourniquet Time Documented: Thigh (Left) - 30 minutes Total: Thigh (Left) - 30 minutes   DICTATION: [id number]  PLAN OF CARE: Admit to inpatient   PATIENT DISPOSITION:  PACU - hemodynamically stable.   Delay start of Pharmacological VTE agent (>24hrs) due to surgical blood loss or risk of bleeding: no

## 2013-09-15 NOTE — OR Nursing (Signed)
Dr Luna Glasgow here to talk with patient to explain procedure and  Obtained consent for surgery.

## 2013-09-15 NOTE — ED Provider Notes (Signed)
CSN: [id number]     Arrival date & time 09/15/13  1109 History   First MD Initiated Contact with Patient 09/15/13 1146     Chief Complaint  Patient presents with  . Foot Injury   (Consider location/radiation/quality/duration/timing/severity/associated sxs/prior Treatment) Patient is a 53 y.o. female presenting with foot injury. The history is provided by the patient.  Foot Injury Location:  Ankle Time since incident:  1 hour Injury: yes   Ankle location:  L ankle Pain details:    Quality:  Aching and throbbing   Radiates to:  Does not radiate   Severity:  Severe   Onset quality:  Sudden   Duration:  1 week   Timing:  Constant   Progression:  Worsening Chronicity:  New Dislocation: no   Foreign body present:  No foreign bodies Prior injury to area:  No Relieved by:  Nothing Associated symptoms: back pain and decreased ROM   Associated symptoms: no neck pain     Past Medical History  Diagnosis Date  . Kidney stones 1989  . GERD (gastroesophageal reflux disease)   . Arthritis   . History of stomach ulcers 2003/2007    Hx of esophageal ulcers and stomach ulcers due to reflux  . Back pain   . Chronic leg pain   . Polysubstance abuse   . Depression   . Anxiety   . Exertional shortness of breath   . History of blood transfusion     "recently had my 2nd; related to chemo" (01/21/2013)  . XVQMGQQP(619.5)     "monthly" (01/21/2013)  . Breast cancer 06/08/12    right mastectomy  . Infiltrating ductal carcinoma of breast 07/09/2012    Neijstrom:  chemo   Past Surgical History  Procedure Laterality Date  . Esophagogastroduodenoscopy    . Esophageal dilation    . Portacath placement  07/22/2012    Procedure: INSERTION PORT-A-CATH;  Surgeon: Donato Heinz, MD;  Location: [facility];  Service: General;  Laterality: Left;  left subclavian  . Esophagogastroduodenoscopy (egd) with esophageal dilation  10/04/2005    Rourk-Extensive geographic ulcerations distal third of the tubular  esoophagus consistent with severe ulcerative relux esophagitis, early stricture formation status post dilation as described above. 2.  Multiple gastric ulcers without bleeding stigmata as described above. otherwise normal stomach.  3. large bulbar ulcer without bleeding stigmata. otherwise D1 and D2 appeared normal.  . Colonoscopy, esophagogastroduodenoscopy (egd) and esophageal dilation  08/21/2012    Procedure: COLONOSCOPY, ESOPHAGOGASTRODUODENOSCOPY (EGD) AND ESOPHAGEAL DILATION;  Surgeon: Daneil Dolin, MD;  Location[facility];  Service: Endoscopy;  Laterality: N/A;  9:30  . Breast biopsy Right 04/2012  . Mastectomy Right 06/08/12  . Tubal ligation  1989  . Femur im nail Right 01/21/2013    Procedure: INTRAMEDULLARY (IM) RETROGRADE FEMORAL NAILING;  Surgeon: Hessie Dibble, MD;  Location: [facility];  Service: Orthopedics;  Laterality: Right;   Family History  Problem Relation Age of Onset  . Heart attack Father   . COPD Father   . Alcohol abuse Brother   . Alcohol abuse Brother   . Diabetes Mother    History  Substance Use Topics  . Smoking status: Former Smoker -- 0.25 packs/day for 26 years    Quit date: 08/07/2011  . Smokeless tobacco: Never Used  . Alcohol Use: No     Comment: 01/21/2013 "drink ~ 6 pack beer/wk"   OB History   Grav Para Term Preterm Abortions TAB SAB Ect Mult Living  6 6        6      Review of Systems  Constitutional: Negative for activity change.       All ROS Neg except as noted in HPI  HENT: Negative for nosebleeds.   Eyes: Negative for photophobia and discharge.  Respiratory: Negative for cough, shortness of breath and wheezing.   Cardiovascular: Negative for chest pain and palpitations.  Gastrointestinal: Negative for abdominal pain and blood in stool.  Genitourinary: Negative for dysuria, frequency and hematuria.  Musculoskeletal: Positive for back pain. Negative for arthralgias and neck pain.  Skin: Negative.   Neurological: Negative for  dizziness, seizures and speech difficulty.  Psychiatric/Behavioral: Negative for hallucinations and confusion. The patient is nervous/anxious.        Depression    Allergies  Citalopram; Effexor; Motrin; and Trazodone and nefazodone  Home Medications   Current Outpatient Rx  Name  Route  Sig  Dispense  Refill  . albuterol (PROVENTIL HFA;VENTOLIN HFA) 108 (90 BASE) MCG/ACT inhaler   Inhalation   Inhale 2 puffs into the lungs every 6 (six) hours as needed for wheezing.   1 Inhaler   2   . gabapentin (NEURONTIN) 300 MG capsule      Take 900 mg in AM and 900 mg in PM   180 capsule   3   . iron polysaccharides (NIFEREX) 150 MG capsule   Oral   Take 1 capsule (150 mg total) by mouth daily.   30 capsule   5   . magnesium hydroxide (MILK OF MAGNESIA) 400 MG/5ML suspension   Oral   Take 30 mLs by mouth daily as needed for constipation.         Marland Kitchen omeprazole (PRILOSEC) 20 MG capsule   Oral   Take 1 capsule (20 mg total) by mouth daily.   30 capsule   2   . potassium chloride SA (K-DUR,KLOR-CON) 20 MEQ tablet   Oral   Take 1 tablet (20 mEq total) by mouth daily.   30 tablet   3   . sucralfate (CARAFATE) 1 GM/10ML suspension   Oral   Take 1 g by mouth 4 (four) times daily.         . tamoxifen (NOLVADEX) 20 MG tablet   Oral   Take 1 tablet (20 mg total) by mouth daily.   30 tablet   11    BP 115/64  Pulse 99  Temp(Src) 98 F (36.7 C)  Resp 20  SpO2 100% Physical Exam  Nursing note and vitals reviewed. Constitutional: She is oriented to person, place, and time. She appears well-developed and well-nourished.  Non-toxic appearance.  HENT:  Head: Normocephalic.  Right Ear: Tympanic membrane and external ear normal.  Left Ear: Tympanic membrane and external ear normal.  Small hematoma of the left for head and scalp area.  Eyes: EOM and lids are normal. Pupils are equal, round, and reactive to light.  Neck: Normal range of motion. Neck supple. Carotid bruit is  not present.  Cardiovascular: Normal rate, regular rhythm, normal heart sounds, intact distal pulses and normal pulses.   Pulmonary/Chest: Breath sounds normal. No respiratory distress.  Abdominal: Soft. Bowel sounds are normal. There is no tenderness. There is no guarding.  Musculoskeletal: Normal range of motion.  There is good range of motion of the left hip and knee. There is pain and swelling of the left ankle. Medial, dorsal, and lateral. The dorsalis pedis pulses 2+. There is full range of motion of the  toes of the left foot. No deformity or pain involving the right lower extremity.  Lymphadenopathy:       Head (right side): No submandibular adenopathy present.       Head (left side): No submandibular adenopathy present.    She has no cervical adenopathy.  Neurological: She is alert and oriented to person, place, and time. She has normal strength. No cranial nerve deficit or sensory deficit.  Skin: Skin is warm and dry.  Psychiatric: She has a normal mood and affect. Her speech is normal.    ED Course  Procedures (including critical care time) Labs Review Labs Reviewed - No data to display Imaging Review Dg Ankle Complete Left  09/15/2013   CLINICAL DATA:  Fall.  Pain and swelling.  EXAM: LEFT ANKLE COMPLETE - 3+ VIEW  COMPARISON:  None  FINDINGS: There is an oblique fracture of the distal fibula. There is an avulsion fracture the medial malleolus. There is pronounced widening of the ankle mortise. Posterior lip of the tibia appears intact.  IMPRESSION: Bimalleolar fracture with widening of the ankle mortise.   Electronically Signed   By: Nelson Chimes M.D.   On: 09/15/2013 11:37    EKG Interpretation   None       MDM  No diagnosis found. *I have reviewed nursing notes, vital signs, and all appropriate lab and imaging results for this patient.**  Test result given to the pateint. Pt treated with dilaudid and zofran in the ED.Case discussed with Dr Luna Glasgow. He will come to see  the patient. Pt moved to the acute area.  Lenox Ahr, PA-C 09/16/13 2332

## 2013-09-15 NOTE — Progress Notes (Signed)
Moves left toes without difficulty. Left toes pink in color/warm to touch. Left leg elevated on a pillow.

## 2013-09-15 NOTE — ED Notes (Signed)
To hold splinting until contacted by Ortho.

## 2013-09-16 ENCOUNTER — Inpatient Hospital Stay (HOSPITAL_COMMUNITY): Payer: Medicaid Other

## 2013-09-16 LAB — CBC WITH DIFFERENTIAL/PLATELET
BASOS ABS: 0 10*3/uL (ref 0.0–0.1)
Basophils Relative: 1 % (ref 0–1)
Eosinophils Absolute: 0.1 10*3/uL (ref 0.0–0.7)
Eosinophils Relative: 1 % (ref 0–5)
HCT: 29.8 % — ABNORMAL LOW (ref 36.0–46.0)
Hemoglobin: 9.6 g/dL — ABNORMAL LOW (ref 12.0–15.0)
LYMPHS PCT: 22 % (ref 12–46)
Lymphs Abs: 1.7 10*3/uL (ref 0.7–4.0)
MCH: 38.4 pg — ABNORMAL HIGH (ref 26.0–34.0)
MCHC: 32.2 g/dL (ref 30.0–36.0)
MCV: 119.2 fL — ABNORMAL HIGH (ref 78.0–100.0)
Monocytes Absolute: 0.7 10*3/uL (ref 0.1–1.0)
Monocytes Relative: 9 % (ref 3–12)
Neutro Abs: 5.5 10*3/uL (ref 1.7–7.7)
Neutrophils Relative %: 68 % (ref 43–77)
PLATELETS: 206 10*3/uL (ref 150–400)
RBC: 2.5 MIL/uL — AB (ref 3.87–5.11)
RDW: 19.5 % — AB (ref 11.5–15.5)
WBC: 8 10*3/uL (ref 4.0–10.5)

## 2013-09-16 LAB — BASIC METABOLIC PANEL
BUN: <3 mg/dL — ABNORMAL LOW (ref 6–23)
CALCIUM: 8.1 mg/dL — AB (ref 8.4–10.5)
CHLORIDE: 104 meq/L (ref 96–112)
CO2: 22 mEq/L (ref 19–32)
Creatinine, Ser: 0.46 mg/dL — ABNORMAL LOW (ref 0.50–1.10)
GFR calc Af Amer: >90 mL/min (ref >90)
GFR calc non Af Amer: >90 mL/min (ref >90)
Glucose, Bld: 103 mg/dL — ABNORMAL HIGH (ref 70–99)
Potassium: 3.7 mEq/L (ref 3.7–5.3)
SODIUM: 139 meq/L (ref 137–147)

## 2013-09-16 NOTE — Op Note (Signed)
Elizabeth Orr, Elizabeth Orr               ACCOUNT NO.:  [id number]  MEDICAL RECORD NO.:  [id number]  LOCATION:  [facility]403                          FACILITY:  [facility]  PHYSICIAN:  J. Sanjuana Kava, M.D. DATE OF BIRTH:  05-29-1961  DATE OF PROCEDURE: DATE OF DISCHARGE:                              OPERATIVE REPORT   PREOPERATIVE DIAGNOSIS:  Bimalleolar fracture of the right ankle with lateral displacement.  POSTOPERATIVE DIAGNOSIS:  Bimalleolar fracture of the right ankle with lateral displacement.  PROCEDURE: 1. Open reduction and internal fixation of lateral malleolus using a 5-     hole side plate, screws and repair of the medial collateral     ligament of the ankle primarily. 2. Application of posterior splint.  ANESTHESIA:  General.  TOURNIQUET TIME:  29 minutes.  DRAINS:  None.  SURGEON:  J. Sanjuana Kava, M.D.  INDICATIONS:  The patient fell at her home today and sustained the above- mentioned injury.  There were no other injury.  The patient has been on a walker for some time after having a right lower extremity surgery last mid summer and she was in a wheelchair for a while and has recently progressed to a walker.  Door bell was ranging.  She went to check the door and somehow another foot tripped on something and she fell while using the walker.  I discussed with the patient the findings of the x-ray and the planned procedure.  She asked appropriate questions, appeared to understand and agreed to the procedure as outlined.  DESCRIPTION OF PROCEDURE:  The patient was seen in the holding area and identified the left ankle as the correct surgical site.  I placed a mark on the left ankle.  The patient was brought to the operating room, given spinal anesthesia while supine.  Tourniquet was placed and deflated left upper thigh.  The patient was prepped and draped in usual manner.  A generalized time-out identifying the patient as Elizabeth Orr, identifying the left ankle as the  correct surgical site.  All instrumentations were properly positioned and working.  Everyone in the room knew each other.  Leg was elevated, wrapped circumferentially with Esmarch bandage. Tourniquet was inflated to 300 mmHg.  Esmarch bandage was removed. Incision was made medially.  The medial deltoid ligament was examined and had a tear.  There was a very small bimalleolar fracture and very small fragment that was removed.  The medial deltoid collateral ligament was then repaired using interrupted figure-of-eight #1 Surgilon suture. After all the sutures were applied, then they were tightened and gave a good repair.  Mortise was returned to normal.  Attention was directed to the lateral side.  Incision was made.  The fracture was identified, cleaned of hematoma and held in place with a serrated clamp.  A 5-hole side plate was selected.  Drill holes were made.  The most inferior screw was a cancellous 18-mm and then a 20-mm cortical and then a 16-mm cortical and then a 14-mm locking and then a 14-mm cancellous screw on the 5-hole side plate.  X-ray was taken, had excellent position and alignment.  Wound was then reapproximated using 2-0 chromic, 2-0 plain and skin staples laterally  and medially, 2-0 plain and skin staples were used.  Sterile dressing was applied.  Tourniquet was deflated for 29 minutes.  A posterior splint applied after applying a sterile dressing. The patient tolerated the procedure well, will go to recovery in good condition, she was admitted.          ______________________________ Lenna Sciara. Sanjuana Kava, M.D.     JWK/MEDQ  D:  09/15/2013  T:  09/16/2013  Job:  [id number]

## 2013-09-16 NOTE — Evaluation (Signed)
Physical Therapy Evaluation Patient Details Name: Elizabeth Orr MRN: [id number] DOB: 02-11-1961 Today's Date: 09/16/2013 Time: 0850-1020 PT Time Calculation (min): 90 min  PT Assessment / Plan / Recommendation History of Present Illness  Pt is admitted with a fx of the left ankle and underwent OTIF on 09-15-13.  She has just finished chemotherapy for treatement of breast CA and has also been recuperating from a fracture to the right LE sustained last year.  She had just begun trying to ambulate with a walker placing TTWB on the right when she fell and broke her left ankle.  Her mobility has been severely impaired during the past 2 years per husbands report.  This is compounded by morbid obesity.  Clinical Impression   Pt was seen for evaluation.  She was alert and cooperative.   Husband was present during visit and is adamant that pt return home with him at d/c.  He is working in [place], [place] at this time and is hoping to transport her there at the time of d/c.  Pt is deconditioned at baseline and her mobility is now severely impaired.  She was able to transfer bed to chair with a sliding board, but transfer was very labored.  Gait for her is not possible because of weight bearing restrictions bilaterally.  To function at home she will need full time assistance, a sliding board and BSC with drop down armrest.  She would benefit from having a hospital bed in order to facilitate transfer using a sliding board (transfer heights must be equal).  I am not sure if she is capable of transferring into her husband's truck and tolerating the 7-8 hour drive.  We discussed pt positioning in the truck in order to maintain elevation of the left LE and reminded husband that she will need to empty her bladder during the trip.  He feels that he can resolve these problems.  In my opinion, pt would benefit from short term SNF but husband will not hear of it.  We will work with her in house to maximize her mobility.    PT  Assessment  Patient needs continued PT services    Follow Up Recommendations  No PT follow up (husband refuses SNF)    Does the patient have the potential to tolerate intense rehabilitation      Barriers to Discharge Inaccessible home environment      [facility] bed;Other (comment) (sliding board, BSC with drop down armrest, trapeze for bed)    Recommendations for Other Services     Frequency 7X/week    Precautions / Restrictions Precautions Precautions: Fall Restrictions Weight Bearing Restrictions: Yes RLE Weight Bearing: Partial weight bearing LLE Weight Bearing: Touchdown weight bearing   Pertinent Vitals/Pain       Mobility  Bed Mobility Overal bed mobility: Needs Assistance Bed Mobility: Sidelying to Sit Sidelying to sit: Min guard;HOB elevated Transfers Overall transfer level: Needs assistance Equipment used:  (sliding board) Transfers: Lateral/Scoot Transfers  Lateral/Scoot Transfers: Min guard General transfer comment: pt was instructed in sliding board transfer..transfer was very labored but she did not need any physical assistance...bed height was level with w/c seat    Exercises     PT Diagnosis: Difficulty walking;Generalized weakness;Acute pain  PT Problem List: Decreased strength;Decreased activity tolerance;Decreased mobility;Obesity;Pain PT Treatment Interventions: DME instruction;Functional mobility training;Patient/family education     PT Goals(Current goals can be found in the care plan section) Acute Rehab PT Goals Patient Stated Goal: none stated PT Goal  Formulation: With patient/family Time For Goal Achievement: 10/30/13 Potential to Achieve Goals: Good  Visit Information  Last PT Received On: 09/16/13 History of Present Illness: Pt is admitted with a fx of the left ankle and underwent OTIF on 09-15-13.  She has just finished chemotherapy for treatement of breast CA and has also been recuperating from a fracture  to the right LE sustained last year.  She had just begun trying to ambulate with a walker placing TTWB on the right when she fell and broke her left ankle.  Her mobility has been severely impaired during the past 2 years per husbands report.  This is compounded by morbid obesity.       Prior [facility] expects to be discharged to:: Private residence Living Arrangements: Spouse/significant other;Other relatives Available Help at Discharge: Family;Available 24 hours/day Home Access: Stairs to enter Entrance Stairs-Rails: Right Home Layout: One level Home Equipment: Walker - 2 wheels;Bedside commode;Wheelchair - manual Prior Function Level of Independence: Needs assistance Gait / Transfers Assistance Needed: minimally ambulatory with a walker, transferred independently ADL's / Homemaking Assistance Needed: full assist with all ADLs Communication Communication: No difficulties    Cognition  Cognition Arousal/Alertness: Awake/alert Behavior During Therapy: WFL for tasks assessed/performed Overall Cognitive Status: Within Functional Limits for tasks assessed    Extremity/Trunk Assessment Lower Extremity Assessment Lower Extremity Assessment: RLE deficits/detail;LLE deficits/detail RLE Deficits / Details: strength generally 3/5 LLE Deficits / Details: pt has at least 3/5 strength LLE: Unable to fully assess due to pain Cervical / Trunk Assessment Cervical / Trunk Assessment: Normal   Balance Balance Overall balance assessment: No apparent balance deficits (not formally assessed)  End of Session PT - End of Session Activity Tolerance: Patient limited by fatigue;Patient limited by pain Patient left: in bed;with call bell/phone within reach;with bed alarm set;with family/visitor present Nurse Communication: Mobility status  GP     Demetrios Isaacs L 09/16/2013, 11:40 AM

## 2013-09-16 NOTE — Care Management Note (Unsigned)
    Page 1 of 1   09/16/2013     1:38:35 PM   CARE MANAGEMENT NOTE 09/16/2013  Patient:  Elizabeth Orr, Elizabeth Orr   Account Number:  [id number]  Date Initiated:  09/16/2013  Documentation initiated by:  Claretha Cooper  Subjective/Objective Assessment:   Pt from home. PT spoke iwth pt and spouse. Spouse is an Clinical biochemist and currently working on a job in [place]. Pt will be alone or go to [place] with spouse at DC.     Action/Plan:   Since this was a fracture, [facility] states that [facility] will pay for some HH PT. Spouse seemed adamant to PT that he would not consider placement.   Anticipated DC Date:     Anticipated DC Plan:  [facility] referral  Clinical Social Worker      DC Planning Services  CM consult      Choice offered to / List presented to:          [facility] arranged  HH-1 RN  [place].   Status of service:  In process, will continue to follow Medicare Important Message given?   (If response is "NO", the following Medicare IM given date fields will be blank) Date Medicare IM given:   Date Additional Medicare IM given:    Discharge Disposition:    Per UR Regulation:    If discussed at Long Length of Stay Meetings, dates discussed:    Comments:  09/16/13 Claretha Cooper RN BSN CM

## 2013-09-16 NOTE — Progress Notes (Signed)
Physical Therapy Treatment Patient Details Name: Elizabeth Orr MRN: [id number] DOB: February 08, 1961 Today's Date: 09/16/2013 Time: 1531-1610 PT Time Calculation (min): 39 min  PT Assessment / Plan / Recommendation  History of Present Illness Pt is complaining of continue "itching" systemically but pain is fairly well controlled.  Husband is waiting to talk with Dr. Luna Orr to decide about a d/c plan.   PT Comments   Pt was instructed in sliding board transfers.  She is physically able to perform this transfer with min assist in a controlled environment.   During the visit pt became suddenly very confused and agitated.  We calmed her down and RN was alerted.   Follow Up Recommendations  Home health PT (if approved by insurance)     Does the patient have the potential to tolerate intense rehabilitation     Barriers to Discharge        Equipment Recommendations       Recommendations for Other Services    Frequency     Progress towards PT Goals Progress towards PT goals: Progressing toward goals  Plan Current plan remains appropriate    Precautions / Restrictions     Pertinent Vitals/Pain     Mobility  Bed Mobility Sidelying to sit: Min guard Transfers General transfer comment: pt was again instructed in a sliding board transfer and practiced x 4 repetitions.   Assistance to place the board and remove it was needed    Exercises     PT Diagnosis:    PT Problem List:   PT Treatment Interventions:     PT Goals (current goals can now be found in the care plan section)    Visit Information  Last PT Received On: 09/16/13 History of Present Illness: Pt is complaining of continue "itching" systemically but pain is fairly well controlled.  Husband is waiting to talk with Dr. Luna Orr to decide about a d/c plan.    Subjective Data      Cognition       Balance     End of Session PT - End of Session Activity Tolerance: Patient tolerated treatment well Patient left: in  chair;with call bell/phone within reach;with family/visitor present Nurse Communication: Mobility status   GP     Elizabeth Orr 09/16/2013, 5:32 PM

## 2013-09-16 NOTE — Progress Notes (Signed)
Utilization Review Complete  

## 2013-09-16 NOTE — Anesthesia Postprocedure Evaluation (Signed)
  Anesthesia Post-op Note  Patient: Elizabeth Orr  Procedure(s) Performed: Procedure(s): OPEN REDUCTION INTERNAL FIXATION (ORIF) LEFTANKLE FRACTURE (Left)  Patient Location: Room 318  Anesthesia Type:General  Level of Consciousness: awake, alert , oriented and patient cooperative  Airway and Oxygen Therapy: Patient Spontanous Breathing  Post-op Pain: mild  Post-op Assessment: Post-op Vital signs reviewed, Patient's Cardiovascular Status Stable, Respiratory Function Stable, Patent Airway and Pain level controlled  Post-op Vital Signs: Reviewed and stable  Complications: No apparent anesthesia complications

## 2013-09-16 NOTE — Progress Notes (Signed)
Subjective: 1 Day Post-Op Procedure(s) (LRB): OPEN REDUCTION INTERNAL FIXATION (ORIF) LEFTANKLE FRACTURE (Left) Patient reports pain as 5 on 0-10 scale.    Objective: Vital signs in last 24 hours: Temp:  [98 F (36.7 C)-98.7 F (37.1 C)] 98.7 F (37.1 C) (01/29 0441) Pulse Rate:  [99-120] 103 (01/29 0441) Resp:  [13-21] 20 (01/29 0441) BP: (111-170)/(58-116) 121/68 mmHg (01/29 0441) SpO2:  [92 %-100 %] 99 % (01/29 0613) Weight:  [97.796 kg (215 lb 9.6 oz)] 97.796 kg (215 lb 9.6 oz) (01/28 2026)  Intake/Output from previous day: 01/28 0701 - 01/29 0700 In: 740 [P.O.:240; I.V.:500] Out: 360 [Urine:350; Blood:10] Intake/Output this shift:     Recent Labs  09/15/13 1255 09/16/13 0521  HGB 9.6* 9.6*    Recent Labs  09/15/13 1255 09/16/13 0521  WBC 7.6 8.0  RBC 2.53* 2.50*  HCT 29.6* 29.8*  PLT 216 206    Recent Labs  09/15/13 1255 09/16/13 0521  NA 135* 139  K 3.3* 3.7  CL 99 104  CO2 18* 22  BUN <3* <3*  CREATININE 0.41* 0.46*  GLUCOSE 102* 103*  CALCIUM 8.1* 8.1*   No results found for this basename: LABPT, INR,  in the last 72 hours  Neurologically intact Neurovascular intact Sensation intact distally Intact pulses distally  She had a good night.  Pain controlled.  To begin PT today.  To arrange for home health post discharge, hopefully tomorrow.  Assessment/Plan: 1 Day Post-Op Procedure(s) (LRB): OPEN REDUCTION INTERNAL FIXATION (ORIF) LEFTANKLE FRACTURE (Left) Up with therapy  Elizabeth Orr 09/16/2013, 7:10 AM

## 2013-09-17 LAB — CBC WITH DIFFERENTIAL/PLATELET
BASOS ABS: 0 10*3/uL (ref 0.0–0.1)
Basophils Relative: 0 % (ref 0–1)
Eosinophils Absolute: 0 10*3/uL (ref 0.0–0.7)
Eosinophils Relative: 0 % (ref 0–5)
HEMATOCRIT: 28.9 % — AB (ref 36.0–46.0)
HEMOGLOBIN: 9.3 g/dL — AB (ref 12.0–15.0)
LYMPHS PCT: 8 % — AB (ref 12–46)
Lymphs Abs: 1.1 10*3/uL (ref 0.7–4.0)
MCH: 38.8 pg — ABNORMAL HIGH (ref 26.0–34.0)
MCHC: 32.2 g/dL (ref 30.0–36.0)
MCV: 120.4 fL — ABNORMAL HIGH (ref 78.0–100.0)
MONO ABS: 0.7 10*3/uL (ref 0.1–1.0)
MONOS PCT: 5 % (ref 3–12)
NEUTROS ABS: 11.5 10*3/uL — AB (ref 1.7–7.7)
Neutrophils Relative %: 86 % — ABNORMAL HIGH (ref 43–77)
Platelets: 196 10*3/uL (ref 150–400)
RBC: 2.4 MIL/uL — ABNORMAL LOW (ref 3.87–5.11)
RDW: 19.2 % — ABNORMAL HIGH (ref 11.5–15.5)
WBC: 13.3 10*3/uL — AB (ref 4.0–10.5)

## 2013-09-17 MED ORDER — OXYCODONE-ACETAMINOPHEN 5-325 MG PO TABS
1.0000 | ORAL_TABLET | ORAL | Status: DC | PRN
  Administered 2013-09-17 – 2013-09-20 (×13): 1 via ORAL
  Filled 2013-09-17 (×13): qty 1

## 2013-09-17 MED ORDER — MORPHINE SULFATE 2 MG/ML IJ SOLN
2.0000 mg | INTRAMUSCULAR | Status: DC | PRN
  Administered 2013-09-18 (×2): 2 mg via INTRAVENOUS
  Filled 2013-09-17 (×2): qty 1

## 2013-09-17 NOTE — Progress Notes (Signed)
Subjective: 2 Days Post-Op Procedure(s) (LRB): OPEN REDUCTION INTERNAL FIXATION (ORIF) LEFTANKLE FRACTURE (Left) Patient reports pain as 4 on 0-10 scale.    Objective: Vital signs in last 24 hours: Temp:  [98.5 F (36.9 C)-99.8 F (37.7 C)] 99.8 F (37.7 C) (01/30 6962) Pulse Rate:  [101-115] 109 (01/30 0632) Resp:  [11-20] 20 (01/30 9528) BP: (105-127)/(64-73) 119/73 mmHg (01/30 0632) SpO2:  [96 %-100 %] 96 % (01/30 4132)  Intake/Output from previous day: 01/29 0701 - 01/30 0700 In: 1725 [P.O.:680; I.V.:1045] Out: 300 [Urine:300] Intake/Output this shift:     Recent Labs  09/15/13 1255 09/16/13 0521 09/17/13 0440  HGB 9.6* 9.6* 9.3*    Recent Labs  09/16/13 0521 09/17/13 0440  WBC 8.0 13.3*  RBC 2.50* 2.40*  HCT 29.8* 28.9*  PLT 206 196    Recent Labs  09/15/13 1255 09/16/13 0521  NA 135* 139  K 3.3* 3.7  CL 99 104  CO2 18* 22  BUN <3* <3*  CREATININE 0.41* 0.46*  GLUCOSE 102* 103*  CALCIUM 8.1* 8.1*   No results found for this basename: LABPT, INR,  in the last 72 hours  Neurologically intact Neurovascular intact Sensation intact distally Intact pulses distally  She did only fair in physical therapy.  She had a fracture of the right femur last June.  Just two weeks ago she was allowed to put partial weight with the walker.  She was having a hard time using the walker.  She fell at home due to poor walker control and sustained this new ankle fracture.  She is in a difficult state here.  Her husband works in [place] and has to go back there to finish out a Soil scientist.  He cannot be at her home.  She has no one else at home to help.  She was having difficulty recently.  She has no family doctor.  She has no one to care for her.  On this basis, I do not feel it would be safe or prudent for her to be discharged to her home with no support or help.  I will put in request for SNF bed.  She needs PT, and basic care.  Hopefully after several weeks she can be  better with the right femur to put weight on the right leg more and get her strength back.  She reluctantly understands.  She had been saying she wanted to go home and had support there, but after talking to her husband this morning, she obviously does not.  She was resisting SNF placement but now agrees it is in her best interest.  Assessment/Plan: 2 Days Post-Op Procedure(s) (LRB): OPEN REDUCTION INTERNAL FIXATION (ORIF) LEFTANKLE FRACTURE (Left) Discharge to SNF when bed available.  Elizabeth Orr 09/17/2013, 7:27 AM

## 2013-09-17 NOTE — Evaluation (Signed)
Clinical/Bedside Swallow Evaluation Patient Details  Name: MANE CONSOLO MRN: [id number] Date of Birth: 1960-12-31  Today's Date: 09/17/2013 Time: 8938-1017 SLP Time Calculation (min): 20 min  Past Medical History:  Past Medical History  Diagnosis Date  . Kidney stones 1989  . GERD (gastroesophageal reflux disease)   . History of stomach ulcers 2003/2007    Hx of esophageal ulcers and stomach ulcers due to reflux  . Back pain   . Chronic leg pain   . Polysubstance abuse   . Depression   . Anxiety   . Exertional shortness of breath   . History of blood transfusion     "recently had my 2nd; related to chemo" (01/21/2013)  . PZWCHENI(778.2)     "monthly" (01/21/2013)  . Breast cancer 06/08/12    right mastectomy  . Infiltrating ductal carcinoma of breast 07/09/2012    [place]:  chemo   Past Surgical History:  Past Surgical History  Procedure Laterality Date  . Esophagogastroduodenoscopy    . Esophageal dilation    . Portacath placement  07/22/2012    Procedure: INSERTION PORT-A-CATH;  Surgeon: Donato Heinz, MD;  Location: [facility];  Service: General;  Laterality: Left;  left subclavian  . Esophagogastroduodenoscopy (egd) with esophageal dilation  10/04/2005    Rourk-Extensive geographic ulcerations distal third of the tubular esoophagus consistent with severe ulcerative relux esophagitis, early stricture formation status post dilation as described above. 2.  Multiple gastric ulcers without bleeding stigmata as described above. otherwise normal stomach.  3. large bulbar ulcer without bleeding stigmata. otherwise D1 and D2 appeared normal.  . Colonoscopy, esophagogastroduodenoscopy (egd) and esophageal dilation  08/21/2012    Procedure: COLONOSCOPY, ESOPHAGOGASTRODUODENOSCOPY (EGD) AND ESOPHAGEAL DILATION;  Surgeon: Daneil Dolin, MD;  Location: [facility];  Service: Endoscopy;  Laterality: N/A;  9:30  . Breast biopsy Right 04/2012  . Mastectomy Right 06/08/12  . Tubal ligation   1989  . Femur im nail Right 01/21/2013    Procedure: INTRAMEDULLARY (IM) RETROGRADE FEMORAL NAILING;  Surgeon: Hessie Dibble, MD;  Location: [facility];  Service: Orthopedics;  Laterality: Right;   HPI:  Pt is admitted with a fx of the left ankle and underwent OTIF on 09-15-13.  She has just finished chemotherapy for treatement of breast CA and has also been recuperating from a fracture to the right LE sustained last year.  She had just begun trying to ambulate with a walker placing TTWB on the right when she fell and broke her left ankle.  Her mobility has been severely impaired during the past 2 years per husbands report.  This is compounded by morbid obesity.    Assessment / Plan / Recommendation Clinical Impression  Pt seen in room today for BSE. Session split into two parts d/t pt needing to use restroom. Oral mechanism examination yielded missing dentition, but no other concerning abnormalities. Pt demonstrated WFL swallow of thin liquids as well as solids. However, she reported that she occasionally has trouble "getting meat down," and this happens ~2x/week. She states that she feels it is time for another esophageal dilation. SLP to dowgrade pt to mechanical soft diet for softer meat consistency with GI referrals as indicated.     Aspiration Risk  Mild    Diet Recommendation Dysphagia 3 (Mechanical Soft);Thin liquid   Liquid Administration via: Straw;Cup Medication Administration: Whole meds with liquid Supervision: Patient able to self feed Compensations: Slow rate;Small sips/bites;Follow solids with liquid Postural Changes and/or Swallow Maneuvers: Seated upright 90 degrees;Upright 30-60  min after meal    Other  Recommendations Recommended Consults: Consider esophageal assessment Oral Care Recommendations: Oral care BID   Follow Up Recommendations       Frequency and Duration min 1 x/week  1 week     Swallow Study    General Date of Onset: 09/17/13 HPI: Pt is admitted with a  fx of the left ankle and underwent OTIF on 09-15-13.  She has just finished chemotherapy for treatement of breast CA and has also been recuperating from a fracture to the right LE sustained last year.  She had just begun trying to ambulate with a walker placing TTWB on the right when she fell and broke her left ankle.  Her mobility has been severely impaired during the past 2 years per husbands report.  This is compounded by morbid obesity.  Type of Study: Bedside swallow evaluation Diet Prior to this Study: Regular;Thin liquids Temperature Spikes Noted: No Respiratory Status: Nasal cannula History of Recent Intubation: No Behavior/Cognition: Alert;Cooperative;Pleasant mood Oral Cavity - Dentition: Missing dentition Self-Feeding Abilities: Able to feed self Patient Positioning: Upright in chair Baseline Vocal Quality: Clear Volitional Cough: Weak Volitional Swallow: Able to elicit    Oral/Motor/Sensory Function Overall Oral Motor/Sensory Function: Appears within functional limits for tasks assessed   Ice Chips Ice chips: Not tested   Thin Liquid Thin Liquid: Within functional limits Presentation: Self Fed;Straw    Nectar Thick     Honey Thick     Puree     Solid   GO    Solid: Within functional limits Presentation: Self Fed       Tammara Massing S 09/17/2013,12:28 PM

## 2013-09-17 NOTE — Clinical Social Work Placement (Signed)
Clinical Social Work Department CLINICAL SOCIAL WORK PLACEMENT NOTE 09/17/2013  Patient:  EMELEE, RODOCKER  Account Number:  [id number] Admit date:  09/15/2013  Clinical Social Worker:  Benay Pike, LCSW  Date/time:  09/17/2013 03:36 PM  Clinical Social Work is seeking post-discharge placement for this patient at the following level of care:   [place]   (*CSW will update this form in Epic as items are completed)   09/17/2013  Patient/family provided with [facility] Department of Clinical Social Work's list of facilities offering this level of care within the geographic area requested by the patient (or if unable, by the patient's family).  09/17/2013  Patient/family informed of their freedom to choose among providers that offer the needed level of care, that participate in Medicare, Medicaid or managed care program needed by the patient, have an available bed and are willing to accept the patient.  09/17/2013  Patient/family informed of [facility]' ownership interest in [facility], as well as of the fact that they are under no obligation to receive care at this facility.  PASARR submitted to EDS on 09/17/2013 PASARR number received from [facility]DS on 09/17/2013  FL2 transmitted to all facilities in geographic area requested by pt/family on  09/17/2013 FL2 transmitted to all facilities within larger geographic area on 09/17/2013  Patient informed that his/her managed care company has contracts with or will negotiate with  certain facilities, including the following:     Patient/family informed of bed offers received:   Patient chooses bed at  Physician recommends and patient chooses bed at    Patient to be transferred to  on   Patient to be transferred to facility by   The following physician request were entered in Epic:   Additional Comments:  Benay Pike, [place]

## 2013-09-17 NOTE — ED Provider Notes (Signed)
Medical screening examination/treatment/procedure(s) were performed by non-physician practitioner and as supervising physician I was immediately available for consultation/collaboration.      Mervin Kung, MD 09/17/13 [phone]

## 2013-09-17 NOTE — Progress Notes (Signed)
Physical Therapy Treatment Patient Details Name: Elizabeth Orr MRN: [id number] DOB: [birth year] Today's Date: 09/17/2013 Time: 0160-1093 PT Time Calculation (min): 42 min  PT Assessment / Plan / Recommendation  History of Present Illness Husband is not present in the room today.  I had an opportunity to speak with pt alone and she stated that she was allowed to fully weight bear on her RLE.  This totally contradicted the information her husband gave me yesterday...stated that he leg had not healed properly and that she might need more surgery on it and was currently just minimally weight bearing on the RLE.  I called Dr. Jerald Kief PA , Joneen Caraway, who stated that pt had NO weight bearing restrictions on the RLE.   PT Comments   Pt has significant weakness in the RLE when considering she will now need to carry almost all of her body weight on her RLE (only TTWB allowed on LLE).  She is unable to come to stance on the RLE and needs total assist x1 to complete a pivot transfer.  A sliding board is still the safest means of transfer for her but at least PT can now work with her on developing increased RLE strength and use of a walker.  She will probably not be ambulatory until she is able to be weight bearing on the LLE.  Follow Up Recommendations  SNF;Home health PT (husband now agreeable to SNF)     Does the patient have the potential to tolerate intense rehabilitation     Barriers to Discharge        Equipment Recommendations       Recommendations for Other Services    Frequency     Progress towards PT Goals    Plan Discharge plan needs to be updated    Precautions / Restrictions Restrictions Weight Bearing Restrictions: Yes RLE Weight Bearing: Weight bearing as tolerated RLE Partial Weight Bearing Percentage or Pounds: no weight bearing restrictions per Dr. Latanya Maudlin LLE Weight Bearing: Touchdown weight bearing   Pertinent Vitals/Pain     Mobility  Bed Mobility Sidelying to sit:  Supervision;HOB elevated General bed mobility comments: pt is able to move both LE's in the bed without assist Transfers Transfers: Stand Pivot Transfers Stand pivot transfers: Total assist General transfer comment: since pt is now known to be WBAT on the right, we began attempting to stand on the RLE.   She was unable to come to stance but she was able to pivot on the RLE for bed to chair transfer....for safe transfers a sliding board is still appropriate    Exercises     PT Diagnosis:    PT Problem List:   PT Treatment Interventions:     PT Goals (current goals can now be found in the care plan section)    Visit Information  Last PT Received On: 09/17/13 History of Present Illness: Husband is not present in the room today.  I had an opportunity to speak with pt alone and she stated that she was allowed to fully weight bear on her RLE.  This totally contradicted the information her husband gave me yesterday...stated that he leg had not healed properly and that she might need more surgery on it and was currently just minimally weight bearing on the RLE.  I called Dr. Jerald Kief PA , Joneen Caraway, who stated that pt had NO weight bearing restrictions on the RLE.    Subjective Data      Cognition  Balance     End of Session PT - End of Session Equipment Utilized During Treatment: Gait belt Activity Tolerance: Patient tolerated treatment well Patient left: in chair;with call bell/phone within reach Nurse Communication: Mobility status   GP     Sable Feil 09/17/2013, 12:20 PM

## 2013-09-17 NOTE — Clinical Social Work Psychosocial (Signed)
Clinical Social Work Department BRIEF PSYCHOSOCIAL ASSESSMENT 09/17/2013  Patient:  Elizabeth Orr, Elizabeth Orr     Account Number:  [id number]     Admit date:  09/15/2013  Clinical Social Worker:  Wyatt Haste  Date/Time:  09/17/2013 03:39 PM  Referred by:  Physician  Date Referred:  09/17/2013 Referred for  SNF Placement   Other Referral:   Interview type:  Patient Other interview type:   husband    PSYCHOSOCIAL DATA Living Status:  FAMILY Admitted from facility:   Level of care:   Primary support name:   Primary support relationship to patient:  SPOUSE Degree of support available:   supportive per pt    CURRENT CONCERNS Current Concerns  Post-Acute Placement   Other Concerns:    SOCIAL WORK ASSESSMENT / PLAN CSW met with pt and pt's husband at bedside. Pt alert and oriented and reports she lives at home with her daughter-in-law most recently as her husband has been working in [place], [place] as an Clinical biochemist. She has 6 children who are all grown. Pt was evaluated yesterday by PT and husband refused to consider SNF. He stated he would quit his job and stay here in order for pt to return home. Today, after discussion with MD he is willing to consider. CSW discussed SNF with pt and husband. They were initially agreeable, but requested [place]. Made aware that pt most likely would need to go further away due to insurance. Pt has breast and cervical Medicaid per [facility]. She received this when she was diagnosed with breast cancer. Pt would need to apply for long term Medicaid for SNF. She applied for disability several years ago and was denied per husband. Pt has recently applied again and is awaiting determination. She fractured her ankle this week after a fall at home. CSW spoke with supervisor who agreed to LOG if needed. Initiated bed search and notified pt and husband that no bed offers were made here in [place], [place], [place] or [place] counties. Discussed  possibility of going further away including [place] or [place]. They were not overly interested in anything even in surrounding counties, much less this far away. Pt's husband asked about home health and equipment which CM checked on and reported that this could be provided. With this news, pt has decided to return home with assist from husband. He has canceled his plans to return to [place] and will stay with pt. MD notified of plan.   Assessment/plan status:  Referral to [employer] Other assessment/ plan:   Information/referral to community resources:   SNF list  CM for home health/equipment    PATIENT'S/FAMILY'S RESPONSE TO PLAN OF CARE: Pt and husband have made decision to return home as they did not receive bed offer close enough for them. Husband was not willing to travel further in order to see pt at SNF. They feel it will be easier to manage at home. CSW will sign off but can be reconsulted if needed.       Benay Pike, [place]

## 2013-09-18 LAB — CBC WITH DIFFERENTIAL/PLATELET
BASOS PCT: 0 % (ref 0–1)
Basophils Absolute: 0 10*3/uL (ref 0.0–0.1)
EOS ABS: 0.1 10*3/uL (ref 0.0–0.7)
Eosinophils Relative: 1 % (ref 0–5)
HCT: 26.9 % — ABNORMAL LOW (ref 36.0–46.0)
HEMOGLOBIN: 8.9 g/dL — AB (ref 12.0–15.0)
LYMPHS PCT: 8 % — AB (ref 12–46)
Lymphs Abs: 1.2 10*3/uL (ref 0.7–4.0)
MCH: 39 pg — AB (ref 26.0–34.0)
MCHC: 33.1 g/dL (ref 30.0–36.0)
MCV: 118 fL — ABNORMAL HIGH (ref 78.0–100.0)
Monocytes Absolute: 0.6 10*3/uL (ref 0.1–1.0)
Monocytes Relative: 4 % (ref 3–12)
NEUTROS ABS: 12.7 10*3/uL — AB (ref 1.7–7.7)
Neutrophils Relative %: 87 % — ABNORMAL HIGH (ref 43–77)
Platelets: 178 10*3/uL (ref 150–400)
RBC: 2.28 MIL/uL — ABNORMAL LOW (ref 3.87–5.11)
RDW: 19.7 % — ABNORMAL HIGH (ref 11.5–15.5)
WBC: 14.6 10*3/uL — ABNORMAL HIGH (ref 4.0–10.5)

## 2013-09-18 MED ORDER — MENTHOL 3 MG MT LOZG
1.0000 | LOZENGE | OROMUCOSAL | Status: DC | PRN
  Administered 2013-09-18: 3 mg via ORAL
  Filled 2013-09-18 (×2): qty 9

## 2013-09-18 NOTE — Progress Notes (Signed)
Subjective: 3 Days Post-Op Procedure(s) (LRB): OPEN REDUCTION INTERNAL FIXATION (ORIF) LEFTANKLE FRACTURE (Left) Patient reports pain as moderate.    Objective: Vital signs in last 24 hours: Temp:  [97.6 F (36.4 C)-98.4 F (36.9 C)] 98.4 F (36.9 C) (01/31 0530) Pulse Rate:  [117-122] 122 (01/31 0530) Resp:  [20] 20 (01/31 0530) BP: (112-123)/(64-77) 112/64 mmHg (01/31 0530) SpO2:  [94 %-99 %] 98 % (01/31 0807) FiO2 (%):  [21 %] 21 % (01/31 0807)  Intake/Output from previous day: 01/30 0701 - 01/31 0700 In: 2440 [I.V.:2440] Out: -  Intake/Output this shift: Total I/O In: -  Out: 300 [Urine:300]   Recent Labs  09/15/13 1255 09/16/13 0521 09/17/13 0440 09/18/13 0704  HGB 9.6* 9.6* 9.3* 8.9*    Recent Labs  09/17/13 0440 09/18/13 0704  WBC 13.3* 14.6*  RBC 2.40* 2.28*  HCT 28.9* 26.9*  PLT 196 178    Recent Labs  09/15/13 1255 09/16/13 0521  NA 135* 139  K 3.3* 3.7  CL 99 104  CO2 18* 22  BUN <3* <3*  CREATININE 0.41* 0.46*  GLUCOSE 102* 103*  CALCIUM 8.1* 8.1*   No results found for this basename: LABPT, INR,  in the last 72 hours  Neurologically intact splint intact   Assessment/Plan: 3 Days Post-Op Procedure(s) (LRB): OPEN REDUCTION INTERNAL FIXATION (ORIF) LEFTANKLE FRACTURE (Left) Up with therapy Will be here thru Monday   [facility] 09/18/2013, 10:31 AM

## 2013-09-18 NOTE — Progress Notes (Signed)
Physical Therapy Treatment Patient Details Name: Elizabeth Orr MRN: [id number] DOB: [birth year] Today's Date: 09/18/2013 Time: 9767-3419 PT Time Calculation (min): 27 min  PT Assessment / Plan / Recommendation     PT Comments   Husband stated that she is not to put any weight through RLE due to pins/screws in knee.  Worked on sliding board transfers to/from bed to chair.  Pt requires mod assistance with noted fatigue during transfers.  Takes increased time due to weakness and assistance with board placement.  Pt emotional today with inablity to mobilize as wanted.  Pt declined therex today as she was too exhaused.  Encouraged patient to perform exercises this afternoon and verbally agreed.   Follow Up Recommendations   (husband now agreeable to SNF)     Equipment Recommendations   (sliding board)    Progress towards PT Goals  progressing  Plan Discharge plan needs to be updated; need official WBS of RLE from MD.   Precautions / Restrictions Precautions Precautions: Fall Restrictions Weight Bearing Restrictions: Yes RLE Weight Bearing: Weight bearing as tolerated RLE Partial Weight Bearing Percentage or Pounds: Unsure; husband voiced today that she was not to put weight through RLE, however told therapist yesterday she was WBAT LLE Weight Bearing: Touchdown weight bearing       Mobility  Bed Mobility Sidelying to sit: Supervision;HOB elevated General bed mobility comments: pt is able to move both LE's in the bed without assist Transfers Equipment used:  (sliding board) Transfers:  (sliding board) Stand pivot transfers: Mod assist  Lateral/Scoot Transfers: Mod assist General transfer comment: confused of weight bearing status on RLE, only worked on [employer] transfers today to and from bed to chair    Exercises  Pt declined today, however states she will perform them this afternoon   PT Goals (current goals can now be found in the care plan section)    Visit  Information  Last PT Received On: 09/18/13    Subjective Data   Pt states she is adamant to go home; husband is unsure if there will be enough care.  Pt reports she is so weak today.   Cognition  Cognition Arousal/Alertness: Awake/alert Behavior During Therapy: WFL for tasks assessed/performed Overall Cognitive Status: Within Functional Limits for tasks assessed       End of Session PT - End of Session Equipment Utilized During Treatment: Gait belt Activity Tolerance: Patient limited by fatigue Patient left: with call bell/phone within reach;in bed        Teena Irani, PTA/CLT 09/18/2013, 12:02 PM

## 2013-09-19 MED ORDER — GUAIFENESIN-DM 100-10 MG/5ML PO SYRP
5.0000 mL | ORAL_SOLUTION | ORAL | Status: DC | PRN
  Administered 2013-09-19 (×2): 5 mL via ORAL
  Filled 2013-09-19 (×2): qty 5

## 2013-09-19 NOTE — Progress Notes (Signed)
PT Cancellation Note  Patient Details Name: Elizabeth Orr MRN: [id number] DOB: [birth year]   Cancelled Treatment:   Spouse requests therapy be held to day due to patient not feeling well.  States he is bringing in wheelchair this evening and would like therapy to instruct with sliding board transfer prior to discharge home tomorrow.  Therapist made aware.   Teena Irani, PTA/CLT 09/19/2013, 11:55 AM

## 2013-09-19 NOTE — Progress Notes (Signed)
Patients husband requested to have private conversation with myself about patients discharge plans.  States that after spending weekend here with patient and seeing her mobility status, he is afraid that he will not be able to care for the patient adequately at home.  Requests that SW look back into finding placement for rehab.  Explained to patient that I understand his concerns and that finding placement may still be the same situation that happened Friday. He states that he and all of their children are in agreement that she needs rehab.  I have left notes for CM and MD to address this with patient and family tomorrow morning.

## 2013-09-19 NOTE — Progress Notes (Signed)
Subjective: 4 Days Post-Op Procedure(s) (LRB): OPEN REDUCTION INTERNAL FIXATION (ORIF) LEFTANKLE FRACTURE (Left) Patient reports pain as moderate.   Cough  Objective: Vital signs in last 24 hours: Temp:  [98.9 F (37.2 C)-99.1 F (37.3 C)] 98.9 F (37.2 C) (02/01 0546) Pulse Rate:  [117-121] 119 (02/01 0546) Resp:  [18-20] 20 (02/01 0546) BP: (107-135)/(58-93) 114/93 mmHg (02/01 0546) SpO2:  [94 %-100 %] 100 % (02/01 0546) FiO2 (%):  [21 %] 21 % (01/31 1511)  Intake/Output from previous day: 01/31 0701 - 02/01 0700 In: 440 [P.O.:440] Out: 300 [Urine:300] Intake/Output this shift:     Recent Labs  09/17/13 0440 09/18/13 0704  HGB 9.3* 8.9*    Recent Labs  09/17/13 0440 09/18/13 0704  WBC 13.3* 14.6*  RBC 2.40* 2.28*  HCT 28.9* 26.9*  PLT 196 178   No results found for this basename: NA, K, CL, CO2, BUN, CREATININE, GLUCOSE, CALCIUM,  in the last 72 hours No results found for this basename: LABPT, INR,  in the last 72 hours  Neurologically intact  Assessment/Plan: 4 Days Post-Op Procedure(s) (LRB): OPEN REDUCTION INTERNAL FIXATION (ORIF) LEFTANKLE FRACTURE (Left) Plan for discharge tomorrow Discharge home with home health  Arther Abbott 09/19/2013, 8:49 AM

## 2013-09-20 MED ORDER — ENOXAPARIN SODIUM 40 MG/0.4ML ~~LOC~~ SOLN: 40.0000 mg | SUBCUTANEOUS | Status: DC

## 2013-09-20 MED ORDER — OXYCODONE-ACETAMINOPHEN 5-325 MG PO TABS: 1.0000 | ORAL_TABLET | ORAL | Status: DC | PRN

## 2013-09-20 MED ORDER — HEPARIN SOD (PORK) LOCK FLUSH 100 UNIT/ML IV SOLN
500.0000 [IU] | INTRAVENOUS | Status: AC | PRN
  Administered 2013-09-20: 500 [IU]
  Filled 2013-09-20: qty 5

## 2013-09-20 NOTE — Discharge Instructions (Signed)
Elevate left ankle.  Keep ankle dry.  Do not remove dressing.  Keep appointment with Dr. Luna Glasgow in two weeks.  Use walker.

## 2013-09-20 NOTE — Progress Notes (Signed)
Subjective: 5 Days Post-Op Procedure(s) (LRB): OPEN REDUCTION INTERNAL FIXATION (ORIF) LEFTANKLE FRACTURE (Left) Patient reports pain as mild.    Objective: Vital signs in last 24 hours: Temp:  [98.1 F (36.7 C)-98.6 F (37 C)] 98.3 F (36.8 C) (02/02 0550) Pulse Rate:  [92-118] 99 (02/02 0920) Resp:  [18-20] 18 (02/02 0920) BP: (128-135)/(52-80) 135/77 mmHg (02/02 0550) SpO2:  [97 %-99 %] 97 % (02/02 0920)  Intake/Output from previous day: 02/01 0701 - 02/02 0700 In: 720 [P.O.:720] Out: -  Intake/Output this shift:     Recent Labs  09/18/13 0704  HGB 8.9*    Recent Labs  09/18/13 0704  WBC 14.6*  RBC 2.28*  HCT 26.9*  PLT 178   No results found for this basename: NA, K, CL, CO2, BUN, CREATININE, GLUCOSE, CALCIUM,  in the last 72 hours No results found for this basename: LABPT, INR,  in the last 72 hours  Neurologically intact Neurovascular intact Sensation intact distally Intact pulses distally Dorsiflexion/Plantar flexion intact  She has wanted to go home.  Her husband refused to cooperate and get physical therapy equipment as he wants her to go do a SNF.  They have been arguing and arguing loudly in the room with each other.  She has refused to go to a SNF.  I have talked very seriously with the patient.  She has a bed offer in [place] for her to go but it expires this afternoon.  I told her should she lose this offer, I would not know what to do next.  After some give and take and discussion, she has finally agreed to go to the SNF in [place].  I know this is a distance for her from [place] but she needs a SNF facility to properly recover.  She has multiple medical problems that need to be addressed.  I will fill out the proper forms.  She will need to go by ambulance.  Assessment/Plan: 5 Days Post-Op Procedure(s) (LRB): OPEN REDUCTION INTERNAL FIXATION (ORIF) LEFTANKLE FRACTURE (Left) Discharge to SNF  Elizabeth Orr 09/20/2013, 1:39 PM

## 2013-09-20 NOTE — Progress Notes (Signed)
Nutrition Brief Note  RD pulled to chart due to LOS.  Wt Readings from Last 15 Encounters:  09/15/13 215 lb 9.6 oz (97.796 kg)  09/15/13 215 lb 9.6 oz (97.796 kg)  07/06/13 203 lb 6.4 oz (92.262 kg)  06/04/13 204 lb (92.534 kg)  02/24/13 206 lb (93.441 kg)  02/17/13 191 lb (86.637 kg)  02/09/13 191 lb (86.637 kg)  02/02/13 211 lb 6.4 oz (95.89 kg)  01/22/13 203 lb 0.7 oz (92.1 kg)  01/22/13 203 lb 0.7 oz (92.1 kg)  01/13/13 203 lb (92.08 kg)  01/06/13 208 lb (94.348 kg)  12/30/12 211 lb 6.4 oz (95.89 kg)  12/14/12 217 lb (98.431 kg)  12/08/12 219 lb (99.338 kg)    Body mass index is 40.76 kg/(m^2). Patient meets criteria for extreme obesity, class III based on current BMI.   Current diet order is Dysphagia 3, patient is consuming approximately 50-100% of meals at this time. Labs and medications reviewed.  No nutrition interventions warranted at this time. If nutrition issues arise, please consult RD.   Enid Maultsby A. Jimmye Norman, RD, LDN Pager: [phone]

## 2013-09-20 NOTE — Clinical Social Work Placement (Signed)
Clinical Social Work Department CLINICAL SOCIAL WORK PLACEMENT NOTE 09/20/2013  Patient:  Elizabeth Orr, Elizabeth Orr  Account Number:  [id number] Admit date:  09/15/2013  Clinical Social Worker:  Benay Pike, LCSW  Date/time:  09/17/2013 03:36 PM  Clinical Social Work is seeking post-discharge placement for this patient at the following level of care:   [place]   (*CSW will update this form in Epic as items are completed)   09/17/2013  Patient/family provided with [place] Department of Clinical Social Work's list of facilities offering this level of care within the geographic area requested by the patient (or if unable, by the patient's family).  09/17/2013  Patient/family informed of their freedom to choose among providers that offer the needed level of care, that participate in Medicare, Medicaid or managed care program needed by the patient, have an available bed and are willing to accept the patient.  09/17/2013  Patient/family informed of [facility]' ownership interest in [facility], as well as of the fact that they are under no obligation to receive care at this facility.  PASARR submitted to EDS on 09/17/2013 PASARR number received from EDS on 09/17/2013  FL2 transmitted to all facilities in geographic area requested by pt/family on  09/17/2013 FL2 transmitted to all facilities within larger geographic area on 09/17/2013  Patient informed that his/her managed care company has contracts with or will negotiate with  certain facilities, including the following:     Patient/family informed of bed offers received:  09/20/2013 Patient chooses bed at OTHER Physician recommends and patient chooses bed at  OTHER  Patient to be transferred to [facility] on  09/20/2013 Patient to be transferred to facility by [facility]  The following physician request were entered in Epic:   Additional Comments: [facility] [place], [place]

## 2013-09-20 NOTE — Progress Notes (Addendum)
Pt is to be discharged to [facility] today. Pt is in NAD, IV is out, all paperwork has been reviewed/discussed with patient, and there are no questions/concerns at this time. Report has been called. Assessment is unchanged from this morning. Pt is to be accompanied downstairs by staff and family via EMS.

## 2013-09-20 NOTE — Clinical Social Work Note (Signed)
Pt's husband began to question his ability to care for pt at home over the weekend. CSW received bed offer at [facility]. Pt and husband notified. Pt originally not willing to go that far, but is now agreeable. CSW will fax d/c summary when complete and pt to transfer via [facility]. Supervisor notified for LOG.  Benay Pike, [place]

## 2013-09-20 NOTE — Discharge Summary (Signed)
Physician Discharge Summary  Patient ID: Elizabeth Orr MRN: [id number] DOB/AGE: 1961-03-13 53 y.o.  Admit date: 09/15/2013 Discharge date: 09/20/2013  Admission Diagnoses: Fracture of left ankle, bimalleolar  Discharge Diagnoses: Fracture of left ankle, bimalleolar with disruption of the medial deltoid collateral ligament; post fracture of the right femur last summer with residual weakness on the right; Active Problems:   Bimalleolar ankle fracture   Discharged Condition: good  Hospital Course: She was seen in the ER after x-rays showed the bimalleolar fracture of the left ankle.  She was evaluated.  She had no other injuries.  She is recovering from a fracture of the right femur and is still in treatment for this with use of a walker.  She lost balance using a walker as she had been on the walker only two weeks and was still unsteady.  She was taken to the operating room and had repair of the left ankle.  She has been seen and evaluated by physical therapy.  She is weak.  She needs SNF care.  She wanted to go home at first and be treated by home health.  She has no one at home to take care of her.  Her husband works out of town.  There was considerable debate back and forth with the family, the patient and her husband about after hospital treatment.  Finally, she has agreed to go to SNF even though the only and closed bed offer is in [place], [place].    She has remained afebrile.  She has pain controlled.   She will continue her enoxaparin for one month.  She will have pain medicine for pain.  She is to have toe touch only on the left with walker.  I will see her in my office in two weeks for dressing change and removal of staples, application of a cast.  Consults: None  Significant Diagnostic Studies: labs: Labs were normal.  She has had x-rays showing the fracture of the left ankle and post reduction.  Treatments: surgery: open reduction and internal fixation of the lateral malleolus and  repair of the deltoid medial collateral ligament of the left ankle  Discharge Exam: Blood pressure 135/77, pulse 99, temperature 98.3 F (36.8 C), temperature source Oral, resp. rate 18, height 5\' 1"  (1.549 m), weight 97.796 kg (215 lb 9.6 oz), SpO2 97.00%. General appearance: alert, cooperative and appears stated age.  NV is intact.  She is afebrile and vital signs are normal.  Disposition: 01-Home or Self Care  Discharge Orders   Future Orders Complete By Expires   Call MD / Call 911  As directed    Comments:     If you experience chest pain or shortness of breath, CALL 911 and be transported to the hospital emergency room.  If you develope a fever above 101 F, pus (white drainage) or increased drainage or redness at the wound, or calf pain, call your surgeon's office.   Constipation Prevention  As directed    Comments:     Drink plenty of fluids.  Prune juice may be helpful.  You may use a stool softener, such as Colace (over the counter) 100 mg twice a day.  Use MiraLax (over the counter) for constipation as needed.   Diet - low sodium heart healthy  As directed    Discharge instructions  As directed    Comments:     Keep ankle dry.  Elevate ankle.  See Dr. Luna Glasgow in two weeks for appointment and removal  of sutures and application of new cast.  His office is [phone].  Use walker.  Toe touch only on the left.   Driving restrictions  As directed    Comments:     No driving for 12 weeks   Increase activity slowly as tolerated  As directed    Lifting restrictions  As directed    Comments:     No lifting for 8 weeks       Medication List    TAKE these medications       albuterol 108 (90 BASE) MCG/ACT inhaler  Commonly known as:  PROVENTIL HFA;VENTOLIN HFA  Inhale 2 puffs into the lungs every 6 (six) hours as needed for wheezing.     enoxaparin 40 MG/0.4ML injection  Commonly known as:  LOVENOX  Inject 0.4 mLs (40 mg total) into the skin daily.     iron  polysaccharides 150 MG capsule  Commonly known as:  NIFEREX  Take 1 capsule (150 mg total) by mouth daily.     magnesium hydroxide 400 MG/5ML suspension  Commonly known as:  MILK OF MAGNESIA  Take 30 mLs by mouth daily as needed for constipation.     omeprazole 20 MG capsule  Commonly known as:  PRILOSEC  Take 1 capsule (20 mg total) by mouth daily.     oxyCODONE-acetaminophen 5-325 MG per tablet  Commonly known as:  PERCOCET/ROXICET  Take 1 tablet by mouth every 4 (four) hours as needed for moderate pain or severe pain.     potassium chloride SA 20 MEQ tablet  Commonly known as:  K-DUR,KLOR-CON  Take 1 tablet (20 mEq total) by mouth daily.     sucralfate 1 GM/10ML suspension  Commonly known as:  CARAFATE  Take 1 g by mouth 4 (four) times daily.     tamoxifen 20 MG tablet  Commonly known as:  NOLVADEX  Take 1 tablet (20 mg total) by mouth daily.      ASK your doctor about these medications       gabapentin 300 MG capsule  Commonly known as:  NEURONTIN  Take 900 mg in AM and 900 mg in PM           Follow-up Information   Follow up with Sanjuana Kava, MD In 2 weeks.   Specialty:  Orthopedic Surgery   Contact information:   [place] [address] [phone]       Signed: Sanjuana Kava 09/20/2013, 1:50 PM

## 2013-09-21 ENCOUNTER — Encounter (HOSPITAL_COMMUNITY): Payer: Self-pay | Admitting: Orthopaedic Surgery

## 2013-09-25 ENCOUNTER — Emergency Department (HOSPITAL_COMMUNITY): Payer: Medicaid Other

## 2013-09-25 ENCOUNTER — Encounter (HOSPITAL_COMMUNITY): Payer: Self-pay | Admitting: Emergency Medicine

## 2013-09-25 ENCOUNTER — Inpatient Hospital Stay (HOSPITAL_COMMUNITY)
Admission: EM | Admit: 2013-09-25 | Discharge: 2013-09-28 | DRG: 189 | Disposition: A | Discharge status: Home Health | Payer: Medicaid Other | Attending: Internal Medicine | Admitting: Internal Medicine

## 2013-09-25 DIAGNOSIS — Z79899 Other long term (current) drug therapy: Secondary | ICD-10-CM

## 2013-09-25 DIAGNOSIS — D696 Thrombocytopenia, unspecified: Secondary | ICD-10-CM | POA: Diagnosis present

## 2013-09-25 DIAGNOSIS — F3289 Other specified depressive episodes: Secondary | ICD-10-CM | POA: Diagnosis present

## 2013-09-25 DIAGNOSIS — K921 Melena: Secondary | ICD-10-CM

## 2013-09-25 DIAGNOSIS — R7401 Elevation of levels of liver transaminase levels: Secondary | ICD-10-CM | POA: Diagnosis present

## 2013-09-25 DIAGNOSIS — J811 Chronic pulmonary edema: Secondary | ICD-10-CM

## 2013-09-25 DIAGNOSIS — Z87442 Personal history of urinary calculi: Secondary | ICD-10-CM

## 2013-09-25 DIAGNOSIS — K709 Alcoholic liver disease, unspecified: Secondary | ICD-10-CM | POA: Diagnosis present

## 2013-09-25 DIAGNOSIS — Z9221 Personal history of antineoplastic chemotherapy: Secondary | ICD-10-CM

## 2013-09-25 DIAGNOSIS — E8809 Other disorders of plasma-protein metabolism, not elsewhere classified: Secondary | ICD-10-CM | POA: Diagnosis present

## 2013-09-25 DIAGNOSIS — K7030 Alcoholic cirrhosis of liver without ascites: Secondary | ICD-10-CM | POA: Diagnosis present

## 2013-09-25 DIAGNOSIS — Z853 Personal history of malignant neoplasm of breast: Secondary | ICD-10-CM

## 2013-09-25 DIAGNOSIS — E8779 Other fluid overload: Secondary | ICD-10-CM | POA: Diagnosis present

## 2013-09-25 DIAGNOSIS — C50911 Malignant neoplasm of unspecified site of right female breast: Secondary | ICD-10-CM

## 2013-09-25 DIAGNOSIS — R601 Generalized edema: Secondary | ICD-10-CM | POA: Diagnosis present

## 2013-09-25 DIAGNOSIS — G8929 Other chronic pain: Secondary | ICD-10-CM | POA: Diagnosis present

## 2013-09-25 DIAGNOSIS — R062 Wheezing: Secondary | ICD-10-CM

## 2013-09-25 DIAGNOSIS — S82843A Displaced bimalleolar fracture of unspecified lower leg, initial encounter for closed fracture: Secondary | ICD-10-CM

## 2013-09-25 DIAGNOSIS — R079 Chest pain, unspecified: Secondary | ICD-10-CM

## 2013-09-25 DIAGNOSIS — F411 Generalized anxiety disorder: Secondary | ICD-10-CM | POA: Diagnosis present

## 2013-09-25 DIAGNOSIS — E871 Hypo-osmolality and hyponatremia: Secondary | ICD-10-CM | POA: Diagnosis present

## 2013-09-25 DIAGNOSIS — R7402 Elevation of levels of lactic acid dehydrogenase (LDH): Secondary | ICD-10-CM | POA: Diagnosis present

## 2013-09-25 DIAGNOSIS — E43 Unspecified severe protein-calorie malnutrition: Secondary | ICD-10-CM | POA: Diagnosis present

## 2013-09-25 DIAGNOSIS — F1910 Other psychoactive substance abuse, uncomplicated: Secondary | ICD-10-CM

## 2013-09-25 DIAGNOSIS — C50919 Malignant neoplasm of unspecified site of unspecified female breast: Secondary | ICD-10-CM

## 2013-09-25 DIAGNOSIS — K219 Gastro-esophageal reflux disease without esophagitis: Secondary | ICD-10-CM

## 2013-09-25 DIAGNOSIS — IMO0001 Reserved for inherently not codable concepts without codable children: Secondary | ICD-10-CM

## 2013-09-25 DIAGNOSIS — T502X5A Adverse effect of carbonic-anhydrase inhibitors, benzothiadiazides and other diuretics, initial encounter: Secondary | ICD-10-CM | POA: Diagnosis present

## 2013-09-25 DIAGNOSIS — Y838 Other surgical procedures as the cause of abnormal reaction of the patient, or of later complication, without mention of misadventure at the time of the procedure: Secondary | ICD-10-CM | POA: Diagnosis present

## 2013-09-25 DIAGNOSIS — J810 Acute pulmonary edema: Principal | ICD-10-CM | POA: Diagnosis present

## 2013-09-25 DIAGNOSIS — Z833 Family history of diabetes mellitus: Secondary | ICD-10-CM

## 2013-09-25 DIAGNOSIS — D509 Iron deficiency anemia, unspecified: Secondary | ICD-10-CM | POA: Diagnosis present

## 2013-09-25 DIAGNOSIS — F17200 Nicotine dependence, unspecified, uncomplicated: Secondary | ICD-10-CM | POA: Diagnosis present

## 2013-09-25 DIAGNOSIS — E669 Obesity, unspecified: Secondary | ICD-10-CM | POA: Diagnosis present

## 2013-09-25 DIAGNOSIS — N390 Urinary tract infection, site not specified: Secondary | ICD-10-CM

## 2013-09-25 DIAGNOSIS — F418 Other specified anxiety disorders: Secondary | ICD-10-CM

## 2013-09-25 DIAGNOSIS — E876 Hypokalemia: Secondary | ICD-10-CM | POA: Diagnosis present

## 2013-09-25 DIAGNOSIS — Z8249 Family history of ischemic heart disease and other diseases of the circulatory system: Secondary | ICD-10-CM

## 2013-09-25 DIAGNOSIS — R1310 Dysphagia, unspecified: Secondary | ICD-10-CM

## 2013-09-25 DIAGNOSIS — K760 Fatty (change of) liver, not elsewhere classified: Secondary | ICD-10-CM | POA: Diagnosis present

## 2013-09-25 DIAGNOSIS — D649 Anemia, unspecified: Secondary | ICD-10-CM

## 2013-09-25 DIAGNOSIS — D7589 Other specified diseases of blood and blood-forming organs: Secondary | ICD-10-CM | POA: Diagnosis present

## 2013-09-25 DIAGNOSIS — F329 Major depressive disorder, single episode, unspecified: Secondary | ICD-10-CM | POA: Diagnosis present

## 2013-09-25 DIAGNOSIS — R740 Nonspecific elevation of levels of transaminase and lactic acid dehydrogenase [LDH]: Secondary | ICD-10-CM

## 2013-09-25 DIAGNOSIS — F1020 Alcohol dependence, uncomplicated: Secondary | ICD-10-CM | POA: Diagnosis present

## 2013-09-25 HISTORY — DX: Alcohol dependence, uncomplicated: F10.20

## 2013-09-25 HISTORY — DX: Fatty (change of) liver, not elsewhere classified: K76.0

## 2013-09-25 HISTORY — DX: Iron deficiency anemia, unspecified: D50.9

## 2013-09-25 HISTORY — DX: Alcoholic cirrhosis of liver without ascites: K70.30

## 2013-09-25 HISTORY — DX: Other specified diseases of blood and blood-forming organs: D75.89

## 2013-09-25 LAB — CBC WITH DIFFERENTIAL/PLATELET
BLASTS: 0 %
Band Neutrophils: 0 % (ref 0–10)
Basophils Absolute: 0.2 10*3/uL — ABNORMAL HIGH (ref 0.0–0.1)
Basophils Relative: 2 % — ABNORMAL HIGH (ref 0–1)
Eosinophils Absolute: 0.2 10*3/uL (ref 0.0–0.7)
Eosinophils Relative: 2 % (ref 0–5)
HCT: 30.4 % — ABNORMAL LOW (ref 36.0–46.0)
Hemoglobin: 9.5 g/dL — ABNORMAL LOW (ref 12.0–15.0)
LYMPHS ABS: 0.8 10*3/uL (ref 0.7–4.0)
LYMPHS PCT: 7 % — AB (ref 12–46)
MCH: 37.4 pg — ABNORMAL HIGH (ref 26.0–34.0)
MCHC: 31.3 g/dL (ref 30.0–36.0)
MCV: 119.7 fL — ABNORMAL HIGH (ref 78.0–100.0)
METAMYELOCYTES PCT: 0 %
MONO ABS: 0.3 10*3/uL (ref 0.1–1.0)
MONOS PCT: 3 % (ref 3–12)
Myelocytes: 0 %
NEUTROS ABS: 9.3 10*3/uL — AB (ref 1.7–7.7)
NEUTROS PCT: 86 % — AB (ref 43–77)
NRBC: 0 /100{WBCs}
PLATELETS: 132 10*3/uL — AB (ref 150–400)
Promyelocytes Absolute: 0 %
RBC: 2.54 MIL/uL — ABNORMAL LOW (ref 3.87–5.11)
RDW: 18.5 % — AB (ref 11.5–15.5)
WBC: 10.8 10*3/uL — ABNORMAL HIGH (ref 4.0–10.5)

## 2013-09-25 LAB — COMPREHENSIVE METABOLIC PANEL
ALT: 14 U/L (ref 0–35)
AST: 49 U/L — ABNORMAL HIGH (ref 0–37)
Albumin: 1.3 g/dL — ABNORMAL LOW (ref 3.5–5.2)
Alkaline Phosphatase: 180 U/L — ABNORMAL HIGH (ref 39–117)
BUN: 4 mg/dL — ABNORMAL LOW (ref 6–23)
CALCIUM: 7.8 mg/dL — AB (ref 8.4–10.5)
CO2: 20 mEq/L (ref 19–32)
Chloride: 96 mEq/L (ref 96–112)
Creatinine, Ser: 0.4 mg/dL — ABNORMAL LOW (ref 0.50–1.10)
Glucose, Bld: 97 mg/dL (ref 70–99)
Potassium: 3.8 mEq/L (ref 3.7–5.3)
Sodium: 131 mEq/L — ABNORMAL LOW (ref 137–147)
TOTAL PROTEIN: 6.6 g/dL (ref 6.0–8.3)
Total Bilirubin: 2 mg/dL — ABNORMAL HIGH (ref 0.3–1.2)

## 2013-09-25 LAB — URINE MICROSCOPIC-ADD ON

## 2013-09-25 LAB — URINALYSIS, ROUTINE W REFLEX MICROSCOPIC
GLUCOSE, UA: NEGATIVE mg/dL
HGB URINE DIPSTICK: NEGATIVE
Ketones, ur: NEGATIVE mg/dL
Nitrite: POSITIVE — AB
PROTEIN: NEGATIVE mg/dL
Specific Gravity, Urine: 1.025 (ref 1.005–1.030)
Urobilinogen, UA: 2 mg/dL — ABNORMAL HIGH (ref 0.0–1.0)
pH: 6 (ref 5.0–8.0)

## 2013-09-25 LAB — PROTIME-INR
INR: 1.43 (ref 0.00–1.49)
PROTHROMBIN TIME: 17.1 s — AB (ref 11.6–15.2)

## 2013-09-25 LAB — PRO B NATRIURETIC PEPTIDE: Pro B Natriuretic peptide (BNP): 1957 pg/mL — ABNORMAL HIGH (ref 0–125)

## 2013-09-25 LAB — TROPONIN I: Troponin I: <0.3 ng/mL (ref <0.30)

## 2013-09-25 MED ORDER — FUROSEMIDE 10 MG/ML IJ SOLN
40.0000 mg | Freq: Once | INTRAMUSCULAR | Status: AC
  Administered 2013-09-25: 40 mg via INTRAVENOUS
  Filled 2013-09-25: qty 4

## 2013-09-25 MED ORDER — ACETAMINOPHEN 325 MG PO TABS: 650.0000 mg | ORAL_TABLET | ORAL | Status: DC | PRN

## 2013-09-25 MED ORDER — ONDANSETRON HCL 4 MG/2ML IJ SOLN
4.0000 mg | Freq: Once | INTRAMUSCULAR | Status: AC
  Administered 2013-09-25: 4 mg via INTRAVENOUS
  Filled 2013-09-25: qty 2

## 2013-09-25 MED ORDER — ONDANSETRON HCL 4 MG/2ML IJ SOLN: 4.0000 mg | Freq: Four times a day (QID) | INTRAMUSCULAR | Status: DC | PRN

## 2013-09-25 MED ORDER — SODIUM CHLORIDE 0.9 % IV SOLN: INTRAVENOUS | Status: DC

## 2013-09-25 MED ORDER — OXYCODONE-ACETAMINOPHEN 5-325 MG PO TABS
1.0000 | ORAL_TABLET | ORAL | Status: DC | PRN
  Administered 2013-09-26 (×2): 1 via ORAL
  Filled 2013-09-25 (×2): qty 1

## 2013-09-25 MED ORDER — FUROSEMIDE 20 MG PO TABS: 20.0000 mg | ORAL_TABLET | Freq: Every day | ORAL | Status: DC

## 2013-09-25 MED ORDER — IOHEXOL 350 MG/ML SOLN
100.0000 mL | Freq: Once | INTRAVENOUS | Status: AC | PRN
  Administered 2013-09-25: 100 mL via INTRAVENOUS

## 2013-09-25 MED ORDER — SUCRALFATE 1 GM/10ML PO SUSP
1.0000 g | Freq: Four times a day (QID) | ORAL | Status: DC
  Administered 2013-09-26 – 2013-09-28 (×12): 1 g via ORAL
  Filled 2013-09-25 (×19): qty 10

## 2013-09-25 MED ORDER — ENOXAPARIN SODIUM 40 MG/0.4ML ~~LOC~~ SOLN
40.0000 mg | SUBCUTANEOUS | Status: DC
  Filled 2013-09-25: qty 0.4

## 2013-09-25 MED ORDER — ASPIRIN EC 325 MG PO TBEC
325.0000 mg | DELAYED_RELEASE_TABLET | Freq: Every day | ORAL | Status: DC
  Administered 2013-09-26 – 2013-09-27 (×2): 325 mg via ORAL
  Filled 2013-09-25 (×2): qty 1

## 2013-09-25 MED ORDER — POTASSIUM CHLORIDE CRYS ER 20 MEQ PO TBCR: 20.0000 meq | EXTENDED_RELEASE_TABLET | Freq: Every day | ORAL | Status: DC

## 2013-09-25 MED ORDER — TAMOXIFEN CITRATE 10 MG PO TABS
20.0000 mg | ORAL_TABLET | Freq: Every day | ORAL | Status: DC
  Administered 2013-09-26 – 2013-09-28 (×3): 20 mg via ORAL
  Filled 2013-09-25 (×5): qty 2

## 2013-09-25 MED ORDER — CEFTRIAXONE SODIUM 1 G IJ SOLR
1.0000 g | INTRAMUSCULAR | Status: DC
  Administered 2013-09-25 – 2013-09-27 (×3): 1 g via INTRAVENOUS
  Filled 2013-09-25 (×3): qty 10

## 2013-09-25 MED ORDER — DEXTROSE 5 % IV SOLN
1.0000 g | Freq: Once | INTRAVENOUS | Status: AC
  Administered 2013-09-25: 1 g via INTRAVENOUS
  Filled 2013-09-25: qty 10

## 2013-09-25 MED ORDER — POLYSACCHARIDE IRON COMPLEX 150 MG PO CAPS
150.0000 mg | ORAL_CAPSULE | Freq: Every day | ORAL | Status: DC
  Administered 2013-09-26 – 2013-09-28 (×3): 150 mg via ORAL
  Filled 2013-09-25 (×5): qty 1

## 2013-09-25 MED ORDER — GABAPENTIN 300 MG PO CAPS
900.0000 mg | ORAL_CAPSULE | Freq: Two times a day (BID) | ORAL | Status: DC
  Administered 2013-09-26 – 2013-09-28 (×6): 900 mg via ORAL
  Filled 2013-09-25 (×10): qty 3

## 2013-09-25 MED ORDER — SODIUM CHLORIDE 0.9 % IV SOLN
INTRAVENOUS | Status: DC
Start: 2013-09-25 — End: 2013-09-25

## 2013-09-25 MED ORDER — POLYETHYLENE GLYCOL 3350 17 G PO PACK
17.0000 g | PACK | Freq: Every day | ORAL | Status: DC
  Administered 2013-09-26 – 2013-09-28 (×3): 17 g via ORAL
  Filled 2013-09-25 (×3): qty 1

## 2013-09-25 MED ORDER — DOCUSATE SODIUM 100 MG PO CAPS
100.0000 mg | ORAL_CAPSULE | Freq: Two times a day (BID) | ORAL | Status: DC
  Administered 2013-09-26 – 2013-09-28 (×6): 100 mg via ORAL
  Filled 2013-09-25 (×10): qty 1

## 2013-09-25 MED ORDER — ALBUTEROL SULFATE HFA 108 (90 BASE) MCG/ACT IN AERS
2.0000 | INHALATION_SPRAY | Freq: Four times a day (QID) | RESPIRATORY_TRACT | Status: DC | PRN
  Filled 2013-09-25: qty 6.7

## 2013-09-25 MED ORDER — PANTOPRAZOLE SODIUM 40 MG PO TBEC
40.0000 mg | DELAYED_RELEASE_TABLET | Freq: Every day | ORAL | Status: DC
  Administered 2013-09-26 – 2013-09-28 (×3): 40 mg via ORAL
  Filled 2013-09-25 (×4): qty 1

## 2013-09-25 NOTE — H&P (Signed)
PCP:   Dionisio Paschal, NP   Chief Complaint:  Left her rehab AMA,   HPI: 53 yo female h/o obesity, polysubstance abuse, breast cancer, gerd with recent surgery for repair of an left ankle fracture was d/c by ortho this past Monday to a rehab center.  Husband went to rehab to see her today, and found out she had fallen during physical therapy and suffered a laceration to her right knee which needed sutures.  He became angry because he was not notified of this, so signed the patient out AMA and brought her to [facility] ED.  This history obtained from ed staff.  Husband left to tend to grandchildren.  Upon further questioning pt revealed she had been having some sscp that is brief for several days.  She says she has had some mild swelling to her legs since the surgery.  She has been more sob when lying down and coughs when lying down.  No fevers.  She has been doing ok with rehab.  No dysuria.  No h/o chf.  Review of Systems:  Positive and negative as per HPI otherwise all other systems are negative  Past Medical History: Past Medical History  Diagnosis Date  . Kidney stones 1989  . GERD (gastroesophageal reflux disease)   . History of stomach ulcers 2003/2007    Hx of esophageal ulcers and stomach ulcers due to reflux  . Back pain   . Chronic leg pain   . Polysubstance abuse   . Depression   . Anxiety   . Exertional shortness of breath   . History of blood transfusion     "recently had my 2nd; related to chemo" (01/21/2013)  . [id number])     "monthly" (01/21/2013)  . Breast cancer 06/08/12    right mastectomy  . Infiltrating ductal carcinoma of breast 07/09/2012    Neijstrom:  chemo   Past Surgical History  Procedure Laterality Date  . Esophagogastroduodenoscopy    . Esophageal dilation    . Portacath placement  07/22/2012    Procedure: INSERTION PORT-A-CATH;  Surgeon: Donato Heinz, MD;  Location: [facility];  Service: General;  Laterality: Left;  left subclavian  .  Esophagogastroduodenoscopy (egd) with esophageal dilation  10/04/2005    Rourk-Extensive geographic ulcerations distal third of the tubular esoophagus consistent with severe ulcerative relux esophagitis, early stricture formation status post dilation as described above. 2.  Multiple gastric ulcers without bleeding stigmata as described above. otherwise normal stomach.  3. large bulbar ulcer without bleeding stigmata. otherwise D1 and D2 appeared normal.  . Colonoscopy, esophagogastroduodenoscopy (egd) and esophageal dilation  08/21/2012    Procedure: COLONOSCOPY, ESOPHAGOGASTRODUODENOSCOPY (EGD) AND ESOPHAGEAL DILATION;  Surgeon: Daneil Dolin, MD;  Location: [facility];  Service: Endoscopy;  Laterality: N/A;  9:30  . Breast biopsy Right 04/2012  . Mastectomy Right 06/08/12  . Tubal ligation  1989  . Femur im nail Right 01/21/2013    Procedure: INTRAMEDULLARY (IM) RETROGRADE FEMORAL NAILING;  Surgeon: Hessie Dibble, MD;  Location: [facility];  Service: Orthopedics;  Laterality: Right;  . Orif ankle fracture Left 09/15/2013    Procedure: OPEN REDUCTION INTERNAL FIXATION (ORIF) LEFTANKLE FRACTURE;  Surgeon: Sanjuana Kava, MD;  Location: [facility];  Service: Orthopedics;  Laterality: Left;    Medications: Prior to Admission medications   Medication Sig Start Date End Date Taking? Authorizing Provider  docusate sodium (COLACE) 100 MG capsule Take 100 mg by mouth 2 (two) times daily.   Yes Historical Provider, MD  enoxaparin (LOVENOX) 40 MG/0.4ML injection Inject 0.4 mLs (40 mg total) into the skin daily. 09/20/13  Yes Sanjuana Kava, MD  gabapentin (NEURONTIN) 300 MG capsule Take 900 mg by mouth 2 (two) times daily.   Yes Historical Provider, MD  iron polysaccharides (NIFEREX) 150 MG capsule Take 1 capsule (150 mg total) by mouth daily. 06/30/13  Yes Manon Hilding Kefalas, PA-C  omeprazole (PRILOSEC) 20 MG capsule Take 1 capsule (20 mg total) by mouth daily. 05/28/13  Yes Manon Hilding Kefalas, PA-C  polyethylene  glycol (MIRALAX / GLYCOLAX) packet Take 17 g by mouth daily.   Yes Historical Provider, MD  potassium chloride SA (K-DUR,KLOR-CON) 20 MEQ tablet Take 1 tablet (20 mEq total) by mouth daily. 06/30/13  Yes Manon Hilding Kefalas, PA-C  sucralfate (CARAFATE) 1 GM/10ML suspension Take 1 g by mouth 4 (four) times daily.   Yes Historical Provider, MD  tamoxifen (NOLVADEX) 20 MG tablet Take 1 tablet (20 mg total) by mouth daily. 04/16/13  Yes Verlan Friends, MD  albuterol (PROVENTIL HFA;VENTOLIN HFA) 108 (90 BASE) MCG/ACT inhaler Inhale 2 puffs into the lungs every 6 (six) hours as needed for wheezing. 12/30/12   Baird Cancer, PA-C  magnesium hydroxide (MILK OF MAGNESIA) 400 MG/5ML suspension Take 30 mLs by mouth daily as needed for constipation.    Historical Provider, MD  oxyCODONE-acetaminophen (PERCOCET/ROXICET) 5-325 MG per tablet Take 1 tablet by mouth every 4 (four) hours as needed for moderate pain or severe pain. 09/20/13   Sanjuana Kava, MD    Allergies:   Allergies  Allergen Reactions  . Citalopram     Makes her feel drowsy/sleepy.  . Effexor [Venlafaxine Hcl]     Makes pt feel very bad,will not take again.  . Motrin [Ibuprofen] Swelling    Lips swell  . Trazodone And Nefazodone     Makes pt feel very bad and "hung over"    Social History:  reports that she has been smoking.  She has never used smokeless tobacco. She reports that she drinks alcohol. She reports that she does not use illicit drugs.  Family History: Family History  Problem Relation Age of Onset  . Heart attack Father   . COPD Father   . Alcohol abuse Brother   . Alcohol abuse Brother   . Diabetes Mother     Physical Exam: Filed Vitals:   09/25/13 1549 09/25/13 2052  BP: 144/80 120/75  Pulse: 108 114  Temp: 98.2 F (36.8 C) 98.6 F (37 C)  TempSrc: Oral Oral  Resp: 24 24  SpO2: 100% 97%   General appearance: alert, cooperative and no distress Head: Normocephalic, without obvious abnormality,  atraumatic Eyes: negative Nose: Nares normal. Septum midline. Mucosa normal. No drainage or sinus tenderness. Neck: no JVD and supple, symmetrical, trachea midline Lungs: clear to auscultation bilaterally Heart: regular rate and rhythm, S1, S2 normal, no murmur, click, rub or gallop Abdomen: soft, non-tender; bowel sounds normal; no masses,  no organomegaly Extremities: edema 1+ edema to rle, lle splinted Pulses: 2+ and symmetric Skin: Skin color, texture, turgor normal. No rashes or lesions Neurologic: Grossly normal    Labs on Admission:   Recent Labs  09/25/13 1656  NA 131*  K 3.8  CL 96  CO2 20  GLUCOSE 97  BUN 4*  CREATININE 0.40*  CALCIUM 7.8*    Recent Labs  09/25/13 1656  AST 49*  ALT 14  ALKPHOS 180*  BILITOT 2.0*  PROT 6.6  ALBUMIN 1.3*  Recent Labs  09/25/13 1656  WBC 10.8*  NEUTROABS 9.3*  HGB 9.5*  HCT 30.4*  MCV 119.7*  PLT 132*    Recent Labs  09/25/13 1656  TROPONINI <0.30   Radiological Exams on Admission: Dg Knee 2 Views Right  09/25/2013   CLINICAL DATA:  Knee pain. Wound check, swelling and discharge from the incision site.  EXAM: RIGHT KNEE - 1-2 VIEW  COMPARISON:  01/26/2013  FINDINGS: Postoperative changes noted within the visualized mid to distal right femur. Fracture line remains partially evident medially. Heterotopic bone or exuberant callus formation noted along the medial femoral metaphysis. Small joint effusion. No bony changes noted to suggest osteomyelitis.  IMPRESSION: Hardware in the distal femur. Fracture line remains at least partially evident medially with overlying heterotopic bone or exuberant callus formation.   Electronically Signed   By: Rolm Baptise M.D.   On: 09/25/2013 16:47   Dg Ankle 2 Views Left  09/25/2013   CLINICAL DATA:  Pain ; recent trauma  EXAM: LEFT ANKLE - 2 VIEW  COMPARISON:  September 16, 2003  FINDINGS: Frontal and lateral views were obtained. There is an overlying cast. There are skin staples  medially and laterally. There is screw and plate fixation through a fracture of the distal fibula with alignment essentially anatomic. No new fracture or dislocation apparent. No erosive change or bony destruction. Joint spaces appear intact.  IMPRESSION: Postoperative change. No new fracture. Ankle mortise appears intact. No bony destruction.   Electronically Signed   By: Lowella Grip M.D.   On: 09/25/2013 16:40   Ct Angio Chest Pe W/cm &/or Wo Cm  09/25/2013   CLINICAL DATA:  53 year old female with shortness of breath, chest pain and weakness.  EXAM: CT ANGIOGRAPHY CHEST WITH CONTRAST  TECHNIQUE: Multidetector CT imaging of the chest was performed using the standard protocol during bolus administration of intravenous contrast. Multiplanar CT image reconstructions including MIPs were obtained to evaluate the vascular anatomy.  CONTRAST:  136mL OMNIPAQUE IOHEXOL 350 MG/ML SOLN  COMPARISON:  09/25/2012 and prior chest radiographs  FINDINGS: This is a technically satisfactory study.  No pulmonary emboli are identified.  There is no evidence of thoracic aortic aneurysm/dissection.  Cardiomegaly is present.  Small bilateral pleural effusions are identified. There is no evidence of pericardial effusion.  Ground-glass opacities within both lungs identified - favor pulmonary edema over pneumonia.  Confluent opacities along the the anterior right upper and middle lobes are probably related to posttreatment/radiation changes.  Mild bibasilar atelectasis is present.  Diffuse subcutaneous edema is noted.  Patient is status post right mastectomy.  No enlarged lymph nodes are identified.  Diffuse fatty infiltration of the liver is present.  A small to moderate hiatal hernia is identified.  No acute bony abnormalities are noted.  Review of the MIP images confirms the above findings.  IMPRESSION: No evidence of pulmonary emboli or thoracic aortic dissection.  Bilateral ground-glass opacities -favor edema over infection.   Cardiomegaly with small bilateral pleural effusions, diffuse subcutaneous edema and mild bibasilar atelectasis.  Right mastectomy and likely posttreatment/radiation changes within the anterior right lung.  Diffuse fatty infiltration of the liver and small to moderate hiatal hernia.   Electronically Signed   By: Hassan Rowan M.D.   On: 09/25/2013 21:38   Dg Chest Portable 1 View  09/25/2013   CLINICAL DATA:  Shortness of breath and fever  EXAM: PORTABLE CHEST - 1 VIEW  COMPARISON:  January 21, 2013  FINDINGS: Heart is prominent with interstitial  edema. There is no airspace consolidation. The pulmonary vascularity is normal.  Port-A-Cath tip is in the superior cava. No pneumothorax. There are surgical clips in the right axillary region.  IMPRESSION: Heart is prominent with interstitial edema. Suspect a degree of congestive heart failure. There is no apparent airspace consolidation.   Electronically Signed   By: Lowella Grip M.D.   On: 09/25/2013 15:58    Assessment/Plan  53 yo female with atypical chest pain, uti and pulm edema  Principal Problem:   Chest pain  Serial enzymes.  ekg no acute issues.  Asa.  Echo in am or as outpt.  Active Problems:   Bimalleolar ankle fracture   UTI (lower urinary tract infection) rocephin   Polysubstance abuse   Pulmonary edema, postoperative  Post op edema vs chf.  Place on low dose lasix.  Needs eventual echo.  i have told the patient that she will be obs overnight but will need to go back to rehab center.  She understands, but her husband is not present right now to have this discussion.      Donatella Walski A 09/25/2013, 10:55 PM

## 2013-09-25 NOTE — ED Notes (Addendum)
Pt reports had surgery a couple of weeks ago on r knee.  C/O pain to r knee and has posterior splint on left lower leg.  Reports fractured ankle.   PT also reports has been having chest pain that comes and goes for the past month.  Pt very lethargic.  Reports is because she didn't sleep any last night.  C/O SOB, no n/v.

## 2013-09-25 NOTE — ED Notes (Signed)
Husband in room with pt and says Dr. Luna Glasgow did surgery  for fractured ankle Jan 28th.  Reports pt was sent to a rehab facility in [place].   Reports several months ago had surgery on r knee.  Pt has laceration that is closed with sutures on r knee.  Husband says doesn't know how pt got the laceration on her knee.   Husband was upset because he said pt was not supposed to put any weight on r knee.  Husband took pt out of the rehab facility yesterday.

## 2013-09-25 NOTE — ED Provider Notes (Signed)
CSN: [id number]     Arrival date & time 09/25/13  1529 History   First MD Initiated Contact with Patient 09/25/13 1539     Chief Complaint  Patient presents with  . Knee Pain  . Wound Check   (Consider location/radiation/quality/duration/timing/severity/associated sxs/prior Treatment) Patient is a 53 y.o. female presenting with knee pain and wound check. The history is provided by the patient and the spouse.  Knee Pain Associated symptoms: no fever   Wound Check Associated symptoms include chest pain. Pertinent negatives include no abdominal pain, no headaches and no shortness of breath.   patient status post open reduction internal fixation of left ankle fracture January 28 by Dr. Luna Glasgow. Patient discharged to rehabilitation facility in [facility] on Monday. Several months ago patient had surgery to the right distal femur for fracture. Patient reportedly had a fall at some point in time while in rehabilitation resulting in a laceration to the right knee this was repaired. Patient's husband went down there today was dissatisfied with the care and signed her out AMA and brought her back here for evaluation. Patient is reportedly complaining of chest pain on and off for the past several days. No other specific complaints other than pain to the left ankle at the place of surgery.  Past Medical History  Diagnosis Date  . Kidney stones 1989  . GERD (gastroesophageal reflux disease)   . History of stomach ulcers 2003/2007    Hx of esophageal ulcers and stomach ulcers due to reflux  . Back pain   . Chronic leg pain   . Polysubstance abuse   . Depression   . Anxiety   . Exertional shortness of breath   . History of blood transfusion     "recently had my 2nd; related to chemo" (01/21/2013)  . [id number])     "monthly" (01/21/2013)  . Breast cancer 06/08/12    right mastectomy  . Infiltrating ductal carcinoma of breast 07/09/2012    [place]:  chemo   Past Surgical History   Procedure Laterality Date  . Esophagogastroduodenoscopy    . Esophageal dilation    . Portacath placement  07/22/2012    Procedure: INSERTION PORT-A-CATH;  Surgeon: Donato Heinz, MD;  Location: [facility];  Service: General;  Laterality: Left;  left subclavian  . Esophagogastroduodenoscopy (egd) with esophageal dilation  10/04/2005    Rourk-Extensive geographic ulcerations distal third of the tubular esoophagus consistent with severe ulcerative relux esophagitis, early stricture formation status post dilation as described above. 2.  Multiple gastric ulcers without bleeding stigmata as described above. otherwise normal stomach.  3. large bulbar ulcer without bleeding stigmata. otherwise D1 and D2 appeared normal.  . Colonoscopy, esophagogastroduodenoscopy (egd) and esophageal dilation  08/21/2012    Procedure: COLONOSCOPY, ESOPHAGOGASTRODUODENOSCOPY (EGD) AND ESOPHAGEAL DILATION;  Surgeon: Daneil Dolin, MD;  Location: [facility];  Service: Endoscopy;  Laterality: N/A;  9:30  . Breast biopsy Right 04/2012  . Mastectomy Right 06/08/12  . Tubal ligation  1989  . Femur im nail Right 01/21/2013    Procedure: INTRAMEDULLARY (IM) RETROGRADE FEMORAL NAILING;  Surgeon: Hessie Dibble, MD;  Location: [facility];  Service: Orthopedics;  Laterality: Right;  . Orif ankle fracture Left 09/15/2013    Procedure: OPEN REDUCTION INTERNAL FIXATION (ORIF) LEFTANKLE FRACTURE;  Surgeon: Sanjuana Kava, MD;  Location: [facility];  Service: Orthopedics;  Laterality: Left;   Family History  Problem Relation Age of Onset  . Heart attack Father   . COPD Father   .  Alcohol abuse Brother   . Alcohol abuse Brother   . Diabetes Mother    History  Substance Use Topics  . Smoking status: Current Some Day Smoker -- 0.25 packs/day for 26 years    Last Attempt to Quit: 08/07/2011  . Smokeless tobacco: Never Used  . Alcohol Use: 0.0 oz/week     Comment: occ   OB History   Grav Para Term Preterm Abortions TAB SAB Ect Mult Living    6 6        6      Review of Systems  Constitutional: Negative for fever.  HENT: Negative for congestion.   Eyes: Negative for redness.  Respiratory: Negative for shortness of breath.   Cardiovascular: Positive for chest pain.  Gastrointestinal: Negative for nausea, vomiting and abdominal pain.  Genitourinary: Negative for dysuria.  Musculoskeletal: Positive for joint swelling.  Skin: Negative for rash.  Neurological: Negative for headaches.  Hematological: Does not bruise/bleed easily.  Psychiatric/Behavioral: Negative for confusion.    Allergies  Citalopram; Effexor; Motrin; and Trazodone and nefazodone  Home Medications   Current Outpatient Rx  Name  Route  Sig  Dispense  Refill  . docusate sodium (COLACE) 100 MG capsule   Oral   Take 100 mg by mouth 2 (two) times daily.         Marland Kitchen enoxaparin (LOVENOX) 40 MG/0.4ML injection   Subcutaneous   Inject 0.4 mLs (40 mg total) into the skin daily.   30 Syringe   0   . gabapentin (NEURONTIN) 300 MG capsule   Oral   Take 900 mg by mouth 2 (two) times daily.         . iron polysaccharides (NIFEREX) 150 MG capsule   Oral   Take 1 capsule (150 mg total) by mouth daily.   30 capsule   5   . omeprazole (PRILOSEC) 20 MG capsule   Oral   Take 1 capsule (20 mg total) by mouth daily.   30 capsule   2   . polyethylene glycol (MIRALAX / GLYCOLAX) packet   Oral   Take 17 g by mouth daily.         . potassium chloride SA (K-DUR,KLOR-CON) 20 MEQ tablet   Oral   Take 1 tablet (20 mEq total) by mouth daily.   30 tablet   3   . sucralfate (CARAFATE) 1 GM/10ML suspension   Oral   Take 1 g by mouth 4 (four) times daily.         . tamoxifen (NOLVADEX) 20 MG tablet   Oral   Take 1 tablet (20 mg total) by mouth daily.   30 tablet   11   . albuterol (PROVENTIL HFA;VENTOLIN HFA) 108 (90 BASE) MCG/ACT inhaler   Inhalation   Inhale 2 puffs into the lungs every 6 (six) hours as needed for wheezing.   1 Inhaler   2    . magnesium hydroxide (MILK OF MAGNESIA) 400 MG/5ML suspension   Oral   Take 30 mLs by mouth daily as needed for constipation.         Marland Kitchen oxyCODONE-acetaminophen (PERCOCET/ROXICET) 5-325 MG per tablet   Oral   Take 1 tablet by mouth every 4 (four) hours as needed for moderate pain or severe pain.   100 tablet   0    BP 120/75  Pulse 114  Temp(Src) 98.6 F (37 C) (Oral)  Resp 24  SpO2 97% Physical Exam  Nursing note and vitals reviewed. Constitutional: She appears well-developed  and well-nourished. No distress.  HENT:  Head: Normocephalic and atraumatic.  Mouth/Throat: Oropharynx is clear and moist.  Eyes: Conjunctivae and EOM are normal. Pupils are equal, round, and reactive to light.  Neck: Normal range of motion.  Cardiovascular: Normal rate and regular rhythm.   Pulmonary/Chest: Effort normal and breath sounds normal. No respiratory distress. She has no rales.  Abdominal: Soft. Bowel sounds are normal. There is no tenderness.  Musculoskeletal:  Left leg in a splint. Cap refill 2 seconds to the great toe. Has movement of toes. Right knee with 5 cm elliptical laceration this was sutured. Some ecchymosis to that area and contusion. No evidence of secondary infection.  Neurological: She is alert. No cranial nerve deficit. She exhibits normal muscle tone. Coordination normal.  Skin: Skin is warm.    ED Course  Procedures (including critical care time) Labs Review Labs Reviewed  PRO B NATRIURETIC PEPTIDE - Abnormal; Notable for the following:    Pro B Natriuretic peptide (BNP) 1957.0 (*)    All other components within normal limits  COMPREHENSIVE METABOLIC PANEL - Abnormal; Notable for the following:    Sodium 131 (*)    BUN 4 (*)    Creatinine, Ser 0.40 (*)    Calcium 7.8 (*)    Albumin 1.3 (*)    AST 49 (*)    Alkaline Phosphatase 180 (*)    Total Bilirubin 2.0 (*)    All other components within normal limits  PROTIME-INR - Abnormal; Notable for the following:     Prothrombin Time 17.1 (*)    All other components within normal limits  CBC WITH DIFFERENTIAL - Abnormal; Notable for the following:    WBC 10.8 (*)    RBC 2.54 (*)    Hemoglobin 9.5 (*)    HCT 30.4 (*)    MCV 119.7 (*)    MCH 37.4 (*)    RDW 18.5 (*)    Platelets 132 (*)    Neutrophils Relative % 86 (*)    Lymphocytes Relative 7 (*)    Basophils Relative 2 (*)    Neutro Abs 9.3 (*)    Basophils Absolute 0.2 (*)    All other components within normal limits  URINALYSIS, ROUTINE W REFLEX MICROSCOPIC - Abnormal; Notable for the following:    Color, Urine ORANGE (*)    APPearance CLOUDY (*)    Bilirubin Urine MODERATE (*)    Urobilinogen, UA 2.0 (*)    Nitrite POSITIVE (*)    Leukocytes, UA TRACE (*)    All other components within normal limits  URINE MICROSCOPIC-ADD ON - Abnormal; Notable for the following:    Bacteria, UA MANY (*)    All other components within normal limits  URINE CULTURE  TROPONIN I   Results for orders placed during the hospital encounter of 09/25/13  PRO B NATRIURETIC PEPTIDE      Result Value Range   Pro B Natriuretic peptide (BNP) 1957.0 (*) 0 - 125 pg/mL  TROPONIN I      Result Value Range   Troponin I <0.30  <0.30 ng/mL  COMPREHENSIVE METABOLIC PANEL      Result Value Range   Sodium 131 (*) 137 - 147 mEq/L   Potassium 3.8  3.7 - 5.3 mEq/L   Chloride 96  96 - 112 mEq/L   CO2 20  19 - 32 mEq/L   Glucose, Bld 97  70 - 99 mg/dL   BUN 4 (*) 6 - 23 mg/dL   Creatinine, Ser  0.40 (*) 0.50 - 1.10 mg/dL   Calcium 7.8 (*) 8.4 - 10.5 mg/dL   Total Protein 6.6  6.0 - 8.3 g/dL   Albumin 1.3 (*) 3.5 - 5.2 g/dL   AST 49 (*) 0 - 37 U/L   ALT 14  0 - 35 U/L   Alkaline Phosphatase 180 (*) 39 - 117 U/L   Total Bilirubin 2.0 (*) 0.3 - 1.2 mg/dL   GFR calc non Af Amer >90  >90 mL/min   GFR calc Af Amer >90  >90 mL/min  PROTIME-INR      Result Value Range   Prothrombin Time 17.1 (*) 11.6 - 15.2 seconds   INR 1.43  0.00 - 1.49  CBC WITH DIFFERENTIAL       Result Value Range   WBC 10.8 (*) 4.0 - 10.5 K/uL   RBC 2.54 (*) 3.87 - 5.11 MIL/uL   Hemoglobin 9.5 (*) 12.0 - 15.0 g/dL   HCT 30.4 (*) 36.0 - 46.0 %   MCV 119.7 (*) 78.0 - 100.0 fL   MCH 37.4 (*) 26.0 - 34.0 pg   MCHC 31.3  30.0 - 36.0 g/dL   RDW 18.5 (*) 11.5 - 15.5 %   Platelets 132 (*) 150 - 400 K/uL   Neutrophils Relative % 86 (*) 43 - 77 %   Lymphocytes Relative 7 (*) 12 - 46 %   Monocytes Relative 3  3 - 12 %   Eosinophils Relative 2  0 - 5 %   Basophils Relative 2 (*) 0 - 1 %   Band Neutrophils 0  0 - 10 %   Metamyelocytes Relative 0     Myelocytes 0     Promyelocytes Absolute 0     Blasts 0     nRBC 0  0 /100 WBC   Neutro Abs 9.3 (*) 1.7 - 7.7 K/uL   Lymphs Abs 0.8  0.7 - 4.0 K/uL   Monocytes Absolute 0.3  0.1 - 1.0 K/uL   Eosinophils Absolute 0.2  0.0 - 0.7 K/uL   Basophils Absolute 0.2 (*) 0.0 - 0.1 K/uL  URINALYSIS, ROUTINE W REFLEX MICROSCOPIC      Result Value Range   Color, Urine ORANGE (*) YELLOW   APPearance CLOUDY (*) CLEAR   Specific Gravity, Urine 1.025  1.005 - 1.030   pH 6.0  5.0 - 8.0   Glucose, UA NEGATIVE  NEGATIVE mg/dL   Hgb urine dipstick NEGATIVE  NEGATIVE   Bilirubin Urine MODERATE (*) NEGATIVE   Ketones, ur NEGATIVE  NEGATIVE mg/dL   Protein, ur NEGATIVE  NEGATIVE mg/dL   Urobilinogen, UA 2.0 (*) 0.0 - 1.0 mg/dL   Nitrite POSITIVE (*) NEGATIVE   Leukocytes, UA TRACE (*) NEGATIVE  URINE MICROSCOPIC-ADD ON      Result Value Range   WBC, UA TOO NUMEROUS TO COUNT  <3 WBC/hpf   Bacteria, UA MANY (*) RARE    Imaging Review Dg Knee 2 Views Right  09/25/2013   CLINICAL DATA:  Knee pain. Wound check, swelling and discharge from the incision site.  EXAM: RIGHT KNEE - 1-2 VIEW  COMPARISON:  01/26/2013  FINDINGS: Postoperative changes noted within the visualized mid to distal right femur. Fracture line remains partially evident medially. Heterotopic bone or exuberant callus formation noted along the medial femoral metaphysis. Small joint effusion. No  bony changes noted to suggest osteomyelitis.  IMPRESSION: Hardware in the distal femur. Fracture line remains at least partially evident medially with overlying heterotopic bone or exuberant callus  formation.   Electronically Signed   By: Rolm Baptise M.D.   On: 09/25/2013 16:47   Dg Ankle 2 Views Left  09/25/2013   CLINICAL DATA:  Pain ; recent trauma  EXAM: LEFT ANKLE - 2 VIEW  COMPARISON:  September 16, 2003  FINDINGS: Frontal and lateral views were obtained. There is an overlying cast. There are skin staples medially and laterally. There is screw and plate fixation through a fracture of the distal fibula with alignment essentially anatomic. No new fracture or dislocation apparent. No erosive change or bony destruction. Joint spaces appear intact.  IMPRESSION: Postoperative change. No new fracture. Ankle mortise appears intact. No bony destruction.   Electronically Signed   By: Lowella Grip M.D.   On: 09/25/2013 16:40   Ct Angio Chest Pe W/cm &/or Wo Cm  09/25/2013   CLINICAL DATA:  53 year old female with shortness of breath, chest pain and weakness.  EXAM: CT ANGIOGRAPHY CHEST WITH CONTRAST  TECHNIQUE: Multidetector CT imaging of the chest was performed using the standard protocol during bolus administration of intravenous contrast. Multiplanar CT image reconstructions including MIPs were obtained to evaluate the vascular anatomy.  CONTRAST:  184mL OMNIPAQUE IOHEXOL 350 MG/ML SOLN  COMPARISON:  09/25/2012 and prior chest radiographs  FINDINGS: This is a technically satisfactory study.  No pulmonary emboli are identified.  There is no evidence of thoracic aortic aneurysm/dissection.  Cardiomegaly is present.  Small bilateral pleural effusions are identified. There is no evidence of pericardial effusion.  Ground-glass opacities within both lungs identified - favor pulmonary edema over pneumonia.  Confluent opacities along the the anterior right upper and middle lobes are probably related to  posttreatment/radiation changes.  Mild bibasilar atelectasis is present.  Diffuse subcutaneous edema is noted.  Patient is status post right mastectomy.  No enlarged lymph nodes are identified.  Diffuse fatty infiltration of the liver is present.  A small to moderate hiatal hernia is identified.  No acute bony abnormalities are noted.  Review of the MIP images confirms the above findings.  IMPRESSION: No evidence of pulmonary emboli or thoracic aortic dissection.  Bilateral ground-glass opacities -favor edema over infection.  Cardiomegaly with small bilateral pleural effusions, diffuse subcutaneous edema and mild bibasilar atelectasis.  Right mastectomy and likely posttreatment/radiation changes within the anterior right lung.  Diffuse fatty infiltration of the liver and small to moderate hiatal hernia.   Electronically Signed   By: Hassan Rowan M.D.   On: 09/25/2013 21:38   Dg Chest Portable 1 View  09/25/2013   CLINICAL DATA:  Shortness of breath and fever  EXAM: PORTABLE CHEST - 1 VIEW  COMPARISON:  January 21, 2013  FINDINGS: Heart is prominent with interstitial edema. There is no airspace consolidation. The pulmonary vascularity is normal.  Port-A-Cath tip is in the superior cava. No pneumothorax. There are surgical clips in the right axillary region.  IMPRESSION: Heart is prominent with interstitial edema. Suspect a degree of congestive heart failure. There is no apparent airspace consolidation.   Electronically Signed   By: Lowella Grip M.D.   On: 09/25/2013 15:58    EKG Interpretation    Date/Time:  Saturday September 25 2013 15:45:43 EST Ventricular Rate:  108 PR Interval:  126 QRS Duration: 66 QT Interval:  368 QTC Calculation: 493 R Axis:   3 Text Interpretation:  Sinus tachycardia with Premature supraventricular complexes and with occasional Premature ventricular complexes Low voltage QRS Borderline ECG When compared with ECG of 15-Sep-2013 12:48, Premature ventricular complexes are now  Present  Confirmed by Sharley Keeler  MD, [place] ([id number]) on 09/25/2013 4:08:24 PM            MDM   1. Pulmonary edema   2. UTI (lower urinary tract infection)    The patient was pulled out of the rehabilitation facility in [place] appear by family. Patient currently belongs in rehabilitation and has no way to get back in rehabilitation. Patient's workup here does show some mild pulmonary edema troponin negative EKG without any acute cardiac changes other than some premature ventricular complexes. As stated troponin was negative. CT angios showed no evidence of pulmonary embolism. Patient has had the complaint of some chest pain for several days on and off.   patient urinalysis consistent with urinary tract infection. Treated here with 1 g of Rocephin. Patient also for the mild pulmonary edema given 40 mg of Lasix IV. We'll discuss with hospitalist for a readmission and eventual replacement back into rehabilitation.  X-rays of the open reduction internal fixation surgery done by Dr. Luna Glasgow without any acute changes. Splint in place. No evidence of any problems with wound care. Patient also apparently had a fall in the rehabilitation center we don't know when resulting in a laceration to her right knee. X-rays of the knee show no bony abnormalities. Patient's had a prior several months ago repair of a femur fracture.  Patient stable from the orthopedic standpoint.    Mervin Kung, MD 09/25/13 2159

## 2013-09-26 ENCOUNTER — Encounter (HOSPITAL_COMMUNITY): Payer: Self-pay | Admitting: Internal Medicine

## 2013-09-26 DIAGNOSIS — R609 Edema, unspecified: Secondary | ICD-10-CM

## 2013-09-26 DIAGNOSIS — R740 Nonspecific elevation of levels of transaminase and lactic acid dehydrogenase [LDH]: Secondary | ICD-10-CM

## 2013-09-26 DIAGNOSIS — E8809 Other disorders of plasma-protein metabolism, not elsewhere classified: Secondary | ICD-10-CM | POA: Diagnosis present

## 2013-09-26 DIAGNOSIS — I517 Cardiomegaly: Secondary | ICD-10-CM

## 2013-09-26 DIAGNOSIS — R601 Generalized edema: Secondary | ICD-10-CM | POA: Diagnosis present

## 2013-09-26 DIAGNOSIS — K760 Fatty (change of) liver, not elsewhere classified: Secondary | ICD-10-CM

## 2013-09-26 DIAGNOSIS — D7589 Other specified diseases of blood and blood-forming organs: Secondary | ICD-10-CM | POA: Diagnosis present

## 2013-09-26 DIAGNOSIS — K7689 Other specified diseases of liver: Secondary | ICD-10-CM

## 2013-09-26 DIAGNOSIS — J810 Acute pulmonary edema: Principal | ICD-10-CM

## 2013-09-26 DIAGNOSIS — D696 Thrombocytopenia, unspecified: Secondary | ICD-10-CM | POA: Diagnosis present

## 2013-09-26 DIAGNOSIS — R7401 Elevation of levels of liver transaminase levels: Secondary | ICD-10-CM | POA: Diagnosis present

## 2013-09-26 DIAGNOSIS — D509 Iron deficiency anemia, unspecified: Secondary | ICD-10-CM

## 2013-09-26 DIAGNOSIS — E871 Hypo-osmolality and hyponatremia: Secondary | ICD-10-CM | POA: Diagnosis present

## 2013-09-26 HISTORY — DX: Iron deficiency anemia, unspecified: D50.9

## 2013-09-26 HISTORY — DX: Fatty (change of) liver, not elsewhere classified: K76.0

## 2013-09-26 HISTORY — DX: Other specified diseases of blood and blood-forming organs: D75.89

## 2013-09-26 LAB — CBC WITH DIFFERENTIAL/PLATELET
Basophils Absolute: 0.1 10*3/uL (ref 0.0–0.1)
Basophils Relative: 1 % (ref 0–1)
Eosinophils Absolute: 0.1 10*3/uL (ref 0.0–0.7)
Eosinophils Relative: 1 % (ref 0–5)
HEMATOCRIT: 32.1 % — AB (ref 36.0–46.0)
Hemoglobin: 10.2 g/dL — ABNORMAL LOW (ref 12.0–15.0)
LYMPHS ABS: 2.3 10*3/uL (ref 0.7–4.0)
Lymphocytes Relative: 21 % (ref 12–46)
MCH: 37.9 pg — ABNORMAL HIGH (ref 26.0–34.0)
MCHC: 31.8 g/dL (ref 30.0–36.0)
MCV: 119.3 fL — AB (ref 78.0–100.0)
Monocytes Absolute: 0.9 10*3/uL (ref 0.1–1.0)
Monocytes Relative: 8 % (ref 3–12)
NEUTROS ABS: 7.7 10*3/uL (ref 1.7–7.7)
Neutrophils Relative %: 69 % (ref 43–77)
PLATELETS: 103 10*3/uL — AB (ref 150–400)
RBC: 2.69 MIL/uL — ABNORMAL LOW (ref 3.87–5.11)
RDW: 18.6 % — AB (ref 11.5–15.5)
Smear Review: ADEQUATE
WBC: 11.1 10*3/uL — ABNORMAL HIGH (ref 4.0–10.5)

## 2013-09-26 MED ORDER — FUROSEMIDE 40 MG PO TABS: 60.0000 mg | ORAL_TABLET | Freq: Once | ORAL | Status: DC

## 2013-09-26 MED ORDER — HYDROMORPHONE HCL PF 1 MG/ML IJ SOLN: 0.5000 mg | INTRAMUSCULAR | Status: DC | PRN

## 2013-09-26 MED ORDER — ALBUTEROL SULFATE (2.5 MG/3ML) 0.083% IN NEBU
2.5000 mg | INHALATION_SOLUTION | Freq: Four times a day (QID) | RESPIRATORY_TRACT | Status: DC
  Administered 2013-09-26 (×3): 2.5 mg via RESPIRATORY_TRACT
  Filled 2013-09-26 (×2): qty 3

## 2013-09-26 MED ORDER — POTASSIUM CHLORIDE CRYS ER 20 MEQ PO TBCR
20.0000 meq | EXTENDED_RELEASE_TABLET | Freq: Two times a day (BID) | ORAL | Status: DC
  Administered 2013-09-26 – 2013-09-27 (×3): 20 meq via ORAL
  Filled 2013-09-26: qty 1
  Filled 2013-09-26: qty 2
  Filled 2013-09-26: qty 1

## 2013-09-26 MED ORDER — ENOXAPARIN SODIUM 40 MG/0.4ML ~~LOC~~ SOLN
40.0000 mg | SUBCUTANEOUS | Status: DC
  Administered 2013-09-26: 40 mg via SUBCUTANEOUS
  Filled 2013-09-26: qty 0.4

## 2013-09-26 MED ORDER — FUROSEMIDE 10 MG/ML IJ SOLN
20.0000 mg | Freq: Two times a day (BID) | INTRAMUSCULAR | Status: DC
  Filled 2013-09-26: qty 2

## 2013-09-26 MED ORDER — ALBUTEROL SULFATE (2.5 MG/3ML) 0.083% IN NEBU
3.0000 mL | INHALATION_SOLUTION | Freq: Four times a day (QID) | RESPIRATORY_TRACT | Status: DC | PRN
  Filled 2013-09-26: qty 3

## 2013-09-26 MED ORDER — OXYCODONE-ACETAMINOPHEN 5-325 MG PO TABS
2.0000 | ORAL_TABLET | ORAL | Status: DC | PRN
  Administered 2013-09-27 – 2013-09-28 (×4): 2 via ORAL
  Filled 2013-09-26 (×4): qty 2

## 2013-09-26 MED ORDER — BENZONATATE 100 MG PO CAPS
100.0000 mg | ORAL_CAPSULE | Freq: Three times a day (TID) | ORAL | Status: DC
  Administered 2013-09-26 – 2013-09-28 (×8): 100 mg via ORAL
  Filled 2013-09-26 (×8): qty 1

## 2013-09-26 MED ORDER — FUROSEMIDE 10 MG/ML IJ SOLN
30.0000 mg | Freq: Two times a day (BID) | INTRAMUSCULAR | Status: DC
  Administered 2013-09-26 – 2013-09-27 (×2): 30 mg via INTRAVENOUS
  Filled 2013-09-26 (×2): qty 4

## 2013-09-26 NOTE — Progress Notes (Signed)
[facility] PROGRESS NOTE  Elizabeth Orr WUX:[id number] DOB: 1960/12/31 DOA: 09/25/2013 PCP: Dionisio Paschal, NP    Code Status: Full code Family Communication: Discussed with husband. Disposition Plan: To be determined.   Consultants:  Orthopedics, Dr. Luna Glasgow  Procedures:  None  Antibiotics:  Rocephin 09/25/13>>  HPI/Subjective: The patient complains of a nonproductive cough. She complains of generalized swelling. Overall, she does not feel well. She denies chest pain currently.  Objective: Filed Vitals:   09/26/13 1329  BP: 108/65  Pulse: 99  Temp: 98.8 F (37.1 C)  Resp: 19    Intake/Output Summary (Last 24 hours) at 09/26/13 1401 Last data filed at 09/26/13 1329  Gross per 24 hour  Intake    230 ml  Output    415 ml  Net   -185 ml   Filed Weights   09/26/13 0049  Weight: 98.2 kg (216 lb 7.9 oz)    Exam:   General:  Pleasant overweight 53 year old Latina woman sitting up in bed, in no acute distress. She appears ill.  Cardiovascular: S1, S2, systolic murmur.  Respiratory: Bilateral fine crackles. Several wet coughs, without expectoration. Breathing is nonlabored.  Abdomen: Positive bowel sounds, obese, mildly distended and query mild ascites, and mild generalized tenderness without guarding.  Musculoskeletal:  Left lower extremity with posterior splint in place. The patient is able to wiggle her toes. Right knee with sutures in place and mild ecchymosis. Mildly tender.  Neuropsychiatric: She has a flat affect. She is alert and oriented x3. Her speech is clear. Cranial nerves II through XII are grossly intact.   Data Reviewed: Basic Metabolic Panel:  Recent Labs Lab 09/25/13 1656  NA 131*  K 3.8  CL 96  CO2 20  GLUCOSE 97  BUN 4*  CREATININE 0.40*  CALCIUM 7.8*   Liver Function Tests:  Recent Labs Lab 09/25/13 1656  AST 49*  ALT 14  ALKPHOS 180*  BILITOT 2.0*  PROT 6.6  ALBUMIN 1.3*   No results found for this basename:  LIPASE, AMYLASE,  in the last 168 hours No results found for this basename: AMMONIA,  in the last 168 hours CBC:  Recent Labs Lab 09/25/13 1656  WBC 10.8*  NEUTROABS 9.3*  HGB 9.5*  HCT 30.4*  MCV 119.7*  PLT 132*   Cardiac Enzymes:  Recent Labs Lab 09/25/13 1656  TROPONINI <0.30   BNP (last 3 results)  Recent Labs  09/25/13 1656  PROBNP 1957.0*   CBG: No results found for this basename: GLUCAP,  in the last 168 hours  No results found for this or any previous visit (from the past 240 hour(s)).   Studies: Dg Knee 2 Views Right  09/25/2013   CLINICAL DATA:  Knee pain. Wound check, swelling and discharge from the incision site.  EXAM: RIGHT KNEE - 1-2 VIEW  COMPARISON:  01/26/2013  FINDINGS: Postoperative changes noted within the visualized mid to distal right femur. Fracture line remains partially evident medially. Heterotopic bone or exuberant callus formation noted along the medial femoral metaphysis. Small joint effusion. No bony changes noted to suggest osteomyelitis.  IMPRESSION: Hardware in the distal femur. Fracture line remains at least partially evident medially with overlying heterotopic bone or exuberant callus formation.   Electronically Signed   By: Rolm Baptise M.D.   On: 09/25/2013 16:47   Dg Ankle 2 Views Left  09/25/2013   CLINICAL DATA:  Pain ; recent trauma  EXAM: LEFT ANKLE - 2 VIEW  COMPARISON:  September 16, 2003  FINDINGS: Frontal and lateral views were obtained. There is an overlying cast. There are skin staples medially and laterally. There is screw and plate fixation through a fracture of the distal fibula with alignment essentially anatomic. No new fracture or dislocation apparent. No erosive change or bony destruction. Joint spaces appear intact.  IMPRESSION: Postoperative change. No new fracture. Ankle mortise appears intact. No bony destruction.   Electronically Signed   By: Lowella Grip M.D.   On: 09/25/2013 16:40   Ct Angio Chest Pe W/cm &/or Wo  Cm  09/25/2013   CLINICAL DATA:  53 year old female with shortness of breath, chest pain and weakness.  EXAM: CT ANGIOGRAPHY CHEST WITH CONTRAST  TECHNIQUE: Multidetector CT imaging of the chest was performed using the standard protocol during bolus administration of intravenous contrast. Multiplanar CT image reconstructions including MIPs were obtained to evaluate the vascular anatomy.  CONTRAST:  164mL OMNIPAQUE IOHEXOL 350 MG/ML SOLN  COMPARISON:  09/25/2012 and prior chest radiographs  FINDINGS: This is a technically satisfactory study.  No pulmonary emboli are identified.  There is no evidence of thoracic aortic aneurysm/dissection.  Cardiomegaly is present.  Small bilateral pleural effusions are identified. There is no evidence of pericardial effusion.  Ground-glass opacities within both lungs identified - favor pulmonary edema over pneumonia.  Confluent opacities along the the anterior right upper and middle lobes are probably related to posttreatment/radiation changes.  Mild bibasilar atelectasis is present.  Diffuse subcutaneous edema is noted.  Patient is status post right mastectomy.  No enlarged lymph nodes are identified.  Diffuse fatty infiltration of the liver is present.  A small to moderate hiatal hernia is identified.  No acute bony abnormalities are noted.  Review of the MIP images confirms the above findings.  IMPRESSION: No evidence of pulmonary emboli or thoracic aortic dissection.  Bilateral ground-glass opacities -favor edema over infection.  Cardiomegaly with small bilateral pleural effusions, diffuse subcutaneous edema and mild bibasilar atelectasis.  Right mastectomy and likely posttreatment/radiation changes within the anterior right lung.  Diffuse fatty infiltration of the liver and small to moderate hiatal hernia.   Electronically Signed   By: Hassan Rowan M.D.   On: 09/25/2013 21:38   Dg Chest Portable 1 View  09/25/2013   CLINICAL DATA:  Shortness of breath and fever  EXAM: PORTABLE  CHEST - 1 VIEW  COMPARISON:  January 21, 2013  FINDINGS: Heart is prominent with interstitial edema. There is no airspace consolidation. The pulmonary vascularity is normal.  Port-A-Cath tip is in the superior cava. No pneumothorax. There are surgical clips in the right axillary region.  IMPRESSION: Heart is prominent with interstitial edema. Suspect a degree of congestive heart failure. There is no apparent airspace consolidation.   Electronically Signed   By: Lowella Grip M.D.   On: 09/25/2013 15:58    Scheduled Meds: . albuterol  2.5 mg Nebulization Q6H  . aspirin EC  325 mg Oral Daily  . benzonatate  100 mg Oral TID  . cefTRIAXone (ROCEPHIN)  IV  1 g Intravenous Q24H  . docusate sodium  100 mg Oral BID  . enoxaparin  40 mg Subcutaneous Q24H  . furosemide  30 mg Intravenous BID  . gabapentin  900 mg Oral BID  . iron polysaccharides  150 mg Oral Daily  . pantoprazole  40 mg Oral Daily  . polyethylene glycol  17 g Oral Daily  . potassium chloride  20 mEq Oral BID  . sucralfate  1 g Oral QID  . tamoxifen  20 mg Oral Daily   Continuous Infusions:   Assessment:  Principal Problem:   Chest pain Active Problems:   Pulmonary edema, postoperative   Anasarca   UTI (lower urinary tract infection)   Hepatic steatosis   Bimalleolar ankle fracture   Polysubstance abuse   Transaminitis   Hypoalbuminemia   Anemia, iron deficiency   Macrocytosis   Thrombocytopenia, unspecified   Hyponatremia   1. Chest pain. Her chest pain appears to have been atypical. CT angiogram of the chest was negative for PE. She has no complaints of chest pain currently. Her troponin I was negative. The chest pain may be secondary to acute pulmonary edema from IV fluids given during the previous hospitalization. Her 2-D echocardiogram reveals an ejection fraction of 60-65%, normal wall motion, and normal diastolic function. We'll continue gentle IV Lasix to mobilize more body fluid.  Acute pulmonary edema with  anasarca. CT angiogram of the chest revealed pulmonary edema, small bilateral pleural effusions, and diffuse subcutaneous edema. 2-D echocardiogram results noted as above. Anasarca may be secondary to volume resuscitation during the previous hospitalization for her right ankle surgery and hypoalbuminemia. Will restart Lasix IV at 20 mg every 12 hours. Albuterol nebulizer and Tussionex ordered for symptomatic treatment.  Hyponatremia, likely secondary to volume overload. Continue Lasix as above.  Hyperbilirubinemia, hypoalbuminemia and mild transaminitis. Possibly secondary to volume overload and fatty liver disease. Will order viral hepatitis panel and other hepatic studies.  Macrocytic anemia. This may be secondary to her previous history of chemotherapy for breast cancer. She is followed by oncology for anemia and history of breast cancer. Her ferritin was 507, total iron 96, and TIBC was 123 on 07/06/13. Vitamin B12 level ordered and currently pending.  Thrombocytopenia. Decreased from 178 during previous hospitalization last week. She was discharged on Lovenox on 09/20/13. Her platelet count will be followed. TSH and vitamin B12 ordered for further evaluation.  UTI. Urine culture pending. Continue Rocephin.  Recent left ankle fracture, status post surgery. She has a splint in place. Dr. Luna Glasgow has seen the patient and plans to change the splint to a short leg cast with removal of sutures in the next day or 2.  Status post fall at the nursing facility, resulting in laceration of right patella area. Currently stable with sutures in place.  History of peptic ulcer disease. We'll continue Carafate and Protonix.    Plan: 1. As above. 2. Continue to monitor her urine output. We'll insert a Foley catheter for strict I.'s and O.'s. 3. Continue to monitor her blood counts. 4. Order viral hepatitis panel, ANA, antimitochondrial antibodies, ceruloplasmin. 5. PT evaluation ordered and  pending.   Time spent: 40 minutes.    [facility] Pager [phone]. If 7PM-7AM, please contact night-coverage at [email or web address], password [facility] 09/26/2013, 2:01 PM  LOS: 1 day

## 2013-09-26 NOTE — Progress Notes (Signed)
Subjective: My ankle is ok.     Objective: Vital signs in last 24 hours: Temp:  [97.6 F (36.4 C)-98.6 F (37 C)] 97.6 F (36.4 C) (02/08 1026) Pulse Rate:  [101-114] 101 (02/08 1026) Resp:  [14-25] 18 (02/08 1026) BP: (105-144)/(59-88) 117/70 mmHg (02/08 1026) SpO2:  [94 %-100 %] 100 % (02/08 1026) Weight:  [98.2 kg (216 lb 7.9 oz)] 98.2 kg (216 lb 7.9 oz) (02/08 0049)  Intake/Output from previous day: 02/07 0701 - 02/08 0700 In: -  Out: 415 [Urine:415] Intake/Output this shift:     Recent Labs  09/25/13 1656  HGB 9.5*    Recent Labs  09/25/13 1656  WBC 10.8*  RBC 2.54*  HCT 30.4*  PLT 132*    Recent Labs  09/25/13 1656  NA 131*  K 3.8  CL 96  CO2 20  BUN 4*  CREATININE 0.40*  GLUCOSE 97  CALCIUM 7.8*    Recent Labs  09/25/13 1656  INR 1.43    Neurologically intact Neurovascular intact Sensation intact distally Intact pulses distally  She has posterior splint on the left in good position and alignment.  Her x-rays of the left ankle look good.  NV is intact.  I had planned to see her in the office later in the week.  I will change to short leg cast with removal of sutures in the next day or two.  She has a new injury, sutures of the right patella area.  Family does not know what happened to have the sutures there.  The patient does not know.  Wound looks good, no erythema.  She had a femur fracture in June of last year rodded.  She has been weak and just was using the walker when she fell and broke the left ankle almost two weeks ago.    She will need PT, toe touch on left.  Assessment/Plan: PT. To change cast in next day or two on the left.     Elizabeth Orr 09/26/2013, 10:49 AM

## 2013-09-26 NOTE — Plan of Care (Signed)
Attempted to try new IV placement X5 times (3 RN's including ED RN) - no luck.  Pt veins extremely small and diff to find with fluid build up.  Will notify [facility].

## 2013-09-26 NOTE — Progress Notes (Signed)
Utilization review completed.  

## 2013-09-27 ENCOUNTER — Observation Stay (HOSPITAL_COMMUNITY): Payer: Medicaid Other

## 2013-09-27 ENCOUNTER — Encounter (HOSPITAL_COMMUNITY): Payer: Self-pay | Admitting: Internal Medicine

## 2013-09-27 DIAGNOSIS — F1020 Alcohol dependence, uncomplicated: Secondary | ICD-10-CM

## 2013-09-27 DIAGNOSIS — D696 Thrombocytopenia, unspecified: Secondary | ICD-10-CM

## 2013-09-27 DIAGNOSIS — E876 Hypokalemia: Secondary | ICD-10-CM | POA: Diagnosis not present

## 2013-09-27 HISTORY — DX: Alcohol dependence, uncomplicated: F10.20

## 2013-09-27 LAB — COMPREHENSIVE METABOLIC PANEL
ALBUMIN: 1.2 g/dL — AB (ref 3.5–5.2)
ALT: 11 U/L (ref 0–35)
AST: 42 U/L — ABNORMAL HIGH (ref 0–37)
Alkaline Phosphatase: 156 U/L — ABNORMAL HIGH (ref 39–117)
BUN: 5 mg/dL — ABNORMAL LOW (ref 6–23)
CALCIUM: 7.4 mg/dL — AB (ref 8.4–10.5)
CO2: 26 mEq/L (ref 19–32)
Chloride: 100 mEq/L (ref 96–112)
Creatinine, Ser: 0.5 mg/dL (ref 0.50–1.10)
GFR calc non Af Amer: >90 mL/min (ref >90)
GLUCOSE: 90 mg/dL (ref 70–99)
Potassium: 2.9 mEq/L — CL (ref 3.7–5.3)
SODIUM: 137 meq/L (ref 137–147)
Total Bilirubin: 1.3 mg/dL — ABNORMAL HIGH (ref 0.3–1.2)
Total Protein: 5.7 g/dL — ABNORMAL LOW (ref 6.0–8.3)

## 2013-09-27 LAB — HEPATITIS PANEL, ACUTE
HCV Ab: NEGATIVE
HEP B S AG: NEGATIVE
Hep A IgM: NONREACTIVE
Hep B C IgM: NONREACTIVE

## 2013-09-27 LAB — VITAMIN B12: VITAMIN B 12: 472 pg/mL (ref 211–911)

## 2013-09-27 LAB — MAGNESIUM: Magnesium: 2.1 mg/dL (ref 1.5–2.5)

## 2013-09-27 LAB — TSH: TSH: 0.524 u[IU]/mL (ref 0.350–4.500)

## 2013-09-27 MED ORDER — VITAMIN B-1 100 MG PO TABS
100.0000 mg | ORAL_TABLET | Freq: Every day | ORAL | Status: DC
  Administered 2013-09-27 – 2013-09-28 (×2): 100 mg via ORAL
  Filled 2013-09-27 (×3): qty 1

## 2013-09-27 MED ORDER — POTASSIUM CHLORIDE CRYS ER 10 MEQ PO TBCR: 30.0000 meq | EXTENDED_RELEASE_TABLET | Freq: Two times a day (BID) | ORAL | Status: DC

## 2013-09-27 MED ORDER — POTASSIUM CHLORIDE 20 MEQ/15ML (10%) PO LIQD
40.0000 meq | Freq: Once | ORAL | Status: AC
  Administered 2013-09-27: 40 meq via ORAL
  Filled 2013-09-27: qty 30

## 2013-09-27 MED ORDER — POTASSIUM CHLORIDE CRYS ER 20 MEQ PO TBCR
20.0000 meq | EXTENDED_RELEASE_TABLET | Freq: Two times a day (BID) | ORAL | Status: AC
  Administered 2013-09-27 – 2013-09-28 (×2): 20 meq via ORAL
  Filled 2013-09-27 (×2): qty 1

## 2013-09-27 MED ORDER — TAB-A-VITE/IRON PO TABS
1.0000 | ORAL_TABLET | Freq: Every day | ORAL | Status: DC
  Administered 2013-09-27 – 2013-09-28 (×2): 1 via ORAL
  Filled 2013-09-27 (×3): qty 1

## 2013-09-27 MED ORDER — FUROSEMIDE 20 MG PO TABS
10.0000 mg | ORAL_TABLET | Freq: Every day | ORAL | Status: DC
  Administered 2013-09-28: 10 mg via ORAL
  Filled 2013-09-27: qty 1

## 2013-09-27 MED ORDER — SPIRONOLACTONE 25 MG PO TABS
12.5000 mg | ORAL_TABLET | Freq: Every day | ORAL | Status: DC
  Administered 2013-09-28: 12.5 mg via ORAL
  Filled 2013-09-27: qty 1

## 2013-09-27 MED ORDER — ALBUTEROL SULFATE (2.5 MG/3ML) 0.083% IN NEBU
2.5000 mg | INHALATION_SOLUTION | Freq: Four times a day (QID) | RESPIRATORY_TRACT | Status: DC
  Administered 2013-09-27 – 2013-09-28 (×7): 2.5 mg via RESPIRATORY_TRACT
  Filled 2013-09-27 (×7): qty 3

## 2013-09-27 MED ORDER — ASPIRIN EC 81 MG PO TBEC
162.0000 mg | DELAYED_RELEASE_TABLET | Freq: Every day | ORAL | Status: DC
  Administered 2013-09-28: 162 mg via ORAL
  Filled 2013-09-27: qty 2

## 2013-09-27 NOTE — Evaluation (Signed)
Physical Therapy Evaluation Patient Details Name: Elizabeth Orr MRN: [id number] DOB: 11/13/1960 Today's Date: 09/27/2013 Time: 4098-1191 PT Time Calculation (min): 62 min  PT Assessment / Plan / Recommendation History of Present Illness  Pt is readmitted to the hospital due to c/o chest pain.  She had been removed from the SNF from which she had discharged to  by her husband.  He was unhappy about the care she received there.  During her stay, she somehow fell on her right knee and sustained a laceration for which sutures were require.  He stated that he took her to  a motel and had to get her "cleaned up".  Later on that day, she c/o chest pain so he had her treansferred here..  Pt is very confused and very tearful.  Earlier, she stated that she was too weak to feed herself.  Currently she states that she feels too weak to try to transfer.  Clinical Impression   Pt's cognitive status is dramatically changed from that of last admission.  Today, she does not know where she is or why she is here.  About 30 minutes after she had eaten lunch she asked me when lunch would be served.  She cried uncontrollably during my visit stating that she was afraid of falling.  She had no recollection of using a sliding board last week (this was done multiple times).  She is at least a full grade weaker in both LEs than from that seen last week.  Sliding board transfer bed to chair took max to total assist of 2 to complete.  She had no understanding of teaching done.  Clearly, her husband was able to get her in and out of his truck when he discharged her from SNF.  I do not have any idea how he managed this as she was "dead weight" for me.  Perhaps when she is at home, her cognition will improve.  They may need a hoyer lift at home to manage her initially.  This will be up to husband.  If she could have some HHPT at d/c, that would be excellent.  We will try to do what we can while here, but her rehab prospect is grim at  this point.    PT Assessment  Patient needs continued PT services    Follow Up Recommendations  Home health PT    Does the patient have the potential to tolerate intense rehabilitation      Barriers to Discharge Inaccessible home environment      [facility] bed;Other (comment) (sliding board or hoyer lift)    Recommendations for Other Services     Frequency Min 5X/week    Precautions / Restrictions Precautions Precautions: Fall Restrictions Weight Bearing Restrictions: Yes RLE Weight Bearing: Weight bearing as tolerated RLE Partial Weight Bearing Percentage or Pounds: old femoral fx. LLE Weight Bearing: Touchdown weight bearing Other Position/Activity Restrictions: new ankle fx.   Pertinent Vitals/Pain       Mobility  Bed Mobility Overal bed mobility: Needs Assistance Bed Mobility: Supine to Sit Sidelying to sit: Max assist Supine to sit: Max assist General bed mobility comments: pt needs full assist to move both LEs in the bed as well as her trunk Transfers Overall transfer level: Needs assistance Equipment used:  (sliding board) Transfers: Lateral/Scoot Transfers (using a sliding board)  Lateral/Scoot Transfers: +2 physical assistance;Total assist General transfer comment: pt extremely tearful and crying that she is afraid of falling.   she seems  to have NO recollection of haveing used a sliding board last week...she needed total assist x2 to try to get her back to bed with sliding board    Exercises General Exercises - Lower Extremity Ankle Circles/Pumps: AROM;Right;10 reps;Supine Quad Sets: AROM;Both;10 reps;Supine Short Arc Quad: Both;AAROM;10 reps;Supine Heel Slides: AAROM;Both;10 reps;Supine Hip ABduction/ADduction: AAROM;Both;10 reps   PT Diagnosis: Generalized weakness  PT Problem List: Decreased strength;Decreased activity tolerance;Decreased mobility;Decreased cognition;Decreased knowledge of use of DME;Obesity PT Treatment  Interventions:       PT Goals(Current goals can be found in the care plan section) Acute Rehab PT Goals Patient Stated Goal: none stated PT Goal Formulation: Patient unable to participate in goal setting Time For Goal Achievement: 10/11/13 Potential to Achieve Goals: Fair  Visit Information  Last PT Received On: 09/27/13 History of Present Illness: Pt is readmitted to the hospital due to c/o chest pain.  She had been removed from the SNF from which she had discharged to  by her husband.  He was unhappy about the care she received there.  During her stay, she somehow fell on her right knee and sustained a laceration for which sutures were require.  He stated that he took her to  a motel and had to get her "cleaned up".  Later on that day, she c/o chest pain so he had her treansferred here..  Pt is very confused and very tearful.  Earlier, she stated that she was too weak to feed herself.  Currently she states that she feels too weak to try to transfer.       Prior [place] expects to be discharged to:: Private residence Living Arrangements: Spouse/significant other Available Help at Discharge: Family;Available 24 hours/day Type of Home: Mobile home Home Access: Stairs to enter Entrance Stairs-Number of Steps: 3 Entrance Stairs-Rails: Right Home Layout: One level Home Equipment: Walker - 2 wheels;Bedside commode;Wheelchair - manual Prior Function Level of Independence: Needs assistance Gait / Transfers Assistance Needed: non ambulatory, max to total assist with ttransfers ADL's / Homemaking Assistance Needed: total assist Communication Communication: No difficulties    Cognition  Cognition Arousal/Alertness: Awake/alert Behavior During Therapy: WFL for tasks assessed/performed Overall Cognitive Status: Within Functional Limits for tasks assessed    Extremity/Trunk Assessment Lower Extremity Assessment Lower Extremity Assessment: Generalized  weakness RLE Deficits / Details: strength generally 2/5 LLE Deficits / Details: strength generally 2+/5   Balance    End of Session PT - End of Session Equipment Utilized During Treatment: Gait belt (sliding board) Activity Tolerance: Treatment limited secondary to agitation Patient left: in bed;with call bell/phone within reach;with nursing/sitter in room;with bed alarm set Nurse Communication: Mobility status  GP     Demetrios Isaacs L 09/27/2013, 2:16 PM

## 2013-09-27 NOTE — Progress Notes (Signed)
[facility] PROGRESS NOTE  Elizabeth Orr JSE:[id number] DOB: 02/26/1961 DOA: 09/25/2013 PCP: Dionisio Paschal, NP    Code Status: Full code Family Communication: Discussed with husband. Disposition Plan: Discharge to home when clinically appropriate; her husband was to take her home with home health PT.   Consultants:  Orthopedics, Dr. Luna Glasgow  Procedures:  09/26/13: 2-D echocardiogram:Study Conclusions - Left ventricle: The cavity size was normal. There was mild concentric hypertrophy. Systolic function was normal. The estimated ejection fraction was in the range of 60% to 65%. Wall motion was normal; there were no regional wall motion abnormalities. The pulmonary vein flow pattern was normal. Left ventricular diastolic function parameters were normal. - Aortic valve: Trileaflet; normal thickness leaflets. No regurgitation. - Aortic root: The aortic root was normal in size. - Mitral valve: Structurally normal valve. No regurgitation. - Left atrium: The atrium was at the upper limits of normal in size. - Tricuspid valve: No regurgitation. - Pulmonary arteries: Systolic pressure was within the normal range. - Inferior vena cava: The vessel was normal in size. - Pericardium, extracardiac: There was no pericardial effusion.    Antibiotics:  Rocephin 09/25/13>>  HPI/Subjective: The patient complains of generalized weakness. No complaints of chest pain, chest congestion, or left leg pain.  Objective: Filed Vitals:   09/27/13 0505  BP: 122/78  Pulse: 103  Temp: 98.4 F (36.9 C)  Resp: 18   Oxygen saturation 95% on room air   Intake/Output Summary (Last 24 hours) at 09/27/13 1048 Last data filed at 09/27/13 0500  Gross per 24 hour  Intake    710 ml  Output   1650 ml  Net   -940 ml   Filed Weights   09/26/13 0049 09/27/13 0300  Weight: 98.2 kg (216 lb 7.9 oz) 97.7 kg (215 lb 6.2 oz)    Exam:   General:  Pleasant overweight 53 year old Latina woman  sitting up in bed, in no acute distress. She looks slightly better today.  Cardiovascular: S1, S2, systolic murmur.  Respiratory: Decrease in bibasilar crackles, lungs are mostly clear. Breathing is nonlabored.  Abdomen: Positive bowel sounds, obese, mildly distended and query mild ascites, and mild generalized tenderness without guarding.  Musculoskeletal:  Left lower extremity with posterior splint in place. The patient is able to wiggle her toes. Right knee with sutures in place and mild ecchymosis. Mildly tender.  Neuropsychiatric: She has a flat affect. She is alert and oriented x3. Her speech is clear. Cranial nerves II through XII are grossly intact.   Data Reviewed: Basic Metabolic Panel:  Recent Labs Lab 09/25/13 1656 09/27/13 0540  NA 131* 137  K 3.8 2.9*  CL 96 100  CO2 20 26  GLUCOSE 97 90  BUN 4* 5*  CREATININE 0.40* 0.50  CALCIUM 7.8* 7.4*   Liver Function Tests:  Recent Labs Lab 09/25/13 1656 09/27/13 0540  AST 49* 42*  ALT 14 11  ALKPHOS 180* 156*  BILITOT 2.0* 1.3*  PROT 6.6 5.7*  ALBUMIN 1.3* 1.2*   No results found for this basename: LIPASE, AMYLASE,  in the last 168 hours No results found for this basename: AMMONIA,  in the last 168 hours CBC:  Recent Labs Lab 09/25/13 1656 09/26/13 1554  WBC 10.8* 11.1*  NEUTROABS 9.3* 7.7  HGB 9.5* 10.2*  HCT 30.4* 32.1*  MCV 119.7* 119.3*  PLT 132* 103*   Cardiac Enzymes:  Recent Labs Lab 09/25/13 1656  TROPONINI <0.30   BNP (last 3 results)  Recent Labs  09/25/13  [place] 1957.0*   CBG: No results found for this basename: GLUCAP,  in the last 168 hours  No results found for this or any previous visit (from the past 240 hour(s)).   Studies: Dg Knee 2 Views Right  09/25/2013   CLINICAL DATA:  Knee pain. Wound check, swelling and discharge from the incision site.  EXAM: RIGHT KNEE - 1-2 VIEW  COMPARISON:  01/26/2013  FINDINGS: Postoperative changes noted within the visualized mid to  distal right femur. Fracture line remains partially evident medially. Heterotopic bone or exuberant callus formation noted along the medial femoral metaphysis. Small joint effusion. No bony changes noted to suggest osteomyelitis.  IMPRESSION: Hardware in the distal femur. Fracture line remains at least partially evident medially with overlying heterotopic bone or exuberant callus formation.   Electronically Signed   By: Rolm Baptise M.D.   On: 09/25/2013 16:47   Dg Ankle 2 Views Left  09/25/2013   CLINICAL DATA:  Pain ; recent trauma  EXAM: LEFT ANKLE - 2 VIEW  COMPARISON:  September 16, 2003  FINDINGS: Frontal and lateral views were obtained. There is an overlying cast. There are skin staples medially and laterally. There is screw and plate fixation through a fracture of the distal fibula with alignment essentially anatomic. No new fracture or dislocation apparent. No erosive change or bony destruction. Joint spaces appear intact.  IMPRESSION: Postoperative change. No new fracture. Ankle mortise appears intact. No bony destruction.   Electronically Signed   By: Lowella Grip M.D.   On: 09/25/2013 16:40   Ct Angio Chest Pe W/cm &/or Wo Cm  09/25/2013   CLINICAL DATA:  53 year old female with shortness of breath, chest pain and weakness.  EXAM: CT ANGIOGRAPHY CHEST WITH CONTRAST  TECHNIQUE: Multidetector CT imaging of the chest was performed using the standard protocol during bolus administration of intravenous contrast. Multiplanar CT image reconstructions including MIPs were obtained to evaluate the vascular anatomy.  CONTRAST:  149mL OMNIPAQUE IOHEXOL 350 MG/ML SOLN  COMPARISON:  09/25/2012 and prior chest radiographs  FINDINGS: This is a technically satisfactory study.  No pulmonary emboli are identified.  There is no evidence of thoracic aortic aneurysm/dissection.  Cardiomegaly is present.  Small bilateral pleural effusions are identified. There is no evidence of pericardial effusion.  Ground-glass  opacities within both lungs identified - favor pulmonary edema over pneumonia.  Confluent opacities along the the anterior right upper and middle lobes are probably related to posttreatment/radiation changes.  Mild bibasilar atelectasis is present.  Diffuse subcutaneous edema is noted.  Patient is status post right mastectomy.  No enlarged lymph nodes are identified.  Diffuse fatty infiltration of the liver is present.  A small to moderate hiatal hernia is identified.  No acute bony abnormalities are noted.  Review of the MIP images confirms the above findings.  IMPRESSION: No evidence of pulmonary emboli or thoracic aortic dissection.  Bilateral ground-glass opacities -favor edema over infection.  Cardiomegaly with small bilateral pleural effusions, diffuse subcutaneous edema and mild bibasilar atelectasis.  Right mastectomy and likely posttreatment/radiation changes within the anterior right lung.  Diffuse fatty infiltration of the liver and small to moderate hiatal hernia.   Electronically Signed   By: Hassan Rowan M.D.   On: 09/25/2013 21:38   Dg Chest Portable 1 View  09/25/2013   CLINICAL DATA:  Shortness of breath and fever  EXAM: PORTABLE CHEST - 1 VIEW  COMPARISON:  January 21, 2013  FINDINGS: Heart is prominent with interstitial edema. There  is no airspace consolidation. The pulmonary vascularity is normal.  Port-A-Cath tip is in the superior cava. No pneumothorax. There are surgical clips in the right axillary region.  IMPRESSION: Heart is prominent with interstitial edema. Suspect a degree of congestive heart failure. There is no apparent airspace consolidation.   Electronically Signed   By: Lowella Grip M.D.   On: 09/25/2013 15:58    Scheduled Meds: . albuterol  2.5 mg Nebulization QID  . aspirin EC  325 mg Oral Daily  . benzonatate  100 mg Oral TID  . cefTRIAXone (ROCEPHIN)  IV  1 g Intravenous Q24H  . docusate sodium  100 mg Oral BID  . furosemide  30 mg Intravenous BID  . gabapentin  900 mg  Oral BID  . iron polysaccharides  150 mg Oral Daily  . pantoprazole  40 mg Oral Daily  . polyethylene glycol  17 g Oral Daily  . potassium chloride  20 mEq Oral BID  . sucralfate  1 g Oral QID  . tamoxifen  20 mg Oral Daily   Continuous Infusions:   Assessment:  Principal Problem:   Chest pain Active Problems:   Pulmonary edema, postoperative   Anasarca   UTI (lower urinary tract infection)   Hepatic steatosis   Bimalleolar ankle fracture   Polysubstance abuse   Transaminitis   Hypoalbuminemia   Anemia, iron deficiency   Macrocytosis   Thrombocytopenia, unspecified   Hyponatremia   Alcoholism   Hypokalemia   1. Chest pain. Resolved. Her EKG revealed no ST or T wave abnormalities. Her troponin I was negative. Her 2-D echocardiogram revealed ejection fraction of 123456, normal diastolic function, in no wall motion abnormalities. CT angiogram of her chest revealed no evidence of PE. Her chest pain appeared to have been atypical. CT angiogram of the chest was negative for PE. Continue PPI and supportive treatment.  Acute pulmonary edema with anasarca. CT angiogram of the chest revealed pulmonary edema, small bilateral pleural effusions, and diffuse subcutaneous edema. 2-D echocardiogram results noted for no systolic dysfunction and no diastolic dysfunction. Anasarca may be secondary to volume resuscitation during the previous hospitalization for her right ankle surgery and hypoalbuminemia. She has diuresed 1.3 L. We'll continue gentle diuresis with Lasix. We'll transition Lasix to by mouth and add Aldactone. Continue albuterol nebulizer and Tussionex ordered for symptomatic treatment.  Hyponatremia, likely secondary to volume overload. Resolved with diuresis.  Hypokalemia. Likely secondary to Lasix. We'll supplement and check her magnesium level.  Hyperbilirubinemia, hypoalbuminemia and mild transaminitis.   All probably secondary to volume overload and fatty liver disease. Her  husband disclosed that she is a recovering alcoholic, so alcoholic hepatitis is also a possibility. We'll order an abdominal ultrasound for evaluation. Viral hepatitis panel and other hepatic studies are pending.  Macrocytic anemia.  This may be secondary to alcoholism or from her previous history of chemotherapy for breast cancer. Her vitamin B12 level is within normal limits at 472 and her TSH is within normal limits at 0.5. She is followed by oncology for anemia and history of breast cancer. Her ferritin was 507, total iron 96, and TIBC was 123 on 07/06/13. We'll continue iron therapy and add thiamine and a multivitamin.  Thrombocytopenia. Her platelet count has decreased from 178 during previous the hospitalization last week. Per chart review, she has not had a recent history of thrombocytopenia. She was discharged on Lovenox and aspirin on 09/20/13. Will hold Lovenox and give DVT prophylaxis with SCDs. As above, her TSH and  vitamin B12 level are normal.  UTI. Urine culture pending. Continue Rocephin.  Recent left ankle fracture, status post surgery. She has a splint in place. Dr. Luna Glasgow has seen the patient and plans to change the splint to a short leg cast with removal of sutures in the next day or 2.  Status post fall at the outside nursing facility, resulting in laceration of right patella area. Currently stable with sutures in place.  History of peptic ulcer disease. We'll continue Carafate and Protonix.  Alcoholism/alcohol abuse, currently in self-induced recovery. The patient has a long-standing history of alcohol abuse drinking 4-40 ounce beers daily up until recently per her husband. Her husband stated that she had not drunk any beer at the nursing home and has not drunk alcohol in more than 10 days. She denied any history of alcohol withdrawal syndrome. Will add thiamine and multivitamin empirically.  Disposition: Per discussion with the social worker and physical therapist, and the  patient's husband, the plan is to discharge her to home when clinically appropriate with home health PT.   Plan: 1. As above. 2. Change Lasix to by mouth and add Aldactone. Supplement KCL and check 3. Continue to monitor her blood counts. 4. Order ultrasound of the abdomen to assess for gallbladder disease/liver disease and followup on the results of the viral hepatitis panel, ANA, antimitochondrial antibodies, ceruloplasmin. 5. Check HIT panel    Time spent: 40 minutes.    [facility] Pager [phone]. If 7PM-7AM, please contact night-coverage at [email or web address], password [email or web address] 09/27/2013, 10:48 AM  LOS: 2 days

## 2013-09-27 NOTE — Care Management Note (Signed)
    Page 1 of 2   09/28/2013     1:08:00 PM   CARE MANAGEMENT NOTE 09/28/2013  Patient:  TAIYLOR, VIRDEN   Account Number:  [id number]  Date Initiated:  09/27/2013  Documentation initiated by:  Claretha Cooper  Subjective/Objective Assessment:   Pt asleep, spoke to spouse. He plans to take her home. Will set up [facility] and DME.     Action/Plan:   Anticipated DC Date:     Anticipated DC Plan:  [place]  In-house referral  Clinical Social Worker      DC Forensic scientist  CM consult      PAC Choice  [place]   Choice offered to / List presented to:  C-3 Spouse   DME arranged  [place]      DME agency  [place] arranged  HH-1 RN  [place].   Status of service:  Completed, signed off Medicare Important Message given?   (If response is "NO", the following Medicare IM given date fields will be blank) Date Medicare IM given:   Date Additional Medicare IM given:    Discharge Disposition:  [place]  Per UR Regulation:    If discussed at Long Length of Stay Meetings, dates discussed:    Comments:  09/28/13 Claretha Cooper RN BSN CM Set pt up at the [facility] clinic for next week, the 18th at 2pm. Spouse asked regarding a nebulizer machine, MD notified, MD stated it was not necessary due to inhalers.  09/27/13 Claretha Cooper RN BSN CM

## 2013-09-27 NOTE — Progress Notes (Signed)
UR completed. Patient changed to inpatient- requiring IV antibiotics and IV lasix BID

## 2013-09-27 NOTE — Progress Notes (Signed)
CRITICAL VALUE ALERT  Critical value received: potassium 2.9  Date of notification: 09-26-13  Time of notification:  0630  Critical value read back: yes   Nurse who received alert:  b Milana Obey   MD notified (1st page):  Jenne Pane Time of first page: 0630  MD notified (2nd page):  Time of second page:  Responding MD:  Jenne Pane Time MD responded: [phone]   Dr. Shanon Brow responded to page about potassium, received order for potassium 40 meq po.

## 2013-09-27 NOTE — Clinical Social Work Note (Signed)
CSW met with pt and husband briefly this morning. Husband went to visit pt on Friday at [facility] where pt d/c to last week from hospital. He was not pleased with care at facility and took her out of SNF on Friday. They stayed at a hotel that night and on Saturday pt began to complain of chest pain. CSW discussed d/c plan from hospital this time and husband states he will take pt home and be caregiver as he does not feel anyone else can do it as he would. He reports he purchased some equipment over weekend for pt to use. Pt was confused during visit. He is open to home health if qualifies. CM notified. Pt's husband admitted to MD today that pt drinks 4 40 ounce beers daily. He now says it was between 1-4 a day. He admits that he did supply this for her some, but never will again. Husband indicates pt's family has extensive history of alcoholism, however he does not feel that her drinking was "heavy." CSW provided information on [employer] meetings for him to attend as pt is not able to attend at this point. He admits that he does not read or write and CSW verbalized meeting in [place] on Tuesday evenings. Husband appreciative and said he will consider. He is currently considering a new rental if his current landlord cannot make all necessary changes by tomorrow. Asked about assistance for utilities. CSW referred to DSS and [employer]. CSW will sign off, but can be reconsulted if needed.  Benay Pike, [place]

## 2013-09-28 ENCOUNTER — Inpatient Hospital Stay (HOSPITAL_COMMUNITY): Payer: Medicaid Other

## 2013-09-28 ENCOUNTER — Encounter (HOSPITAL_COMMUNITY): Payer: Self-pay | Admitting: Internal Medicine

## 2013-09-28 DIAGNOSIS — K7030 Alcoholic cirrhosis of liver without ascites: Secondary | ICD-10-CM

## 2013-09-28 HISTORY — DX: Alcoholic cirrhosis of liver without ascites: K70.30

## 2013-09-28 LAB — CBC
HCT: 27.9 % — ABNORMAL LOW (ref 36.0–46.0)
HCT: 27.5 % — ABNORMAL LOW (ref 36.0–46.0)
Hemoglobin: 8.9 g/dL — ABNORMAL LOW (ref 12.0–15.0)
Hemoglobin: 8.8 g/dL — ABNORMAL LOW (ref 12.0–15.0)
MCH: 38.7 pg — AB (ref 26.0–34.0)
MCH: 38.6 pg — ABNORMAL HIGH (ref 26.0–34.0)
MCHC: 31.9 g/dL (ref 30.0–36.0)
MCHC: 32 g/dL (ref 30.0–36.0)
MCV: 121.3 fL — AB (ref 78.0–100.0)
MCV: 120.6 fL — ABNORMAL HIGH (ref 78.0–100.0)
PLATELETS: 150 10*3/uL (ref 150–400)
PLATELETS: 144 10*3/uL — AB (ref 150–400)
RBC: 2.3 MIL/uL — ABNORMAL LOW (ref 3.87–5.11)
RBC: 2.28 MIL/uL — ABNORMAL LOW (ref 3.87–5.11)
RDW: 18.1 % — AB (ref 11.5–15.5)
RDW: 17.9 % — AB (ref 11.5–15.5)
WBC: 6.9 10*3/uL (ref 4.0–10.5)
WBC: 6.3 10*3/uL (ref 4.0–10.5)

## 2013-09-28 LAB — BASIC METABOLIC PANEL
BUN: 4 mg/dL — ABNORMAL LOW (ref 6–23)
CALCIUM: 7.7 mg/dL — AB (ref 8.4–10.5)
CO2: 29 meq/L (ref 19–32)
Chloride: 98 mEq/L (ref 96–112)
Creatinine, Ser: 0.44 mg/dL — ABNORMAL LOW (ref 0.50–1.10)
GFR calc Af Amer: >90 mL/min (ref >90)
GFR calc non Af Amer: >90 mL/min (ref >90)
GLUCOSE: 80 mg/dL (ref 70–99)
Potassium: 3.7 mEq/L (ref 3.7–5.3)
Sodium: 135 mEq/L — ABNORMAL LOW (ref 137–147)

## 2013-09-28 LAB — AMMONIA: Ammonia: 38 umol/L (ref 11–60)

## 2013-09-28 LAB — ANA: ANA: NEGATIVE

## 2013-09-28 LAB — MITOCHONDRIAL ANTIBODIES: MITOCHONDRIAL M2 AB, IGG: 0.38 (ref <0.91)

## 2013-09-28 MED ORDER — TAB-A-VITE/IRON PO TABS: 1.0000 | ORAL_TABLET | Freq: Every day | ORAL | Status: AC

## 2013-09-28 MED ORDER — GABAPENTIN 300 MG PO CAPS: 900.0000 mg | ORAL_CAPSULE | Freq: Two times a day (BID) | ORAL | Status: AC

## 2013-09-28 MED ORDER — FUROSEMIDE 20 MG PO TABS: 20.0000 mg | ORAL_TABLET | Freq: Every day | ORAL | Status: DC

## 2013-09-28 MED ORDER — SUCRALFATE 1 GM/10ML PO SUSP: 1.0000 g | Freq: Two times a day (BID) | ORAL | Status: DC

## 2013-09-28 MED ORDER — HEPARIN SOD (PORK) LOCK FLUSH 100 UNIT/ML IV SOLN
500.0000 [IU] | INTRAVENOUS | Status: AC | PRN
  Administered 2013-09-28: 500 [IU]
  Filled 2013-09-28: qty 5

## 2013-09-28 MED ORDER — OMEPRAZOLE 20 MG PO CPDR: 20.0000 mg | DELAYED_RELEASE_CAPSULE | Freq: Every day | ORAL | Status: DC

## 2013-09-28 MED ORDER — POTASSIUM CHLORIDE CRYS ER 20 MEQ PO TBCR: 20.0000 meq | EXTENDED_RELEASE_TABLET | Freq: Every day | ORAL | Status: DC

## 2013-09-28 MED ORDER — POTASSIUM CHLORIDE CRYS ER 20 MEQ PO TBCR: 30.0000 meq | EXTENDED_RELEASE_TABLET | Freq: Every day | ORAL | Status: DC

## 2013-09-28 MED ORDER — POTASSIUM CHLORIDE CRYS ER 20 MEQ PO TBCR
30.0000 meq | EXTENDED_RELEASE_TABLET | Freq: Every day | ORAL | Status: AC
  Administered 2013-09-28: 30 meq via ORAL
  Filled 2013-09-28 (×2): qty 1

## 2013-09-28 MED ORDER — BENZONATATE 100 MG PO CAPS: 100.0000 mg | ORAL_CAPSULE | Freq: Three times a day (TID) | ORAL | Status: DC

## 2013-09-28 MED ORDER — OXYCODONE-ACETAMINOPHEN 5-325 MG PO TABS: 1.0000 | ORAL_TABLET | ORAL | Status: DC | PRN

## 2013-09-28 MED ORDER — FUROSEMIDE 10 MG/ML IJ SOLN
20.0000 mg | Freq: Once | INTRAMUSCULAR | Status: AC
  Administered 2013-09-28: 20 mg via INTRAVENOUS
  Filled 2013-09-28: qty 2

## 2013-09-28 MED ORDER — FUROSEMIDE 40 MG PO TABS: 40.0000 mg | ORAL_TABLET | Freq: Every day | ORAL | Status: AC

## 2013-09-28 MED ORDER — SPIRONOLACTONE 12.5 MG HALF TABLET: 12.5000 mg | ORAL_TABLET | Freq: Every day | ORAL | Status: DC

## 2013-09-28 MED ORDER — THIAMINE HCL 100 MG PO TABS: 100.0000 mg | ORAL_TABLET | Freq: Every day | ORAL | Status: AC

## 2013-09-28 MED ORDER — SPIRONOLACTONE 25 MG PO TABS: 25.0000 mg | ORAL_TABLET | Freq: Every day | ORAL | Status: AC

## 2013-09-28 MED ORDER — CEFUROXIME AXETIL 500 MG PO TABS: 500.0000 mg | ORAL_TABLET | Freq: Two times a day (BID) | ORAL | Status: DC

## 2013-09-28 MED ORDER — ENOXAPARIN SODIUM 40 MG/0.4ML ~~LOC~~ SOLN: 40.0000 mg | SUBCUTANEOUS | Status: DC

## 2013-09-28 MED ORDER — POLYSACCHARIDE IRON COMPLEX 150 MG PO CAPS: 150.0000 mg | ORAL_CAPSULE | Freq: Every day | ORAL | Status: AC

## 2013-09-28 MED ORDER — ASPIRIN 81 MG PO TBEC: 81.0000 mg | DELAYED_RELEASE_TABLET | Freq: Every day | ORAL | Status: DC

## 2013-09-28 MED ORDER — ALBUTEROL SULFATE HFA 108 (90 BASE) MCG/ACT IN AERS: 2.0000 | INHALATION_SPRAY | Freq: Four times a day (QID) | RESPIRATORY_TRACT | Status: AC | PRN

## 2013-09-28 NOTE — Progress Notes (Signed)
Subjective: My ankle feels better   Objective: Vital signs in last 24 hours: Temp:  [97.7 F (36.5 C)-98.4 F (36.9 C)] 97.7 F (36.5 C) (02/10 0450) Pulse Rate:  [95-108] 95 (02/10 0450) Resp:  [18] 18 (02/10 0450) BP: (117-137)/(74-84) 137/84 mmHg (02/10 0450) SpO2:  [93 %-98 %] 93 % (02/10 0737) FiO2 (%):  [21 %] 21 % (02/10 0737) Weight:  [101 kg (222 lb 10.6 oz)] 101 kg (222 lb 10.6 oz) (02/10 0450)  Intake/Output from previous day: 02/09 0701 - 02/10 0700 In: -  Out: 1050 [Urine:1050] Intake/Output this shift:     Recent Labs  09/25/13 1656 09/26/13 1554 09/28/13 0459 09/28/13 0853  HGB 9.5* 10.2* 8.9* 8.8*    Recent Labs  09/28/13 0459 09/28/13 0853  WBC 6.9 6.3  RBC 2.30* 2.28*  HCT 27.9* 27.5*  PLT 150 144*    Recent Labs  09/27/13 0540 09/28/13 0459  NA 137 135*  K 2.9* 3.7  CL 100 98  CO2 26 29  BUN 5* 4*  CREATININE 0.50 0.44*  GLUCOSE 90 80  CALCIUM 7.4* 7.7*    Recent Labs  09/25/13 1656  INR 1.43    Neurologically intact Neurovascular intact Sensation intact distally Intact pulses distally Dorsiflexion/Plantar flexion intact Incision: no drainage No cellulitis present Compartment soft  Her splint was removed.  The wounds of the left ankle look good.  Staples were removed.  I applied sterile dressing and applied a new short leg fiberglass cast, tolerated well.    She needs to have toe touch only to the left foot.  The cast will stay in place for one month.  I will see her in my office in one month for x-rays out of the cast.  Progress in PT is poor to limited.  Assessment/Plan: Healing bimalleolar fracture of the left ankle.  New cast applied.   Elizabeth Orr 09/28/2013, 10:29 AM

## 2013-09-28 NOTE — Discharge Summary (Signed)
Physician Discharge Summary  CLAUDETT OSTERLUND [id number] DOB: 13-Dec-1960 DOA: 09/25/2013  PCP: No primary provider on file. [facility], pending  Admit date: 09/25/2013 Discharge date: 09/28/2013  Time spent: Greater than 30 minutes  Recommendations for Outpatient Follow-up:  1. The patient will need to have her CBC, specifically her platelet count rechecked at her followup appointment. She will also need to have her electrolytes and renal functions rechecked. 2. Home health physical therapy and registered nurse were ordered at the time of discharge. Home health equipment including hospital bed, trapezius, hoyer lift, etc were ordered.  3. HIT panel pending at the time of discharge.  Discharge Diagnoses:  1. Chest pain, myocardial infarction ruled out. 2. Pulmonary edema and anasarca, likely secondary to previous IV fluids during and after recent left ankle operation and hypoalbuminemia. 3. Sonographic findings of hepatic cirrhosis/steatosis, likely alcohol induced. Viral hepatitis panel was negative. ----Bilirubin anemia and mild hepatic transaminitis. 4. Alcohol abuse/alcoholism. 5. Hypoalbuminemia, secondary to severe protein calorie malnutrition and alcoholic liver disease. 6. Urinary tract infection. 7. Macrocytosis, likely secondary to alcohol abuse. Vitamin B12 and TSH were within normal limits. 8. Known iron deficiency anemia, followed by hematology. 9. Thrombocytopenia, rule out HIT. 10. History of recent left ankle fracture, status post surgery, but Dr. Luna Glasgow on 09/16/13. 11. Status post fall at the skilled nursing facility causing laceration of right knee. 12. Hypokalemia, repleted. 13. Obesity. 14. Occasional confusion, query mild alcoholic dementia. 15. History of breast cancer.   Discharge Condition: Stable and improved.  Diet recommendation: Heart healthy.  Filed Weights   09/26/13 0049 09/27/13 0300 09/28/13 0450  Weight: 98.2 kg (216 lb 7.9 oz) 97.7 kg (215  lb 6.2 oz) 101 kg (222 lb 10.6 oz)    History of present illness:  53 yo female h/o obesity, polysubstance/ETOH abuse, breast cancer, gerd with recent surgery for repair of an left ankle fracture was d/c'd by ortho this past Monday to a rehab center in [place], [place]. Husband went to rehab to see her and found out she had fallen during physical therapy and suffered a laceration to her right knee which needed sutures. He became angry because he was not notified of this, so he signed the patient out [place] and brought her to [facility] [place] ED. This history was obtained from ed staff. Husband left to tend to grandchildren. Upon further questioning pt revealed she had been having some sscp that was brief for several days. She said she had some mild swelling to her legs since the surgery. She has been more sob when lying down and coughs when lying down. No fevers. She had been doing ok with rehab. No dysuria. No h/o chf.    Hospital Course:    Chest pain.  She was started on supportive treatment with analgesics as needed. PPI was continued. Her chest x-ray revealed pulmonary edema for which she was started on IV Lasix. Her EKG revealed no ST or T wave abnormalities. Her troponin I was negative. Her 2-D echocardiogram revealed ejection fraction of [id number], normal diastolic function, and no wall motion abnormalities. CT angiogram of her chest revealed no evidence of PE. Her chest pain appeared to have been atypical. Her chest pain resolved.  Acute pulmonary edema with anasarca.  CT angiogram of the chest revealed pulmonary edema, small bilateral pleural effusions, and diffuse subcutaneous edema. 2-D echocardiogram results noted for no systolic dysfunction and no diastolic dysfunction. Anasarca may have been secondary to volume resuscitation during the previous hospitalization for her  right ankle surgery and hypoalbuminemia. She has diuresed 2.5 L. Ultrasound of her abdomen revealed cirrhosis and steatosis which could be  the etiology of her hypoalbuminemia. Aldactone was started. She was treated with as needed albuterol nebulizer and Tussionex for cough. Her anasarca subsided, but it did not completely resolve.   Hyponatremia, likely secondary to volume overload.  Resolved with diuresis.  Hypokalemia.  Likely secondary to Lasix. Resolved with supplementation. Alcoholic liver disease with Hyperbilirubinemia, hypoalbuminemia and mild transaminitis.  CT scan of her abdomen revealed fatty liver disease. Ultrasound of her abdomen revealed an abnormal appearance, likely due to a combination of cirrhosis and steatosis, but no discrete liver mass and a normal gallbladder. Additional studies revealed a negative viral hepatitis panel, negative antimitochondrial antibodies and negative ANA. Her husband disclosed that the patient had been an alcoholic for nearly 30 years before stopping completely at the skilled nursing facility. She was advised to stop drinking indefinitely. Her husband will forbid alcohol in the home from now on. She had no evidence of alcohol withdrawal syndrome. However, at times, she became confused. CT scan of her head was negative and her ammonia level was within normal limits. Thrombocytopenia.  Her platelet count has decreased from 216 to 178 to 132, including values from the previous hospitalization last week. Per chart review, she has not had a history of thrombocytopenia in the past. Her platelet count decreased to 103, but this was thought to be a mistake. It was repeated at 144. There was a concern about HIT, therefore, Lovenox was discontinued. She was continued on aspirin. At the time of discharge, HIT panel results are pending. I instructed her husband that I would call concerning the results and if they were negative, she would need to restart Lovenox for 3 more weeks. her TSH and vitamin B12 level were normal.  UTI.  She was started on treatment with Rocephin. The urine culture grew out greater  than 100,000 colonies of gram-negative rods. She was discharged on 5 more days of Ceftin.  Recent left ankle fracture, status post surgery.  Dr. Luna Glasgow was consulted and took off the splint and placed a short leg fiberglass cast. He will see her in his office in one month.  Status post fall at the outside nursing facility, resulting in laceration of right patella area. Currently stable with sutures in place.  History of peptic ulcer disease. Carafate and PPI were continued.  Alcoholism/alcohol abuse, currently in self-induced recovery. The patient has a long-standing history of alcohol abuse drinking 4-40 ounce beers daily up until recently per her husband. Her husband stated that she had not drunk any beer at the nursing home and has not drunk alcohol in more than 10 days. She denied any history of alcohol withdrawal syndrome. She was given thiamine and a multivitamin.   Disposition:  The patient's husband wanted to take her home despite the limitations. He did not want her in another nursing facility. Therefore, home health registered nurse and physical therapy along with equipment were ordered. She was given an appointment for a new primary care provider at the time of discharge.     Procedures: 09/26/13: 2-D echocardiogram:Study Conclusions - Left ventricle: The cavity size was normal. There was mild concentric hypertrophy. Systolic function was normal. The estimated ejection fraction was in the range of 60% to 65%. Wall motion was normal; there were no regional wall motion abnormalities. The pulmonary vein flow pattern was normal. Left ventricular diastolic function parameters were normal. - Aortic valve: Trileaflet;  normal thickness leaflets. No regurgitation. - Aortic root: The aortic root was normal in size. - Mitral valve: Structurally normal valve. No regurgitation. - Left atrium: The atrium was at the upper limits of normal in size. - Tricuspid valve: No regurgitation. -  Pulmonary arteries: Systolic pressure was within the normal range. - Inferior vena cava: The vessel was normal in size. - Pericardium, extracardiac: There was no pericardial effusion.   Consultations:  Orthopedics, Dr. Luna Glasgow  Discharge Exam: Filed Vitals:   09/28/13 1531  BP: 126/76  Pulse: 77  Temp: 98.2 F (36.8 C)  Resp: 20    General: Pleasant overweight 53 year old Latina woman sitting up in bed, in no acute distress. She looks slightly better today.  Cardiovascular: S1, S2, systolic murmur.  Respiratory: Scattered bibasilar crackles. Breathing is nonlabored.  Abdomen: Positive bowel sounds, obese, mildly distended and query mild ascites, and mild generalized tenderness without guarding.  Musculoskeletal: 1+ to 2+ bilateral upper extremity edema and 1+ bilateral lower extremity edema Left lower extremity with posterior splint in place. The patient is able to wiggle her toes. Right knee with sutures in place and mild ecchymosis. Mildly tender.  Neuropsychiatric: She has a flat affect. She is alert and oriented to the hospital, herself, and her husband. Cranial nerves II through XII are grossly intact.   Discharge Instructions      Discharge Orders   Future Orders Complete By Expires   Diet general  As directed    Discharge instructions  As directed    Comments:     DO NOT DRINK ALCOHOL. TAKE MEDICATIONS AS PRESCRIBED. DR. Caryn Section WILL CALL YOU REGARDING THE BLOOD THINNER INJECTION.   Increase activity slowly  As directed        Medication List         albuterol 108 (90 BASE) MCG/ACT inhaler  Commonly known as:  PROVENTIL HFA;VENTOLIN HFA  Inhale 2 puffs into the lungs every 6 (six) hours as needed for wheezing.     aspirin 81 MG EC tablet  Take 1 tablet (81 mg total) by mouth daily.     benzonatate 100 MG capsule  Commonly known as:  TESSALON  Take 1 capsule (100 mg total) by mouth 3 (three) times daily.     cefUROXime 500 MG tablet  Commonly known as:   CEFTIN  Take 1 tablet (500 mg total) by mouth 2 (two) times daily with a meal. Antibiotic to be taken for 5 more days, starting tomorrow.     docusate sodium 100 MG capsule  Commonly known as:  COLACE  Take 100 mg by mouth 2 (two) times daily.     enoxaparin 40 MG/0.4ML injection  Commonly known as:  LOVENOX  Inject 0.4 mLs (40 mg total) into the skin daily. Not use this medication until you are notified by Dr. Caryn Section.     furosemide 40 MG tablet  Commonly known as:  LASIX  Take 1 tablet (40 mg total) by mouth daily. Take for swelling.     gabapentin 300 MG capsule  Commonly known as:  NEURONTIN  Take 3 capsules (900 mg total) by mouth 2 (two) times daily.     iron polysaccharides 150 MG capsule  Commonly known as:  NIFEREX  Take 1 capsule (150 mg total) by mouth daily.     magnesium hydroxide 400 MG/5ML suspension  Commonly known as:  MILK OF MAGNESIA  Take 30 mLs by mouth daily as needed for constipation.     multivitamins with iron  Tabs tablet  Take 1 tablet by mouth daily.     omeprazole 20 MG capsule  Commonly known as:  PRILOSEC  Take 1 capsule (20 mg total) by mouth daily.     oxyCODONE-acetaminophen 5-325 MG per tablet  Commonly known as:  PERCOCET/ROXICET  Take 1 tablet by mouth every 4 (four) hours as needed for moderate pain or severe pain.     polyethylene glycol packet  Commonly known as:  MIRALAX / GLYCOLAX  Take 17 g by mouth daily.     potassium chloride SA 20 MEQ tablet  Commonly known as:  K-DUR,KLOR-CON  Take 1 tablet (20 mEq total) by mouth daily.     spironolactone 25 MG tablet  Commonly known as:  ALDACTONE  Take 1 tablet (25 mg total) by mouth daily. For generalized swelling.     sucralfate 1 GM/10ML suspension  Commonly known as:  CARAFATE  Take 10 mLs (1 g total) by mouth 2 (two) times daily.     tamoxifen 20 MG tablet  Commonly known as:  NOLVADEX  Take 1 tablet (20 mg total) by mouth daily.     thiamine 100 MG tablet  Take 1 tablet  (100 mg total) by mouth daily.       Allergies  Allergen Reactions  . Citalopram     Makes her feel drowsy/sleepy.  . Effexor [Venlafaxine Hcl]     Makes pt feel very bad,will not take again.  . Motrin [Ibuprofen] Swelling    Lips swell  . Trazodone And Nefazodone     Makes pt feel very bad and "hung over"   Follow-up Information   Follow up with [facility] On 10/06/2013. (@ 2pm)    Contact information:   [address] [place]  [phone]      Follow up with [facility] (RN and PT)    Contact information:   [address] [place] [address] [phone]      Follow up with Sanjuana Kava, MD. Schedule an appointment as soon as possible for a visit in 1 month.   Specialty:  Orthopedic Surgery   Contact information:   [place] [address] [phone]        The results of significant diagnostics from this hospitalization (including imaging, microbiology, ancillary and laboratory) are listed below for reference.    Significant Diagnostic Studies: Dg Knee 2 Views Right  09/25/2013   CLINICAL DATA:  Knee pain. Wound check, swelling and discharge from the incision site.  EXAM: RIGHT KNEE - 1-2 VIEW  COMPARISON:  01/26/2013  FINDINGS: Postoperative changes noted within the visualized mid to distal right femur. Fracture line remains partially evident medially. Heterotopic bone or exuberant callus formation noted along the medial femoral metaphysis. Small joint effusion. No bony changes noted to suggest osteomyelitis.  IMPRESSION: Hardware in the distal femur. Fracture line remains at least partially evident medially with overlying heterotopic bone or exuberant callus formation.   Electronically Signed   By: Rolm Baptise M.D.   On: 09/25/2013 16:47   Dg Ankle 2 Views Left  09/25/2013   CLINICAL DATA:  Pain ; recent trauma  EXAM: LEFT ANKLE - 2 VIEW  COMPARISON:  September 16, 2003  FINDINGS: Frontal and lateral views were obtained.  There is an overlying cast. There are skin staples medially and laterally. There is screw and plate fixation through a fracture of the distal fibula with alignment essentially anatomic. No new fracture  or dislocation apparent. No erosive change or bony destruction. Joint spaces appear intact.  IMPRESSION: Postoperative change. No new fracture. Ankle mortise appears intact. No bony destruction.   Electronically Signed   By: Lowella Grip M.D.   On: 09/25/2013 16:40   Dg Ankle 2 Views Left  09/16/2013   CLINICAL DATA:  Ankle fracture  EXAM: LEFT ANKLE - 2 VIEW; DG C-ARM 1-60 MIN  COMPARISON:  09/15/2013  FINDINGS: Single AP view of the left ankle with the C-arm. Plate and screw fixation of distal fibular fracture. Ankle mortise intact on the AP view.  IMPRESSION: Plate fixation of distal fibular fracture.   Electronically Signed   By: Franchot Gallo M.D.   On: 09/16/2013 15:56   Dg Ankle Complete Left  09/15/2013   CLINICAL DATA:  Fall.  Pain and swelling.  EXAM: LEFT ANKLE COMPLETE - 3+ VIEW  COMPARISON:  None  FINDINGS: There is an oblique fracture of the distal fibula. There is an avulsion fracture the medial malleolus. There is pronounced widening of the ankle mortise. Posterior lip of the tibia appears intact.  IMPRESSION: Bimalleolar fracture with widening of the ankle mortise.   Electronically Signed   By: Nelson Chimes M.D.   On: 09/15/2013 11:37   Ct Head Wo Contrast  09/28/2013   CLINICAL DATA:  Confusion. History of headaches. History of breast cancer, right mastectomy in 2013.  EXAM: CT HEAD WITHOUT CONTRAST  TECHNIQUE: Contiguous axial images were obtained from the base of the skull through the vertex without intravenous contrast.  COMPARISON:  09/15/2013  FINDINGS: There is no intra or extra-axial fluid collection or mass lesion. The basilar cisterns and ventricles have a normal appearance. There is no CT evidence for acute infarction or hemorrhage. Bone windows are unremarkable.  IMPRESSION:  No evidence for acute intracranial abnormality.   Electronically Signed   By: Shon Hale M.D.   On: 09/28/2013 15:36   Ct Head Wo Contrast  09/15/2013   CLINICAL DATA:  53 year old female status post fall with head injury. Initial encounter. History of 2013 breast cancer.  EXAM: CT HEAD WITHOUT CONTRAST  TECHNIQUE: Contiguous axial images were obtained from the base of the skull through the vertex without intravenous contrast.  COMPARISON:  Brain MRI 07/30/2013.  FINDINGS: Visualized paranasal sinuses and mastoids are clear. Dysconjugate gaze, otherwise negative visualized orbits soft tissues. No scalp hematoma identified. No acute or suspicious osseous lesion identified.  Stable cerebral volume. Mild patchy white matter hypodensity appears stable. No midline shift, mass effect, or evidence of intracranial mass lesion. No evidence of cortically based acute infarction identified. No suspicious intracranial vascular hyperdensity. No acute intracranial hemorrhage identified.  IMPRESSION: 1. No acute intracranial abnormality. No acute traumatic injury identified. 2. Stable mild nonspecific white matter changes.   Electronically Signed   By: Lars Pinks M.D.   On: 09/15/2013 13:11   Ct Angio Chest Pe W/cm &/or Wo Cm  09/25/2013   CLINICAL DATA:  53 year old female with shortness of breath, chest pain and weakness.  EXAM: CT ANGIOGRAPHY CHEST WITH CONTRAST  TECHNIQUE: Multidetector CT imaging of the chest was performed using the standard protocol during bolus administration of intravenous contrast. Multiplanar CT image reconstructions including MIPs were obtained to evaluate the vascular anatomy.  CONTRAST:  161mL OMNIPAQUE IOHEXOL 350 MG/ML SOLN  COMPARISON:  09/25/2012 and prior chest radiographs  FINDINGS: This is a technically satisfactory study.  No pulmonary emboli are identified.  There is no evidence of thoracic aortic aneurysm/dissection.  Cardiomegaly is present.  Small bilateral pleural effusions are  identified. There is no evidence of pericardial effusion.  Ground-glass opacities within both lungs identified - favor pulmonary edema over pneumonia.  Confluent opacities along the the anterior right upper and middle lobes are probably related to posttreatment/radiation changes.  Mild bibasilar atelectasis is present.  Diffuse subcutaneous edema is noted.  Patient is status post right mastectomy.  No enlarged lymph nodes are identified.  Diffuse fatty infiltration of the liver is present.  A small to moderate hiatal hernia is identified.  No acute bony abnormalities are noted.  Review of the MIP images confirms the above findings.  IMPRESSION: No evidence of pulmonary emboli or thoracic aortic dissection.  Bilateral ground-glass opacities -favor edema over infection.  Cardiomegaly with small bilateral pleural effusions, diffuse subcutaneous edema and mild bibasilar atelectasis.  Right mastectomy and likely posttreatment/radiation changes within the anterior right lung.  Diffuse fatty infiltration of the liver and small to moderate hiatal hernia.   Electronically Signed   By: Hassan Rowan M.D.   On: 09/25/2013 21:38   US Abdomen Complete  09/28/2013   CLINICAL DATA:  Cirrhosis.  Low albumin.  EXAM: ULTRASOUND ABDOMEN COMPLETE  COMPARISON:  CT, 06/30/2012  FINDINGS: Gallbladder:  No stones.  No wall thickening.  No pericholecystic fluid.  Common bile duct:  Diameter: 2.8 mm.  Liver:  Liver is abnormal appearance with a coarsened heterogeneous echotexture and decreased through transmission of the sound beam. It is mildly enlarged no discrete liver mass or focal lesion is seen. Hepatopetal flow was documented in the portal vein.  IVC:  No abnormality visualized.  Pancreas:  Visualized portion unremarkable.  Spleen:  Borderline enlarged measuring 10.6 cm. No splenic mass or focal lesion.  Right Kidney:  Length: 11.8 cm. Echogenicity within normal limits. No mass or hydronephrosis visualized.  Left Kidney:  Length: 11.8  cm. Echogenicity within normal limits. No mass or hydronephrosis visualized.  Abdominal aorta:  No aneurysm visualized.  Other findings:  No ascites  IMPRESSION: 1. Abnormal appearance of the liver likely due to a combination of cirrhosis and steatosis. Mild hepatomegaly. No discrete liver mass. 2. No acute findings. Normal gallbladder with no bile duct dilation. Exam is otherwise unremarkable.   Electronically Signed   By: Lajean Manes M.D.   On: 09/28/2013 10:26   Dg Chest Portable 1 View  09/25/2013   CLINICAL DATA:  Shortness of breath and fever  EXAM: PORTABLE CHEST - 1 VIEW  COMPARISON:  January 21, 2013  FINDINGS: Heart is prominent with interstitial edema. There is no airspace consolidation. The pulmonary vascularity is normal.  Port-A-Cath tip is in the superior cava. No pneumothorax. There are surgical clips in the right axillary region.  IMPRESSION: Heart is prominent with interstitial edema. Suspect a degree of congestive heart failure. There is no apparent airspace consolidation.   Electronically Signed   By: Lowella Grip M.D.   On: 09/25/2013 15:58   Dg C-arm 1-60 Min  09/16/2013   CLINICAL DATA:  Ankle fracture  EXAM: LEFT ANKLE - 2 VIEW; DG C-ARM 1-60 MIN  COMPARISON:  09/15/2013  FINDINGS: Single AP view of the left ankle with the C-arm. Plate and screw fixation of distal fibular fracture. Ankle mortise intact on the AP view.  IMPRESSION: Plate fixation of distal fibular fracture.   Electronically Signed   By: Franchot Gallo M.D.   On: 09/16/2013 15:56    Microbiology: Recent Results (from the past 240 hour(s))  URINE CULTURE  Status: None   Collection Time    09/25/13  6:00 PM      Result Value Range Status   Specimen Description URINE, CATHETERIZED   Final   Special Requests NONE   Final   Culture  Setup Time     Final   Value: 09/26/2013 22:38     Performed at [facility]     Final   Value: >=100,000 COLONIES/ML     Performed at [facility]   Culture     Final   Value: Liberal     Performed at [facility]   Report Status PENDING   Incomplete     Labs: Basic Metabolic Panel:  Recent Labs Lab 09/25/13 1656 09/27/13 0540 09/27/13 1341 09/28/13 0459  NA 131* 137  --  135*  K 3.8 2.9*  --  3.7  CL 96 100  --  98  CO2 20 26  --  29  GLUCOSE 97 90  --  80  BUN 4* 5*  --  4*  CREATININE 0.40* 0.50  --  0.44*  CALCIUM 7.8* 7.4*  --  7.7*  MG  --   --  2.1  --    Liver Function Tests:  Recent Labs Lab 09/25/13 1656 09/27/13 0540  AST 49* 42*  ALT 14 11  ALKPHOS 180* 156*  BILITOT 2.0* 1.3*  PROT 6.6 5.7*  ALBUMIN 1.3* 1.2*   No results found for this basename: LIPASE, AMYLASE,  in the last 168 hours  Recent Labs Lab 09/28/13 1612  AMMONIA 38   CBC:  Recent Labs Lab 09/25/13 1656 09/26/13 1554 09/28/13 0459 09/28/13 0853  WBC 10.8* 11.1* 6.9 6.3  NEUTROABS 9.3* 7.7  --   --   HGB 9.5* 10.2* 8.9* 8.8*  HCT 30.4* 32.1* 27.9* 27.5*  MCV 119.7* 119.3* 121.3* 120.6*  PLT 132* 103* 150 144*   Cardiac Enzymes:  Recent Labs Lab 09/25/13 1656  TROPONINI <0.30   BNP: BNP (last 3 results)  Recent Labs  09/25/13 1656  PROBNP 1957.0*   CBG: No results found for this basename: GLUCAP,  in the last 168 hours     Signed:  Reznor Ferrando  [facility] 09/28/2013, 6:15 PM

## 2013-09-28 NOTE — Progress Notes (Signed)
Patient discharged home.  Port flushed with Heparin and deaccessed.  Appointments and medications reviewed with husband.  Instructed husband to call [place] office for follow up in 1 month.  HH PT and RN to follow at home.  Instructed husband not to give lovenox until he hears from Dr. Caryn Section on blood work.  Verbalizes understanding - states he will also talk to [facility] RN to help with medications if he has questions.  Instructed pain medication not to be given more than 4 times per day.  Reviewed s/s of infection to look for.  No questions at this time.  Patient stable to DC - left with EMS to address given by husband.

## 2013-09-28 NOTE — Progress Notes (Signed)
Physical Therapy Treatment Patient Details Name: Elizabeth Orr MRN: [id number] DOB: 27-Jan-1961 Today's Date: 09/28/2013 Time: 5176-1607 PT Time Calculation (min): 28 min  PT Assessment / Plan / Recommendation  History of Present Illness Pt is readmitted to the hospital due to c/o chest pain.  She had been removed from the SNF from which she had discharged to  by her husband.  He was unhappy about the care she received there.  During her stay, she somehow fell on her right knee and sustained a laceration for which sutures were require.  He stated that he took her to  a motel and had to get her "cleaned up".  Later on that day, she c/o chest pain so he had her treansferred here..  Pt is very confused and very tearful.  Earlier, she stated that she was too weak to feed herself.  Currently she states that she feels too weak to try to transfer.   PT Comments   "I can't " to everything asked of pt  Follow Up Recommendations   SNF     Does the patient have the potential to tolerate intense rehabilitation   no  Barriers to Discharge  immobility      Equipment Recommendations    none   Recommendations for Other Services  OT  Frequency   daily 5x  Progress towards PT Goals  slow  Plan  (pt needs SNF but family refuses)    Precautions / Restrictions Restrictions Weight Bearing Restrictions: Yes RLE Weight Bearing: Weight bearing as tolerated RLE Partial Weight Bearing Percentage or Pounds: old femoral fx. LLE Weight Bearing: Partial weight bearing   Pertinent Vitals/Pain None verbalized    Mobility  Bed Mobility Overal bed mobility: Needs Assistance Bed Mobility: Supine to Sit;Sit to Supine Sidelying to sit: Mod assist Supine to sit: Mod assist Sit to supine: Min assist General bed mobility comments: Pt sat EOB x 10 minutes tried to work on using UE to lift bottom to move to the head of bed but pt would only cry    Exercises General Exercises - Lower Extremity Quad Sets: 10  reps Heel Slides: 10 reps (able to do active with much encouragement) Hip ABduction/ADduction: 10 reps (able to do active with much encouragement) Straight Leg Raises: 10 reps;AROM   PT Diagnosis:  weakness PT Problem List:  difficulty walking PT Treatment Interventions:   there ex, there activity  PT Goals (current goals can now be found in the care plan section)    Visit Information  Last PT Received On: 09/28/13 History of Present Illness: Pt is readmitted to the hospital due to c/o chest pain.  She had been removed from the SNF from which she had discharged to  by her husband.  He was unhappy about the care she received there.  During her stay, she somehow fell on her right knee and sustained a laceration for which sutures were require.  He stated that he took her to  a motel and had to get her "cleaned up".  Later on that day, she c/o chest pain so he had her treansferred here..  Pt is very confused and very tearful.  Earlier, she stated that she was too weak to feed herself.  Currently she states that she feels too weak to try to transfer.    Subjective Data      Cognition  Cognition Arousal/Alertness: Awake/alert Behavior During Therapy: Restless (Pt crying throughout treatment stating "I can't prior to even trying to do anything)  Balance     End of Session PT - End of Session Activity Tolerance:  (pt seems emotionally distraught) Patient left: in bed;with call bell/phone within reach;with family/visitor present   GP     RUSSELL,CINDY 09/28/2013, 4:33 PM

## 2013-09-29 LAB — URINE CULTURE: Colony Count: >=100000

## 2013-09-29 LAB — HEPARIN INDUCED THROMBOCYTOPENIA PNL
Heparin Induced Plt Ab: NEGATIVE
Patient O.D.: 0.133
UFH High Dose UFH H: 0 % Release
UFH LOW DOSE 0.1 IU/ML: 0 %
UFH Low Dose 0.5 IU/mL: 0 % Release
UFH SRA RESULT: NEGATIVE

## 2013-09-29 LAB — CERULOPLASMIN: CERULOPLASMIN: 18 mg/dL — AB (ref 20–60)

## 2013-09-30 NOTE — Treatment Plan (Signed)
The patient's husband was called twice on his cell phone at [phone] and twice on his home phone at [phone], but there was no answer and the voice mail had not been set up. With the help of case manager, Amy, home health nurse Malachy Mood was contacted. I instructed her to tell the patient (patient's husband) to restart Lovenox injections (40 mg daily) for the remainder of the course-25 more days-as prescribed. The patient's HIT panel was negative, but she will need her platelet count reassessed at her follow up appointments.

## 2013-10-07 ENCOUNTER — Emergency Department (HOSPITAL_COMMUNITY): Payer: Medicaid Other

## 2013-10-07 ENCOUNTER — Inpatient Hospital Stay (HOSPITAL_COMMUNITY)
Admission: EM | Admit: 2013-10-07 | Discharge: 2013-10-11 | DRG: 100 | Disposition: A | Discharge status: Home Health | Payer: Medicaid Other | Attending: Family Medicine | Admitting: Family Medicine

## 2013-10-07 ENCOUNTER — Encounter (HOSPITAL_COMMUNITY): Payer: Self-pay | Admitting: Emergency Medicine

## 2013-10-07 DIAGNOSIS — Z7982 Long term (current) use of aspirin: Secondary | ICD-10-CM

## 2013-10-07 DIAGNOSIS — G894 Chronic pain syndrome: Secondary | ICD-10-CM | POA: Diagnosis present

## 2013-10-07 DIAGNOSIS — G9340 Encephalopathy, unspecified: Secondary | ICD-10-CM

## 2013-10-07 DIAGNOSIS — Z8249 Family history of ischemic heart disease and other diseases of the circulatory system: Secondary | ICD-10-CM

## 2013-10-07 DIAGNOSIS — E876 Hypokalemia: Secondary | ICD-10-CM

## 2013-10-07 DIAGNOSIS — Z23 Encounter for immunization: Secondary | ICD-10-CM

## 2013-10-07 DIAGNOSIS — F329 Major depressive disorder, single episode, unspecified: Secondary | ICD-10-CM | POA: Diagnosis present

## 2013-10-07 DIAGNOSIS — C50919 Malignant neoplasm of unspecified site of unspecified female breast: Secondary | ICD-10-CM | POA: Diagnosis present

## 2013-10-07 DIAGNOSIS — Z833 Family history of diabetes mellitus: Secondary | ICD-10-CM

## 2013-10-07 DIAGNOSIS — R7401 Elevation of levels of liver transaminase levels: Secondary | ICD-10-CM

## 2013-10-07 DIAGNOSIS — F411 Generalized anxiety disorder: Secondary | ICD-10-CM | POA: Diagnosis present

## 2013-10-07 DIAGNOSIS — R4182 Altered mental status, unspecified: Secondary | ICD-10-CM | POA: Insufficient documentation

## 2013-10-07 DIAGNOSIS — K219 Gastro-esophageal reflux disease without esophagitis: Secondary | ICD-10-CM | POA: Diagnosis present

## 2013-10-07 DIAGNOSIS — Z9010 Acquired absence of unspecified breast and nipple: Secondary | ICD-10-CM

## 2013-10-07 DIAGNOSIS — F1920 Other psychoactive substance dependence, uncomplicated: Secondary | ICD-10-CM | POA: Diagnosis present

## 2013-10-07 DIAGNOSIS — F19939 Other psychoactive substance use, unspecified with withdrawal, unspecified: Secondary | ICD-10-CM

## 2013-10-07 DIAGNOSIS — Z91199 Patient's noncompliance with other medical treatment and regimen due to unspecified reason: Secondary | ICD-10-CM

## 2013-10-07 DIAGNOSIS — Z923 Personal history of irradiation: Secondary | ICD-10-CM

## 2013-10-07 DIAGNOSIS — R109 Unspecified abdominal pain: Secondary | ICD-10-CM | POA: Diagnosis present

## 2013-10-07 DIAGNOSIS — F1020 Alcohol dependence, uncomplicated: Secondary | ICD-10-CM | POA: Diagnosis present

## 2013-10-07 DIAGNOSIS — Z79899 Other long term (current) drug therapy: Secondary | ICD-10-CM

## 2013-10-07 DIAGNOSIS — F17200 Nicotine dependence, unspecified, uncomplicated: Secondary | ICD-10-CM | POA: Diagnosis present

## 2013-10-07 DIAGNOSIS — Z9119 Patient's noncompliance with other medical treatment and regimen: Secondary | ICD-10-CM

## 2013-10-07 DIAGNOSIS — F1120 Opioid dependence, uncomplicated: Secondary | ICD-10-CM

## 2013-10-07 DIAGNOSIS — R569 Unspecified convulsions: Principal | ICD-10-CM

## 2013-10-07 DIAGNOSIS — E8809 Other disorders of plasma-protein metabolism, not elsewhere classified: Secondary | ICD-10-CM | POA: Diagnosis present

## 2013-10-07 DIAGNOSIS — R740 Nonspecific elevation of levels of transaminase and lactic acid dehydrogenase [LDH]: Secondary | ICD-10-CM

## 2013-10-07 DIAGNOSIS — K760 Fatty (change of) liver, not elsewhere classified: Secondary | ICD-10-CM

## 2013-10-07 DIAGNOSIS — E860 Dehydration: Secondary | ICD-10-CM | POA: Diagnosis present

## 2013-10-07 DIAGNOSIS — F3289 Other specified depressive episodes: Secondary | ICD-10-CM | POA: Diagnosis present

## 2013-10-07 DIAGNOSIS — F1123 Opioid dependence with withdrawal: Secondary | ICD-10-CM

## 2013-10-07 DIAGNOSIS — F1027 Alcohol dependence with alcohol-induced persisting dementia: Secondary | ICD-10-CM | POA: Diagnosis present

## 2013-10-07 DIAGNOSIS — K7030 Alcoholic cirrhosis of liver without ascites: Secondary | ICD-10-CM

## 2013-10-07 DIAGNOSIS — F1193 Opioid use, unspecified with withdrawal: Secondary | ICD-10-CM

## 2013-10-07 HISTORY — DX: Unspecified convulsions: R56.9

## 2013-10-07 LAB — CBC WITH DIFFERENTIAL/PLATELET
BASOS ABS: 0.1 10*3/uL (ref 0.0–0.1)
Basophils Relative: 1 % (ref 0–1)
EOS PCT: 2 % (ref 0–5)
Eosinophils Absolute: 0.2 10*3/uL (ref 0.0–0.7)
HCT: 33.4 % — ABNORMAL LOW (ref 36.0–46.0)
Hemoglobin: 10.8 g/dL — ABNORMAL LOW (ref 12.0–15.0)
LYMPHS ABS: 2 10*3/uL (ref 0.7–4.0)
Lymphocytes Relative: 26 % (ref 12–46)
MCH: 38.3 pg — ABNORMAL HIGH (ref 26.0–34.0)
MCHC: 32.3 g/dL (ref 30.0–36.0)
MCV: 118.4 fL — AB (ref 78.0–100.0)
MONOS PCT: 7 % (ref 3–12)
Monocytes Absolute: 0.5 10*3/uL (ref 0.1–1.0)
NEUTROS PCT: 64 % (ref 43–77)
Neutro Abs: 4.7 10*3/uL (ref 1.7–7.7)
Platelets: 202 10*3/uL (ref 150–400)
RBC: 2.82 MIL/uL — ABNORMAL LOW (ref 3.87–5.11)
RDW: 17.1 % — AB (ref 11.5–15.5)
WBC: 7.5 10*3/uL (ref 4.0–10.5)

## 2013-10-07 LAB — URINALYSIS, ROUTINE W REFLEX MICROSCOPIC
GLUCOSE, UA: NEGATIVE mg/dL
Hgb urine dipstick: NEGATIVE
Ketones, ur: NEGATIVE mg/dL
LEUKOCYTES UA: NEGATIVE
Nitrite: NEGATIVE
PH: 5.5 (ref 5.0–8.0)
Protein, ur: NEGATIVE mg/dL
SPECIFIC GRAVITY, URINE: 1.02 (ref 1.005–1.030)
Urobilinogen, UA: 4 mg/dL — ABNORMAL HIGH (ref 0.0–1.0)

## 2013-10-07 LAB — COMPREHENSIVE METABOLIC PANEL
ALBUMIN: 1.7 g/dL — AB (ref 3.5–5.2)
ALT: 14 U/L (ref 0–35)
AST: 51 U/L — ABNORMAL HIGH (ref 0–37)
Alkaline Phosphatase: 207 U/L — ABNORMAL HIGH (ref 39–117)
BUN: 3 mg/dL — ABNORMAL LOW (ref 6–23)
CALCIUM: 8 mg/dL — AB (ref 8.4–10.5)
CO2: 26 mEq/L (ref 19–32)
Chloride: 88 mEq/L — ABNORMAL LOW (ref 96–112)
Creatinine, Ser: 0.4 mg/dL — ABNORMAL LOW (ref 0.50–1.10)
GFR calc non Af Amer: >90 mL/min (ref >90)
GLUCOSE: 102 mg/dL — AB (ref 70–99)
POTASSIUM: 2.3 meq/L — AB (ref 3.7–5.3)
SODIUM: 131 meq/L — AB (ref 137–147)
TOTAL PROTEIN: 6.3 g/dL (ref 6.0–8.3)
Total Bilirubin: 2.1 mg/dL — ABNORMAL HIGH (ref 0.3–1.2)

## 2013-10-07 LAB — AMMONIA: Ammonia: 47 umol/L (ref 11–60)

## 2013-10-07 LAB — MAGNESIUM: MAGNESIUM: 1.8 mg/dL (ref 1.5–2.5)

## 2013-10-07 LAB — CBG MONITORING, ED: Glucose-Capillary: 93 mg/dL (ref 70–99)

## 2013-10-07 MED ORDER — SODIUM CHLORIDE 0.9 % IV BOLUS (SEPSIS)
500.0000 mL | Freq: Once | INTRAVENOUS | Status: AC
  Administered 2013-10-07: 500 mL via INTRAVENOUS

## 2013-10-07 MED ORDER — LORAZEPAM 2 MG/ML IJ SOLN
0.5000 mg | Freq: Once | INTRAMUSCULAR | Status: AC
  Administered 2013-10-07: 0.5 mg via INTRAVENOUS

## 2013-10-07 MED ORDER — LORAZEPAM 2 MG/ML IJ SOLN
INTRAMUSCULAR | Status: AC
  Filled 2013-10-07: qty 1

## 2013-10-07 MED ORDER — MORPHINE SULFATE 2 MG/ML IJ SOLN
1.0000 mg | Freq: Once | INTRAMUSCULAR | Status: AC
  Administered 2013-10-08: 1 mg via INTRAVENOUS
  Filled 2013-10-07: qty 1

## 2013-10-07 MED ORDER — POTASSIUM CHLORIDE 10 MEQ/100ML IV SOLN
10.0000 meq | INTRAVENOUS | Status: AC
  Administered 2013-10-08 (×3): 10 meq via INTRAVENOUS
  Filled 2013-10-07 (×4): qty 100

## 2013-10-07 NOTE — ED Notes (Signed)
CRITICAL VALUE ALERT  Critical value received:POTASSIUM 2.3  Date of notification: 10/07/13  Time of notification:  2145  Critical value read back:yes  Nurse who received alert:  Eilene Ghazi  MD notified (1st page):  PICKERING  Time of first page: 2147  MD notified (2nd page):  Time of second page:  Responding MD: Alvino Chapel  Time MD responded: 2147

## 2013-10-07 NOTE — ED Notes (Signed)
PT TO [facility] FROM EMS. ASSISTED PT TO STRETCHER AND PLACED PADDED

## 2013-10-07 NOTE — ED Notes (Signed)
Pt removed from bedpan & cleaned. Barrier cream applied.

## 2013-10-07 NOTE — ED Provider Notes (Deleted)
CSN: [id number]     Arrival date & time 10/07/13  2016 History  This chart was scribed for No att. providers found by Elby Beck, ED Scribe. This patient was seen in room [facility]16A/[facility]16A and the patient's care was started at .   Chief Complaint  Patient presents with  . Seizures    The history is provided by the patient. No language interpreter was used.    HPI Comments: Elizabeth Orr is a 53 y.o. Female who presents to the Emergency Department complaining of       Past Medical History  Diagnosis Date  . Kidney stones 1989  . GERD (gastroesophageal reflux disease)   . History of stomach ulcers 2003/2007    Hx of esophageal ulcers and stomach ulcers due to reflux  . Back pain   . Chronic leg pain   . Polysubstance abuse   . Depression   . Anxiety   . Exertional shortness of breath   . History of blood transfusion     "recently had my 2nd; related to chemo" (01/21/2013)  . NATFTDDU(202.5)     "monthly" (01/21/2013)  . Breast cancer 06/08/12    right mastectomy  . Infiltrating ductal carcinoma of breast 07/09/2012    Neijstrom:  chemo  . Hepatic steatosis 09/26/2013  . Anemia, iron deficiency 09/26/2013  . Macrocytosis 09/26/2013  . Alcoholism 09/27/2013    In recovery  . Alcoholic cirrhosis of liver 09/28/2013   Past Surgical History  Procedure Laterality Date  . Esophagogastroduodenoscopy    . Esophageal dilation    . Portacath placement  07/22/2012    Procedure: INSERTION PORT-A-CATH;  Surgeon: Donato Heinz, MD;  Location: [facility];  Service: General;  Laterality: Left;  left subclavian  . Esophagogastroduodenoscopy (egd) with esophageal dilation  10/04/2005    Rourk-Extensive geographic ulcerations distal third of the tubular esoophagus consistent with severe ulcerative relux esophagitis, early stricture formation status post dilation as described above. 2.  Multiple gastric ulcers without bleeding stigmata as described above. otherwise normal stomach.  3. large bulbar ulcer  without bleeding stigmata. otherwise D1 and D2 appeared normal.  . Colonoscopy, esophagogastroduodenoscopy (egd) and esophageal dilation  08/21/2012    Procedure: COLONOSCOPY, ESOPHAGOGASTRODUODENOSCOPY (EGD) AND ESOPHAGEAL DILATION;  Surgeon: Daneil Dolin, MD;  [facility];  Service: Endoscopy;  Laterality: N/A;  9:30  . Breast biopsy Right 04/2012  . Mastectomy Right 06/08/12  . Tubal ligation  1989  . Femur im nail Right 01/21/2013    Procedure: INTRAMEDULLARY (IM) RETROGRADE FEMORAL NAILING;  Surgeon: Hessie Dibble, MD;  Location: [facility];  Service: Orthopedics;  Laterality: Right;  . Orif ankle fracture Left 09/15/2013    Procedure: OPEN REDUCTION INTERNAL FIXATION (ORIF) LEFTANKLE FRACTURE;  Surgeon: Sanjuana Kava, MD;  Location: [facility];  Service: Orthopedics;  Laterality: Left;   Family History  Problem Relation Age of Onset  . Heart attack Father   . COPD Father   . Alcohol abuse Brother   . Alcohol abuse Brother   . Diabetes Mother    History  Substance Use Topics  . Smoking status: Current Some Day Smoker -- 0.25 packs/day for 26 years    Last Attempt to Quit: 08/07/2011  . Smokeless tobacco: Never Used  . Alcohol Use: 0.0 oz/week     Comment: occ   OB History   Grav Para Term Preterm Abortions TAB SAB Ect Mult Living   6 6        6  Review of Systems  Neurological: Positive for seizures.  All other systems reviewed and are negative.    Allergies  Citalopram; Effexor; Motrin; and Trazodone and nefazodone  Home Medications   Current Outpatient Rx  Name  Route  Sig  Dispense  Refill  . albuterol (PROVENTIL HFA;VENTOLIN HFA) 108 (90 BASE) MCG/ACT inhaler   Inhalation   Inhale 2 puffs into the lungs every 6 (six) hours as needed for wheezing.   1 Inhaler   2   . aspirin EC 81 MG EC tablet   Oral   Take 1 tablet (81 mg total) by mouth daily.         . benzonatate (TESSALON) 100 MG capsule   Oral   Take 1 capsule (100 mg total) by mouth 3  (three) times daily.   20 capsule   0   . cefUROXime (CEFTIN) 500 MG tablet   Oral   Take 1 tablet (500 mg total) by mouth 2 (two) times daily with a meal. Antibiotic to be taken for 5 more days, starting tomorrow.   10 tablet   0   . docusate sodium (COLACE) 100 MG capsule   Oral   Take 100 mg by mouth 2 (two) times daily.         Marland Kitchen enoxaparin (LOVENOX) 40 MG/0.4ML injection   Subcutaneous   Inject 0.4 mLs (40 mg total) into the skin daily. Not use this medication until you are notified by Dr. Caryn Section.   25 Syringe   0   . furosemide (LASIX) 40 MG tablet   Oral   Take 1 tablet (40 mg total) by mouth daily. Take for swelling.   30 tablet   2   . gabapentin (NEURONTIN) 300 MG capsule   Oral   Take 3 capsules (900 mg total) by mouth 2 (two) times daily.   60 capsule   2   . iron polysaccharides (NIFEREX) 150 MG capsule   Oral   Take 1 capsule (150 mg total) by mouth daily.   30 capsule   5   . magnesium hydroxide (MILK OF MAGNESIA) 400 MG/5ML suspension   Oral   Take 30 mLs by mouth daily as needed for constipation.         . Multiple Vitamins-Iron (MULTIVITAMINS WITH IRON) TABS tablet   Oral   Take 1 tablet by mouth daily.         Marland Kitchen omeprazole (PRILOSEC) 20 MG capsule   Oral   Take 1 capsule (20 mg total) by mouth daily.   30 capsule   2   . oxyCODONE-acetaminophen (PERCOCET/ROXICET) 5-325 MG per tablet   Oral   Take 1 tablet by mouth every 4 (four) hours as needed for moderate pain or severe pain.   45 tablet   0   . polyethylene glycol (MIRALAX / GLYCOLAX) packet   Oral   Take 17 g by mouth daily.         . potassium chloride SA (K-DUR,KLOR-CON) 20 MEQ tablet   Oral   Take 1 tablet (20 mEq total) by mouth daily.   30 tablet   2   . spironolactone (ALDACTONE) 25 MG tablet   Oral   Take 1 tablet (25 mg total) by mouth daily. For generalized swelling.   30 tablet   2   . sucralfate (CARAFATE) 1 GM/10ML suspension   Oral   Take 10 mLs (1  g total) by mouth 2 (two) times daily.   420 mL  0   . tamoxifen (NOLVADEX) 20 MG tablet   Oral   Take 1 tablet (20 mg total) by mouth daily.   30 tablet   11   . thiamine 100 MG tablet   Oral   Take 1 tablet (100 mg total) by mouth daily.          Triage Vitals: BP 120/85  Pulse 117  Temp(Src) 98.3 F (36.8 C) (Axillary)  Resp 20  Ht 5\' 2"  (1.575 m)  Wt 222 lb (100.699 kg)  BMI 40.59 kg/m2  SpO2 99%  Physical Exam  Nursing note and vitals reviewed. Constitutional: She is oriented to person, place, and time. She appears well-developed and well-nourished. No distress.  HENT:  Head: Normocephalic and atraumatic.  Eyes: EOM are normal.  Neck: Neck supple. No tracheal deviation present.  Cardiovascular: Normal rate.   Pulmonary/Chest: Effort normal. No respiratory distress.  Musculoskeletal: Normal range of motion.  Neurological: She is alert and oriented to person, place, and time.  Skin: Skin is warm and dry.  Psychiatric: She has a normal mood and affect. Her behavior is normal.    ED Course  Procedures (including critical care time)  DIAGNOSTIC STUDIES: Oxygen Saturation is 99% on RA, normal by my interpretation.    COORDINATION OF CARE: 8:36 PM- Pt advised of plan for treatment and pt agrees.  Labs Review Labs Reviewed  CBG MONITORING, ED   Imaging Review No results found.  EKG Interpretation   None       MDM   Final diagnoses:  None      Ovid Curd R. Alvino Chapel, MD 10/07/13 2105

## 2013-10-07 NOTE — ED Notes (Signed)
Pt arrived from home by EMS. Pt had a witness seizure by EMS. No history of seizures.

## 2013-10-07 NOTE — ED Provider Notes (Signed)
CSN: [id number]     Arrival date & time 10/07/13  2016 History   First MD Initiated Contact with Patient 10/07/13 2055     Chief Complaint  Patient presents with  . Seizures   Level V caveat due to altered mental status.  (Consider location/radiation/quality/duration/timing/severity/associated sxs/prior Treatment) Patient is a 53 y.o. female presenting with seizures. The history is provided by a relative.  Seizures  patient was brought to the hospital with possible seizures. Had had episodes of shaking at home. Then would have confusion. No previous history of seizures.. No trauma. No cough. She was recently admitted for some encephalopathy and UTI after a foot surgery.  Past Medical History  Diagnosis Date  . Kidney stones 1989  . GERD (gastroesophageal reflux disease)   . History of stomach ulcers 2003/2007    Hx of esophageal ulcers and stomach ulcers due to reflux  . Back pain   . Chronic leg pain   . Polysubstance abuse   . Depression   . Anxiety   . Exertional shortness of breath   . History of blood transfusion     "recently had my 2nd; related to chemo" (01/21/2013)  . WNIOEVOJ(500.9)     "monthly" (01/21/2013)  . Breast cancer 06/08/12    right mastectomy  . Infiltrating ductal carcinoma of breast 07/09/2012    Neijstrom:  chemo  . Hepatic steatosis 09/26/2013  . Anemia, iron deficiency 09/26/2013  . Macrocytosis 09/26/2013  . Alcoholism 09/27/2013    In recovery  . Alcoholic cirrhosis of liver 09/28/2013   Past Surgical History  Procedure Laterality Date  . Esophagogastroduodenoscopy    . Esophageal dilation    . Portacath placement  07/22/2012    Procedure: INSERTION PORT-A-CATH;  Surgeon: Donato Heinz, MD;  Location: [facility];  Service: General;  Laterality: Left;  left subclavian  . Esophagogastroduodenoscopy (egd) with esophageal dilation  10/04/2005    Rourk-Extensive geographic ulcerations distal third of the tubular esoophagus consistent with severe ulcerative  relux esophagitis, early stricture formation status post dilation as described above. 2.  Multiple gastric ulcers without bleeding stigmata as described above. otherwise normal stomach.  3. large bulbar ulcer without bleeding stigmata. otherwise D1 and D2 appeared normal.  . Colonoscopy, esophagogastroduodenoscopy (egd) and esophageal dilation  08/21/2012    Procedure: COLONOSCOPY, ESOPHAGOGASTRODUODENOSCOPY (EGD) AND ESOPHAGEAL DILATION;  Surgeon: Daneil Dolin, MD;  [facility];  Service: Endoscopy;  Laterality: N/A;  9:30  . Breast biopsy Right 04/2012  . Mastectomy Right 06/08/12  . Tubal ligation  1989  . Femur im nail Right 01/21/2013    Procedure: INTRAMEDULLARY (IM) RETROGRADE FEMORAL NAILING;  Surgeon: Hessie Dibble, MD;  Location: [facility];  Service: Orthopedics;  Laterality: Right;  . Orif ankle fracture Left 09/15/2013    Procedure: OPEN REDUCTION INTERNAL FIXATION (ORIF) LEFTANKLE FRACTURE;  Surgeon: Sanjuana Kava, MD;  Location: [facility];  Service: Orthopedics;  Laterality: Left;   Family History  Problem Relation Age of Onset  . Heart attack Father   . COPD Father   . Alcohol abuse Brother   . Alcohol abuse Brother   . Diabetes Mother    History  Substance Use Topics  . Smoking status: Current Some Day Smoker -- 0.25 packs/day for 26 years    Last Attempt to Quit: 08/07/2011  . Smokeless tobacco: Never Used  . Alcohol Use: 0.0 oz/week     Comment: occ   OB History   Grav Para Term Preterm Abortions TAB SAB  Ect Mult Living   6 6        6      Review of Systems  Unable to perform ROS Neurological: Positive for seizures.      Allergies  Citalopram; Effexor; Motrin; and Trazodone and nefazodone  Home Medications   Current Outpatient Rx  Name  Route  Sig  Dispense  Refill  . albuterol (PROVENTIL HFA;VENTOLIN HFA) 108 (90 BASE) MCG/ACT inhaler   Inhalation   Inhale 2 puffs into the lungs every 6 (six) hours as needed for wheezing.   1 Inhaler   2   .  aspirin EC 81 MG EC tablet   Oral   Take 1 tablet (81 mg total) by mouth daily.         Marland Kitchen docusate sodium (COLACE) 100 MG capsule   Oral   Take 100 mg by mouth 2 (two) times daily.         Marland Kitchen enoxaparin (LOVENOX) 40 MG/0.4ML injection   Subcutaneous   Inject 0.4 mLs (40 mg total) into the skin daily. Not use this medication until you are notified by Dr. Caryn Section.   25 Syringe   0   . furosemide (LASIX) 40 MG tablet   Oral   Take 1 tablet (40 mg total) by mouth daily. Take for swelling.   30 tablet   2   . gabapentin (NEURONTIN) 300 MG capsule   Oral   Take 3 capsules (900 mg total) by mouth 2 (two) times daily.   60 capsule   2   . iron polysaccharides (NIFEREX) 150 MG capsule   Oral   Take 1 capsule (150 mg total) by mouth daily.   30 capsule   5   . magnesium hydroxide (MILK OF MAGNESIA) 400 MG/5ML suspension   Oral   Take 30 mLs by mouth daily as needed for constipation.         . Multiple Vitamins-Iron (MULTIVITAMINS WITH IRON) TABS tablet   Oral   Take 1 tablet by mouth daily.         Marland Kitchen omeprazole (PRILOSEC) 20 MG capsule   Oral   Take 1 capsule (20 mg total) by mouth daily.   30 capsule   2   . oxyCODONE-acetaminophen (PERCOCET/ROXICET) 5-325 MG per tablet   Oral   Take 1 tablet by mouth every 4 (four) hours as needed for moderate pain or severe pain.   45 tablet   0   . polyethylene glycol (MIRALAX / GLYCOLAX) packet   Oral   Take 17 g by mouth daily.         . potassium chloride SA (K-DUR,KLOR-CON) 20 MEQ tablet   Oral   Take 1 tablet (20 mEq total) by mouth daily.   30 tablet   2   . spironolactone (ALDACTONE) 25 MG tablet   Oral   Take 1 tablet (25 mg total) by mouth daily. For generalized swelling.   30 tablet   2   . sucralfate (CARAFATE) 1 GM/10ML suspension   Oral   Take 10 mLs (1 g total) by mouth 2 (two) times daily.   420 mL   0   . thiamine 100 MG tablet   Oral   Take 1 tablet (100 mg total) by mouth daily.          . tamoxifen (NOLVADEX) 20 MG tablet   Oral   Take 1 tablet (20 mg total) by mouth daily.   30 tablet   11  BP 104/69  Pulse 111  Temp(Src) 98.2 F (36.8 C) (Oral)  Resp 20  Ht 5\' 2"  (1.575 m)  Wt 222 lb (100.699 kg)  BMI 40.59 kg/m2  SpO2 100% Physical Exam  Constitutional: She appears well-developed.  HENT:  Mucous membranes are dry.  Eyes: Pupils are equal, round, and reactive to light.  Patient is staring straight ahead, and time she would not have much throughout reflex, but also with touching her eyelashes her eyes would blink and then she looked up at me.  Neck: Neck supple.  Cardiovascular:  Tachycardia  Pulmonary/Chest: Effort normal and breath sounds normal.  Abdominal: There is no tenderness.  Musculoskeletal:  Left lower leg in cast  Neurological:  Patient with decreased level of consciousness. sHe is staring ahead. She'll not follow commands. She'll drop her hands on to her face when held above her head. Reflexes appear normal. She is breathing spontaneously. Touching her eyelashes cause her to blink and look at me. She then went back to staring straight ahead.  Skin: Skin is warm.    ED Course  Procedures (including critical care time) Labs Review Labs Reviewed  CBC WITH DIFFERENTIAL - Abnormal; Notable for the following:    RBC 2.82 (*)    Hemoglobin 10.8 (*)    HCT 33.4 (*)    MCV 118.4 (*)    MCH 38.3 (*)    RDW 17.1 (*)    All other components within normal limits  COMPREHENSIVE METABOLIC PANEL - Abnormal; Notable for the following:    Sodium 131 (*)    Potassium 2.3 (*)    Chloride 88 (*)    Glucose, Bld 102 (*)    BUN 3 (*)    Creatinine, Ser 0.40 (*)    Calcium 8.0 (*)    Albumin 1.7 (*)    AST 51 (*)    Alkaline Phosphatase 207 (*)    Total Bilirubin 2.1 (*)    All other components within normal limits  URINALYSIS, ROUTINE W REFLEX MICROSCOPIC - Abnormal; Notable for the following:    Color, Urine AMBER (*)    Bilirubin Urine  MODERATE (*)    Urobilinogen, UA 4.0 (*)    All other components within normal limits  AMMONIA  MAGNESIUM  LACTIC ACID, PLASMA  CBG MONITORING, ED   Imaging Review Dg Chest 1 View  10/07/2013   CLINICAL DATA:  Altered mental status.  EXAM: CHEST - 1 VIEW  COMPARISON:  09/25/2013  FINDINGS: Left Port-A-Cath remains in place, unchanged. Heart is upper limits normal in size. No confluent airspace opacities or effusions. Resolved edema since prior study. No acute bony abnormality.  IMPRESSION: No acute cardiopulmonary disease.   Electronically Signed   By: Rolm Baptise M.D.   On: 10/07/2013 22:09   Ct Head Wo Contrast  10/07/2013   CLINICAL DATA:  Seizures, altered mental status.  EXAM: CT HEAD WITHOUT CONTRAST  TECHNIQUE: Contiguous axial images were obtained from the base of the skull through the vertex without intravenous contrast.  COMPARISON:  CT HEAD W/O CM dated 09/28/2013  FINDINGS: No acute intracranial abnormality. Specifically, no hemorrhage, hydrocephalus, mass lesion, acute infarction, or significant intracranial injury. No acute calvarial abnormality. Visualized paranasal sinuses and mastoids clear. Orbital soft tissues unremarkable.  IMPRESSION: Normal study.   Electronically Signed   By: Rolm Baptise M.D.   On: 10/07/2013 22:16    EKG Interpretation    Date/Time:  Thursday October 07 2013 20:24:17 EST Ventricular Rate:  113 PR Interval:  124 QRS Duration: 74 QT Interval:  380 QTC Calculation: 521 R Axis:   -6 Text Interpretation:  Sinus tachycardia with occasional Premature ventricular complexes Nonspecific ST and T wave abnormality Prolonged QT Abnormal ECG When compared with ECG of 25-Sep-2013 15:45, Premature supraventricular complexes are no longer Present Confirmed by Alvino Chapel  MD, Estelita Iten ([id number]) on 10/07/2013 9:09:11 PM            MDM   Final diagnoses:  Altered mental status  Hypokalemia    Patient with altered mental status. Possible seizure activity, but  shaking witnessed was not necessarily seizure-like. There was a possible post ictal period. Lab work shows hypokalemia. Will be admitted to internal medicine.    Jasper Riling. Alvino Chapel, MD 10/08/13 0002

## 2013-10-08 ENCOUNTER — Inpatient Hospital Stay (HOSPITAL_COMMUNITY): Payer: Medicaid Other

## 2013-10-08 ENCOUNTER — Inpatient Hospital Stay (HOSPITAL_COMMUNITY)
Admit: 2013-10-08 | Discharge: 2013-10-08 | Disposition: A | Discharge status: Home / Self Care | Payer: Medicaid Other | Attending: Internal Medicine | Admitting: Internal Medicine

## 2013-10-08 ENCOUNTER — Encounter (HOSPITAL_COMMUNITY): Payer: Self-pay | Admitting: Internal Medicine

## 2013-10-08 DIAGNOSIS — F1193 Opioid use, unspecified with withdrawal: Secondary | ICD-10-CM | POA: Diagnosis present

## 2013-10-08 DIAGNOSIS — G9340 Encephalopathy, unspecified: Secondary | ICD-10-CM

## 2013-10-08 DIAGNOSIS — R569 Unspecified convulsions: Secondary | ICD-10-CM

## 2013-10-08 DIAGNOSIS — F1123 Opioid dependence with withdrawal: Secondary | ICD-10-CM | POA: Diagnosis present

## 2013-10-08 DIAGNOSIS — R109 Unspecified abdominal pain: Secondary | ICD-10-CM | POA: Diagnosis present

## 2013-10-08 HISTORY — DX: Unspecified convulsions: R56.9

## 2013-10-08 LAB — LIPASE, BLOOD: LIPASE: 7 U/L — AB (ref 11–59)

## 2013-10-08 LAB — COMPREHENSIVE METABOLIC PANEL
ALBUMIN: 1.6 g/dL — AB (ref 3.5–5.2)
ALK PHOS: 190 U/L — AB (ref 39–117)
ALT: 13 U/L (ref 0–35)
AST: 40 U/L — AB (ref 0–37)
BILIRUBIN TOTAL: 2.2 mg/dL — AB (ref 0.3–1.2)
BUN: 3 mg/dL — ABNORMAL LOW (ref 6–23)
CHLORIDE: 96 meq/L (ref 96–112)
CO2: 31 mEq/L (ref 19–32)
Calcium: 7.5 mg/dL — ABNORMAL LOW (ref 8.4–10.5)
Creatinine, Ser: 0.36 mg/dL — ABNORMAL LOW (ref 0.50–1.10)
GFR calc Af Amer: >90 mL/min (ref >90)
GFR calc non Af Amer: >90 mL/min (ref >90)
Glucose, Bld: 93 mg/dL (ref 70–99)
POTASSIUM: 2.4 meq/L — AB (ref 3.7–5.3)
Sodium: 136 mEq/L — ABNORMAL LOW (ref 137–147)
Total Protein: 5.7 g/dL — ABNORMAL LOW (ref 6.0–8.3)

## 2013-10-08 LAB — BASIC METABOLIC PANEL
BUN: 3 mg/dL — ABNORMAL LOW (ref 6–23)
CALCIUM: 8 mg/dL — AB (ref 8.4–10.5)
CO2: 28 meq/L (ref 19–32)
Chloride: 98 mEq/L (ref 96–112)
Creatinine, Ser: 0.34 mg/dL — ABNORMAL LOW (ref 0.50–1.10)
GFR calc non Af Amer: >90 mL/min (ref >90)
Glucose, Bld: 90 mg/dL (ref 70–99)
Potassium: 3.7 mEq/L (ref 3.7–5.3)
Sodium: 136 mEq/L — ABNORMAL LOW (ref 137–147)

## 2013-10-08 LAB — CBC
HCT: 31 % — ABNORMAL LOW (ref 36.0–46.0)
Hemoglobin: 10.1 g/dL — ABNORMAL LOW (ref 12.0–15.0)
MCH: 38.8 pg — AB (ref 26.0–34.0)
MCHC: 32.6 g/dL (ref 30.0–36.0)
MCV: 119.2 fL — AB (ref 78.0–100.0)
Platelets: 183 10*3/uL (ref 150–400)
RBC: 2.6 MIL/uL — ABNORMAL LOW (ref 3.87–5.11)
RDW: 17.1 % — AB (ref 11.5–15.5)
WBC: 5.7 10*3/uL (ref 4.0–10.5)

## 2013-10-08 LAB — LACTIC ACID, PLASMA: LACTIC ACID, VENOUS: 5.2 mmol/L — AB (ref 0.5–2.2)

## 2013-10-08 LAB — MRSA PCR SCREENING: MRSA by PCR: NEGATIVE

## 2013-10-08 MED ORDER — PANTOPRAZOLE SODIUM 40 MG PO TBEC
40.0000 mg | DELAYED_RELEASE_TABLET | Freq: Every day | ORAL | Status: DC
  Administered 2013-10-08 – 2013-10-11 (×4): 40 mg via ORAL
  Filled 2013-10-08 (×4): qty 1

## 2013-10-08 MED ORDER — POTASSIUM CHLORIDE IN NACL 20-0.9 MEQ/L-% IV SOLN
INTRAVENOUS | Status: DC
  Administered 2013-10-08: 03:00:00 via INTRAVENOUS

## 2013-10-08 MED ORDER — ENOXAPARIN SODIUM 40 MG/0.4ML ~~LOC~~ SOLN
40.0000 mg | SUBCUTANEOUS | Status: DC
  Administered 2013-10-08 – 2013-10-10 (×3): 40 mg via SUBCUTANEOUS
  Filled 2013-10-08 (×3): qty 0.4

## 2013-10-08 MED ORDER — POLYETHYLENE GLYCOL 3350 17 G PO PACK
17.0000 g | PACK | Freq: Every day | ORAL | Status: DC | PRN
Start: 2013-10-08 — End: 2013-10-11

## 2013-10-08 MED ORDER — ONDANSETRON HCL 4 MG/2ML IJ SOLN: 4.0000 mg | Freq: Four times a day (QID) | INTRAMUSCULAR | Status: DC | PRN

## 2013-10-08 MED ORDER — GADOBENATE DIMEGLUMINE 529 MG/ML IV SOLN
17.0000 mL | Freq: Once | INTRAVENOUS | Status: AC | PRN
  Administered 2013-10-08: 17 mL via INTRAVENOUS

## 2013-10-08 MED ORDER — ALBUTEROL SULFATE (2.5 MG/3ML) 0.083% IN NEBU: 2.5000 mg | INHALATION_SOLUTION | RESPIRATORY_TRACT | Status: DC | PRN

## 2013-10-08 MED ORDER — MAGNESIUM SULFATE IN D5W 10-5 MG/ML-% IV SOLN
INTRAVENOUS | Status: AC
  Filled 2013-10-08: qty 100

## 2013-10-08 MED ORDER — ASPIRIN EC 81 MG PO TBEC
81.0000 mg | DELAYED_RELEASE_TABLET | Freq: Every day | ORAL | Status: DC
  Administered 2013-10-08 – 2013-10-11 (×4): 81 mg via ORAL
  Filled 2013-10-08 (×4): qty 1

## 2013-10-08 MED ORDER — OXYCODONE HCL 5 MG PO TABS
5.0000 mg | ORAL_TABLET | ORAL | Status: DC | PRN
  Administered 2013-10-08 – 2013-10-10 (×9): 5 mg via ORAL
  Filled 2013-10-08 (×9): qty 1

## 2013-10-08 MED ORDER — THIAMINE HCL 100 MG/ML IJ SOLN
100.0000 mg | Freq: Every day | INTRAMUSCULAR | Status: DC
  Administered 2013-10-08 – 2013-10-10 (×3): 100 mg via INTRAVENOUS
  Filled 2013-10-08 (×3): qty 2

## 2013-10-08 MED ORDER — ONDANSETRON HCL 4 MG PO TABS: 4.0000 mg | ORAL_TABLET | Freq: Four times a day (QID) | ORAL | Status: DC | PRN

## 2013-10-08 MED ORDER — GABAPENTIN 300 MG PO CAPS
300.0000 mg | ORAL_CAPSULE | Freq: Two times a day (BID) | ORAL | Status: DC
  Administered 2013-10-08 – 2013-10-11 (×8): 300 mg via ORAL
  Filled 2013-10-08 (×8): qty 1

## 2013-10-08 MED ORDER — ACETAMINOPHEN 325 MG PO TABS
650.0000 mg | ORAL_TABLET | Freq: Four times a day (QID) | ORAL | Status: DC | PRN
  Administered 2013-10-10: 650 mg via ORAL
  Filled 2013-10-08: qty 2

## 2013-10-08 MED ORDER — ACETAMINOPHEN 650 MG RE SUPP: 650.0000 mg | Freq: Four times a day (QID) | RECTAL | Status: DC | PRN

## 2013-10-08 MED ORDER — LORAZEPAM 2 MG/ML IJ SOLN
1.0000 mg | INTRAMUSCULAR | Status: DC | PRN
  Administered 2013-10-08: 1 mg via INTRAVENOUS
  Filled 2013-10-08: qty 1

## 2013-10-08 MED ORDER — TAMOXIFEN CITRATE 10 MG PO TABS
20.0000 mg | ORAL_TABLET | Freq: Every day | ORAL | Status: DC
  Administered 2013-10-08 – 2013-10-11 (×4): 20 mg via ORAL
  Filled 2013-10-08 (×4): qty 2

## 2013-10-08 MED ORDER — SODIUM CHLORIDE 0.9 % IV SOLN
INTRAVENOUS | Status: DC
  Administered 2013-10-08: 10:00:00 via INTRAVENOUS

## 2013-10-08 MED ORDER — MORPHINE SULFATE 2 MG/ML IJ SOLN
1.0000 mg | INTRAMUSCULAR | Status: DC | PRN
  Administered 2013-10-08: 1 mg via INTRAVENOUS
  Filled 2013-10-08: qty 1

## 2013-10-08 MED ORDER — POTASSIUM CHLORIDE 10 MEQ/100ML IV SOLN
10.0000 meq | INTRAVENOUS | Status: AC
  Administered 2013-10-08 (×3): 10 meq via INTRAVENOUS
  Filled 2013-10-08 (×4): qty 100

## 2013-10-08 MED ORDER — MAGNESIUM SULFATE IN D5W 10-5 MG/ML-% IV SOLN
1.0000 g | Freq: Once | INTRAVENOUS | Status: AC
  Administered 2013-10-08: 1 g via INTRAVENOUS
  Filled 2013-10-08: qty 100

## 2013-10-08 MED ORDER — DOCUSATE SODIUM 100 MG PO CAPS
100.0000 mg | ORAL_CAPSULE | Freq: Two times a day (BID) | ORAL | Status: DC
Start: 2013-10-08 — End: 2013-10-11
  Administered 2013-10-08 – 2013-10-11 (×7): 100 mg via ORAL
  Filled 2013-10-08 (×7): qty 1

## 2013-10-08 MED ORDER — PNEUMOCOCCAL VAC POLYVALENT 25 MCG/0.5ML IJ INJ
0.5000 mL | INJECTION | INTRAMUSCULAR | Status: AC
Start: 2013-10-09 — End: 2013-10-09
  Administered 2013-10-09: 0.5 mL via INTRAMUSCULAR
  Filled 2013-10-08: qty 0.5

## 2013-10-08 MED ORDER — SODIUM CHLORIDE 0.9 % IJ SOLN
3.0000 mL | Freq: Two times a day (BID) | INTRAMUSCULAR | Status: DC
  Administered 2013-10-08 – 2013-10-10 (×5): 3 mL via INTRAVENOUS

## 2013-10-08 MED ORDER — POTASSIUM CHLORIDE CRYS ER 20 MEQ PO TBCR
40.0000 meq | EXTENDED_RELEASE_TABLET | Freq: Once | ORAL | Status: AC
  Administered 2013-10-08: 40 meq via ORAL
  Filled 2013-10-08: qty 2

## 2013-10-08 NOTE — Progress Notes (Signed)
EEG Completed; Results Pending  

## 2013-10-08 NOTE — Care Management Note (Addendum)
    Page 1 of 2   10/11/2013     12:44:59 PM   CARE MANAGEMENT NOTE 10/11/2013  Patient:  Elizabeth Orr, Elizabeth Orr   Account Number:  [id number]  Date Initiated:  10/08/2013  Documentation initiated by:  Theophilus Kinds  Subjective/Objective Assessment:   PT admitted from home with new onset seizures. Pt lives with her husband and is active with [facility] RN and PT. P thas hospital bed,, lift, w/c, and BSC for home use.     Action/Plan:   Pts husband would like to pursue private pay SNF. CSW is aware and will discuss with pts husband. If not pt will return home with resumption of [facility].   Anticipated DC Date:  10/12/2013   Anticipated DC Plan:  [facility] referral  Clinical Social Worker      DC Planning Services  CM consult      [facility] Choice  Resumption Of Svcs/PTA Provider   Choice offered to / List presented to:  C-3 Spouse        HH arranged  HH-1 Therapist, sports      [place].   Status of service:  Completed, signed off Medicare Important Message given?   (If response is "NO", the following Medicare IM given date fields will be blank) Date Medicare IM given:   Date Additional Medicare IM given:    Discharge Disposition:  [place]  Per UR Regulation:    If discussed at Long Length of Stay Meetings, dates discussed:    Comments:  10/11/13 [place], RN BSN CM Pt discharged home today with resumption of [facility] RN (pts husband chooses to stay with [facility]). Romualdo Bolk of [facility] is aware and will collect the pts information from the chart. Hh services to resume within 48 hours of discharge. Pt will not receive HH PT as Medicaid has denied this service. No other CM needs noted. Pts husband and pts nurse aware of discharge arrangements.  10/08/13 [place], RN BSN CM

## 2013-10-08 NOTE — ED Notes (Signed)
Pt placed on bedpan
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
## 2013-10-08 NOTE — Progress Notes (Signed)
UR chart review completed.  

## 2013-10-08 NOTE — Clinical Social Work Psychosocial (Signed)
Clinical Social Work Department BRIEF PSYCHOSOCIAL ASSESSMENT 10/08/2013  Patient:  Elizabeth Orr, Elizabeth Orr     Account Number:  [id number]     Admit date:  10/07/2013  Clinical Social Worker:  Wyatt Haste  Date/Time:  10/08/2013 03:59 PM  Referred by:  Care Management  Date Referred:  10/08/2013 Referred for  SNF Placement   Other Referral:   Interview type:  Family Other interview type:   spouse- Louis    PSYCHOSOCIAL DATA Living Status:  HUSBAND Admitted from facility:   Level of care:   Primary support name:  Louis Primary support relationship to patient:  SPOUSE Degree of support available:   supportive    CURRENT CONCERNS Current Concerns  Post-Acute Placement   Other Concerns:    SOCIAL WORK ASSESSMENT / PLAN CSW met with pt's husband at bedside. Pt having EEG. Pt well known to CSW from previous admissions. Several weeks ago she transferred to [facility] and husband took her out less than a week later because he wasn't pleased with care. She had a short admission after that and then returned home with his full time care assist. Husband reports this has been going relatively well. He is tired but managing. Pt admitted this time due to new onset seizures. He is very nervous about taking her back home with possibility of other seizures. CSW discussed private pay SNF. He reported to CM that he could pay privately, but would prefer payment plan. CSW spoke with both [facility] and [facility] at his request and both require one month's payment up front. He is aware that this is between $6,000 and $7,000. He has decided to wait and see what happens and does not want CSW to initiate bed search. He is aware if pt is d/c over weekend, she will need to return home. If pt remains in hospital until Monday, CSW will follow up. CSW called DSS last hospitalization and she has breast and cervical Medicaid. He is aware that this does not cover SNF. Pt's husband states even if it did, he  would pay privately in order to keep her closer to home.   Assessment/plan status:  Psychosocial Support/Ongoing Assessment of Needs Other assessment/ plan:   Information/referral to community resources:   SNF list    PATIENT'S/FAMILY'S RESPONSE TO PLAN OF CARE: Pt unable to discuss plan of care at this time. Husband will consider options this weekend. Aware that pt will still be private pay next week as well at SNF. SNF list provided. Will follow up Monday if pt remains in hospital.       Elizabeth Orr, [place]

## 2013-10-08 NOTE — Progress Notes (Signed)
CRITICAL VALUE ALERT  Critical value received:  Potassium 2.4  Date of notification:  10/08/13  Time of notification:  0819  Critical value read back:yes  Nurse who received alert:  L.Covington,RN  MD notified (1st page):  Sarajane Jews  Time of first page:  0822  MD notified (2nd page):  Time of second page:  Responding MD:  Sarajane Jews  Time MD responded:  0824-MD stated he would write new orders
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
## 2013-10-09 DIAGNOSIS — K7689 Other specified diseases of liver: Secondary | ICD-10-CM

## 2013-10-09 DIAGNOSIS — R740 Nonspecific elevation of levels of transaminase and lactic acid dehydrogenase [LDH]: Secondary | ICD-10-CM

## 2013-10-09 DIAGNOSIS — R7402 Elevation of levels of lactic acid dehydrogenase (LDH): Secondary | ICD-10-CM

## 2013-10-09 MED ORDER — LORAZEPAM 2 MG/ML IJ SOLN: 1.0000 mg | Freq: Four times a day (QID) | INTRAMUSCULAR | Status: DC | PRN

## 2013-10-09 NOTE — Progress Notes (Signed)
Report called to [facility]. Patient transferred to 324 in stable condition via bed with husband at bedside.
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
## 2013-10-10 DIAGNOSIS — K7030 Alcoholic cirrhosis of liver without ascites: Secondary | ICD-10-CM

## 2013-10-10 DIAGNOSIS — G9340 Encephalopathy, unspecified: Secondary | ICD-10-CM

## 2013-10-10 MED ORDER — DIPHENHYDRAMINE HCL 25 MG PO CAPS
25.0000 mg | ORAL_CAPSULE | Freq: Once | ORAL | Status: AC
  Administered 2013-10-10: 25 mg via ORAL
  Filled 2013-10-10: qty 1

## 2013-10-10 MED ORDER — OXYCODONE HCL 5 MG PO TABS
5.0000 mg | ORAL_TABLET | Freq: Three times a day (TID) | ORAL | Status: DC | PRN
  Administered 2013-10-10 – 2013-10-11 (×2): 5 mg via ORAL
  Filled 2013-10-10 (×2): qty 1

## 2013-10-10 MED ORDER — DIPHENHYDRAMINE HCL 25 MG PO CAPS
25.0000 mg | ORAL_CAPSULE | Freq: Four times a day (QID) | ORAL | Status: DC | PRN
  Administered 2013-10-10 – 2013-10-11 (×3): 25 mg via ORAL
  Filled 2013-10-10 (×3): qty 1
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
## 2013-10-10 NOTE — Plan of Care (Signed)
Pt lost IV access. Dr. informed

## 2013-10-11 MED ORDER — OXYCODONE-ACETAMINOPHEN 5-325 MG PO TABS: 1.0000 | ORAL_TABLET | Freq: Two times a day (BID) | ORAL | Status: AC | PRN

## 2013-10-11 MED ORDER — ENOXAPARIN SODIUM 40 MG/0.4ML ~~LOC~~ SOLN: 40.0000 mg | SUBCUTANEOUS | Status: DC

## 2013-10-11 NOTE — Progress Notes (Signed)
UR chart review completed.  
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
## 2013-10-11 NOTE — Clinical Social Work Note (Signed)
Pt d/c today. CSW spoke briefly with pt's husband on phone. He plans to be at hospital later this afternoon and is aware pt has been d/c. He states that plan is to take pt home and resume home health. CM notified.  Benay Pike, [place]

## 2013-10-11 NOTE — Progress Notes (Signed)
Patient ID: Elizabeth Orr, female   DOB: 1961-02-08, 53 y.o.   MRN: [id number]  [facility] A. Merlene Laughter, MD     [email or web address]          Elizabeth Orr is an 53 y.o. female.   Assessment/Plan: 1. Nonepileptic seizure spells. There is no need for epileptic medications. 2. Focal shaking/tremor is of unclear etiology. 3. Chronic pain syndrome. 4. Likely significant underlying anxiety and depression.  The patient reports that she is feeling a lot better. She still complains of feeling cold at times. She reports that her headaches, generalized pain and spells of shaking or a lot better. She has not had a shaking spell recently.  She is awake and alert. She is mostly coherent and lucid. Pupils are reactive and visual fields are intact. Facial muscle strength is symmetric. She has good strength bilaterally.  Objective: Vital signs in last 24 hours: Temp:  [98.4 F (36.9 C)-98.8 F (37.1 C)] 98.8 F (37.1 C) (02/23 4098) Pulse Rate:  [102-129] 121 (02/23 0613) Resp:  [18-20] 18 (02/23 0613) BP: (128-131)/(68-88) 131/68 mmHg (02/23 0613) SpO2:  [97 %-99 %] 99 % (02/23 0613)  Intake/Output from previous day: 02/22 0701 - 02/23 0700 In: 240 [P.O.:240] Out: -  Intake/Output this shift:   Nutritional status: Dysphagia   Lab Results: No results found for this or any previous visit (from the past 48 hour(s)).  Lipid Panel No results found for this basename: CHOL, TRIG, HDL, CHOLHDL, VLDL, LDLCALC,  in the last 72 hours  Studies/Results: No results found.  Medications:  Scheduled Meds: . aspirin EC  81 mg Oral Daily  . docusate sodium  100 mg Oral BID  . enoxaparin  40 mg Subcutaneous Q24H  . gabapentin  300 mg Oral BID  . pantoprazole  40 mg Oral Daily  . sodium chloride  3 mL Intravenous Q12H  . tamoxifen  20 mg Oral Daily   Continuous Infusions:  PRN Meds:.acetaminophen, acetaminophen, albuterol, diphenhydrAMINE, ondansetron (ZOFRAN) IV,  ondansetron, oxyCODONE, polyethylene glycol     LOS: 4 days   Elizabeth Orr A. Merlene Laughter, M.D.  Diplomate, Tax adviser of Psychiatry and Neurology ( Neurology).

## 2013-10-11 NOTE — Progress Notes (Signed)
PROGRESS NOTE  DAZANI NORBY SWN:[id number] DOB: 05-Jun-1961 DOA: 10/07/2013 PCP: Dionisio Paschal, NP  Summary: 53 year old woman presented with new onset seizure.  Assessment/Plan: 1. Possible new-onset seizure. No recurrence. No focal deficits. MRI brain unremarkable. Possible alcohol or narcotic withdrawal. Followup EEG. No antiepileptics per neurology at this point. 2. Subacute encephalopathy. Resolved. 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 Husband reports the patient has been confused, calling out for dead relatives for at least the last 10 days prior to admission. She was also noted to be confused in the hospital last time. Ammonia level was normal. Urinalysis and chest x-ray unremarkable. CT of the abdomen and pelvis unremarkable. No signs of infection.  3. Possible RUQ pain. Resolved. Chronic elevation of AST, alkaline phosphatase. Abdominal ultrasound was unremarkable for acute process.  4. Severe hypokalemia. Resolved with repletion. 5. Dehydration. Resolved. 6. Possible narcotic withdrawal. Stable. 7. Bimalleolar fracture left ankle status post ORIF 1/28. Continue prophylactic Lovenox. Toe-touch weightbearing only on the left side. Cast to remain in place until followup with orthopedics in approximately 2 weeks. 8. Right knee laceration from fall in skilled nursing facility. Appears to be healing well.  9. History of breast cancer. 10. Morbid obesity 11. Dysphagia 3 diet with thin liquids. Tolerating diet. History of esophageal dilatation. 12. History of alcoholism. Husband reports no alcohol in one month. 13. Cirrhosis/steatosis. Thought to be alcohol induced. Aldactone was started last admission. Virus hepatitis panel negative. 14. Chronic pain. Stable. Husband wonders whether her pain medication is causing some of her confusion. 15. Severe hypo-albuminemia    No recurrent  seizures. Followup EEG results but suspect main issues are stress, anxiety, depression.   Suggest wean narcotics as an outpatient  Out of bed with assistance, toe-touch weightbearing only left side  Outpatient evaluation for depression suggested  Outpatient followup for knee laceration and suture removal.  Discharge home with home health RN, PT  Code Status: full code DVT prophylaxis: Lovenox Family Communication: as above Disposition Plan:   Murray Hodgkins, MD  [facility]  Pager [phone] If 7PM-7AM, please contact night-coverage at [email or web address], password [facility] 10/11/2013, 12:16 PM  LOS: 4 days   Consultants:  Neurology  Procedures:  EEG done, interpretation pending  Antibiotics:    HPI/Subjective: Depressed. She reports a lot of stress at home with her family. No issues overnight per nursing.  Objective: Filed Vitals:   10/09/13 2129 10/10/13 1434 10/10/13 2148 10/11/13 0613  BP: 134/84 128/77 128/88 131/68  Pulse: 128 102 129 121  Temp: 98.4 F (36.9 C) 98.4 F (36.9 C) 98.6 F (37 C) 98.8 F (37.1 C)  TempSrc: Oral Oral Oral Oral  Resp: 20 20 20 18   Height:      Weight:      SpO2: 99% 97% 99% 99%    Intake/Output Summary (Last 24 hours) at 10/11/13 1216 Last data filed at 10/11/13 0900  Gross per 24 hour  Intake    480 ml  Output      0 ml  Net    480 ml     Filed Weights   10/07/13 2022 10/08/13 0213 10/09/13 0600  Weight: 100.699 kg (222 lb) 84.9 kg (187 lb 2.7 oz) 87 kg (191 lb 12.8 oz)    Exam:   Afebrile, vital signs stable. No hypoxia.  Gen. Spontaneously breaks out into tears at times but overall appears much better. Speech is fluent and clear. She appears depressed and anxious.  Cardiovascular regular rhythm,  tachycardic. No murmur, rub or gallop. No lower extremity edema.  Respiratory. Clear to auscultation bilaterally. No wheezes, rales or rhonchi. Normal respiratory effort.  Abdomen. Soft, nontender,  nondistended.  Data Reviewed:  CT head no acute disease  CT abdomen and pelvis severe diffuse fatty liver infiltration. No biliary ductal dilatation.  MRI of the brain unremarkable.  Scheduled Meds: . aspirin EC  81 mg Oral Daily  . docusate sodium  100 mg Oral BID  . enoxaparin  40 mg Subcutaneous Q24H  . gabapentin  300 mg Oral BID  . pantoprazole  40 mg Oral Daily  . sodium chloride  3 mL Intravenous Q12H  . tamoxifen  20 mg Oral Daily   Continuous Infusions:    Principal Problem:   New onset seizure Active Problems:   Infiltrating ductal carcinoma of breast   Hepatic steatosis   Alcoholism   Hypokalemia   Alcoholic cirrhosis of liver   Possible Narcotic withdrawal   Abdominal pain   Acute encephalopathy   Encephalopathy

## 2013-10-11 NOTE — Procedures (Signed)
  [place] A. Merlene Laughter, MD     [email or web address]           HISTORY: This is a 53 year old lady who presents with recurrent spells of loss of consciousness and shaking suspicious for seizure activity.  MEDICATIONS: Scheduled Meds: . aspirin EC  81 mg Oral Daily  . docusate sodium  100 mg Oral BID  . enoxaparin  40 mg Subcutaneous Q24H  . gabapentin  300 mg Oral BID  . pantoprazole  40 mg Oral Daily  . sodium chloride  3 mL Intravenous Q12H  . tamoxifen  20 mg Oral Daily   Continuous Infusions:  PRN Meds:.acetaminophen, acetaminophen, albuterol, diphenhydrAMINE, ondansetron (ZOFRAN) IV, ondansetron, oxyCODONE, polyethylene glycol   ANALYSIS: 16 channel recording using standard 10 20 measurements approximately 20 minutes. There is a background activity consists high as 8 Hz bilaterally. Beta activity is observed in the frontal areas. Awake and drowsy activities are recorded. Photic stiimulation carried out without abnormal changes in the background activity. The patient was noted to have what the family described as atypical seizure spell with shaking on both sides. The tech noted that the patient was not postictal responded during the spell and after the spell. Electrographically, myogenic artifacts are seen but no evidence of electrographic seizures. She also had a couple of other shaking spells which did not correlate with any electrographic changes. There are no focal slowing or lateralized slowing. There are no epileptiform activities observed.    IMPRESSION: This recording shows mild generalized slowing but otherwise unremarkable. The generalized shaking spell was not correlated with electrographic seizures. Also, there are no electrographic correlates with any of her shaking spells.      Elizabeth Orr A. Merlene Laughter, M.D.  Diplomate, Tax adviser of Psychiatry and Neurology ( Neurology).

## 2013-10-12 ENCOUNTER — Telehealth: Payer: Self-pay | Admitting: Family Medicine

## 2013-10-12 NOTE — Telephone Encounter (Signed)
EEG resulted.  IMPRESSION:  This recording shows mild generalized slowing but otherwise unremarkable. The generalized shaking spell was not correlated with electrographic seizures. Also, there are no electrographic correlates with any of her shaking spells.  This correlates with clinical impression as documented in discharge summary. No further evaluation suggested.  Murray Hodgkins, MD [facility]

## 2013-10-21 ENCOUNTER — Encounter: Payer: Self-pay | Admitting: Internal Medicine

## 2013-10-26 ENCOUNTER — Telehealth (HOSPITAL_COMMUNITY): Payer: Self-pay | Admitting: Oncology

## 2013-10-26 NOTE — Telephone Encounter (Signed)
RCVD REFERRAL FROM [facility] FOR THIS PT. I CALLED TO INFORM THEM THAT PT WAS ALREADY AND EST PT HERE AND ?'D THE REFERRAL REASON. [place] WITH ANGELA SHE REALLY DIDN'T GIVE ME A CLEAR REASON AS TO WHY A REFERRAL WAS SENT  TO [place]. I ADVISED ANGELA THAT I WOULD CALL THE PT AND SET UP AN APPT FOR HER. PC TO [phone] THERE WAS NO ANSWER AND  VM WAS NOT SET UP. WILL TRY THE CALL AGAIN LATER TODAY. [facility] [phone]

## 2013-10-28 ENCOUNTER — Encounter (HOSPITAL_COMMUNITY): Payer: Self-pay | Admitting: Oncology

## 2013-10-28 ENCOUNTER — Encounter (HOSPITAL_COMMUNITY): Payer: Medicaid Other | Attending: Oncology | Admitting: Oncology

## 2013-10-28 ENCOUNTER — Encounter (HOSPITAL_BASED_OUTPATIENT_CLINIC_OR_DEPARTMENT_OTHER): Payer: Medicaid Other

## 2013-10-28 VITALS — BP 134/88 | HR 120 | Temp 98.0°F | Resp 20

## 2013-10-28 DIAGNOSIS — C773 Secondary and unspecified malignant neoplasm of axilla and upper limb lymph nodes: Secondary | ICD-10-CM

## 2013-10-28 DIAGNOSIS — C50419 Malignant neoplasm of upper-outer quadrant of unspecified female breast: Secondary | ICD-10-CM

## 2013-10-28 DIAGNOSIS — Z0000 Encounter for general adult medical examination without abnormal findings: Secondary | ICD-10-CM

## 2013-10-28 DIAGNOSIS — C50919 Malignant neoplasm of unspecified site of unspecified female breast: Secondary | ICD-10-CM

## 2013-10-28 DIAGNOSIS — F3289 Other specified depressive episodes: Secondary | ICD-10-CM | POA: Insufficient documentation

## 2013-10-28 DIAGNOSIS — K219 Gastro-esophageal reflux disease without esophagitis: Secondary | ICD-10-CM | POA: Insufficient documentation

## 2013-10-28 DIAGNOSIS — Z923 Personal history of irradiation: Secondary | ICD-10-CM | POA: Insufficient documentation

## 2013-10-28 DIAGNOSIS — F329 Major depressive disorder, single episode, unspecified: Secondary | ICD-10-CM | POA: Insufficient documentation

## 2013-10-28 DIAGNOSIS — Z9010 Acquired absence of unspecified breast and nipple: Secondary | ICD-10-CM | POA: Insufficient documentation

## 2013-10-28 DIAGNOSIS — R4182 Altered mental status, unspecified: Secondary | ICD-10-CM | POA: Insufficient documentation

## 2013-10-28 DIAGNOSIS — F411 Generalized anxiety disorder: Secondary | ICD-10-CM | POA: Insufficient documentation

## 2013-10-28 DIAGNOSIS — Z79810 Long term (current) use of selective estrogen receptor modulators (SERMs): Secondary | ICD-10-CM | POA: Insufficient documentation

## 2013-10-28 DIAGNOSIS — Z91199 Patient's noncompliance with other medical treatment and regimen due to unspecified reason: Secondary | ICD-10-CM | POA: Insufficient documentation

## 2013-10-28 DIAGNOSIS — Z09 Encounter for follow-up examination after completed treatment for conditions other than malignant neoplasm: Secondary | ICD-10-CM | POA: Insufficient documentation

## 2013-10-28 DIAGNOSIS — Z9119 Patient's noncompliance with other medical treatment and regimen: Secondary | ICD-10-CM | POA: Insufficient documentation

## 2013-10-28 DIAGNOSIS — Z170 Estrogen receptor positive status [ER+]: Secondary | ICD-10-CM

## 2013-10-28 DIAGNOSIS — D509 Iron deficiency anemia, unspecified: Secondary | ICD-10-CM | POA: Insufficient documentation

## 2013-10-28 DIAGNOSIS — Z853 Personal history of malignant neoplasm of breast: Secondary | ICD-10-CM | POA: Insufficient documentation

## 2013-10-28 DIAGNOSIS — F1020 Alcohol dependence, uncomplicated: Secondary | ICD-10-CM | POA: Insufficient documentation

## 2013-10-28 LAB — COMPREHENSIVE METABOLIC PANEL
ALT: 16 U/L (ref 0–35)
AST: 31 U/L (ref 0–37)
Albumin: 1.8 g/dL — ABNORMAL LOW (ref 3.5–5.2)
Alkaline Phosphatase: 262 U/L — ABNORMAL HIGH (ref 39–117)
BUN: 6 mg/dL (ref 6–23)
CO2: 23 mEq/L (ref 19–32)
CREATININE: 0.63 mg/dL (ref 0.50–1.10)
Calcium: 9 mg/dL (ref 8.4–10.5)
Chloride: 93 mEq/L — ABNORMAL LOW (ref 96–112)
GFR calc Af Amer: >90 mL/min (ref >90)
GFR calc non Af Amer: >90 mL/min (ref >90)
Glucose, Bld: 97 mg/dL (ref 70–99)
Potassium: 4.3 mEq/L (ref 3.7–5.3)
Sodium: 132 mEq/L — ABNORMAL LOW (ref 137–147)
TOTAL PROTEIN: 6.8 g/dL (ref 6.0–8.3)
Total Bilirubin: 3 mg/dL — ABNORMAL HIGH (ref 0.3–1.2)

## 2013-10-28 LAB — CBC WITH DIFFERENTIAL/PLATELET
BASOS PCT: 0 % (ref 0–1)
Basophils Absolute: 0 10*3/uL (ref 0.0–0.1)
EOS ABS: 0 10*3/uL (ref 0.0–0.7)
EOS PCT: 0 % (ref 0–5)
HEMATOCRIT: 40.8 % (ref 36.0–46.0)
Hemoglobin: 12.8 g/dL (ref 12.0–15.0)
Lymphocytes Relative: 17 % (ref 12–46)
Lymphs Abs: 2 10*3/uL (ref 0.7–4.0)
MCH: 35.6 pg — ABNORMAL HIGH (ref 26.0–34.0)
MCHC: 31.4 g/dL (ref 30.0–36.0)
MCV: 113.3 fL — AB (ref 78.0–100.0)
MONO ABS: 0.6 10*3/uL (ref 0.1–1.0)
Monocytes Relative: 5 % (ref 3–12)
Neutro Abs: 9.1 10*3/uL — ABNORMAL HIGH (ref 1.7–7.7)
Neutrophils Relative %: 77 % (ref 43–77)
Platelets: 203 10*3/uL (ref 150–400)
RBC: 3.6 MIL/uL — ABNORMAL LOW (ref 3.87–5.11)
RDW: 14.9 % (ref 11.5–15.5)
WBC: 11.7 10*3/uL — ABNORMAL HIGH (ref 4.0–10.5)

## 2013-10-28 LAB — FERRITIN: FERRITIN: 223 ng/mL (ref 10–291)

## 2013-10-28 MED ORDER — ANASTROZOLE 1 MG PO TABS: 1.0000 mg | ORAL_TABLET | Freq: Every day | ORAL | Status: DC

## 2013-10-28 NOTE — Patient Instructions (Signed)
[place] [facility] [place] Discharge Instructions  RECOMMENDATIONS MADE BY THE CONSULTANT AND ANY TEST RESULTS WILL BE SENT TO YOUR REFERRING PHYSICIAN.  MEDICATIONS PRESCRIBED:  Arimidex daily.  This is your "cancer pill."  If you have a medication called "Tamoxifen" at home, please do not take as the new medication takes the place of this medication.  INSTRUCTIONS GIVEN AND DISCUSSED: Defer pain management to other specialties/PCP  SPECIAL INSTRUCTIONS/FOLLOW-UP: Labs today. We will schedule a bone density examination for you in the future.  Return in 3 months for follow-up.  Thank you for choosing [facility] to provide your oncology and hematology care.  To afford each patient quality time with our providers, please arrive at least 15 minutes before your scheduled appointment time.  With your help, our goal is to use those 15 minutes to complete the necessary work-up to ensure our physicians have the information they need to help with your evaluation and healthcare recommendations.    Effective January 1st, 2014, we ask that you re-schedule your appointment with our physicians should you arrive 10 or more minutes late for your appointment.  We strive to give you quality time with our providers, and arriving late affects you and other patients whose appointments are after yours.    Again, thank you for choosing [facility].  Our hope is that these requests will decrease the amount of time that you wait before being seen by our physicians.       _____________________________________________________________  Should you have questions after your visit to [facility], please contact our office at [phone] between the hours of 8:30 a.m. and 5:00 p.m.  Voicemails left after 4:30 p.m. will not be returned until the following business day.  For prescription refill requests, have your pharmacy contact our office with your prescription refill  request.
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
## 2013-10-28 NOTE — Progress Notes (Signed)
Labs drawn today for cbc/diff,cmp,ferr,cea,[id number]

## 2013-10-29 ENCOUNTER — Other Ambulatory Visit (HOSPITAL_COMMUNITY): Payer: Self-pay

## 2013-10-29 DIAGNOSIS — C50919 Malignant neoplasm of unspecified site of unspecified female breast: Secondary | ICD-10-CM

## 2013-10-29 DIAGNOSIS — D649 Anemia, unspecified: Secondary | ICD-10-CM

## 2013-10-29 LAB — CEA: CEA: 4.3 ng/mL (ref 0.0–5.0)

## 2013-10-29 LAB — CANCER ANTIGEN 27.29: CA 27.29: 55 U/mL — AB (ref 0–39)

## 2013-10-31 ENCOUNTER — Emergency Department (HOSPITAL_COMMUNITY): Payer: Medicaid Other

## 2013-10-31 ENCOUNTER — Other Ambulatory Visit: Payer: Self-pay

## 2013-10-31 ENCOUNTER — Inpatient Hospital Stay (HOSPITAL_COMMUNITY)
Admission: EM | Admit: 2013-10-31 | Discharge: 2013-11-04 | DRG: 377 | Disposition: A | Discharge status: Home Health | Payer: Medicaid Other | Attending: Family Medicine | Admitting: Family Medicine

## 2013-10-31 ENCOUNTER — Encounter (HOSPITAL_COMMUNITY): Payer: Self-pay | Admitting: Emergency Medicine

## 2013-10-31 DIAGNOSIS — S82843A Displaced bimalleolar fracture of unspecified lower leg, initial encounter for closed fracture: Secondary | ICD-10-CM

## 2013-10-31 DIAGNOSIS — Z7982 Long term (current) use of aspirin: Secondary | ICD-10-CM

## 2013-10-31 DIAGNOSIS — Z853 Personal history of malignant neoplasm of breast: Secondary | ICD-10-CM

## 2013-10-31 DIAGNOSIS — E872 Acidosis, unspecified: Secondary | ICD-10-CM | POA: Diagnosis present

## 2013-10-31 DIAGNOSIS — Z87442 Personal history of urinary calculi: Secondary | ICD-10-CM

## 2013-10-31 DIAGNOSIS — S82899A Other fracture of unspecified lower leg, initial encounter for closed fracture: Secondary | ICD-10-CM

## 2013-10-31 DIAGNOSIS — Z8711 Personal history of peptic ulcer disease: Secondary | ICD-10-CM

## 2013-10-31 DIAGNOSIS — IMO0001 Reserved for inherently not codable concepts without codable children: Secondary | ICD-10-CM

## 2013-10-31 DIAGNOSIS — Z66 Do not resuscitate: Secondary | ICD-10-CM | POA: Diagnosis present

## 2013-10-31 DIAGNOSIS — K219 Gastro-esophageal reflux disease without esophagitis: Secondary | ICD-10-CM | POA: Diagnosis present

## 2013-10-31 DIAGNOSIS — K922 Gastrointestinal hemorrhage, unspecified: Secondary | ICD-10-CM

## 2013-10-31 DIAGNOSIS — Z833 Family history of diabetes mellitus: Secondary | ICD-10-CM

## 2013-10-31 DIAGNOSIS — Z6836 Body mass index (BMI) 36.0-36.9, adult: Secondary | ICD-10-CM

## 2013-10-31 DIAGNOSIS — K449 Diaphragmatic hernia without obstruction or gangrene: Secondary | ICD-10-CM | POA: Diagnosis present

## 2013-10-31 DIAGNOSIS — F329 Major depressive disorder, single episode, unspecified: Secondary | ICD-10-CM | POA: Diagnosis present

## 2013-10-31 DIAGNOSIS — E8809 Other disorders of plasma-protein metabolism, not elsewhere classified: Secondary | ICD-10-CM | POA: Diagnosis present

## 2013-10-31 DIAGNOSIS — S72453A Displaced supracondylar fracture without intracondylar extension of lower end of unspecified femur, initial encounter for closed fracture: Secondary | ICD-10-CM

## 2013-10-31 DIAGNOSIS — G8929 Other chronic pain: Secondary | ICD-10-CM | POA: Diagnosis present

## 2013-10-31 DIAGNOSIS — G9340 Encephalopathy, unspecified: Secondary | ICD-10-CM | POA: Diagnosis present

## 2013-10-31 DIAGNOSIS — Z7401 Bed confinement status: Secondary | ICD-10-CM

## 2013-10-31 DIAGNOSIS — M549 Dorsalgia, unspecified: Secondary | ICD-10-CM | POA: Diagnosis present

## 2013-10-31 DIAGNOSIS — R4182 Altered mental status, unspecified: Secondary | ICD-10-CM

## 2013-10-31 DIAGNOSIS — K7460 Unspecified cirrhosis of liver: Secondary | ICD-10-CM

## 2013-10-31 DIAGNOSIS — R109 Unspecified abdominal pain: Secondary | ICD-10-CM | POA: Diagnosis present

## 2013-10-31 DIAGNOSIS — K7030 Alcoholic cirrhosis of liver without ascites: Secondary | ICD-10-CM | POA: Diagnosis present

## 2013-10-31 DIAGNOSIS — K760 Fatty (change of) liver, not elsewhere classified: Secondary | ICD-10-CM | POA: Diagnosis present

## 2013-10-31 DIAGNOSIS — F3289 Other specified depressive episodes: Secondary | ICD-10-CM | POA: Diagnosis present

## 2013-10-31 DIAGNOSIS — Z8249 Family history of ischemic heart disease and other diseases of the circulatory system: Secondary | ICD-10-CM

## 2013-10-31 DIAGNOSIS — Z79899 Other long term (current) drug therapy: Secondary | ICD-10-CM

## 2013-10-31 DIAGNOSIS — F411 Generalized anxiety disorder: Secondary | ICD-10-CM | POA: Diagnosis present

## 2013-10-31 DIAGNOSIS — IMO0002 Reserved for concepts with insufficient information to code with codable children: Secondary | ICD-10-CM

## 2013-10-31 DIAGNOSIS — D649 Anemia, unspecified: Secondary | ICD-10-CM | POA: Diagnosis present

## 2013-10-31 DIAGNOSIS — E876 Hypokalemia: Secondary | ICD-10-CM | POA: Diagnosis present

## 2013-10-31 DIAGNOSIS — K200 Eosinophilic esophagitis: Secondary | ICD-10-CM | POA: Diagnosis present

## 2013-10-31 DIAGNOSIS — F17200 Nicotine dependence, unspecified, uncomplicated: Secondary | ICD-10-CM | POA: Diagnosis present

## 2013-10-31 DIAGNOSIS — R7989 Other specified abnormal findings of blood chemistry: Secondary | ICD-10-CM | POA: Diagnosis present

## 2013-10-31 LAB — COMPREHENSIVE METABOLIC PANEL
ALT: 16 U/L (ref 0–35)
AST: 45 U/L — AB (ref 0–37)
Albumin: 1.7 g/dL — ABNORMAL LOW (ref 3.5–5.2)
Alkaline Phosphatase: 269 U/L — ABNORMAL HIGH (ref 39–117)
BILIRUBIN TOTAL: 2.6 mg/dL — AB (ref 0.3–1.2)
BUN: 6 mg/dL (ref 6–23)
CHLORIDE: 89 meq/L — AB (ref 96–112)
CO2: 28 mEq/L (ref 19–32)
Calcium: 8.7 mg/dL (ref 8.4–10.5)
Creatinine, Ser: 0.51 mg/dL (ref 0.50–1.10)
GFR calc Af Amer: >90 mL/min (ref >90)
GFR calc non Af Amer: >90 mL/min (ref >90)
Glucose, Bld: 108 mg/dL — ABNORMAL HIGH (ref 70–99)
POTASSIUM: 2.9 meq/L — AB (ref 3.7–5.3)
Sodium: 133 mEq/L — ABNORMAL LOW (ref 137–147)
Total Protein: 6.5 g/dL (ref 6.0–8.3)

## 2013-10-31 LAB — URINALYSIS, ROUTINE W REFLEX MICROSCOPIC
GLUCOSE, UA: 100 mg/dL — AB
HGB URINE DIPSTICK: NEGATIVE
Leukocytes, UA: NEGATIVE
Nitrite: POSITIVE — AB
PROTEIN: 30 mg/dL — AB
Specific Gravity, Urine: 1.025 (ref 1.005–1.030)
UROBILINOGEN UA: 4 mg/dL — AB (ref 0.0–1.0)
pH: 5.5 (ref 5.0–8.0)

## 2013-10-31 LAB — AMMONIA: Ammonia: 37 umol/L (ref 11–60)

## 2013-10-31 LAB — CBC WITH DIFFERENTIAL/PLATELET
Basophils Absolute: 0 10*3/uL (ref 0.0–0.1)
Basophils Relative: 0 % (ref 0–1)
Eosinophils Absolute: 0 10*3/uL (ref 0.0–0.7)
Eosinophils Relative: 1 % (ref 0–5)
HCT: 36.9 % (ref 36.0–46.0)
HEMOGLOBIN: 12.3 g/dL (ref 12.0–15.0)
Lymphocytes Relative: 20 % (ref 12–46)
Lymphs Abs: 1.8 10*3/uL (ref 0.7–4.0)
MCH: 36.1 pg — ABNORMAL HIGH (ref 26.0–34.0)
MCHC: 33.3 g/dL (ref 30.0–36.0)
MCV: 108.2 fL — ABNORMAL HIGH (ref 78.0–100.0)
MONOS PCT: 7 % (ref 3–12)
Monocytes Absolute: 0.6 10*3/uL (ref 0.1–1.0)
NEUTROS ABS: 6.2 10*3/uL (ref 1.7–7.7)
NEUTROS PCT: 72 % (ref 43–77)
PLATELETS: 172 10*3/uL (ref 150–400)
RBC: 3.41 MIL/uL — AB (ref 3.87–5.11)
RDW: 15.2 % (ref 11.5–15.5)
WBC: 8.6 10*3/uL (ref 4.0–10.5)

## 2013-10-31 LAB — TYPE AND SCREEN
ABO/RH(D): O POS
Antibody Screen: NEGATIVE

## 2013-10-31 LAB — URINE MICROSCOPIC-ADD ON

## 2013-10-31 LAB — CBG MONITORING, ED: GLUCOSE-CAPILLARY: 94 mg/dL (ref 70–99)

## 2013-10-31 LAB — MAGNESIUM: MAGNESIUM: 1.9 mg/dL (ref 1.5–2.5)

## 2013-10-31 LAB — PROTIME-INR
INR: 1.33 (ref 0.00–1.49)
Prothrombin Time: 16.2 seconds — ABNORMAL HIGH (ref 11.6–15.2)

## 2013-10-31 LAB — LACTIC ACID, PLASMA: LACTIC ACID, VENOUS: 6.4 mmol/L — AB (ref 0.5–2.2)

## 2013-10-31 MED ORDER — IOHEXOL 300 MG/ML  SOLN
50.0000 mL | Freq: Once | INTRAMUSCULAR | Status: AC | PRN
  Administered 2013-10-31: 50 mL via ORAL

## 2013-10-31 MED ORDER — ONDANSETRON HCL 4 MG PO TABS
4.0000 mg | ORAL_TABLET | Freq: Four times a day (QID) | ORAL | Status: DC | PRN
  Administered 2013-11-02: 4 mg via ORAL
  Filled 2013-10-31: qty 1

## 2013-10-31 MED ORDER — SODIUM CHLORIDE 0.9 % IV BOLUS (SEPSIS)
1000.0000 mL | Freq: Once | INTRAVENOUS | Status: AC
  Administered 2013-10-31: 1000 mL via INTRAVENOUS

## 2013-10-31 MED ORDER — MORPHINE SULFATE 2 MG/ML IJ SOLN
2.0000 mg | INTRAMUSCULAR | Status: DC | PRN
  Administered 2013-11-01 – 2013-11-03 (×11): 2 mg via INTRAVENOUS
  Filled 2013-10-31 (×11): qty 1

## 2013-10-31 MED ORDER — SODIUM CHLORIDE 0.9 % IJ SOLN
3.0000 mL | Freq: Two times a day (BID) | INTRAMUSCULAR | Status: DC
  Administered 2013-10-31: 3 mL via INTRAVENOUS
  Administered 2013-11-01: 10 mL via INTRAVENOUS
  Administered 2013-11-02 – 2013-11-04 (×4): 3 mL via INTRAVENOUS

## 2013-10-31 MED ORDER — HYDROMORPHONE HCL PF 1 MG/ML IJ SOLN
1.0000 mg | INTRAMUSCULAR | Status: AC | PRN
  Administered 2013-10-31 (×2): 1 mg via INTRAVENOUS
  Filled 2013-10-31 (×2): qty 1

## 2013-10-31 MED ORDER — POTASSIUM CHLORIDE IN NACL 40-0.9 MEQ/L-% IV SOLN
INTRAVENOUS | Status: DC
  Administered 2013-10-31 – 2013-11-02 (×3): via INTRAVENOUS

## 2013-10-31 MED ORDER — IOHEXOL 300 MG/ML  SOLN
100.0000 mL | Freq: Once | INTRAMUSCULAR | Status: AC | PRN
  Administered 2013-10-31: 100 mL via INTRAVENOUS

## 2013-10-31 MED ORDER — PROMETHAZINE HCL 25 MG/ML IJ SOLN
12.5000 mg | Freq: Once | INTRAMUSCULAR | Status: AC
  Administered 2013-10-31: 12.5 mg via INTRAVENOUS
  Filled 2013-10-31: qty 1

## 2013-10-31 MED ORDER — PROMETHAZINE HCL 25 MG/ML IJ SOLN
INTRAMUSCULAR | Status: AC
  Filled 2013-10-31: qty 1

## 2013-10-31 MED ORDER — PANTOPRAZOLE SODIUM 40 MG IV SOLR
40.0000 mg | Freq: Two times a day (BID) | INTRAVENOUS | Status: DC
  Administered 2013-10-31 – 2013-11-02 (×3): 40 mg via INTRAVENOUS
  Filled 2013-10-31 (×4): qty 40

## 2013-10-31 MED ORDER — ONDANSETRON HCL 4 MG/2ML IJ SOLN: 4.0000 mg | Freq: Four times a day (QID) | INTRAMUSCULAR | Status: DC | PRN

## 2013-10-31 MED ORDER — DEXTROSE 5 % IV SOLN
INTRAVENOUS | Status: AC
  Filled 2013-10-31: qty 10

## 2013-10-31 MED ORDER — DEXTROSE 5 % IV SOLN
1.0000 g | INTRAVENOUS | Status: DC
  Administered 2013-10-31 – 2013-11-01 (×2): 1 g via INTRAVENOUS
  Filled 2013-10-31 (×3): qty 10

## 2013-10-31 MED ORDER — ACETAMINOPHEN 325 MG PO TABS
650.0000 mg | ORAL_TABLET | Freq: Four times a day (QID) | ORAL | Status: DC | PRN
Start: 2013-10-31 — End: 2013-11-04
  Administered 2013-10-31 – 2013-11-04 (×6): 650 mg via ORAL
  Filled 2013-10-31 (×6): qty 2

## 2013-10-31 MED ORDER — POTASSIUM CHLORIDE 10 MEQ/100ML IV SOLN
10.0000 meq | INTRAVENOUS | Status: AC
  Administered 2013-10-31 (×3): 10 meq via INTRAVENOUS
  Filled 2013-10-31 (×2): qty 100

## 2013-10-31 MED ORDER — ACETAMINOPHEN 650 MG RE SUPP: 650.0000 mg | Freq: Four times a day (QID) | RECTAL | Status: DC | PRN

## 2013-10-31 NOTE — ED Notes (Signed)
Complain of n/v/d that started this morning. Pt screaming when being moved from EMS stretcher to bed. States her right leg is killing her. Stating for her moma to come and make it better.

## 2013-10-31 NOTE — ED Notes (Signed)
Pt taken to ct. Pt alert/oreinted to most. Calls people mommy. Loving to husband. Knee immobilizer placed.

## 2013-10-31 NOTE — ED Notes (Addendum)
Pt c/o pain in the legs and black tarry stools that began yesterday, per pt's husband. Pt broke her leg a month and a half ago and had surgery with screw placement. The cast was removed Feb 23 (per husband). Pt has h/o liver cirrhosis but quit drinking 2 months ago. Pt yelling with pain, rates 8/10 in the lower mid abdomen and left leg. There is notable bruising on both legs and the left leg is unable to be straightened without assistance. Pt yells when the leg is touched. Pt also stated "I have a fear of pain." MM are wet. No complaints of N/V.

## 2013-10-31 NOTE — ED Notes (Signed)
Report given to floor nurse at 1600.

## 2013-10-31 NOTE — ED Notes (Signed)
Per EMS, stated pt was having black tarry stools yesterday

## 2013-10-31 NOTE — ED Provider Notes (Addendum)
CSN: [id number]     Arrival date & time 10/31/13  1211 History   First MD Initiated Contact with Patient 10/31/13 1219     Chief Complaint  Patient presents with  . Abdominal Pain   The history is provided by the patient. No language interpreter was used.   HPI Comments: Elizabeth Orr is a 53 y.o. female who presents to the Emergency Department complaining of mid lower abdominal pain, "charcoal" like stool and pain of her legs. She has hx of liver cirrhosis and was on Lovenox shots recently. She had left [facility] for ankle fx on 09/15/13 and surgery of her right femur on 01/21/2013. She reports re injured her right thigh recently and still having pain of her left ankle. Last week her cast was removed at Dr. Ruthe Mannan office who performed her surgery. She has been home for x3 weeks since she was at rehab, she has also been having her potassium checked regularly, it has been at normal level recently but takes potassium supplements. She is unable to walk or stand on her own and uses a bedpan, she lives at home w/her husband who takes care of her as well a nurse comes once a week. She also reports a rash of her upper extremities which is new. She woke up twice last PM hollering in pain to her husband saying that her abdomen was bothering her. She takes percocet for her leg/thig pain. No frankly bloody stools.  Still getting lovenox daily.  No apparent falls, per husbands report.  She is bedbound, chronically. Liver Cirrhosis, quit drinking x2 months ago. History of chronic narcotic use.  Past Medical History  Diagnosis Date  . Kidney stones 1989  . GERD (gastroesophageal reflux disease)   . History of stomach ulcers 2003/2007    Hx of esophageal ulcers and stomach ulcers due to reflux  . Back pain   . Chronic leg pain   . Polysubstance abuse   . Depression   . Anxiety   . Exertional shortness of breath   . History of blood transfusion     "recently had my 2nd; related to chemo" (01/21/2013)  .  [id number]     "monthly" (01/21/2013)  . Hepatic steatosis 09/26/2013  . Anemia, iron deficiency 09/26/2013  . Macrocytosis 09/26/2013  . Alcoholism 09/27/2013    In recovery  . Alcoholic cirrhosis of liver 09/28/2013  . New onset seizure 10/08/2013  . Breast cancer 06/08/12    right mastectomy  . Infiltrating ductal carcinoma of breast 07/09/2012    [place]strom:  chemo   Past Surgical History  Procedure Laterality Date  . Esophagogastroduodenoscopy    . Esophageal dilation    . Portacath placement  07/22/2012    Procedure: INSERTION PORT-A-CATH;  Surgeon: Donato Heinz, MD;  Location: [facility];  Service: General;  Laterality: Left;  left subclavian  . Esophagogastroduodenoscopy (egd) with esophageal dilation  10/04/2005    Rourk-Extensive geographic ulcerations distal third of the tubular esoophagus consistent with severe ulcerative relux esophagitis, early stricture formation status post dilation as described above. 2.  Multiple gastric ulcers without bleeding stigmata as described above. otherwise normal stomach.  3. large bulbar ulcer without bleeding stigmata. otherwise D1 and D2 appeared normal.  . Colonoscopy, esophagogastroduodenoscopy (egd) and esophageal dilation  08/21/2012    Dr. Gala Romney: colonoscopy with internal hemorrhoids, friable anal canal, left-sided diverticulosis. EGD with question of eosinophilic esophagitis but NEGATIVE path. Dilation peformed with scope passage alone  . Breast biopsy Right 04/2012  .  Mastectomy Right 06/08/12  . Tubal ligation  1989  . Femur im nail Right 01/21/2013    Procedure: INTRAMEDULLARY (IM) RETROGRADE FEMORAL NAILING;  Surgeon: Hessie Dibble, MD;  Location: [facility];  Service: Orthopedics;  Laterality: Right;  . Orif ankle fracture Left 09/15/2013    Procedure: OPEN REDUCTION INTERNAL FIXATION (ORIF) LEFTANKLE FRACTURE;  Surgeon: Sanjuana Kava, MD;  Location: [facility];  Service: Orthopedics;  Laterality: Left;  . Esophagogastroduodenoscopy N/A 11/01/2013     Procedure: ESOPHAGOGASTRODUODENOSCOPY (EGD);  Surgeon: Daneil Dolin, MD;  Location: [facility];  Service: Endoscopy;  Laterality: N/A;   Family History  Problem Relation Age of Onset  . Heart attack Father   . COPD Father   . Alcohol abuse Brother   . Alcohol abuse Brother   . Diabetes Mother   . Colon cancer Neg Hx    History  Substance Use Topics  . Smoking status: Current Every Day Smoker -- 0.25 packs/day for 26 years    Last Attempt to Quit: 08/07/2011  . Smokeless tobacco: Never Used  . Alcohol Use: No     Comment: Patient denies current ETOH use but notes reveal +ETOH abuse in recent past   OB History   Grav Para Term Preterm Abortions TAB SAB Ect Mult Living   6 6        6      Review of Systems  Constitutional: Negative for fever, chills, diaphoresis, appetite change and fatigue.  HENT: Negative for mouth sores, sore throat and trouble swallowing.   Eyes: Negative for visual disturbance.  Respiratory: Negative for cough, chest tightness, shortness of breath and wheezing.   Cardiovascular: Negative for chest pain.  Gastrointestinal: Positive for abdominal pain. Negative for nausea, vomiting, diarrhea and abdominal distention.       "Charcoal stools".  Endocrine: Negative for polydipsia, polyphagia and polyuria.  Genitourinary: Negative for dysuria, frequency and hematuria.  Musculoskeletal: Positive for arthralgias and myalgias. Negative for gait problem.       Left ankle pain, right knee/thigh pain  Skin: Negative for color change, pallor and rash.  Neurological: Negative for dizziness, syncope, light-headedness and headaches.  Hematological: Does not bruise/bleed easily.  Psychiatric/Behavioral: Negative for behavioral problems and confusion.  All other systems reviewed and are negative.   Allergies  Citalopram; Effexor; Motrin; and Trazodone and nefazodone  Home Medications   No current outpatient prescriptions on file. Triage Vitals: BP 121/97  Pulse  123  Temp(Src) 98.5 F (36.9 C) (Oral)  Resp 25  Ht 5\' 1"  (1.549 m)  Wt 180 lb (81.647 kg)  BMI 34.03 kg/m2  SpO2 98%  Physical Exam  Nursing note and vitals reviewed. Constitutional: She is oriented to person, place, and time. No distress.  Obese female. Intermittently screaming out in pain. Intermittently peaceful and conversant.  HENT:  Head: Normocephalic.  No scleral icterus. Conjunctiva not pale  Eyes: Conjunctivae are normal. Pupils are equal, round, and reactive to light. No scleral icterus.  No lower eyelid pallor  Neck: Normal range of motion. Neck supple. No thyromegaly present.  Cardiovascular: Normal rate and regular rhythm.  Exam reveals no gallop and no friction rub.   No murmur heard. Pulmonary/Chest: Effort normal and breath sounds normal. No respiratory distress. She has no wheezes. She has no rales.  Abdominal: Soft. Bowel sounds are normal. She exhibits no distension. There is no tenderness. There is no rebound.  Abdomen obese. Distended. Multiple areas of ecchymosis anterior abdomen consistent with recent Lovenox use. Rectal is  dark stool. Lab is guaiac positive.  Musculoskeletal: Normal range of motion.  Planes of pain of the left ankle is approached. Has well-healed incision medially and laterally. Good perfusion to both feet with palpable DP and PT pulses and good capillary refill. No signs of ischemic limb. Soft tissue swelling of the right knee noted. Painful to move the right knee as well.  Neurological: She is alert and oriented to person, place, and time.  Skin: Skin is warm and dry. Rash noted.  Petechial rash dorsal right forearm  Palm sized ecchymosis volar right forearm Multiple various sized areas of ecchymosis over her abdominal wall.   Psychiatric:  Is intermittently almost wildly complaining of pain. Other times, conversant. She is oriented to person place and time. Her area of concern and complaint of pain is intermittent and fluctuating between  her left ankle, right knee, and her abdomen.    ED Course  Procedures (including critical care time) DIAGNOSTIC STUDIES: Oxygen Saturation is 98% on room air, normal by my interpretation.    COORDINATION OF CARE: At 1240 PM Discussed treatment plan with patient which includes phenergan, dilaudid, CXR, left foot, left knee, left tib/fib X-rays, abdominal CT, UA, blood work and EKG. Patient agrees.   Labs Review Labs Reviewed  CBC WITH DIFFERENTIAL - Abnormal; Notable for the following:    RBC 3.41 (*)    MCV 108.2 (*)    MCH 36.1 (*)    All other components within normal limits  COMPREHENSIVE METABOLIC PANEL - Abnormal; Notable for the following:    Sodium 133 (*)    Potassium 2.9 (*)    Chloride 89 (*)    Glucose, Bld 108 (*)    Albumin 1.7 (*)    AST 45 (*)    Alkaline Phosphatase 269 (*)    Total Bilirubin 2.6 (*)    All other components within normal limits  PROTIME-INR - Abnormal; Notable for the following:    Prothrombin Time 16.2 (*)    All other components within normal limits  URINALYSIS, ROUTINE W REFLEX MICROSCOPIC - Abnormal; Notable for the following:    Color, Urine BROWN (*)    APPearance CLOUDY (*)    Glucose, UA 100 (*)    Bilirubin Urine LARGE (*)    Ketones, ur TRACE (*)    Protein, ur 30 (*)    Urobilinogen, UA 4.0 (*)    Nitrite POSITIVE (*)    All other components within normal limits  LACTIC ACID, PLASMA - Abnormal; Notable for the following:    Lactic Acid, Venous 6.4 (*)    All other components within normal limits  URINE MICROSCOPIC-ADD ON - Abnormal; Notable for the following:    Bacteria, UA FEW (*)    All other components within normal limits  COMPREHENSIVE METABOLIC PANEL - Abnormal; Notable for the following:    Sodium 134 (*)    BUN 5 (*)    Creatinine, Ser 0.48 (*)    Calcium 8.2 (*)    Albumin 1.6 (*)    AST 41 (*)    Alkaline Phosphatase 248 (*)    Total Bilirubin 2.3 (*)    All other components within normal limits  CBC -  Abnormal; Notable for the following:    RBC 3.23 (*)    Hemoglobin 11.5 (*)    HCT 35.0 (*)    MCV 108.4 (*)    MCH 35.6 (*)    RDW 15.7 (*)    All other components within normal limits  PROTIME-INR - Abnormal; Notable for the following:    Prothrombin Time 17.1 (*)    All other components within normal limits  LACTIC ACID, PLASMA - Abnormal; Notable for the following:    Lactic Acid, Venous 3.0 (*)    All other components within normal limits  CBC - Abnormal; Notable for the following:    RBC 2.89 (*)    Hemoglobin 10.4 (*)    HCT 31.7 (*)    MCV 109.7 (*)    MCH 36.0 (*)    RDW 16.3 (*)    All other components within normal limits  BASIC METABOLIC PANEL - Abnormal; Notable for the following:    Sodium 135 (*)    BUN 5 (*)    Creatinine, Ser 0.46 (*)    Calcium 8.2 (*)    All other components within normal limits  LACTIC ACID, PLASMA - Abnormal; Notable for the following:    Lactic Acid, Venous 3.4 (*)    All other components within normal limits  CBC - Abnormal; Notable for the following:    RBC 2.81 (*)    Hemoglobin 10.2 (*)    HCT 31.3 (*)    MCV 111.4 (*)    MCH 36.3 (*)    RDW 16.4 (*)    All other components within normal limits  COMPREHENSIVE METABOLIC PANEL - Abnormal; Notable for the following:    Sodium 136 (*)    BUN 5 (*)    Calcium 8.0 (*)    Total Protein 5.1 (*)    Albumin 1.4 (*)    AST 39 (*)    Alkaline Phosphatase 211 (*)    Total Bilirubin 1.8 (*)    All other components within normal limits  LACTIC ACID, PLASMA - Abnormal; Notable for the following:    Lactic Acid, Venous 2.9 (*)    All other components within normal limits  OCCULT BLOOD, POC DEVICE - Abnormal; Notable for the following:    Fecal Occult Bld POSITIVE (*)    All other components within normal limits  URINE CULTURE  AMMONIA  MAGNESIUM  TSH  AMMONIA  OCCULT BLOOD X 1 CARD TO LAB, STOOL  CBG MONITORING, ED  TYPE AND SCREEN   Imaging Review No results found.   EKG  Interpretation None      MDM   Final diagnoses:  GI bleed  Lactic acidosis  Abdominal pain  Cirrhosis    Discussion:  Has elevated lactate. Hypokalemicat 2.9.   Has guaiac positive stool. Abdominal exam is rather benign. Intermittently complains markedly of abdominal pain. Stool is  guaiac positive, and has  elevated lactate.   This could be ischemic colitis. She is not hypotensive. She has not been out of her narcotics. Her recent admission showed concern for narcotic withdrawal. She has pain and swelling around her a knee and has an x-ray that suggests a slightly different appearance to her healing fracture. I will ask her orthopedist to see her in consult. Feel she needs admission for pain control, potassium replacement, GIB.  Possible evaluation for her abdominal pain being ischemic colitis. Her CT is pending. Her guaiac + stools may simply be related to her recent Lovenox use. She's been on this daily since being discharged from the hospital with orthopedic injuries.  I personally performed the services described in this documentation, which was scribed in my presence. The recorded information has been reviewed and is accurate.     Tanna Furry, MD 10/31/13 [place], MD  11/03/13 1631 

## 2013-10-31 NOTE — Progress Notes (Deleted)
[facility] History and Physical  Elizabeth Orr QIO:[id number] DOB: 1961-01-24 DOA: 10/31/2013  Referring physician: Dr. Tanna Furry, ER physician PCP: Yevette Edwards, NP   Chief Complaint: Abdominal pain  HPI: Elizabeth Orr is a 53 y.o. female with a history of alcoholic cirrhosis, recent hip surgery/ankle surgery who is essentially bedbound since these fractures, who has had multiple admissions for various reasons over the past 2 months. She presents to the emergency room today with complaints of abdominal pain. There have been reports the onset of symptoms approximately 3 days ago has progressively gotten worse. He was initially intermittent but appears to be more frequent at this time. He reports that on Thursday, she had a brown-colored stool which was only mildly dark. She did not have any bowel movements on Friday and Saturday. Today, she had charcoal colored stools. She complains of severe abdominal pain which has progressively gotten worse, worse today. Her husband reports that she's been having vomiting as well, but denies any coffee ground emesis or hematemesis. He is unsure whether she's had any hematochezia. She has been taking Lovenox dosing for DVT prophylaxis post orthopedic surgery. He is unsure whether she's she's had any fever. She's not had any sick contacts. Her husband cares for her at home. She was evaluated in the emergency room where CT scan of the abdomen and pelvis did not show any acute findings. Her lactic acid was markedly elevated. There was concern for underlying ischemic colitis versus other GI bleed. 2 the patient's ongoing abdominal symptoms, she's been referred for admission   Review of Systems:  Limited due to patient's mental status.  Past Medical History  Diagnosis Date  . Kidney stones 1989  . GERD (gastroesophageal reflux disease)   . History of stomach ulcers 2003/2007    Hx of esophageal ulcers and stomach ulcers due to reflux  . Back pain   .  Chronic leg pain   . Polysubstance abuse   . Depression   . Anxiety   . Exertional shortness of breath   . History of blood transfusion     "recently had my 2nd; related to chemo" (01/21/2013)  . LKGMWNUU(725.3)     "monthly" (01/21/2013)  . Hepatic steatosis 09/26/2013  . Anemia, iron deficiency 09/26/2013  . Macrocytosis 09/26/2013  . Alcoholism 09/27/2013    In recovery  . Alcoholic cirrhosis of liver 09/28/2013  . New onset seizure 10/08/2013  . Breast cancer 06/08/12    right mastectomy  . Infiltrating ductal carcinoma of breast 07/09/2012    [place]strom:  chemo   Past Surgical History  Procedure Laterality Date  . Esophagogastroduodenoscopy    . Esophageal dilation    . Portacath placement  07/22/2012    Procedure: INSERTION PORT-A-CATH;  Surgeon: Donato Heinz, MD;  Location: [facility];  Service: General;  Laterality: Left;  left subclavian  . Esophagogastroduodenoscopy (egd) with esophageal dilation  10/04/2005    Rourk-Extensive geographic ulcerations distal third of the tubular esoophagus consistent with severe ulcerative relux esophagitis, early stricture formation status post dilation as described above. 2.  Multiple gastric ulcers without bleeding stigmata as described above. otherwise normal stomach.  3. large bulbar ulcer without bleeding stigmata. otherwise D1 and D2 appeared normal.  . Colonoscopy, esophagogastroduodenoscopy (egd) and esophageal dilation  08/21/2012    Procedure: COLONOSCOPY, ESOPHAGOGASTRODUODENOSCOPY (EGD) AND ESOPHAGEAL DILATION;  Surgeon: Daneil Dolin, MD;  Location: [facility];  Service: Endoscopy;  Laterality: N/A;  9:30  . Breast biopsy Right 04/2012  .  Mastectomy Right 06/08/12  . Tubal ligation  1989  . Femur im nail Right 01/21/2013    Procedure: INTRAMEDULLARY (IM) RETROGRADE FEMORAL NAILING;  Surgeon: Hessie Dibble, MD;  Location: [facility];  Service: Orthopedics;  Laterality: Right;  . Orif ankle fracture Left 09/15/2013    Procedure: OPEN REDUCTION  INTERNAL FIXATION (ORIF) LEFTANKLE FRACTURE;  Surgeon: Sanjuana Kava, MD;  Location: [facility];  Service: Orthopedics;  Laterality: Left;   Social History:  reports that she has been smoking.  She has never used smokeless tobacco. She reports that she does not drink alcohol or use illicit drugs.  Allergies  Allergen Reactions  . Citalopram     Makes her feel drowsy/sleepy.  . Effexor [Venlafaxine Hcl]     Makes pt feel very bad,will not take again.  . Motrin [Ibuprofen] Swelling    Lips swell  . Trazodone And Nefazodone     Makes pt feel very bad and "hung over"    Family History  Problem Relation Age of Onset  . Heart attack Father   . COPD Father   . Alcohol abuse Brother   . Alcohol abuse Brother   . Diabetes Mother      Prior to Admission medications   Medication Sig Start Date End Date Taking? Authorizing Provider  albuterol (PROVENTIL HFA;VENTOLIN HFA) 108 (90 BASE) MCG/ACT inhaler Inhale 2 puffs into the lungs every 6 (six) hours as needed for wheezing. 09/28/13  Yes Rexene Alberts, MD  aspirin EC 81 MG EC tablet Take 1 tablet (81 mg total) by mouth daily. 09/28/13  Yes Rexene Alberts, MD  docusate sodium (COLACE) 100 MG capsule Take 100 mg by mouth 2 (two) times daily.   Yes Historical Provider, MD  enoxaparin (LOVENOX) 40 MG/0.4ML injection Inject 0.4 mLs (40 mg total) into the skin daily. 10/11/13  Yes Samuella Cota, MD  furosemide (LASIX) 40 MG tablet Take 1 tablet (40 mg total) by mouth daily. Take for swelling. 09/28/13  Yes Rexene Alberts, MD  gabapentin (NEURONTIN) 300 MG capsule Take 3 capsules (900 mg total) by mouth 2 (two) times daily. 09/28/13  Yes Rexene Alberts, MD  iron polysaccharides (NIFEREX) 150 MG capsule Take 1 capsule (150 mg total) by mouth daily. 09/28/13  Yes Rexene Alberts, MD  magnesium hydroxide (MILK OF MAGNESIA) 400 MG/5ML suspension Take 30 mLs by mouth daily as needed for constipation.   Yes Historical Provider, MD  Multiple Vitamins-Iron  (MULTIVITAMINS WITH IRON) TABS tablet Take 1 tablet by mouth daily. 09/28/13  Yes Rexene Alberts, MD  omeprazole (PRILOSEC) 20 MG capsule Take 1 capsule (20 mg total) by mouth daily. 09/28/13  Yes Rexene Alberts, MD  oxyCODONE-acetaminophen (PERCOCET/ROXICET) 5-325 MG per tablet Take 1 tablet by mouth every 12 (twelve) hours as needed for moderate pain or severe pain. 10/11/13  Yes Samuella Cota, MD  polyethylene glycol [facility] [place] [facility]) packet Take 17 g by mouth daily.   Yes Historical Provider, MD  potassium chloride SA (K-DUR,KLOR-CON) 20 MEQ tablet Take 1 tablet (20 mEq total) by mouth daily. 09/28/13  Yes Rexene Alberts, MD  spironolactone (ALDACTONE) 25 MG tablet Take 1 tablet (25 mg total) by mouth daily. For generalized swelling. 09/28/13  Yes Rexene Alberts, MD  thiamine 100 MG tablet Take 1 tablet (100 mg total) by mouth daily. 09/28/13  Yes Rexene Alberts, MD  Vitamin D, Ergocalciferol, (DRISDOL) 50000 UNITS CAPS capsule Take 50,000 Units by mouth every 7 (seven) days.   Yes Historical Provider, MD  ciprofloxacin (  CIPRO) 500 MG tablet Take 500 mg by mouth 2 (two) times daily.    Historical Provider, MD   Physical Exam: Filed Vitals:   10/31/13 1640  BP: 121/74  Pulse: 103  Temp: 97.8 F (36.6 C)  Resp: 20    BP 121/74  Pulse 103  Temp(Src) 97.8 F (36.6 C) (Oral)  Resp 20  Ht 4\' 11"  (1.499 m)  Wt 81.1 kg (178 lb 12.7 oz)  BMI 36.09 kg/m2  SpO2 100%  General:  Patient is confused, somnolent but wakes up and moans in pain. She answers questions, but does not appear completely reliable her answers. Can be easily distracted while in pain. Eyes: PERRL, normal lids, irises & conjunctiva ENT: grossly normal hearing, lips & tongue. Poor dentition Neck: no LAD, masses or thyromegaly Cardiovascular: RRR, no m/r/g. Trace lower extremity edema. Telemetry: SR, no arrhythmias  Respiratory: CTA bilaterally, no w/r/r. Normal respiratory effort. Abdomen: soft, nt , obese, positive bowel  sounds. Patient did not wince or withdraw on palpation Skin: Scattered spider angiomas Musculoskeletal: Muscle wasting in upper and lower extremities Psychiatric: Lethargic, tearful and moaning  Neurologic: Could not fully assess due to patient's mental status           Labs on Admission:  Basic Metabolic Panel:  Recent Labs Lab 10/28/13 1102 10/31/13 1241 10/31/13 1412  NA 132* 133*  --   K 4.3 2.9*  --   CL 93* 89*  --   CO2 23 28  --   GLUCOSE 97 108*  --   BUN 6 6  --   CREATININE 0.63 0.51  --   CALCIUM 9.0 8.7  --   MG  --   --  1.9   Liver Function Tests:  Recent Labs Lab 10/28/13 1102 10/31/13 1241  AST 31 45*  ALT 16 16  ALKPHOS 262* 269*  BILITOT 3.0* 2.6*  PROT 6.8 6.5  ALBUMIN 1.8* 1.7*   No results found for this basename: LIPASE, AMYLASE,  in the last 168 hours  Recent Labs Lab 10/31/13 1244  AMMONIA 37   CBC:  Recent Labs Lab 10/28/13 1102 10/31/13 1241  WBC 11.7* 8.6  NEUTROABS 9.1* 6.2  HGB 12.8 12.3  HCT 40.8 36.9  MCV 113.3* 108.2*  PLT 203 172   Cardiac Enzymes: No results found for this basename: CKTOTAL, CKMB, CKMBINDEX, TROPONINI,  in the last 168 hours  BNP (last 3 results)  Recent Labs  09/25/13 1656  PROBNP 1957.0*   CBG:  Recent Labs Lab 10/31/13 1227  GLUCAP 94    Radiological Exams on Admission: Dg Tibia/fibula Left  10/31/2013   CLINICAL DATA:  Leg pain  EXAM: LEFT TIBIA AND FIBULA - 2 VIEW  COMPARISON:  None.  FINDINGS: Postsurgical changes are noted. No acute bony abnormality is seen. No soft tissue abnormality is noted.  IMPRESSION: No acute abnormality seen.   Electronically Signed   By: Inez Catalina M.D.   On: 10/31/2013 13:46   Ct Abdomen Pelvis W Contrast  10/31/2013   CLINICAL DATA:  Diffuse abdominal pain. Cirrhosis. History of breast cancer.  EXAM: CT ABDOMEN AND PELVIS WITH CONTRAST  TECHNIQUE: Multidetector CT imaging of the abdomen and pelvis was performed using the standard protocol following  bolus administration of intravenous contrast.  CONTRAST:  154mL OMNIPAQUE IOHEXOL 300 MG/ML SOLN, 21mL OMNIPAQUE IOHEXOL 300 MG/ML SOLN  COMPARISON:  CT scan dated 10/08/2013  FINDINGS: There is hepatomegaly with diffuse hepatic steatosis. Biliary tree, spleen, pancreas, adrenal glands,  and kidneys are normal except for tiny cysts in both kidneys.  Bowel appears normal. There is a small amount of ascites in the pelvis which is new. There is a stable 2.7 cm cyst on the left ovary. No osseous abnormality. Lung bases are clear.  IMPRESSION: 1. New small amount of ascites in the pelvis since the prior study. 2. Chronic hepatomegaly with hepatic steatosis.   Electronically Signed   By: Rozetta Nunnery M.D.   On: 10/31/2013 16:51   Dg Chest Port 1 View  10/31/2013   CLINICAL DATA:  Abdominal pain  EXAM: PORTABLE CHEST - 1 VIEW  COMPARISON:  2/19/ 15  FINDINGS: Cardiac shadow is stable. The lungs are clear bilaterally. A left chest wall port is again seen and stable. Postsurgical changes are noted on the right.  IMPRESSION: No acute abnormality noted.   Electronically Signed   By: Inez Catalina M.D.   On: 10/31/2013 13:42   Dg Knee Complete 4 Views Right  10/31/2013   CLINICAL DATA:  Leg pain  EXAM: RIGHT KNEE - COMPLETE 4+ VIEW  COMPARISON:  None.  FINDINGS: There are changes consistent with prior medullary rod placement. There is an oblique fracture identified through the distal metaphysis extending inferiorly around the orthopedic hardware. It has a different appearance than that seen on the prior exam and may represent an acute fracture. Some callus formation is noted  IMPRESSION: Changes consistent with prior surgical fixation. There are changes suggestive of an acute fracture in the distal femoral metaphysis. Correlation with any recent trauma is recommended.   Electronically Signed   By: Inez Catalina M.D.   On: 10/31/2013 13:45   Dg Foot Complete Left  10/31/2013   CLINICAL DATA:  Left foot pain  EXAM: LEFT  FOOT - COMPLETE 3+ VIEW  COMPARISON:  None.  FINDINGS: Postsurgical changes are noted in the distal fibula. No acute bony abnormality is noted.  IMPRESSION: No acute abnormality seen.   Electronically Signed   By: Inez Catalina M.D.   On: 10/31/2013 13:42    EKG: Independently reviewed. Sinus tachycardia. No acute changes  Assessment/Plan Active Problems:   Hepatic steatosis   Hypoalbuminemia   Alcoholic cirrhosis of liver   Abdominal pain   GI bleed   Hypokalemia   Fracture of knee region   1. GI bleeding. Her husband reports that she's been having melena. She does have a history of alcoholic cirrhosis. We'll start the patient on twice a day Protonix. Clear liquids for now n.p.o. after midnight. We'll request a GI consultation in the morning to consider EGD tomorrow. We'll provide pain medication for abdominal pain. Cycle hemoglobins. With severe abdominal pain and elevated lactic acid, there is a concern for underlying ischemic colitis. Although the true severity of her pain is unclear, since she can easily be distracted and symptoms are not quite reproducible when palpated with stethoscope. 2. Alcoholic cirrhosis of liver. Periportal patient has not been drinking alcohol for the last 2 months. Liver function tests are not significantly elevated 3. Hypokalemia. Possibly related to diuretic use at home. We'll replace 4. Fracture of the region. Unclear as to etiology. Has been reports of dyspnea occurred while he was putting her in a hoyer lift and her legs are hanging down. Dr. Aline Brochure has been consulted since he has done her prior surgeries. 5. Hypoalbuminemia, likely related to liver disease. Nutrition consult 6. Possible urinary tract infection. Nitrates positive in the urine. Urine culture has been sent. We'll treat empirically with  Rocephin for now. 7. Confusion. Has been reports of this and present for the last 2 months. Ammonia level is normal. Appears to be a chronic process.   Code  Status: With her multiple comorbidities, very poor functional status and overall poor quality of life, I recommended DO NOT RESUSCITATE. Patient's husband is agreeable Family Communication: discussed with husband at the bedside Disposition Plan: pending hospital course  Time spent:5mins  [facility] Pager [phone]

## 2013-10-31 NOTE — ED Notes (Signed)
Pedal pulses present with right stronger than left.

## 2013-10-31 NOTE — ED Notes (Signed)
CRITICAL VALUE ALERT  Critical value received:  Potassium 2.9  Date of notification:  10/31/13  Time of notification:  1321  Critical value read back:yes  Nurse who received alert:  Benay Pillow  MD notified (1st page):    Time of first page:    MD notified (2nd page):  Time of second page:  Responding MD:  Tanna Furry, MD  Time MD responded:  [phone]

## 2013-11-01 ENCOUNTER — Encounter (HOSPITAL_COMMUNITY): Payer: Self-pay | Admitting: Gastroenterology

## 2013-11-01 ENCOUNTER — Telehealth (HOSPITAL_COMMUNITY): Payer: Self-pay

## 2013-11-01 ENCOUNTER — Other Ambulatory Visit (HOSPITAL_COMMUNITY): Payer: Self-pay | Admitting: Oncology

## 2013-11-01 ENCOUNTER — Inpatient Hospital Stay (HOSPITAL_COMMUNITY): Admission: RE | Admit: 2013-11-01 | Payer: Medicaid Other | Source: Ambulatory Visit | Admitting: Physical Therapy

## 2013-11-01 ENCOUNTER — Encounter (HOSPITAL_COMMUNITY)
Admission: EM | Disposition: A | Discharge status: Home Health | Payer: Self-pay | Source: Home / Self Care | Attending: Internal Medicine

## 2013-11-01 DIAGNOSIS — K449 Diaphragmatic hernia without obstruction or gangrene: Secondary | ICD-10-CM

## 2013-11-01 DIAGNOSIS — E876 Hypokalemia: Secondary | ICD-10-CM

## 2013-11-01 DIAGNOSIS — K922 Gastrointestinal hemorrhage, unspecified: Secondary | ICD-10-CM

## 2013-11-01 DIAGNOSIS — C50919 Malignant neoplasm of unspecified site of unspecified female breast: Secondary | ICD-10-CM

## 2013-11-01 DIAGNOSIS — Z0000 Encounter for general adult medical examination without abnormal findings: Secondary | ICD-10-CM

## 2013-11-01 DIAGNOSIS — S72453A Displaced supracondylar fracture without intracondylar extension of lower end of unspecified femur, initial encounter for closed fracture: Secondary | ICD-10-CM

## 2013-11-01 DIAGNOSIS — S82843A Displaced bimalleolar fracture of unspecified lower leg, initial encounter for closed fracture: Secondary | ICD-10-CM

## 2013-11-01 DIAGNOSIS — K209 Esophagitis, unspecified without bleeding: Secondary | ICD-10-CM

## 2013-11-01 HISTORY — PX: ESOPHAGOGASTRODUODENOSCOPY: SHX5428

## 2013-11-01 LAB — COMPREHENSIVE METABOLIC PANEL
ALBUMIN: 1.6 g/dL — AB (ref 3.5–5.2)
ALK PHOS: 248 U/L — AB (ref 39–117)
ALT: 14 U/L (ref 0–35)
AST: 41 U/L — ABNORMAL HIGH (ref 0–37)
BUN: 5 mg/dL — ABNORMAL LOW (ref 6–23)
CO2: 25 mEq/L (ref 19–32)
CREATININE: 0.48 mg/dL — AB (ref 0.50–1.10)
Calcium: 8.2 mg/dL — ABNORMAL LOW (ref 8.4–10.5)
Chloride: 98 mEq/L (ref 96–112)
GFR calc Af Amer: >90 mL/min (ref >90)
GFR calc non Af Amer: >90 mL/min (ref >90)
Glucose, Bld: 89 mg/dL (ref 70–99)
POTASSIUM: 3.8 meq/L (ref 3.7–5.3)
Sodium: 134 mEq/L — ABNORMAL LOW (ref 137–147)
TOTAL PROTEIN: 6 g/dL (ref 6.0–8.3)
Total Bilirubin: 2.3 mg/dL — ABNORMAL HIGH (ref 0.3–1.2)

## 2013-11-01 LAB — CBC
HEMATOCRIT: 35 % — AB (ref 36.0–46.0)
Hemoglobin: 11.5 g/dL — ABNORMAL LOW (ref 12.0–15.0)
MCH: 35.6 pg — ABNORMAL HIGH (ref 26.0–34.0)
MCHC: 32.9 g/dL (ref 30.0–36.0)
MCV: 108.4 fL — AB (ref 78.0–100.0)
Platelets: 168 10*3/uL (ref 150–400)
RBC: 3.23 MIL/uL — AB (ref 3.87–5.11)
RDW: 15.7 % — ABNORMAL HIGH (ref 11.5–15.5)
WBC: 7.1 10*3/uL (ref 4.0–10.5)

## 2013-11-01 LAB — PROTIME-INR
INR: 1.43 (ref 0.00–1.49)
Prothrombin Time: 17.1 seconds — ABNORMAL HIGH (ref 11.6–15.2)

## 2013-11-01 LAB — LACTIC ACID, PLASMA: Lactic Acid, Venous: 3 mmol/L — ABNORMAL HIGH (ref 0.5–2.2)

## 2013-11-01 LAB — AMMONIA: AMMONIA: 43 umol/L (ref 11–60)

## 2013-11-01 LAB — TSH: TSH: 2.685 u[IU]/mL (ref 0.350–4.500)

## 2013-11-01 LAB — OCCULT BLOOD, POC DEVICE: Fecal Occult Bld: POSITIVE — AB

## 2013-11-01 SURGERY — EGD (ESOPHAGOGASTRODUODENOSCOPY)
Anesthesia: Moderate Sedation

## 2013-11-01 MED ORDER — MEPERIDINE HCL 100 MG/ML IJ SOLN
INTRAMUSCULAR | Status: AC
  Administered 2013-11-01: 16:00:00
  Filled 2013-11-01: qty 2

## 2013-11-01 MED ORDER — LIDOCAINE VISCOUS 2 % MT SOLN
OROMUCOSAL | Status: DC | PRN
  Administered 2013-11-01: 2 mL via OROMUCOSAL

## 2013-11-01 MED ORDER — MIDAZOLAM HCL 5 MG/5ML IJ SOLN
INTRAMUSCULAR | Status: AC
  Administered 2013-11-01: 16:00:00
  Filled 2013-11-01: qty 10

## 2013-11-01 MED ORDER — MEPERIDINE HCL 100 MG/ML IJ SOLN
INTRAMUSCULAR | Status: DC | PRN
  Administered 2013-11-01: 25 mg via INTRAVENOUS

## 2013-11-01 MED ORDER — PROMETHAZINE HCL 25 MG/ML IJ SOLN
25.0000 mg | INTRAMUSCULAR | Status: AC
  Administered 2013-11-01: 25 mg via INTRAVENOUS

## 2013-11-01 MED ORDER — SUCRALFATE 1 GM/10ML PO SUSP
1.0000 g | Freq: Three times a day (TID) | ORAL | Status: DC
  Administered 2013-11-02 – 2013-11-04 (×11): 1 g via ORAL
  Filled 2013-11-01 (×12): qty 10

## 2013-11-01 MED ORDER — SODIUM CHLORIDE 0.9 % IJ SOLN
INTRAMUSCULAR | Status: AC
  Administered 2013-11-01: 15:00:00
  Filled 2013-11-01: qty 10

## 2013-11-01 MED ORDER — LIDOCAINE VISCOUS 2 % MT SOLN
OROMUCOSAL | Status: AC
  Administered 2013-11-01: 17:00:00
  Filled 2013-11-01: qty 15

## 2013-11-01 MED ORDER — METOPROLOL TARTRATE 25 MG PO TABS
25.0000 mg | ORAL_TABLET | Freq: Two times a day (BID) | ORAL | Status: DC
  Administered 2013-11-02 – 2013-11-04 (×4): 25 mg via ORAL
  Filled 2013-11-01 (×6): qty 1

## 2013-11-01 MED ORDER — SODIUM CHLORIDE 0.9 % IV SOLN
INTRAVENOUS | Status: DC
  Administered 2013-11-01: 15:00:00 via INTRAVENOUS

## 2013-11-01 MED ORDER — PROMETHAZINE HCL 25 MG/ML IJ SOLN
INTRAMUSCULAR | Status: AC
  Administered 2013-11-01: 15:00:00
  Filled 2013-11-01: qty 1

## 2013-11-01 MED ORDER — MIDAZOLAM HCL 5 MG/5ML IJ SOLN
INTRAMUSCULAR | Status: DC | PRN
  Administered 2013-11-01 (×2): 1 mg via INTRAVENOUS

## 2013-11-01 MED ORDER — STERILE WATER FOR IRRIGATION IR SOLN
Status: DC | PRN
  Administered 2013-11-01: 16:00:00

## 2013-11-01 NOTE — Consult Note (Signed)
Reason for Consult: Recent open treatment internal fixation left ankle by Dr Hilda Lias (09-2013) and possible refracture right femur after retrograde nailing by Dr. Jerl Santos [facility]) [phone] Referring Physician: Dr. Ezzard Standing is an 53 y.o. female.  HPI: Elizabeth Orr is a 53 y.o. female with a history of alcoholic cirrhosis, recent hip surgery/ankle surgery who is essentially bedbound since these fractures, who has had multiple admissions for various reasons over the past 2 months. She presents to the emergency room today with complaints of abdominal pain. There have been reports the onset of symptoms approximately 3 days ago has progressively gotten worse. He was initially intermittent but appears to be more frequent at this time. He reports that on Thursday, she had a brown-colored stool which was only mildly dark. She did not have any bowel movements on Friday and Saturday. Today, she had charcoal colored stools. She complains of severe abdominal pain which has progressively gotten worse, worse today. Her husband reports that she's been having vomiting as well, but denies any coffee ground emesis or hematemesis. He is unsure whether she's had any hematochezia. She has been taking Lovenox dosing for DVT prophylaxis post orthopedic surgery. He is unsure whether she's she's had any fever. She's not had any sick contacts. Her husband cares for her at home. She was evaluated in the emergency room where CT scan of the abdomen and pelvis did not show any acute findings. Her lactic acid was markedly elevated. There was concern for underlying ischemic colitis versus other GI bleed. 2 the patient's ongoing abdominal symptoms, she's been referred for admission     Past Medical History  Diagnosis Date  . Kidney stones 1989  . GERD (gastroesophageal reflux disease)   . History of stomach ulcers 2003/2007    Hx of esophageal ulcers and stomach ulcers due to reflux  . Back pain   . Chronic leg  pain   . Polysubstance abuse   . Depression   . Anxiety   . Exertional shortness of breath   . History of blood transfusion     "recently had my 2nd; related to chemo" (01/21/2013)  . RRGWWNPO(378.8)     "monthly" (01/21/2013)  . Hepatic steatosis 09/26/2013  . Anemia, iron deficiency 09/26/2013  . Macrocytosis 09/26/2013  . Alcoholism 09/27/2013    In recovery  . Alcoholic cirrhosis of liver 09/28/2013  . New onset seizure 10/08/2013  . Breast cancer 06/08/12    right mastectomy  . Infiltrating ductal carcinoma of breast 07/09/2012    Neijstrom:  chemo    Past Surgical History  Procedure Laterality Date  . Esophagogastroduodenoscopy    . Esophageal dilation    . Portacath placement  07/22/2012    Procedure: INSERTION PORT-A-CATH;  Surgeon: Fabio Bering, MD;  Location: [facility];  Service: General;  Laterality: Left;  left subclavian  . Esophagogastroduodenoscopy (egd) with esophageal dilation  10/04/2005    Rourk-Extensive geographic ulcerations distal third of the tubular esoophagus consistent with severe ulcerative relux esophagitis, early stricture formation status post dilation as described above. 2.  Multiple gastric ulcers without bleeding stigmata as described above. otherwise normal stomach.  3. large bulbar ulcer without bleeding stigmata. otherwise D1 and D2 appeared normal.  . Colonoscopy, esophagogastroduodenoscopy (egd) and esophageal dilation  08/21/2012    Dr. Jena Gauss: colonoscopy with internal hemorrhoids, friable anal canal, left-sided diverticulosis. EGD with question of eosinophilic esophagitis but NEGATIVE path. Dilation peformed with scope passage alone  . Breast biopsy Right 04/2012  .  Mastectomy Right 06/08/12  . Tubal ligation  1989  . Femur im nail Right 01/21/2013    Procedure: INTRAMEDULLARY (IM) RETROGRADE FEMORAL NAILING;  Surgeon: Hessie Dibble, MD;  Location: [place];  Service: Orthopedics;  Laterality: Right;  . Orif ankle fracture Left 09/15/2013    Procedure: OPEN  REDUCTION INTERNAL FIXATION (ORIF) LEFTANKLE FRACTURE;  Surgeon: Sanjuana Kava, MD;  Location: [facility];  Service: Orthopedics;  Laterality: Left;    Family History  Problem Relation Age of Onset  . Heart attack Father   . COPD Father   . Alcohol abuse Brother   . Alcohol abuse Brother   . Diabetes Mother   . Colon cancer Neg Hx     Social History:  reports that she has been smoking.  She has never used smokeless tobacco. She reports that she does not drink alcohol or use illicit drugs.  Allergies:  Allergies  Allergen Reactions  . Citalopram     Makes her feel drowsy/sleepy.  . Effexor [Venlafaxine Hcl]     Makes pt feel very bad,will not take again.  . Motrin [Ibuprofen] Swelling    Lips swell  . Trazodone And Nefazodone     Makes pt feel very bad and "hung over"    Medications: I have reviewed the patient's current medications.  Results for orders placed during the hospital encounter of 10/31/13 (from the past 48 hour(s))  CBG MONITORING, ED     Status: None   Collection Time    10/31/13 12:27 PM      Result Value Ref Range   Glucose-Capillary 94  70 - 99 mg/dL  CBC WITH DIFFERENTIAL     Status: Abnormal   Collection Time    10/31/13 12:41 PM      Result Value Ref Range   WBC 8.6  4.0 - 10.5 K/uL   RBC 3.41 (*) 3.87 - 5.11 MIL/uL   Hemoglobin 12.3  12.0 - 15.0 g/dL   HCT 36.9  36.0 - 46.0 %   MCV 108.2 (*) 78.0 - 100.0 fL   MCH 36.1 (*) 26.0 - 34.0 pg   MCHC 33.3  30.0 - 36.0 g/dL   RDW 15.2  11.5 - 15.5 %   Platelets 172  150 - 400 K/uL   Neutrophils Relative % 72  43 - 77 %   Neutro Abs 6.2  1.7 - 7.7 K/uL   Lymphocytes Relative 20  12 - 46 %   Lymphs Abs 1.8  0.7 - 4.0 K/uL   Monocytes Relative 7  3 - 12 %   Monocytes Absolute 0.6  0.1 - 1.0 K/uL   Eosinophils Relative 1  0 - 5 %   Eosinophils Absolute 0.0  0.0 - 0.7 K/uL   Basophils Relative 0  0 - 1 %   Basophils Absolute 0.0  0.0 - 0.1 K/uL  COMPREHENSIVE METABOLIC PANEL     Status: Abnormal    Collection Time    10/31/13 12:41 PM      Result Value Ref Range   Sodium 133 (*) 137 - 147 mEq/L   Potassium 2.9 (*) 3.7 - 5.3 mEq/L   Comment: CRITICAL RESULT CALLED TO, READ BACK BY AND VERIFIED WITH:     EDWARDS,C AT 1321 BY GODFREY,O ON 10/31/13    Chloride 89 (*) 96 - 112 mEq/L   CO2 28  19 - 32 mEq/L   Glucose, Bld 108 (*) 70 - 99 mg/dL   BUN 6  6 - 23 mg/dL  Creatinine, Ser 0.51  0.50 - 1.10 mg/dL   Calcium 8.7  8.4 - 17.4 mg/dL   Total Protein 6.5  6.0 - 8.3 g/dL   Albumin 1.7 (*) 3.5 - 5.2 g/dL   AST 45 (*) 0 - 37 U/L   ALT 16  0 - 35 U/L   Alkaline Phosphatase 269 (*) 39 - 117 U/L   Total Bilirubin 2.6 (*) 0.3 - 1.2 mg/dL   GFR calc non Af Amer >90  >90 mL/min   GFR calc Af Amer >90  >90 mL/min   Comment: (NOTE)     The eGFR has been calculated using the CKD EPI equation.     This calculation has not been validated in all clinical situations.     eGFR's persistently <90 mL/min signify possible Chronic Kidney     Disease.  PROTIME-INR     Status: Abnormal   Collection Time    10/31/13 12:41 PM      Result Value Ref Range   Prothrombin Time 16.2 (*) 11.6 - 15.2 seconds   INR 1.33  0.00 - 1.49  TYPE AND SCREEN     Status: None   Collection Time    10/31/13 12:44 PM      Result Value Ref Range   ABO/RH(D) O POS     Antibody Screen NEG     Sample Expiration 11/03/2013    AMMONIA     Status: None   Collection Time    10/31/13 12:44 PM      Result Value Ref Range   Ammonia 37  11 - 60 umol/L  LACTIC ACID, PLASMA     Status: Abnormal   Collection Time    10/31/13 12:44 PM      Result Value Ref Range   Lactic Acid, Venous 6.4 (*) 0.5 - 2.2 mmol/L  OCCULT BLOOD, POC DEVICE     Status: Abnormal   Collection Time    10/31/13  1:17 PM      Result Value Ref Range   Fecal Occult Bld POSITIVE (*) NEGATIVE  URINALYSIS, ROUTINE W REFLEX MICROSCOPIC     Status: Abnormal   Collection Time    10/31/13  1:40 PM      Result Value Ref Range   Color, Urine BROWN (*) YELLOW    Comment: BIOCHEMICALS MAY BE AFFECTED BY COLOR   APPearance CLOUDY (*) CLEAR   Specific Gravity, Urine 1.025  1.005 - 1.030   pH 5.5  5.0 - 8.0   Glucose, UA 100 (*) NEGATIVE mg/dL   Hgb urine dipstick NEGATIVE  NEGATIVE   Bilirubin Urine LARGE (*) NEGATIVE   Ketones, ur TRACE (*) NEGATIVE mg/dL   Protein, ur 30 (*) NEGATIVE mg/dL   Urobilinogen, UA 4.0 (*) 0.0 - 1.0 mg/dL   Nitrite POSITIVE (*) NEGATIVE   Leukocytes, UA NEGATIVE  NEGATIVE  URINE MICROSCOPIC-ADD ON     Status: Abnormal   Collection Time    10/31/13  1:40 PM      Result Value Ref Range   Squamous Epithelial / LPF RARE  RARE   WBC, UA 0-2  <3 WBC/hpf   RBC / HPF 0-2  <3 RBC/hpf   Bacteria, UA FEW (*) RARE   Urine-Other MUCOUS PRESENT     Comment: FEW YEAST  MAGNESIUM     Status: None   Collection Time    10/31/13  2:12 PM      Result Value Ref Range   Magnesium 1.9  1.5 - 2.5  mg/dL  COMPREHENSIVE METABOLIC PANEL     Status: Abnormal   Collection Time    11/01/13  6:59 AM      Result Value Ref Range   Sodium 134 (*) 137 - 147 mEq/L   Potassium 3.8  3.7 - 5.3 mEq/L   Comment: DELTA CHECK NOTED   Chloride 98  96 - 112 mEq/L   CO2 25  19 - 32 mEq/L   Glucose, Bld 89  70 - 99 mg/dL   BUN 5 (*) 6 - 23 mg/dL   Creatinine, Ser 0.48 (*) 0.50 - 1.10 mg/dL   Calcium 8.2 (*) 8.4 - 10.5 mg/dL   Total Protein 6.0  6.0 - 8.3 g/dL   Albumin 1.6 (*) 3.5 - 5.2 g/dL   AST 41 (*) 0 - 37 U/L   ALT 14  0 - 35 U/L   Alkaline Phosphatase 248 (*) 39 - 117 U/L   Total Bilirubin 2.3 (*) 0.3 - 1.2 mg/dL   GFR calc non Af Amer >90  >90 mL/min   GFR calc Af Amer >90  >90 mL/min   Comment: (NOTE)     The eGFR has been calculated using the CKD EPI equation.     This calculation has not been validated in all clinical situations.     eGFR's persistently <90 mL/min signify possible Chronic Kidney     Disease.  CBC     Status: Abnormal   Collection Time    11/01/13  6:59 AM      Result Value Ref Range   WBC 7.1  4.0 - 10.5 K/uL    RBC 3.23 (*) 3.87 - 5.11 MIL/uL   Hemoglobin 11.5 (*) 12.0 - 15.0 g/dL   HCT 35.0 (*) 36.0 - 46.0 %   MCV 108.4 (*) 78.0 - 100.0 fL   MCH 35.6 (*) 26.0 - 34.0 pg   MCHC 32.9  30.0 - 36.0 g/dL   RDW 15.7 (*) 11.5 - 15.5 %   Platelets 168  150 - 400 K/uL  PROTIME-INR     Status: Abnormal   Collection Time    11/01/13  6:59 AM      Result Value Ref Range   Prothrombin Time 17.1 (*) 11.6 - 15.2 seconds   INR 1.43  0.00 - 1.49  LACTIC ACID, PLASMA     Status: Abnormal   Collection Time    11/01/13  6:59 AM      Result Value Ref Range   Lactic Acid, Venous 3.0 (*) 0.5 - 2.2 mmol/L    Dg Tibia/fibula Left  10/31/2013   CLINICAL DATA:  Leg pain  EXAM: LEFT TIBIA AND FIBULA - 2 VIEW  COMPARISON:  None.  FINDINGS: Postsurgical changes are noted. No acute bony abnormality is seen. No soft tissue abnormality is noted.  IMPRESSION: No acute abnormality seen.   Electronically Signed   By: Inez Catalina M.D.   On: 10/31/2013 13:46   Ct Abdomen Pelvis W Contrast  10/31/2013   CLINICAL DATA:  Diffuse abdominal pain. Cirrhosis. History of breast cancer.  EXAM: CT ABDOMEN AND PELVIS WITH CONTRAST  TECHNIQUE: Multidetector CT imaging of the abdomen and pelvis was performed using the standard protocol following bolus administration of intravenous contrast.  CONTRAST:  162mL OMNIPAQUE IOHEXOL 300 MG/ML SOLN, 57mL OMNIPAQUE IOHEXOL 300 MG/ML SOLN  COMPARISON:  CT scan dated 10/08/2013  FINDINGS: There is hepatomegaly with diffuse hepatic steatosis. Biliary tree, spleen, pancreas, adrenal glands, and kidneys are normal except for tiny cysts  in both kidneys.  Bowel appears normal. There is a small amount of ascites in the pelvis which is new. There is a stable 2.7 cm cyst on the left ovary. No osseous abnormality. Lung bases are clear.  IMPRESSION: 1. New small amount of ascites in the pelvis since the prior study. 2. Chronic hepatomegaly with hepatic steatosis.   Electronically Signed   By: Rozetta Nunnery M.D.    On: 10/31/2013 16:51   Dg Chest Port 1 View  10/31/2013   CLINICAL DATA:  Abdominal pain  EXAM: PORTABLE CHEST - 1 VIEW  COMPARISON:  2/19/ 15  FINDINGS: Cardiac shadow is stable. The lungs are clear bilaterally. A left chest wall port is again seen and stable. Postsurgical changes are noted on the right.  IMPRESSION: No acute abnormality noted.   Electronically Signed   By: Inez Catalina M.D.   On: 10/31/2013 13:42   Dg Knee Complete 4 Views Right  10/31/2013   CLINICAL DATA:  Leg pain  EXAM: RIGHT KNEE - COMPLETE 4+ VIEW  COMPARISON:  None.  FINDINGS: There are changes consistent with prior medullary rod placement. There is an oblique fracture identified through the distal metaphysis extending inferiorly around the orthopedic hardware. It has a different appearance than that seen on the prior exam and may represent an acute fracture. Some callus formation is noted  IMPRESSION: Changes consistent with prior surgical fixation. There are changes suggestive of an acute fracture in the distal femoral metaphysis. Correlation with any recent trauma is recommended.   Electronically Signed   By: Inez Catalina M.D.   On: 10/31/2013 13:45   Dg Foot Complete Left  10/31/2013   CLINICAL DATA:  Left foot pain  EXAM: LEFT FOOT - COMPLETE 3+ VIEW  COMPARISON:  None.  FINDINGS: Postsurgical changes are noted in the distal fibula. No acute bony abnormality is noted.  IMPRESSION: No acute abnormality seen.   Electronically Signed   By: Inez Catalina M.D.   On: 10/31/2013 13:42    Review of Systems  HENT: Negative.    Blood pressure 112/60, pulse 121, temperature 98.4 F (36.9 C), temperature source Oral, resp. rate 20, height $RemoveBe'4\' 11"'qupKPtnVn$  (1.499 m), weight 81.1 kg (178 lb 12.7 oz), SpO2 100.00%. Physical Exam BP 112/60  Pulse 121  Temp(Src) 98.4 F (36.9 C) (Oral)  Resp 20  Ht $R'4\' 11"'nV$  (1.499 m)  Wt 81.1 kg (178 lb 12.7 oz)  BMI 36.09 kg/m2  SpO2 100% Upper extremity exam  The right and left upper  extremity:   Inspection and palpation revealed no abnormalities in the upper extremities.   Range of motion is full without contracture.  Motor exam is normal with grade 5 strength.  The joints are fully reduced without subluxation.  There is no atrophy or tremor and muscle tone is normal.  All joints are stable.  Skin is without open sores or rash   Right lower extremity:  Inspection: Multiple lacerations over the right knee consistent with previous femoral fixation, the patient's husband also reports a laceration which was repaired after femoral fixation. Palpation reveals tenderness over the knee joint thigh ankle foot and is completely unreliable as the patient is saying everything hurts. There is no acute bleeding in the subcutaneous tissue, the knee joint is not swollen. There is some question of whether the patient dropped in the perioperative period, it is unclear if she is having pain in the knee or not and it is also unclear when it started if there  is pain there. She was given the okay to weight-bear with a walker in December of 2014 I. the operating physician. No followup visit has occurred since then secondary to a fracture of the left ankle. We were unable to range the knee were checked stability of the joint is reduced without subluxation. No tremor or atrophy noted muscle tone is normal.  Left lower extremity:  Medial and lateral ankle incisions from recent ankle internal fixation are nontender. The foot is held in plantar flexion and inversion supination; passively this is correctable except for the plantar flexion. Some correction is noted with flexion of the knee joint, indicating a gastroc contracture. She also complains of medial and lateral tenderness in the ankle joint. No effusion. Muscle tone is normal without atrophy. The skin incisions have healed well. No evidence of joint subluxation or dislocation  Assessment/Plan:  #1 recent open treatment internal fixation left  ankle. The patient was placed in a boot within the last 2 weeks. However the boot is at home. I do not think she can get the foot into a boot because she cannot get neutral dorsiflexion so we will place her in an Aircast and alert the operating surgeon Dr. Luna Glasgow  #2 questionable refracture around the implant of the right femur. Those x-rays show one of the screws to be external to the nail but not causing any problem at this point. The x-rays that were done at surgery show that this is possibly the same as it was at the time of surgery.  The patient will be managed in a knee immobilizer she's not a surgical candidate at this point if the nail was causing any issues. I believe this can be managed nonoperatively. If she does need further surgical intervention regarding the nail we can alert the operating surgeon Dr. Rhona Raider once she is medically stabilized.  Arther Abbott 11/01/2013, 9:43 AM

## 2013-11-01 NOTE — Consult Note (Signed)
Referring Provider: Dr. Roderic Palau Primary Care Physician:  Yevette Edwards, NP Primary Gastroenterologist:  Dr. Gala Romney   Date of Admission: 10/31/13 Date of Consultation: 11/01/13  Reason for Consultation:  GI bleed  HPI:  Elizabeth Orr is a 53 year old female last seen by our practice in Jan 2014. She has a remote history of PUD and stricture s/p dilation. Her last colonoscopy and EGD were actually in Jan 2014 with  internal hemorrhoids, friable anal canal, left-sided diverticulosis. EGD with question of eosinophilic esophagitis but NEGATIVE path. Dilation peformed with scope passage alone. It appears that at some point, she was given the diagnosis of ETOH cirrhosis. Poor historian. CT this admission with small amount of ascites in pelvis, chronic hepatomegaly with hepatic steatosis. [place] of abdomen in Feb 2015 called likely cirrhosis/steatosis.   Does not recall why she was admitted. States she had upper abdominal pain. Doesn't feel like this was the biggest issue. Thinks evaluation has been "overdone" as far as taking care of her. Wants to go home. Feels there is no reason she can't go home. States upper abdominal pain started when she was brought to the hospital. Had been on Lovenox shots after left ORIF on Sep 15, 2013. Doesn't know if she had any rectal bleeding. States she has been noting black/tarry stool in past few days. Denies NSAIDs. States pain is much improved. Main complaint of leg pain. No N/V, although this is not consistent with notes from admission. ED notes deny any hematemesis or hematochezia. No dysphagia. Denies constipation or diarrhea. HEME POSITIVE in ED.   She denies drinking any ETOH; however, review of epic notes reveal she was consuming alcohol even before ankle fracture. However, she states "I'm not an alcoholic, just when going out to dinner". Denies routine ETOH use currently.   Confused: does not know what year it is. Stated it was 2004. Upon review of chart, appears  this may be at her baseline. Ammonia level normal on admission. Lactic acid elevated at 6.4 on admission, now 3.   Past Medical History  Diagnosis Date  . Kidney stones 1989  . GERD (gastroesophageal reflux disease)   . History of stomach ulcers 2003/2007    Hx of esophageal ulcers and stomach ulcers due to reflux  . Back pain   . Chronic leg pain   . Polysubstance abuse   . Depression   . Anxiety   . Exertional shortness of breath   . History of blood transfusion     "recently had my 2nd; related to chemo" (01/21/2013)  . [id number])     "monthly" (01/21/2013)  . Hepatic steatosis 09/26/2013  . Anemia, iron deficiency 09/26/2013  . Macrocytosis 09/26/2013  . Alcoholism 09/27/2013    In recovery  . Alcoholic cirrhosis of liver 09/28/2013  . New onset seizure 10/08/2013  . Breast cancer 06/08/12    right mastectomy  . Infiltrating ductal carcinoma of breast 07/09/2012    [place]:  chemo    Past Surgical History  Procedure Laterality Date  . Esophagogastroduodenoscopy    . Esophageal dilation    . Portacath placement  07/22/2012    Procedure: INSERTION PORT-A-CATH;  Surgeon: Donato Heinz, MD;  Location: [facility];  Service: General;  Laterality: Left;  left subclavian  . Esophagogastroduodenoscopy (egd) with esophageal dilation  10/04/2005    Hershey Knauer-Extensive geographic ulcerations distal third of the tubular esoophagus consistent with severe ulcerative relux esophagitis, early stricture formation status post dilation as described above. 2.  Multiple gastric ulcers  without bleeding stigmata as described above. otherwise normal stomach.  3. large bulbar ulcer without bleeding stigmata. otherwise D1 and D2 appeared normal.  . Colonoscopy, esophagogastroduodenoscopy (egd) and esophageal dilation  08/21/2012    Dr. Gala Romney: colonoscopy with internal hemorrhoids, friable anal canal, left-sided diverticulosis. EGD with question of eosinophilic esophagitis but NEGATIVE path. Dilation peformed with  scope passage alone  . Breast biopsy Right 04/2012  . Mastectomy Right 06/08/12  . Tubal ligation  1989  . Femur im nail Right 01/21/2013    Procedure: INTRAMEDULLARY (IM) RETROGRADE FEMORAL NAILING;  Surgeon: Hessie Dibble, MD;  Location: [place];  Service: Orthopedics;  Laterality: Right;  . Orif ankle fracture Left 09/15/2013    Procedure: OPEN REDUCTION INTERNAL FIXATION (ORIF) LEFTANKLE FRACTURE;  Surgeon: Sanjuana Kava, MD;  Location: [facility];  Service: Orthopedics;  Laterality: Left;    Prior to Admission medications   Medication Sig Start Date End Date Taking? Authorizing Provider  albuterol (PROVENTIL HFA;VENTOLIN HFA) 108 (90 BASE) MCG/ACT inhaler Inhale 2 puffs into the lungs every 6 (six) hours as needed for wheezing. 09/28/13  Yes Rexene Alberts, MD  aspirin EC 81 MG EC tablet Take 1 tablet (81 mg total) by mouth daily. 09/28/13  Yes Rexene Alberts, MD  docusate sodium (COLACE) 100 MG capsule Take 100 mg by mouth 2 (two) times daily.   Yes Historical Provider, MD  enoxaparin (LOVENOX) 40 MG/0.4ML injection Inject 0.4 mLs (40 mg total) into the skin daily. 10/11/13  Yes Samuella Cota, MD  furosemide (LASIX) 40 MG tablet Take 1 tablet (40 mg total) by mouth daily. Take for swelling. 09/28/13  Yes Rexene Alberts, MD  gabapentin (NEURONTIN) 300 MG capsule Take 3 capsules (900 mg total) by mouth 2 (two) times daily. 09/28/13  Yes Rexene Alberts, MD  iron polysaccharides (NIFEREX) 150 MG capsule Take 1 capsule (150 mg total) by mouth daily. 09/28/13  Yes Rexene Alberts, MD  magnesium hydroxide (MILK OF MAGNESIA) 400 MG/5ML suspension Take 30 mLs by mouth daily as needed for constipation.   Yes Historical Provider, MD  Multiple Vitamins-Iron (MULTIVITAMINS WITH IRON) TABS tablet Take 1 tablet by mouth daily. 09/28/13  Yes Rexene Alberts, MD  omeprazole (PRILOSEC) 20 MG capsule Take 1 capsule (20 mg total) by mouth daily. 09/28/13  Yes Rexene Alberts, MD  oxyCODONE-acetaminophen (PERCOCET/ROXICET) 5-325  MG per tablet Take 1 tablet by mouth every 12 (twelve) hours as needed for moderate pain or severe pain. 10/11/13  Yes Samuella Cota, MD  polyethylene glycol [facility] [place]) packet Take 17 g by mouth daily.   Yes Historical Provider, MD  potassium chloride SA (K-DUR,KLOR-CON) 20 MEQ tablet Take 1 tablet (20 mEq total) by mouth daily. 09/28/13  Yes Rexene Alberts, MD  spironolactone (ALDACTONE) 25 MG tablet Take 1 tablet (25 mg total) by mouth daily. For generalized swelling. 09/28/13  Yes Rexene Alberts, MD  thiamine 100 MG tablet Take 1 tablet (100 mg total) by mouth daily. 09/28/13  Yes Rexene Alberts, MD  Vitamin D, Ergocalciferol, (DRISDOL) 50000 UNITS CAPS capsule Take 50,000 Units by mouth every 7 (seven) days.   Yes Historical Provider, MD  ciprofloxacin (CIPRO) 500 MG tablet Take 500 mg by mouth 2 (two) times daily.    Historical Provider, MD    Current Facility-Administered Medications  Medication Dose Route Frequency Provider Last Rate Last Dose  . 0.9 % NaCl with KCl 40 mEq / L  infusion   Intravenous Continuous Kathie Dike, MD 75 mL/hr at 10/31/13 2116    .  acetaminophen (TYLENOL) tablet 650 mg  650 mg Oral Q6H PRN Kathie Dike, MD   650 mg at 10/31/13 2126   Or  . acetaminophen (TYLENOL) suppository 650 mg  650 mg Rectal Q6H PRN Kathie Dike, MD      . cefTRIAXone (ROCEPHIN) 1 g in dextrose 5 % 50 mL IVPB  1 g Intravenous Q24H Kathie Dike, MD   1 g at 10/31/13 2126  . morphine 2 MG/ML injection 2 mg  2 mg Intravenous Q4H PRN Kathie Dike, MD   2 mg at 11/01/13 0401  . ondansetron (ZOFRAN) tablet 4 mg  4 mg Oral Q6H PRN Kathie Dike, MD       Or  . ondansetron (ZOFRAN) injection 4 mg  4 mg Intravenous Q6H PRN Kathie Dike, MD      . pantoprazole (PROTONIX) injection 40 mg  40 mg Intravenous Q12H Kathie Dike, MD   40 mg at 10/31/13 2126  . sodium chloride 0.9 % injection 3 mL  3 mL Intravenous Q12H Kathie Dike, MD   3 mL at 10/31/13 2126    Facility-Administered Medications Ordered in Other Encounters  Medication Dose Route Frequency Provider Last Rate Last Dose  . 0.9 % irrigation (POUR BTL)    PRN Donato Heinz, MD   1,000 mL at 06/02/12 1056  . bupivacaine (MARCAINE) 0.5 % injection    PRN Donato Heinz, MD   10 mL at 06/02/12 1056    Allergies as of 10/31/2013 - Review Complete 10/31/2013  Allergen Reaction Noted  . Citalopram  10/28/2012  . Effexor [venlafaxine hcl]  10/28/2012  . Motrin [ibuprofen] Swelling 05/26/2012  . Trazodone and nefazodone  10/28/2012    Family History  Problem Relation Age of Onset  . Heart attack Father   . COPD Father   . Alcohol abuse Brother   . Alcohol abuse Brother   . Diabetes Mother   . Colon cancer Neg Hx     History   Social History  . Marital Status: Married    Spouse Name: N/A    Number of Children: 6  . Years of Education: N/A   Occupational History  . unemployed    Social History Main Topics  . Smoking status: Current Some Day Smoker -- 0.25 packs/day for 26 years    Last Attempt to Quit: 08/07/2011  . Smokeless tobacco: Never Used  . Alcohol Use: No     Comment: Patient denies current ETOH use but notes reveal +ETOH abuse in recent past  . Drug Use: No  . Sexual Activity: Not Currently    Birth Control/ Protection: Post-menopausal   Other Topics Concern  . Not on file   Social History Narrative   Lives w/ husband, son, daughter-in-law & 3 grandkids    Review of Systems: Unable to obtain completely due to patient's mild confusion.   Physical Exam: Vital signs in last 24 hours: Temp:  [97.4 F (36.3 C)-98.5 F (36.9 C)] 98.4 F (36.9 C) (03/16 0300) Pulse Rate:  [103-125] 121 (03/16 0300) Resp:  [14-25] 20 (03/16 0300) BP: (112-127)/(54-97) 112/60 mmHg (03/16 0300) SpO2:  [97 %-100 %] 100 % (03/16 0300) Weight:  [178 lb 12.7 oz (81.1 kg)-180 lb (81.647 kg)] 178 lb 12.7 oz (81.1 kg) (03/15 1822) Last BM Date: 10/31/13 General:   Alert to  person and place. Intermittent "crying" but when redirected immediately stops. Head:  Normocephalic and atraumatic. Eyes:  Sclera clear, mild icterus Ears:  Normal auditory acuity. Nose:  No  deformity, discharge,  or lesions. Mouth:  Poor dentition Neck:  Supple; no masses or thyromegaly. Lungs:  Clear throughout to auscultation.    Heart:  S1 S2 present, no appreciable murmur Abdomen:  Soft, nontender when patient distracted and nondistended. +BS, obese. Hepatomegaly with liver margin palpable 2-3 fingerbreadths below right costal margin Rectal:  Deferred  Extremities:  LLE with slightly more ankle edema than right, site of ORIF, incision well-healed. Neurologic:  Alert and  oriented to person and place only. Cervical Nodes:  No significant cervical adenopathy. Psych:  Alert and cooperative. Intermittent episodes of crying but quickly abates with redirection.   Intake/Output from previous day:   Intake/Output this shift:    Lab Results:  Recent Labs  10/31/13 1241 11/01/13 0659  WBC 8.6 7.1  HGB 12.3 11.5*  HCT 36.9 35.0*  PLT 172 168   BMET  Recent Labs  10/31/13 1241 11/01/13 0659  NA 133* 134*  K 2.9* 3.8  CL 89* 98  CO2 28 25  GLUCOSE 108* 89  BUN 6 5*  CREATININE 0.51 0.48*  CALCIUM 8.7 8.2*   LFT  Recent Labs  10/31/13 1241 11/01/13 0659  PROT 6.5 6.0  ALBUMIN 1.7* 1.6*  AST 45* 41*  ALT 16 14  ALKPHOS 269* 248*  BILITOT 2.6* 2.3*   PT/INR  Recent Labs  10/31/13 1241 11/01/13 0659  LABPROT 16.2* 17.1*  INR 1.33 1.43   Studies/Results: Dg Tibia/fibula Left  10/31/2013   CLINICAL DATA:  Leg pain  EXAM: LEFT TIBIA AND FIBULA - 2 VIEW  COMPARISON:  None.  FINDINGS: Postsurgical changes are noted. No acute bony abnormality is seen. No soft tissue abnormality is noted.  IMPRESSION: No acute abnormality seen.   Electronically Signed   By: Inez Catalina M.D.   On: 10/31/2013 13:46   Ct Abdomen Pelvis W Contrast  10/31/2013   CLINICAL DATA:   Diffuse abdominal pain. Cirrhosis. History of breast cancer.  EXAM: CT ABDOMEN AND PELVIS WITH CONTRAST  TECHNIQUE: Multidetector CT imaging of the abdomen and pelvis was performed using the standard protocol following bolus administration of intravenous contrast.  CONTRAST:  110mL OMNIPAQUE IOHEXOL 300 MG/ML SOLN, 3mL OMNIPAQUE IOHEXOL 300 MG/ML SOLN  COMPARISON:  CT scan dated 10/08/2013  FINDINGS: There is hepatomegaly with diffuse hepatic steatosis. Biliary tree, spleen, pancreas, adrenal glands, and kidneys are normal except for tiny cysts in both kidneys.  Bowel appears normal. There is a small amount of ascites in the pelvis which is new. There is a stable 2.7 cm cyst on the left ovary. No osseous abnormality. Lung bases are clear.  IMPRESSION: 1. New small amount of ascites in the pelvis since the prior study. 2. Chronic hepatomegaly with hepatic steatosis.   Electronically Signed   By: Rozetta Nunnery M.D.   On: 10/31/2013 16:51   Dg Chest Port 1 View  10/31/2013   CLINICAL DATA:  Abdominal pain  EXAM: PORTABLE CHEST - 1 VIEW  COMPARISON:  2/19/ 15  FINDINGS: Cardiac shadow is stable. The lungs are clear bilaterally. A left chest wall port is again seen and stable. Postsurgical changes are noted on the right.  IMPRESSION: No acute abnormality noted.   Electronically Signed   By: Inez Catalina M.D.   On: 10/31/2013 13:42   Dg Knee Complete 4 Views Right  10/31/2013   CLINICAL DATA:  Leg pain  EXAM: RIGHT KNEE - COMPLETE 4+ VIEW  COMPARISON:  None.  FINDINGS: There are changes consistent with prior medullary  rod placement. There is an oblique fracture identified through the distal metaphysis extending inferiorly around the orthopedic hardware. It has a different appearance than that seen on the prior exam and may represent an acute fracture. Some callus formation is noted  IMPRESSION: Changes consistent with prior surgical fixation. There are changes suggestive of an acute fracture in the distal femoral  metaphysis. Correlation with any recent trauma is recommended.   Electronically Signed   By: Inez Catalina M.D.   On: 10/31/2013 13:45   Dg Foot Complete Left  10/31/2013   CLINICAL DATA:  Left foot pain  EXAM: LEFT FOOT - COMPLETE 3+ VIEW  COMPARISON:  None.  FINDINGS: Postsurgical changes are noted in the distal fibula. No acute bony abnormality is noted.  IMPRESSION: No acute abnormality seen.   Electronically Signed   By: Inez Catalina M.D.   On: 10/31/2013 13:42    Impression: 53 year old female, poor historian, admitted with dyspepsia and reported melena, heme positive stool in the setting of daily Lovenox after orthopedic procedure in Jan 2015 (Dr. Aline Brochure). Hgb 11.5, which is only marginally decreased from admission; in fact, she appears to have chronic anemia upon review of chart. She actually is not sure why she was brought to the hospital and desires to go home; quite tearful at times but easily redirected and distracted. 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 Adamantly denies any recent ETOH use and continues to request pain medication. Question an element of drug-seeking as well. Doubt dealing with ischemic process; patient does not appear acutely ill and abdominal exam is benign. . More likely dealing with upper GI etiology in the setting of Lovenox, with last dosing on 3/15; her last EGD was Jan 2014 without evidence of ulceration, portal gastropathy, or varices.   Elevated LFTs: similar to baseline. Ammonia level normal 3/15. Would consider further work-up for cirrhosis as outpatient to exclude other occult issues.   Elevated lactic acid: improved since admission. No signs of ischemia on CT. Discussed with Dr. Burt Knack in radiology, who states SMA, IMA, and celiac are patent with low concern for ischemic process.   Confusion: likely multifactorial in setting of ETOH  abuse, narcotic use, appears to be chronic per review of chart.   She is willing to proceed with an EGD this admission; risks and benefits discussed in detail with patient. She states understanding.  Plan: Remain NPO Agree with Protonix IV BID Discuss timing of EGD with Dr. Gala Romney, possibly this afternoon; remain NPO for now Further work-up of cirrhosis as outpatient Recheck ammonia level Further recommendations to follow.  Orvil Feil, ANP-BC [facility]     LOS: 1 day    11/01/2013, 9:05 AM   Addendum at 1240: Proceed with EGD only. Phenergan 25 mg IV on call due to hx of ETOH cirrhosis, chronic narcotics.  Orvil Feil, ANP-BC [facility]    Attending note:  Patient seen and examined in the short stay center. Agree with need for EGD. Patient agreeable. The risks, benefits, limitations, alternatives and imponderables have been reviewed with the patient. Potential for  biopsy, etc. have also been reviewed.  Questions have been answered. All parties agreeable.

## 2013-11-01 NOTE — Care Management Note (Addendum)
    Page 1 of 1   11/04/2013     3:29:00 PM   CARE MANAGEMENT NOTE 11/04/2013  Patient:  Elizabeth Orr, Elizabeth Orr   Account Number:  [id number]  Date Initiated:  11/01/2013  Documentation initiated by:  Claretha Cooper  Subjective/Objective Assessment:   Pt admitted from home where she lives her spouse. Spouse is her caregiver. Has hospital bed and lift.     Action/Plan:   Anticipated DC Date:  11/04/2013   Anticipated DC Plan:  [place]  CM consult      Choice offered to / List presented to:          [facility] arranged  HH-1 RN  [place].   Status of service:  Completed, signed off Medicare Important Message given?   (If response is "NO", the following Medicare IM given date fields will be blank) Date Medicare IM given:   Date Additional Medicare IM given:    Discharge Disposition:  [place]  Per UR Regulation:    If discussed at Long Length of Stay Meetings, dates discussed:    Comments:  11/04/13 Claretha Cooper RN BSN CM   11/02/13 Hubbard Seldon RN BSN CM Anticipate transfer to [employer].  11/01/13 Shaan Rhoads RN BSN CM

## 2013-11-01 NOTE — Progress Notes (Signed)
Patient pulled IV out twice, was able to save the second one without restarting a new one. Patient continues to scream and holler so loud other patient on the opposite end of the hall can hear her as well as the nurses station while her door is closed. Patient constantly complains of pain with no relief even after receiving pain med with 30 min to an hour. Patient is still now screaming top her her lungs for Jesus and help for pain. Elmo Putt RN

## 2013-11-01 NOTE — Progress Notes (Signed)
Utilization Review Complete  

## 2013-11-01 NOTE — Op Note (Signed)
[facility] [address] [place], [address]   ENDOSCOPY PROCEDURE REPORT  PATIENT: Elizabeth Orr, Elizabeth Orr  MR#: [id number] BIRTHDATE: Jun 25, 1961 , 53  yrs. old GENDER: Female ENDOSCOPIST: R.  Garfield Cornea, MD FACP FACG REFERRED BY:  Yevette Edwards, NP PROCEDURE DATE:  11/01/2013 PROCEDURE:     Diagnostic EGD  INDICATIONS:     melena by history; modest decline in hemoglobin  INFORMED CONSENT:   The risks, benefits, limitations, alternatives and imponderables have been discussed.  The potential for biopsy, esophogeal dilation, etc. have also been reviewed.  Questions have been answered.  All parties agreeable.  Please see the history and physical in the medical record for more information.  MEDICATIONS:   Versed 2 mg IV and Demerol 25 mg IV in divided doses. Phenergan 25 mg IV. Xylocaine gel orally  DESCRIPTION OF PROCEDURE:   The [id number]-[id number] ([id number])  endoscope was introduced through the mouth and advanced to the second portion of the duodenum without difficulty or limitations.  The mucosal surfaces were surveyed very carefully during advancement of the scope and upon withdrawal.  Retroflexion view of the proximal stomach and esophagogastric junction was performed.      FINDINGS: Diffusely narrowed  tubular esophagus. multiple areas of what appeared to be incomplete "rings".  mucosa tore  superficially with minimal scope trauma. Patient heaved during the procedure which produced more superficial tears. Stomach empty.  5 cm hiatal hernia. Gastric mucosa appeared normal. Patent pylorus. Normal first and second portion of the duodenum. No esophageal or gastric varices. No portal gastropathy.   THERAPEUTIC / DIAGNOSTIC MANEUVERS PERFORMED:  None   COMPLICATIONS:  None  IMPRESSION:  Narrowed, somewhat "ringed" appearing esophagus with marked fragility as described above. Endoscopic findings suspicious for eosinophilic esophagitis (biopsies previously negative).  No biopsies today because of pre-existing GI bleed and marked fragility of the esophagus.  Hiatal hernia  RECOMMENDATIONS:  clear liquid diet. Continue twice a day PPI therapy. Add Carafate suspension. Follow H&H. May need further elective, outpatient evaluation of her esophagus.    _______________________________ R. Garfield Cornea, MD FACP [facility] eSigned:  R. Garfield Cornea, MD FACP [facility] 11/01/2013 4:45 PM     CC:  PATIENT NAME:  Elizabeth Orr, Elizabeth Orr MR#: [id number]

## 2013-11-01 NOTE — Progress Notes (Signed)
[facility] PROGRESS NOTE  Elizabeth Orr DGL:[id number] DOB: [birth year] DOA: 10/31/2013 PCP: Yevette Edwards, NP  Assessment/Plan: 1. GI bleeding. Patient underwent endoscopy today with results as below. Recommendations are for proton pump inhibitors and probable further workup for eosinophilic esophagitis as an outpatient. She's also been started on Carafate. Monitor hemoglobin for further decline 2. 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 Follow with GI for further workup. 3. Hypokalemia. Improved 4. Questionable fracture around the right knee. Patient has been seen by Dr. Aline Brochure. Her knee was put in an immobilizer and he does not feel that she is a surgical candidate at this point. 5. Elevated lactic acid. Trending down with IV fluids. 6. Possible urinary tract infection. Nitrites positive in the urine. Followup urine culture. She's currently on Rocephin. 7. Confusion. Appears to be chronic process. Per husband, her mental status is at her baseline.  Code Status: DO NOT RESUSCITATE Family Communication: No family at the bedside today Disposition Plan: Likely discharge home when improved   Consultants:  Gastroenterology  Orthopedics  Procedures: EGD 3/16: Narrowed, somewhat "ringed" appearing esophagus with  marked fragility as described above. Endoscopic findings suspicious  for eosinophilic esophagitis (biopsies previously negative). No  biopsies today because of pre-existing GI bleed and marked  fragility of the esophagus. Hiatal hernia    Antibiotics:  Rocephin 3/15  HPI/Subjective: Patient is seen in room post endoscopy. She does not have any complaints at this time.  Objective: Filed Vitals:   11/01/13 1706  BP: 128/88  Pulse: 108  Temp: 98.5 F (36.9 C)  Resp: 30    Intake/Output Summary (Last 24 hours) at 11/01/13 1912 Last data filed at 11/01/13 1810  Gross per 24 hour  Intake     59 ml  Output      0 ml  Net     59 ml   Filed  Weights   10/31/13 1213 10/31/13 1640 10/31/13 1822  Weight: 81.647 kg (180 lb) 81.1 kg (178 lb 12.7 oz) 81.1 kg (178 lb 12.7 oz)    Exam:   General:  No acute distress  Cardiovascular: S1, S2, tachycardic  Respiratory: Clear to auscultation bilaterally  Abdomen: Soft, nontender positive bowel sounds  Musculoskeletal: Trace pedal edema bilaterally   Data Reviewed: Basic Metabolic Panel:  Recent Labs Lab 10/28/13 1102 10/31/13 1241 10/31/13 1412 11/01/13 0659  NA 132* 133*  --  134*  K 4.3 2.9*  --  3.8  CL 93* 89*  --  98  CO2 23 28  --  25  GLUCOSE 97 108*  --  89  BUN 6 6  --  5*  CREATININE 0.63 0.51  --  0.48*  CALCIUM 9.0 8.7  --  8.2*  MG  --   --  1.9  --    Liver Function Tests:  Recent Labs Lab 10/28/13 1102 10/31/13 1241 11/01/13 0659  AST 31 45* 41*  ALT 16 16 14   ALKPHOS 262* 269* 248*  BILITOT 3.0* 2.6* 2.3*  PROT 6.8 6.5 6.0  ALBUMIN 1.8* 1.7* 1.6*   No results found for this basename: LIPASE, AMYLASE,  in the last 168 hours  Recent Labs Lab 10/31/13 1244 11/01/13 1127  AMMONIA 37 43   CBC:  Recent Labs Lab 10/28/13 1102 10/31/13 1241 11/01/13 0659  WBC 11.7* 8.6 7.1  NEUTROABS 9.1* 6.2  --   HGB 12.8 12.3 11.5*  HCT 40.8 36.9 35.0*  MCV 113.3* 108.2* 108.4*  PLT 203 172 168  Cardiac Enzymes: No results found for this basename: CKTOTAL, CKMB, CKMBINDEX, TROPONINI,  in the last 168 hours BNP (last 3 results)  Recent Labs  09/25/13 1656  PROBNP 1957.0*   CBG:  Recent Labs Lab 10/31/13 1227  GLUCAP 94    No results found for this or any previous visit (from the past 240 hour(s)).   Studies: Dg Tibia/fibula Left  10/31/2013   CLINICAL DATA:  Leg pain  EXAM: LEFT TIBIA AND FIBULA - 2 VIEW  COMPARISON:  None.  FINDINGS: Postsurgical changes are noted. No acute bony abnormality is seen. No soft tissue abnormality is noted.  IMPRESSION: No acute abnormality seen.   Electronically Signed   By: Inez Catalina M.D.   On:  10/31/2013 13:46   Ct Abdomen Pelvis W Contrast  10/31/2013   CLINICAL DATA:  Diffuse abdominal pain. Cirrhosis. History of breast cancer.  EXAM: CT ABDOMEN AND PELVIS WITH CONTRAST  TECHNIQUE: Multidetector CT imaging of the abdomen and pelvis was performed using the standard protocol following bolus administration of intravenous contrast.  CONTRAST:  149mL OMNIPAQUE IOHEXOL 300 MG/ML SOLN, 75mL OMNIPAQUE IOHEXOL 300 MG/ML SOLN  COMPARISON:  CT scan dated 10/08/2013  FINDINGS: There is hepatomegaly with diffuse hepatic steatosis. Biliary tree, spleen, pancreas, adrenal glands, and kidneys are normal except for tiny cysts in both kidneys.  Bowel appears normal. There is a small amount of ascites in the pelvis which is new. There is a stable 2.7 cm cyst on the left ovary. No osseous abnormality. Lung bases are clear.  IMPRESSION: 1. New small amount of ascites in the pelvis since the prior study. 2. Chronic hepatomegaly with hepatic steatosis.   Electronically Signed   By: Rozetta Nunnery M.D.   On: 10/31/2013 16:51   Dg Chest Port 1 View  10/31/2013   CLINICAL DATA:  Abdominal pain  EXAM: PORTABLE CHEST - 1 VIEW  COMPARISON:  2/19/ 15  FINDINGS: Cardiac shadow is stable. The lungs are clear bilaterally. A left chest wall port is again seen and stable. Postsurgical changes are noted on the right.  IMPRESSION: No acute abnormality noted.   Electronically Signed   By: Inez Catalina M.D.   On: 10/31/2013 13:42   Dg Knee Complete 4 Views Right  10/31/2013   CLINICAL DATA:  Leg pain  EXAM: RIGHT KNEE - COMPLETE 4+ VIEW  COMPARISON:  None.  FINDINGS: There are changes consistent with prior medullary rod placement. There is an oblique fracture identified through the distal metaphysis extending inferiorly around the orthopedic hardware. It has a different appearance than that seen on the prior exam and may represent an acute fracture. Some callus formation is noted  IMPRESSION: Changes consistent with prior surgical  fixation. There are changes suggestive of an acute fracture in the distal femoral metaphysis. Correlation with any recent trauma is recommended.   Electronically Signed   By: Inez Catalina M.D.   On: 10/31/2013 13:45   Dg Foot Complete Left  10/31/2013   CLINICAL DATA:  Left foot pain  EXAM: LEFT FOOT - COMPLETE 3+ VIEW  COMPARISON:  None.  FINDINGS: Postsurgical changes are noted in the distal fibula. No acute bony abnormality is noted.  IMPRESSION: No acute abnormality seen.   Electronically Signed   By: Inez Catalina M.D.   On: 10/31/2013 13:42    Scheduled Meds: . cefTRIAXone (ROCEPHIN)  IV  1 g Intravenous Q24H  . metoprolol tartrate  25 mg Oral BID  . pantoprazole (PROTONIX) IV  40  mg Intravenous Q12H  . sodium chloride  3 mL Intravenous Q12H  . sucralfate  1 g Oral TID WC & HS   Continuous Infusions: . 0.9 % NaCl with KCl 40 mEq / L 75 mL/hr at 10/31/13 2116    Active Problems:   Hepatic steatosis   Hypoalbuminemia   Alcoholic cirrhosis of liver   Abdominal pain   GI bleed   Hypokalemia   Fracture of knee region    Time spent: 42mins    MEMON,JEHANZEB  [facility] Pager [phone]. If 7PM-7AM, please contact night-coverage at [email or web address], password [facility] 11/01/2013, 7:12 PM  LOS: 1 day

## 2013-11-02 ENCOUNTER — Encounter (HOSPITAL_COMMUNITY): Payer: Self-pay | Admitting: Internal Medicine

## 2013-11-02 DIAGNOSIS — E872 Acidosis, unspecified: Secondary | ICD-10-CM

## 2013-11-02 LAB — CBC
HCT: 31.7 % — ABNORMAL LOW (ref 36.0–46.0)
Hemoglobin: 10.4 g/dL — ABNORMAL LOW (ref 12.0–15.0)
MCH: 36 pg — ABNORMAL HIGH (ref 26.0–34.0)
MCHC: 32.8 g/dL (ref 30.0–36.0)
MCV: 109.7 fL — ABNORMAL HIGH (ref 78.0–100.0)
PLATELETS: 164 10*3/uL (ref 150–400)
RBC: 2.89 MIL/uL — ABNORMAL LOW (ref 3.87–5.11)
RDW: 16.3 % — AB (ref 11.5–15.5)
WBC: 6.8 10*3/uL (ref 4.0–10.5)

## 2013-11-02 LAB — LACTIC ACID, PLASMA: LACTIC ACID, VENOUS: 3.4 mmol/L — AB (ref 0.5–2.2)

## 2013-11-02 LAB — BASIC METABOLIC PANEL
BUN: 5 mg/dL — ABNORMAL LOW (ref 6–23)
CHLORIDE: 102 meq/L (ref 96–112)
CO2: 22 mEq/L (ref 19–32)
Calcium: 8.2 mg/dL — ABNORMAL LOW (ref 8.4–10.5)
Creatinine, Ser: 0.46 mg/dL — ABNORMAL LOW (ref 0.50–1.10)
GFR calc non Af Amer: >90 mL/min (ref >90)
Glucose, Bld: 90 mg/dL (ref 70–99)
POTASSIUM: 4 meq/L (ref 3.7–5.3)
SODIUM: 135 meq/L — AB (ref 137–147)

## 2013-11-02 LAB — URINE CULTURE: Colony Count: 70000

## 2013-11-02 MED ORDER — PANTOPRAZOLE SODIUM 40 MG PO TBEC
40.0000 mg | DELAYED_RELEASE_TABLET | Freq: Two times a day (BID) | ORAL | Status: DC
Start: 2013-11-02 — End: 2013-11-04
  Administered 2013-11-02 – 2013-11-04 (×5): 40 mg via ORAL
  Filled 2013-11-02 (×5): qty 1

## 2013-11-02 NOTE — Progress Notes (Signed)
Subjective: Complains of pain "all over". NO rectal bleeding. Just had BM per nursing staff. No N/V. Wants to go home, then asks for pain medication.   Objective: Vital signs in last 24 hours: Temp:  [98.3 F (36.8 C)-98.5 F (36.9 C)] 98.3 F (36.8 C) (03/17 0637) Pulse Rate:  [96-144] 115 (03/17 0637) Resp:  [14-30] 19 (03/17 0637) BP: (118-151)/(75-101) 131/80 mmHg (03/17 0637) SpO2:  [98 %-100 %] 99 % (03/17 0637) Last BM Date: 11/01/13 General:   Alert and oriented to person, situation, not year.  Lungs: Clear to auscultation bilaterally,  Abdomen:  Bowel sounds present, soft, non-tender, non-distended. Mild hepatomegaly Extremities:  Trace bilateral lower extremity edema, multiple bruising noted LLE. RLE in soft cast Neurologic:  Alert and oriented to person, place, unsure year.  Psych:  Alert and cooperative. Tearful at times.   Intake/Output from previous day: 03/16 0701 - 03/17 0700 In: 299 [P.O.:240; I.V.:59] Out: 276 [Urine:275; Emesis/NG output:1] Intake/Output this shift:    Lab Results:  Recent Labs  10/31/13 1241 11/01/13 0659 11/02/13 0515  WBC 8.6 7.1 6.8  HGB 12.3 11.5* 10.4*  HCT 36.9 35.0* 31.7*  PLT 172 168 164   BMET  Recent Labs  10/31/13 1241 11/01/13 0659 11/02/13 0515  NA 133* 134* 135*  K 2.9* 3.8 4.0  CL 89* 98 102  CO2 28 25 22   GLUCOSE 108* 89 90  BUN 6 5* 5*  CREATININE 0.51 0.48* 0.46*  CALCIUM 8.7 8.2* 8.2*   LFT  Recent Labs  10/31/13 1241 11/01/13 0659  PROT 6.5 6.0  ALBUMIN 1.7* 1.6*  AST 45* 41*  ALT 16 14  ALKPHOS 269* 248*  BILITOT 2.6* 2.3*   PT/INR  Recent Labs  10/31/13 1241 11/01/13 0659  LABPROT 16.2* 17.1*  INR 1.33 1.43     Studies/Results: Dg Tibia/fibula Left  10/31/2013   CLINICAL DATA:  Leg pain  EXAM: LEFT TIBIA AND FIBULA - 2 VIEW  COMPARISON:  None.  FINDINGS: Postsurgical changes are noted. No acute bony abnormality is seen. No soft tissue abnormality is noted.  IMPRESSION: No  acute abnormality seen.   Electronically Signed   By: Inez Catalina M.D.   On: 10/31/2013 13:46   Ct Abdomen Pelvis W Contrast  10/31/2013   CLINICAL DATA:  Diffuse abdominal pain. Cirrhosis. History of breast cancer.  EXAM: CT ABDOMEN AND PELVIS WITH CONTRAST  TECHNIQUE: Multidetector CT imaging of the abdomen and pelvis was performed using the standard protocol following bolus administration of intravenous contrast.  CONTRAST:  156mL OMNIPAQUE IOHEXOL 300 MG/ML SOLN, 38mL OMNIPAQUE IOHEXOL 300 MG/ML SOLN  COMPARISON:  CT scan dated 10/08/2013  FINDINGS: There is hepatomegaly with diffuse hepatic steatosis. Biliary tree, spleen, pancreas, adrenal glands, and kidneys are normal except for tiny cysts in both kidneys.  Bowel appears normal. There is a small amount of ascites in the pelvis which is new. There is a stable 2.7 cm cyst on the left ovary. No osseous abnormality. Lung bases are clear.  IMPRESSION: 1. New small amount of ascites in the pelvis since the prior study. 2. Chronic hepatomegaly with hepatic steatosis.   Electronically Signed   By: Rozetta Nunnery M.D.   On: 10/31/2013 16:51   Dg Chest Port 1 View  10/31/2013   CLINICAL DATA:  Abdominal pain  EXAM: PORTABLE CHEST - 1 VIEW  COMPARISON:  2/19/ 15  FINDINGS: Cardiac shadow is stable. The lungs are clear bilaterally. A left chest wall port is again  seen and stable. Postsurgical changes are noted on the right.  IMPRESSION: No acute abnormality noted.   Electronically Signed   By: Inez Catalina M.D.   On: 10/31/2013 13:42   Dg Knee Complete 4 Views Right  10/31/2013   CLINICAL DATA:  Leg pain  EXAM: RIGHT KNEE - COMPLETE 4+ VIEW  COMPARISON:  None.  FINDINGS: There are changes consistent with prior medullary rod placement. There is an oblique fracture identified through the distal metaphysis extending inferiorly around the orthopedic hardware. It has a different appearance than that seen on the prior exam and may represent an acute fracture. Some  callus formation is noted  IMPRESSION: Changes consistent with prior surgical fixation. There are changes suggestive of an acute fracture in the distal femoral metaphysis. Correlation with any recent trauma is recommended.   Electronically Signed   By: Inez Catalina M.D.   On: 10/31/2013 13:45   Dg Foot Complete Left  10/31/2013   CLINICAL DATA:  Left foot pain  EXAM: LEFT FOOT - COMPLETE 3+ VIEW  COMPARISON:  None.  FINDINGS: Postsurgical changes are noted in the distal fibula. No acute bony abnormality is noted.  IMPRESSION: No acute abnormality seen.   Electronically Signed   By: Inez Catalina M.D.   On: 10/31/2013 13:42    Assessment: 53 year old female admitted with dyspepsia, heme positive stool, possible melena in setting of daily Lovenox after orthopedic procedure, s/p EGD 3/16 with question of eosinophilic esophagitis but no biopsies due to friable esophagus. Outpatient evaluation with repeat EGD recommended in a more elective setting. Clinically improved, no overt signs of GI bleeding.   Possible cirrhosis: etiology likely secondary to ETOH/NASH. Further work-up as outpatient. LFTs overall stable.  Confusion: in setting of historical ETOH abuse, narcotics, chronic. Would consider neurological consult.    Plan: PPI BID Carafate QID short term Advance diet  Cirrhosis outpatient follow-up Outpatient elective EGD Hopeful discharge in near future Consider neurology consult due to confusion  Orvil Feil, ANP-BC [facility] Gastroenterology     LOS: 2 days    11/02/2013, 7:45 AM

## 2013-11-02 NOTE — Progress Notes (Signed)
Patient known to me.  She was seen by Dr. Aline Brochure, who was taking call for me, over this past weekend.  She had more pain in the right knee.  He feels she has a new acute fracture of the right knee at the site of the old fracture.  It could have been a partial union.  X-rays show a change in the distal femur fracture on the right.  She had retrograde nail fixation last June, 2014 by Dr. Rhona Raider.  She had been slowly progressing with this.  I compared x-rays of the right knee in February of this year  to those just taken this weekend.  There does appear to be a new fracture present in the same area as the old fracture.  I called Dr. Rhona Raider yesterday and told him of the changes.  I told him she was admitted here at [facility] secondary to GI bleed and was under work-up for that.  She will need to follow-up with Dr. Rhona Raider. She will remain in a knee immobilizer on the right.  Her left ankle is also post surgery but she is doing well with that and there has been no changes in x-rays on the left ankle.  She has been in and out of the hospital since her ankle procedure for other medical problems and had a short stay at a nursing home.

## 2013-11-02 NOTE — Progress Notes (Signed)
REVIEWED.  

## 2013-11-02 NOTE — Progress Notes (Signed)
[facility] PROGRESS NOTE  Elizabeth Orr BDZ:[id number] DOB: Oct 31, 1960 DOA: 10/31/2013 PCP: Yevette Edwards, NP  Assessment/Plan: 1. GI bleeding. Patient underwent endoscopy today with results as below. Recommendations are for proton pump inhibitors and probable further workup for eosinophilic esophagitis as an outpatient. She's also been started on Carafate. Hemoglobin trending down. Possibly dilutional. No evidence of ongoing bleeding. We'll continue to follow. Advance diet. 2. 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 Follow with GI for further workup. 3. Hypokalemia. Improved 4. Fracture around the right knee. Patient's right knee is in an immobilizer. Dr. Luna Glasgow discussed the case with Dr. Rhona Raider at [facility]. The patient will need to followup with [facility] once she is discharged from the hospital. She will likely need operative management of her fracture. I also discussed the case with Dr. Jerald Kief PA, and recommendations were for outpatient followup 5. Elevated lactic acid. Trending down with IV fluids. Still elevated, continue to trend. 6. Possible urinary tract infection. Nitrites positive in the urine. Urine culture only shows 70,000 colonies of yeast. Discontinue Rocephin 7. Confusion. Appears to be chronic process. Per husband, her mental status is at her baseline.  Code Status: DO NOT RESUSCITATE Family Communication: Discussed with husband Disposition Plan: Husband is unsure whether he can continue caring for the patient at home. He will let us know tomorrow whether to taking the patient home is a possibility.   Consultants:  Gastroenterology  Orthopedics  Procedures: EGD 3/16: Narrowed, somewhat "ringed" appearing esophagus with  marked fragility as described above. Endoscopic findings suspicious  for eosinophilic esophagitis (biopsies previously negative). No  biopsies today because of pre-existing GI bleed and marked  fragility of the esophagus.  Hiatal hernia    Antibiotics:  Rocephin 3/15>>3/17  HPI/Subjective: Patient is feeling better today. Wants to advance diet. No further bleeding.  Objective: Filed Vitals:   11/02/13 1411  BP: 114/72  Pulse: 90  Temp: 98.1 F (36.7 C)  Resp: 18    Intake/Output Summary (Last 24 hours) at 11/02/13 1931 Last data filed at 11/02/13 1300  Gross per 24 hour  Intake    340 ml  Output    276 ml  Net     64 ml   Filed Weights   10/31/13 1213 10/31/13 1640 10/31/13 1822  Weight: 81.647 kg (180 lb) 81.1 kg (178 lb 12.7 oz) 81.1 kg (178 lb 12.7 oz)    Exam:   General:  No acute distress  Cardiovascular: S1, S2, RRR  Respiratory: Clear to auscultation bilaterally  Abdomen: Soft, nontender positive bowel sounds  Musculoskeletal: Trace pedal edema bilaterally   Data Reviewed: Basic Metabolic Panel:  Recent Labs Lab 10/28/13 1102 10/31/13 1241 10/31/13 1412 11/01/13 0659 11/02/13 0515  NA 132* 133*  --  134* 135*  K 4.3 2.9*  --  3.8 4.0  CL 93* 89*  --  98 102  CO2 23 28  --  25 22  GLUCOSE 97 108*  --  89 90  BUN 6 6  --  5* 5*  CREATININE 0.63 0.51  --  0.48* 0.46*  CALCIUM 9.0 8.7  --  8.2* 8.2*  MG  --   --  1.9  --   --    Liver Function Tests:  Recent Labs Lab 10/28/13 1102 10/31/13 1241 11/01/13 0659  AST 31 45* 41*  ALT 16 16 14   ALKPHOS 262* 269* 248*  BILITOT 3.0* 2.6* 2.3*  PROT 6.8 6.5 6.0  ALBUMIN 1.8* 1.7* 1.6*  No results found for this basename: LIPASE, AMYLASE,  in the last 168 hours  Recent Labs Lab 10/31/13 1244 11/01/13 1127  AMMONIA 37 43   CBC:  Recent Labs Lab 10/28/13 1102 10/31/13 1241 11/01/13 0659 11/02/13 0515  WBC 11.7* 8.6 7.1 6.8  NEUTROABS 9.1* 6.2  --   --   HGB 12.8 12.3 11.5* 10.4*  HCT 40.8 36.9 35.0* 31.7*  MCV 113.3* 108.2* 108.4* 109.7*  PLT 203 172 168 164   Cardiac Enzymes: No results found for this basename: CKTOTAL, CKMB, CKMBINDEX, TROPONINI,  in the last 168 hours BNP (last 3  results)  Recent Labs  09/25/13 1656  PROBNP 1957.0*   CBG:  Recent Labs Lab 10/31/13 1227  GLUCAP 94    Recent Results (from the past 240 hour(s))  URINE CULTURE     Status: None   Collection Time    10/31/13  1:40 PM      Result Value Ref Range Status   Specimen Description URINE, CLEAN CATCH   Final   Special Requests NONE   Final   Culture  Setup Time     Final   Value: 10/31/2013 21:17     Performed at [place]     Final   Value: 70,000 COLONIES/ML     Performed at [facility]   Culture     Final   Value: YEAST     Performed at [facility]   Report Status 11/02/2013 FINAL   Final     Studies: No results found.  Scheduled Meds: . cefTRIAXone (ROCEPHIN)  IV  1 g Intravenous Q24H  . metoprolol tartrate  25 mg Oral BID  . pantoprazole  40 mg Oral BID AC  . sodium chloride  3 mL Intravenous Q12H  . sucralfate  1 g Oral TID WC & HS   Continuous Infusions: . 0.9 % NaCl with KCl 40 mEq / L 50 mL/hr at 11/02/13 1824    Active Problems:   Hepatic steatosis   Hypoalbuminemia   Alcoholic cirrhosis of liver   Abdominal pain   GI bleed   Hypokalemia   Fracture of knee region    Time spent: 72mins    MEMON,JEHANZEB  [facility] Pager [phone]. If 7PM-7AM, please contact night-coverage at [email or web address], password [facility] 11/02/2013, 7:31 PM  LOS: 2 days

## 2013-11-02 NOTE — Progress Notes (Signed)
Patient confused and yelling and screaming out. Yelling for help and screaming for her husband while her door is closed. When I came in the room and asked her why she was screaming she said she did not know. I am not siting in the room with the patient hoping to keep her quiet and calm. William Hamburger RN

## 2013-11-02 NOTE — H&P (Signed)
[facility] History and Physical  ZAMARAH QUIST [id number] DOB: [birth year] DOA: 10/31/2013  Referring physician: Dr. Tanna Furry, ER physician PCP: Yevette Edwards, NP   Chief Complaint: Abdominal pain  HPI: KYLER MOLSKI is a 53 y.o. female with a history of alcoholic cirrhosis, recent hip surgery/ankle surgery who is essentially bedbound since these fractures, who has had multiple admissions for various reasons over the past 2 months. She presents to the emergency room today with complaints of abdominal pain. There have been reports the onset of symptoms approximately 3 days ago has progressively gotten worse. He was initially intermittent but appears to be more frequent at this time. He reports that on Thursday, she had a brown-colored stool which was only mildly dark. She did not have any bowel movements on Friday and Saturday. Today, she had charcoal colored stools. She complains of severe abdominal pain which has progressively gotten worse, worse today. Her husband reports that she's been having vomiting as well, but denies any coffee ground emesis or hematemesis. He is unsure whether she's had any hematochezia. She has been taking Lovenox dosing for DVT prophylaxis post orthopedic surgery. He is unsure whether she's she's had any fever. She's not had any sick contacts. Her husband cares for her at home. She was evaluated in the emergency room where CT scan of the abdomen and pelvis did not show any acute findings. Her lactic acid was markedly elevated. There was concern for underlying ischemic colitis versus other GI bleed. 2 the patient's ongoing abdominal symptoms, she's been referred for admission   Review of Systems:  Limited due to patient's mental status.  Past Medical History  Diagnosis Date  . Kidney stones 1989  . GERD (gastroesophageal reflux disease)   . History of stomach ulcers 2003/2007    Hx of esophageal ulcers and stomach ulcers due to reflux  . Back pain   .  Chronic leg pain   . Polysubstance abuse   . Depression   . Anxiety   . Exertional shortness of breath   . History of blood transfusion     "recently had my 2nd; related to chemo" (01/21/2013)  . [id number])     "monthly" (01/21/2013)  . Hepatic steatosis 09/26/2013  . Anemia, iron deficiency 09/26/2013  . Macrocytosis 09/26/2013  . Alcoholism 09/27/2013    In recovery  . Alcoholic cirrhosis of liver 09/28/2013  . New onset seizure 10/08/2013  . Breast cancer 06/08/12    right mastectomy  . Infiltrating ductal carcinoma of breast 07/09/2012    Neijstrom:  chemo   Past Surgical History  Procedure Laterality Date  . Esophagogastroduodenoscopy    . Esophageal dilation    . Portacath placement  07/22/2012    Procedure: INSERTION PORT-A-CATH;  Surgeon: Donato Heinz, MD;  Location: [facility];  Service: General;  Laterality: Left;  left subclavian  . Esophagogastroduodenoscopy (egd) with esophageal dilation  10/04/2005    Rourk-Extensive geographic ulcerations distal third of the tubular esoophagus consistent with severe ulcerative relux esophagitis, early stricture formation status post dilation as described above. 2.  Multiple gastric ulcers without bleeding stigmata as described above. otherwise normal stomach.  3. large bulbar ulcer without bleeding stigmata. otherwise D1 and D2 appeared normal.  . Colonoscopy, esophagogastroduodenoscopy (egd) and esophageal dilation  08/21/2012    Dr. Gala Romney: colonoscopy with internal hemorrhoids, friable anal canal, left-sided diverticulosis. EGD with question of eosinophilic esophagitis but NEGATIVE path. Dilation peformed with scope passage alone  . Breast biopsy Right 04/2012  .  Mastectomy Right 06/08/12  . Tubal ligation  1989  . Femur im nail Right 01/21/2013    Procedure: INTRAMEDULLARY (IM) RETROGRADE FEMORAL NAILING;  Surgeon: Velna Ochs, MD;  Location: [facility];  Service: Orthopedics;  Laterality: Right;  . Orif ankle fracture Left 09/15/2013     Procedure: OPEN REDUCTION INTERNAL FIXATION (ORIF) LEFTANKLE FRACTURE;  Surgeon: Darreld Mclean, MD;  Location: [facility];  Service: Orthopedics;  Laterality: Left;  . Esophagogastroduodenoscopy N/A 11/01/2013    Procedure: ESOPHAGOGASTRODUODENOSCOPY (EGD);  Surgeon: Corbin Ade, MD;  Location: [facility];  Service: Endoscopy;  Laterality: N/A;   Social History:  reports that she has been smoking.  She has never used smokeless tobacco. She reports that she does not drink alcohol or use illicit drugs.  Allergies  Allergen Reactions  . Citalopram     Makes her feel drowsy/sleepy.  . Effexor [Venlafaxine Hcl]     Makes pt feel very bad,will not take again.  . Motrin [Ibuprofen] Swelling    Lips swell  . Trazodone And Nefazodone     Makes pt feel very bad and "hung over"    Family History  Problem Relation Age of Onset  . Heart attack Father   . COPD Father   . Alcohol abuse Brother   . Alcohol abuse Brother   . Diabetes Mother   . Colon cancer Neg Hx      Prior to Admission medications   Medication Sig Start Date End Date Taking? Authorizing Provider  albuterol (PROVENTIL HFA;VENTOLIN HFA) 108 (90 BASE) MCG/ACT inhaler Inhale 2 puffs into the lungs every 6 (six) hours as needed for wheezing. 09/28/13  Yes Elliot Cousin, MD  aspirin EC 81 MG EC tablet Take 1 tablet (81 mg total) by mouth daily. 09/28/13  Yes Elliot Cousin, MD  docusate sodium (COLACE) 100 MG capsule Take 100 mg by mouth 2 (two) times daily.   Yes Historical Provider, MD  enoxaparin (LOVENOX) 40 MG/0.4ML injection Inject 0.4 mLs (40 mg total) into the skin daily. 10/11/13  Yes Standley Brooking, MD  furosemide (LASIX) 40 MG tablet Take 1 tablet (40 mg total) by mouth daily. Take for swelling. 09/28/13  Yes Elliot Cousin, MD  gabapentin (NEURONTIN) 300 MG capsule Take 3 capsules (900 mg total) by mouth 2 (two) times daily. 09/28/13  Yes Elliot Cousin, MD  iron polysaccharides (NIFEREX) 150 MG capsule Take 1 capsule (150 mg  total) by mouth daily. 09/28/13  Yes Elliot Cousin, MD  magnesium hydroxide (MILK OF MAGNESIA) 400 MG/5ML suspension Take 30 mLs by mouth daily as needed for constipation.   Yes Historical Provider, MD  Multiple Vitamins-Iron (MULTIVITAMINS WITH IRON) TABS tablet Take 1 tablet by mouth daily. 09/28/13  Yes Elliot Cousin, MD  omeprazole (PRILOSEC) 20 MG capsule Take 1 capsule (20 mg total) by mouth daily. 09/28/13  Yes Elliot Cousin, MD  oxyCODONE-acetaminophen (PERCOCET/ROXICET) 5-325 MG per tablet Take 1 tablet by mouth every 12 (twelve) hours as needed for moderate pain or severe pain. 10/11/13  Yes Standley Brooking, MD  polyethylene glycol [facility] / GLYCOLAX) packet Take 17 g by mouth daily.   Yes Historical Provider, MD  potassium chloride SA (K-DUR,KLOR-CON) 20 MEQ tablet Take 1 tablet (20 mEq total) by mouth daily. 09/28/13  Yes Elliot Cousin, MD  spironolactone (ALDACTONE) 25 MG tablet Take 1 tablet (25 mg total) by mouth daily. For generalized swelling. 09/28/13  Yes Elliot Cousin, MD  thiamine 100 MG tablet Take 1 tablet (100 mg total)  by mouth daily. 09/28/13  Yes Rexene Alberts, MD  Vitamin D, Ergocalciferol, (DRISDOL) 50000 UNITS CAPS capsule Take 50,000 Units by mouth every 7 (seven) days.   Yes Historical Provider, MD  ciprofloxacin (CIPRO) 500 MG tablet Take 500 mg by mouth 2 (two) times daily.    Historical Provider, MD   Physical Exam: Filed Vitals:   11/02/13 1411  BP: 114/72  Pulse: 90  Temp: 98.1 F (36.7 C)  Resp: 18    BP 114/72  Pulse 90  Temp(Src) 98.1 F (36.7 C) (Oral)  Resp 18  Ht 4\' 11"  (1.499 m)  Wt 81.1 kg (178 lb 12.7 oz)  BMI 36.09 kg/m2  SpO2 98%  General:  Patient is confused, somnolent but wakes up and moans in pain. She answers questions, but does not appear completely reliable her answers. Can be easily distracted while in pain. Eyes: PERRL, normal lids, irises & conjunctiva ENT: grossly normal hearing, lips & tongue. Poor dentition Neck: no LAD,  masses or thyromegaly Cardiovascular: RRR, no m/r/g. Trace lower extremity edema. Telemetry: SR, no arrhythmias  Respiratory: CTA bilaterally, no w/r/r. Normal respiratory effort. Abdomen: soft, nt , obese, positive bowel sounds. Patient did not wince or withdraw on palpation Skin: Scattered spider angiomas Musculoskeletal: Muscle wasting in upper and lower extremities Psychiatric: Lethargic, tearful and moaning  Neurologic: Could not fully assess due to patient's mental status           Labs on Admission:  Basic Metabolic Panel:  Recent Labs Lab 10/28/13 1102 10/31/13 1241 10/31/13 1412 11/01/13 0659 11/02/13 0515  NA 132* 133*  --  134* 135*  K 4.3 2.9*  --  3.8 4.0  CL 93* 89*  --  98 102  CO2 23 28  --  25 22  GLUCOSE 97 108*  --  89 90  BUN 6 6  --  5* 5*  CREATININE 0.63 0.51  --  0.48* 0.46*  CALCIUM 9.0 8.7  --  8.2* 8.2*  MG  --   --  1.9  --   --    Liver Function Tests:  Recent Labs Lab 10/28/13 1102 10/31/13 1241 11/01/13 0659  AST 31 45* 41*  ALT 16 16 14   ALKPHOS 262* 269* 248*  BILITOT 3.0* 2.6* 2.3*  PROT 6.8 6.5 6.0  ALBUMIN 1.8* 1.7* 1.6*   No results found for this basename: LIPASE, AMYLASE,  in the last 168 hours  Recent Labs Lab 10/31/13 1244 11/01/13 1127  AMMONIA 37 43   CBC:  Recent Labs Lab 10/28/13 1102 10/31/13 1241 11/01/13 0659 11/02/13 0515  WBC 11.7* 8.6 7.1 6.8  NEUTROABS 9.1* 6.2  --   --   HGB 12.8 12.3 11.5* 10.4*  HCT 40.8 36.9 35.0* 31.7*  MCV 113.3* 108.2* 108.4* 109.7*  PLT 203 172 168 164   Cardiac Enzymes: No results found for this basename: CKTOTAL, CKMB, CKMBINDEX, TROPONINI,  in the last 168 hours  BNP (last 3 results)  Recent Labs  09/25/13 1656  PROBNP 1957.0*   CBG:  Recent Labs Lab 10/31/13 1227  GLUCAP 94    Radiological Exams on Admission: No results found.  EKG: Independently reviewed. Sinus tachycardia. No acute changes  Assessment/Plan Active Problems:   Hepatic  steatosis   Hypoalbuminemia   Alcoholic cirrhosis of liver   Abdominal pain   GI bleed   Hypokalemia   Fracture of knee region   1. GI bleeding. Her husband reports that she's been having melena. She does have  a history of alcoholic cirrhosis. We'll start the patient on twice a day Protonix. Clear liquids for now n.p.o. after midnight. We'll request a GI consultation in the morning to consider EGD tomorrow. We'll provide pain medication for abdominal pain. Cycle hemoglobins. With severe abdominal pain and elevated lactic acid, there is a concern for underlying ischemic colitis. Although the true severity of her pain is unclear, since she can easily be distracted and symptoms are not quite reproducible when palpated with stethoscope. 2. Alcoholic cirrhosis of liver. Periportal patient has not been drinking alcohol for the last 2 months. Liver function tests are not significantly elevated 3. Hypokalemia. Possibly related to diuretic use at home. We'll replace 4. Fracture of the region. Unclear as to etiology. Has been reports of dyspnea occurred while he was putting her in a hoyer lift and her legs are hanging down. Dr. Aline Brochure has been consulted since he has done her prior surgeries. 5. Hypoalbuminemia, likely related to liver disease. Nutrition consult 6. Possible urinary tract infection. Nitrates positive in the urine. Urine culture has been sent. We'll treat empirically with Rocephin for now. 7. Confusion. Has been reports of this and present for the last 2 months. Ammonia level is normal. Appears to be a chronic process.   Code Status: With her multiple comorbidities, very poor functional status and overall poor quality of life, I recommended DO NOT RESUSCITATE. Patient's husband is agreeable Family Communication: discussed with husband at the bedside Disposition Plan: pending hospital course  Time spent:58mins  [facility] Pager [phone]

## 2013-11-03 DIAGNOSIS — K7460 Unspecified cirrhosis of liver: Secondary | ICD-10-CM

## 2013-11-03 LAB — COMPREHENSIVE METABOLIC PANEL
ALK PHOS: 211 U/L — AB (ref 39–117)
ALT: 13 U/L (ref 0–35)
AST: 39 U/L — ABNORMAL HIGH (ref 0–37)
Albumin: 1.4 g/dL — ABNORMAL LOW (ref 3.5–5.2)
BUN: 5 mg/dL — ABNORMAL LOW (ref 6–23)
CO2: 20 meq/L (ref 19–32)
Calcium: 8 mg/dL — ABNORMAL LOW (ref 8.4–10.5)
Chloride: 105 mEq/L (ref 96–112)
Creatinine, Ser: 0.5 mg/dL (ref 0.50–1.10)
GFR calc non Af Amer: >90 mL/min (ref >90)
GLUCOSE: 90 mg/dL (ref 70–99)
Potassium: 4.2 mEq/L (ref 3.7–5.3)
SODIUM: 136 meq/L — AB (ref 137–147)
Total Bilirubin: 1.8 mg/dL — ABNORMAL HIGH (ref 0.3–1.2)
Total Protein: 5.1 g/dL — ABNORMAL LOW (ref 6.0–8.3)

## 2013-11-03 LAB — CBC
HCT: 31.3 % — ABNORMAL LOW (ref 36.0–46.0)
HEMOGLOBIN: 10.2 g/dL — AB (ref 12.0–15.0)
MCH: 36.3 pg — ABNORMAL HIGH (ref 26.0–34.0)
MCHC: 32.6 g/dL (ref 30.0–36.0)
MCV: 111.4 fL — ABNORMAL HIGH (ref 78.0–100.0)
Platelets: 161 10*3/uL (ref 150–400)
RBC: 2.81 MIL/uL — AB (ref 3.87–5.11)
RDW: 16.4 % — ABNORMAL HIGH (ref 11.5–15.5)
WBC: 5.2 10*3/uL (ref 4.0–10.5)

## 2013-11-03 LAB — LACTIC ACID, PLASMA: Lactic Acid, Venous: 2.9 mmol/L — ABNORMAL HIGH (ref 0.5–2.2)

## 2013-11-03 MED ORDER — FUROSEMIDE 40 MG PO TABS
40.0000 mg | ORAL_TABLET | Freq: Every day | ORAL | Status: DC
  Administered 2013-11-04: 40 mg via ORAL
  Filled 2013-11-03: qty 1

## 2013-11-03 MED ORDER — DOCUSATE SODIUM 100 MG PO CAPS
100.0000 mg | ORAL_CAPSULE | Freq: Two times a day (BID) | ORAL | Status: DC
  Administered 2013-11-03 – 2013-11-04 (×3): 100 mg via ORAL
  Filled 2013-11-03 (×3): qty 1

## 2013-11-03 MED ORDER — SPIRONOLACTONE 25 MG PO TABS
25.0000 mg | ORAL_TABLET | Freq: Every day | ORAL | Status: DC
  Administered 2013-11-04: 25 mg via ORAL
  Filled 2013-11-03: qty 1

## 2013-11-03 MED ORDER — GABAPENTIN 300 MG PO CAPS
900.0000 mg | ORAL_CAPSULE | Freq: Two times a day (BID) | ORAL | Status: DC
  Administered 2013-11-03 – 2013-11-04 (×3): 900 mg via ORAL
  Filled 2013-11-03 (×3): qty 3

## 2013-11-03 MED ORDER — ALBUTEROL SULFATE (2.5 MG/3ML) 0.083% IN NEBU: 2.5000 mg | INHALATION_SOLUTION | Freq: Four times a day (QID) | RESPIRATORY_TRACT | Status: DC | PRN

## 2013-11-03 NOTE — Progress Notes (Signed)
PROGRESS NOTE  Elizabeth Orr QAS:[id number] DOB: Jul 01, 1961 DOA: 10/31/2013 PCP: Yevette Edwards, NP Primary Gastroenterologist: Dr. Gala Romney   Summary: 53 year old woman presented with complaints of abdominal pain, nausea, vomiting. Noted be guaiac positive. Initial evaluation notable for lactic acidosis patient was referred for further evaluation for GI for possible GI bleed in the setting of daily Lovenox status post orthopedic procedure. Status post EGD 3/16 with question of eosinophilic esophagitis. Outpatient evaluation with repeat EGD recommended.  Assessment/Plan: 1. Possible GI bleed. EGD results below, consider eosinophilic esophagitis. GI is recommended PPI, short-term Carafate, outpatient followup. Hemoglobin has remained stable. No evidence of significant bleeding. 2. Abdominal pain resolved. Significance unclear. CT of the abdomen and pelvis was unremarkable. Appears to be tolerating diet. 3. Elevated lactate. Now nearly normal. Expect spontaneous resolution. Etiology unclear. No evidence of infection or acute abnormality. No signs of ischemia on CT. GI discussed with Dr. Burt Knack in radiology, who stated SMA, IMA, and celiac are patent with low concern for ischemic process.  4. Questionable refracture around the implant of the right femur. The patient will be managed in a knee immobilizer and will f/u with Dr. Rhona Raider as an outpatient. 5. Anxiety, depression. Most likely etiology for subacute encephalopathy. She had extensive evaluation last hospitalization and was evaluated by neurology at that time. EEG at that time was also unremarkable. She appears to be at baseline. No further evaluation suggested at this point. 6. Bimalleolar fracture left ankle status post ORIF 1/28. Now bedbound. 7. History of alcoholism.  8. Cirrhosis/steatosis. Thought to be alcohol induced. Aldactone. Virus hepatitis panel negative. 9. Chronic pain. 10. Morbid obesity   Continue PPI twice a day. Short-term  Carafate. Outpatient GI followup.   Anticipate discharge 3/19.  Code Status: full code DVT prophylaxis: SCDs Family Communication: none present Disposition Plan:   Murray Hodgkins, MD  [facility]  Pager [phone] If 7PM-7AM, please contact night-coverage at [email or web address], password [facility] 11/03/2013, 12:43 PM  LOS: 3 days   Consultants:  Gastroenterology  Orthopedics  Procedures:  EGD: IMPRESSION: Narrowed, somewhat "ringed" appearing esophagus with marked fragility as described above. Endoscopic findings suspicious for eosinophilic esophagitis (biopsies previously negative). No biopsies today because of pre-existing GI bleed and marked fragility of the esophagus. Hiatal hernia  Antibiotics:    HPI/Subjective: She feels better. She denies complaints. No abdominal pain. Tolerating diet.  She has been noted to be confused.  Objective: Filed Vitals:   11/02/13 1130 11/02/13 1411 11/02/13 2100 11/03/13 0400  BP: 144/82 114/72 103/50 130/88  Pulse: 112 90 83 88  Temp:  98.1 F (36.7 C) 98.2 F (36.8 C) 98 F (36.7 C)  TempSrc:  Oral Oral Oral  Resp:  18 20 14   Height:      Weight:      SpO2: 97% 98% 100% 100%    Intake/Output Summary (Last 24 hours) at 11/03/13 1243 Last data filed at 11/03/13 0400  Gross per 24 hour  Intake    236 ml  Output    100 ml  Net    136 ml     Filed Weights   10/31/13 1213 10/31/13 1640 10/31/13 1822  Weight: 81.647 kg (180 lb) 81.1 kg (178 lb 12.7 oz) 81.1 kg (178 lb 12.7 oz)    Exam:   Afebrile, vital signs stable. No hypoxia.  Gen. Appears calm and comfortable. Speech fluent and clear.  Cardiovascular regular rate and rhythm. No murmur, rub or gallop. No lower extremity edema.  Respiratory clear to auscultation bilaterally. No  wheezes, rales rhonchi. Normal respiratory effort.  Abdomen soft nontender nondistended.  Psychiatric appears depressed. Oriented to self, hospital. Not month or year. Labile  affect.  Data Reviewed:  Complete metabolic panel noted. Total bilirubin has decreased from 1.8. AST has decreased from 39.   Lactic acid decreasing, 2.9.  Hemoglobin is stable 10.2.  Scheduled Meds: . metoprolol tartrate  25 mg Oral BID  . pantoprazole  40 mg Oral BID AC  . sodium chloride  3 mL Intravenous Q12H  . sucralfate  1 g Oral TID WC & HS   Continuous Infusions: . 0.9 % NaCl with KCl 40 mEq / L 50 mL/hr at 11/02/13 1824    Active Problems:   Hepatic steatosis   Hypoalbuminemia   Alcoholic cirrhosis of liver   Abdominal pain   GI bleed   Hypokalemia   Fracture of knee region   Time spent 25  minutes

## 2013-11-03 NOTE — Progress Notes (Signed)
REVIEWED.  

## 2013-11-03 NOTE — Progress Notes (Signed)
    Subjective: Confused, tearful. Pain "all over".   Objective: Vital signs in last 24 hours: Temp:  [98 F (36.7 C)-98.2 F (36.8 C)] 98 F (36.7 C) (03/18 0400) Pulse Rate:  [83-112] 88 (03/18 0400) Resp:  [14-20] 14 (03/18 0400) BP: (103-144)/(50-88) 130/88 mmHg (03/18 0400) SpO2:  [97 %-100 %] 100 % (03/18 0400) Last BM Date: 11/02/13 General:   Alert and oriented to person and place, confused regarding year. Tearful.  Eyes:  Mild scleral icterus Heart:  S1, S2 present, no murmurs noted.  Lungs: Clear to auscultation bilaterally Abdomen:  Bowel sounds present, soft, non-tender, non-distended. Obese. Hepatomegaly Extremities:  Right leg with soft cast. LLE with mild edema Neurologic:  Alert and  oriented to person and place only.    Intake/Output from previous day: 03/17 0701 - 03/18 0700 In: 336 [P.O.:336] Out: 100 [Urine:100] Intake/Output this shift:    Lab Results:  Recent Labs  11/01/13 0659 11/02/13 0515 11/03/13 0527  WBC 7.1 6.8 5.2  HGB 11.5* 10.4* 10.2*  HCT 35.0* 31.7* 31.3*  PLT 168 164 161   BMET  Recent Labs  11/01/13 0659 11/02/13 0515 11/03/13 0527  NA 134* 135* 136*  K 3.8 4.0 4.2  CL 98 102 105  CO2 25 22 20   GLUCOSE 89 90 90  BUN 5* 5* 5*  CREATININE 0.48* 0.46* 0.50  CALCIUM 8.2* 8.2* 8.0*   LFT  Recent Labs  10/31/13 1241 11/01/13 0659 11/03/13 0527  PROT 6.5 6.0 5.1*  ALBUMIN 1.7* 1.6* 1.4*  AST 45* 41* 39*  ALT 16 14 13   ALKPHOS 269* 248* 211*  BILITOT 2.6* 2.3* 1.8*   PT/INR  Recent Labs  10/31/13 1241 11/01/13 0659  LABPROT 16.2* 17.1*  INR 1.33 1.43    Lab Results  Component Value Date   FERRITIN 223 10/28/2013     Assessment: 53 year old female admitted with dyspepsia, heme positive stool, possible melena in setting of daily Lovenox after orthopedic procedure, s/p EGD 3/16 with question of eosinophilic esophagitis but no biopsies due to friable esophagus. Outpatient evaluation with repeat EGD  recommended in a more elective setting. Clinically improved, no overt signs of GI bleeding. Hgb stable.  Possible cirrhosis: etiology likely secondary to ETOH/NASH. Upon review of chart, ceruloplasmin marginally low at 18 (normal 20-60) in Feb 2015. Could consider repeating as outpatient; if concern remains, recommend 24 hour urine copper. LFTs overall stable.   Confusion: in setting of historical ETOH abuse, narcotics, chronic. Would consider neurological consult.   Anemia: multifactorial with normal ferritin.  Plan: PPI BID Carafate QID for total of 5-7 days Outpatient elective endoscopy Cirrhosis outpatient follow-up Will follow peripherally  Orvil Feil, ANP-BC [facility] Gastroenterology    LOS: 3 days    11/03/2013, 8:02 AM

## 2013-11-03 NOTE — Clinical Social Work Psychosocial (Signed)
Clinical Social Work Department BRIEF PSYCHOSOCIAL ASSESSMENT 11/03/2013  Patient:  Elizabeth Orr, Elizabeth Orr     Account Number:  [id number]     Admit date:  10/31/2013  Clinical Social Worker:  Wyatt Haste  Date/Time:  11/03/2013 04:01 PM  Referred by:  RN  Date Referred:  11/03/2013 Referred for  SNF Placement   Other Referral:   Interview type:  Patient Other interview type:   husband    PSYCHOSOCIAL DATA Living Status:  HUSBAND Admitted from facility:   Level of care:   Primary support name:  Elizabeth Orr Primary support relationship to patient:  SPOUSE Degree of support available:   adequate    CURRENT CONCERNS Current Concerns  Post-Acute Placement   Other Concerns:    SOCIAL WORK ASSESSMENT / PLAN CSW met with pt and pt's husband at bedside. Pt very well known to CSW from previous admissions last several months. She d/c to SNF in [place] and then returned home. Since then, pt has been at home with husband who is 24/7 caregiver. This has been difficult and he considered SNF again in the past, but decided to take her home again. Pt argumentative with husband during CSW assessment and denies most things he says. When CSW asked pt date, she replied February 2016. [place] Lacks capacity per MD. Husband indicates pt has been primarily bedbound recently and uses bed pan. She gets out of bed using lift only. Pt's care is very tiring for husband and he reports recent health concerns of his own. Husband indicates he has become overwhelmed with her needs and needs to seek placement again. CSW went through placement process and his one wish is to have her as close to home as possible. He is aware that Medicaid only limits options. Agreeable to [place], [place], or [place] counties. CSW informed him that facility will determine if any upfront amount is due.   Assessment/plan status:  Psychosocial Support/Ongoing Assessment of  Needs Other assessment/ plan:   Information/referral to community resources:   SNF list    PATIENT'S/FAMILY'S RESPONSE TO PLAN OF CARE: Pt very confused. Husband requesting placement for extensive care needs. CSW will fax out referral and follow up with offers.       Benay Pike, [place]

## 2013-11-03 NOTE — Clinical Social Work Placement (Signed)
Clinical Social Work Department CLINICAL SOCIAL WORK PLACEMENT NOTE 11/03/2013  Patient:  Elizabeth Orr, Elizabeth Orr  Account Number:  [id number] Admit date:  10/31/2013  Clinical Social Worker:  Benay Pike, LCSW  Date/time:  11/03/2013 03:58 PM  Clinical Social Work is seeking post-discharge placement for this patient at the following level of care:   [place]   (*CSW will update this form in Epic as items are completed)   11/03/2013  Patient/family provided with [place] Department of Clinical Social Work's list of facilities offering this level of care within the geographic area requested by the patient (or if unable, by the patient's family).  11/03/2013  Patient/family informed of their freedom to choose among providers that offer the needed level of care, that participate in Medicare, Medicaid or managed care program needed by the patient, have an available bed and are willing to accept the patient.  11/03/2013  Patient/family informed of [facility]' ownership interest in [facility], as well as of the fact that they are under no obligation to receive care at this facility.  PASARR submitted to EDS on  PASARR number received from [place] on   FL2 transmitted to all facilities in geographic area requested by pt/family on  11/03/2013 FL2 transmitted to all facilities within larger geographic area on 11/03/2013  Patient informed that his/her managed care company has contracts with or will negotiate with  certain facilities, including the following:     Patient/family informed of bed offers received:   Patient chooses bed at  Physician recommends and patient chooses bed at    Patient to be transferred to  on   Patient to be transferred to facility by   The following physician request were entered in Epic:   Additional Comments: Pt has existing pasarr number.  Benay Pike, [place]

## 2013-11-04 MED ORDER — SUCRALFATE 1 GM/10ML PO SUSP: 1.0000 g | Freq: Three times a day (TID) | ORAL | Status: AC

## 2013-11-04 MED ORDER — PANTOPRAZOLE SODIUM 40 MG PO TBEC: 40.0000 mg | DELAYED_RELEASE_TABLET | Freq: Two times a day (BID) | ORAL | Status: DC

## 2013-11-04 MED ORDER — OMEPRAZOLE 20 MG PO CPDR: 20.0000 mg | DELAYED_RELEASE_CAPSULE | Freq: Two times a day (BID) | ORAL | Status: AC

## 2013-11-04 NOTE — Progress Notes (Signed)
PROGRESS NOTE  Elizabeth Orr GMW:[id number] DOB: July 05, 1961 DOA: 10/31/2013 PCP: Yevette Edwards, NP Primary Gastroenterologist: Dr. Gala Romney   Summary: 53 year old woman presented with complaints of abdominal pain, nausea, vomiting. Noted be guaiac positive. Initial evaluation notable for lactic acidosis patient was referred for further evaluation for GI for possible GI bleed in the setting of daily Lovenox status post orthopedic procedure. Status post EGD 3/16 with question of eosinophilic esophagitis. Outpatient evaluation with repeat EGD recommended.  Assessment/Plan: 1. Possible GI bleed. EGD results below, consider eosinophilic esophagitis. GI is recommended PPI, short-term Carafate, outpatient followup. Hemoglobin has remained stable. No evidence of significant bleeding. 2. Abdominal pain resolved. Significance unclear. CT of the abdomen and pelvis was unremarkable. Appears to be tolerating diet. 3. Elevated lactate. Expect spontaneous resolution. Etiology unclear. No evidence of infection or acute abnormality. No signs of ischemia on CT. GI discussed with Dr. Burt Knack in radiology, who stated SMA, IMA, and celiac are patent with low concern for ischemic process.  4. Questionable refracture around the implant of the right femur. The patient will be managed in a knee immobilizer and will f/u with Dr. Rhona Raider as an outpatient. 5. Anxiety, depression. 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 She had extensive evaluation last hospitalization and was evaluated by neurology at that time. EEG at that time was also unremarkable. She appears to be at baseline. No further evaluation suggested at this point. 6. Bimalleolar fracture left ankle status post ORIF 1/28. Discussed with orthopedics, activity ad lib. left leg. Minimal weight-bearing right leg. 7. History of alcoholism.  8. Cirrhosis/steatosis. Thought to be alcohol induced. Aldactone. Virus hepatitis panel negative. 9. Chronic pain. Stable.  Continue outpatient Percocet. 10. Morbid obesity   Continue PPI twice a day. Short-term Carafate. Outpatient GI followup.   Minimal weight-bearing right leg. Maintain knee immobilizer all times.  Activity ad lib left leg/ankle.  Consider resuming aspirin one week.  F/u with Dr. Rhona Raider has been arranged for Wednesday 25th at [place] with home health RN, nurses aide. Skilled nursing facility was desired by family however could not be obtained, see social work notes for full details.  Code Status: full code DVT prophylaxis: SCDs Family Communication: none present Disposition Plan:   Murray Hodgkins, MD  [facility]  Pager [phone] If 7PM-7AM, please contact night-coverage at [email or web address], password [facility] 11/04/2013, 3:46 PM  LOS: 4 days   Consultants:  Gastroenterology  Orthopedics  Procedures:  EGD: IMPRESSION: Narrowed, somewhat "ringed" appearing esophagus with marked fragility as described above. Endoscopic findings suspicious for eosinophilic esophagitis (biopsies previously negative). No biopsies today because of pre-existing GI bleed and marked fragility of the esophagus. Hiatal hernia  Antibiotics:    HPI/Subjective: Feeling better today. Wants to go home. No complaints now.   Objective: Filed Vitals:   11/03/13 1437 11/03/13 1515 11/03/13 2235 11/04/13 0557  BP: 100/63 116/61 122/60 109/65  Pulse: 73 91 85 82  Temp: 98.1 F (36.7 C) 97.7 F (36.5 C) 98.8 F (37.1 C) 98.6 F (37 C)  TempSrc:   Oral Oral  Resp: 16 20 20 20   Height:      Weight:      SpO2: 93% 99% 100% 100%    Intake/Output Summary (Last 24 hours) at 11/04/13 1546 Last data filed at 11/04/13 1244  Gross per 24 hour  Intake    240 ml  Output    250 ml  Net    -10 ml     Filed Weights   10/31/13 1213 10/31/13  1640 10/31/13 1822  Weight: 81.647 kg (180 lb) 81.1 kg (178 lb 12.7 oz) 81.1 kg (178 lb 12.7 oz)    Exam:   Afebrile, vital signs stable. No  hypoxia.  Gen. Appears calm and comfortable. Speech fluent and clear.  Cardiovascular regular rate and rhythm. No murmur, rub or gallop. No lower extremity edema.  Respiratory clear to auscultation bilaterally. No wheezes, rales or rhonchi. Normal respiratory effort.  Abdomen soft nontender and nondistended. No pain.  Bilateral lower extremities grossly normal perfusion. 2+ dorsalis pedis pulse. No significant edema. Legs are warm and dry.  Scheduled Meds: . docusate sodium  100 mg Oral BID  . furosemide  40 mg Oral Daily  . gabapentin  900 mg Oral BID  . metoprolol tartrate  25 mg Oral BID  . pantoprazole  40 mg Oral BID AC  . sodium chloride  3 mL Intravenous Q12H  . spironolactone  25 mg Oral Daily  . sucralfate  1 g Oral TID WC & HS   Continuous Infusions:    Active Problems:   Hepatic steatosis   Hypoalbuminemia   Alcoholic cirrhosis of liver   Abdominal pain   GI bleed   Hypokalemia   Fracture of knee region

## 2013-11-04 NOTE — Discharge Summary (Signed)
Physician Discharge Summary  Elizabeth Orr [id number] DOB: 08-30-1960 DOA: 10/31/2013  PCP: Yevette Edwards, NP  Admit date: 10/31/2013 Discharge date: 11/04/2013  Recommendations for Outpatient Follow-up:  1. Followup possible eosinophilic esophagitis. Outpatient evaluation with gastroenterology, consider repeat EGD. Consider outpatient cirrhosis evaluation.  2. Followup anxiety, depression 3. Followup right femur fracture as below 4. Home health RN, nurses aid, PT  Follow-up Information   Follow up with Yevette Edwards, NP. Schedule an appointment as soon as possible for a visit in 1 week.   Specialty:  Family Medicine   Contact information:   [place] [place] [address] [phone]       Follow up with Hessie Dibble, MD On 11/10/2013. (9:45 AM)    Specialty:  Orthopedic Surgery   Contact information:   [address] [place] [address] [phone]       Follow up with Barney Drain, MD In 3 weeks.   Specialty:  Gastroenterology   Contact information:   [address] [place] [address] [phone]      Discharge Diagnoses:  1. Suspected upper GI bleed 2. Possible eosinophilic esophagitis 3. Abdominal pain, unclear etiology 4. Elevated lactate 5. Suspected refracture distal right femur metaphysis   Discharge Condition: Improved Disposition: Home with home health  Diet recommendation: Heart healthy  Filed Weights   10/31/13 1213 10/31/13 1640 10/31/13 1822  Weight: 81.647 kg (180 lb) 81.1 kg (178 lb 12.7 oz) 81.1 kg (178 lb 12.7 oz)    History of present illness:  53 year old woman presented with complaints of abdominal pain, nausea, vomiting. Noted be guaiac positive. Initial evaluation notable for lactic acidosis patient was referred for further evaluation for GI for possible GI bleed in the setting of daily Lovenox status post orthopedic procedure.   Hospital Course:  No bleeding observed during  hospitalization. Seen by GI. Status post EGD 3/16 with question of eosinophilic esophagitis. Outpatient evaluation with repeat EGD recommended. PPI twice daily. Lovenox discontinued, aspirin on hold. She was on daily Lovenox orthopedics procedure at the end of January. No indications to continue therapy at this point especially in the setting of suspected GI bleed. Abdominal pain resolved, imaging was unremarkable. Elevated lactate spontaneously improved. Patient was evaluated for skilled nursing facility, however this could not be obtained, see social work notes for further details. Plan is discharge home with home health individual issues as below.  1. Suspected GI bleed. EGD results below, consider eosinophilic esophagitis. GI is recommended PPI, short-term Carafate, outpatient followup. Hemoglobin has remained stable. No evidence of significant bleeding. 2. Abdominal pain resolved. Significance unclear. CT of the abdomen and pelvis was unremarkable. Tolerating diet. 3. Elevated lactate. Expect spontaneous resolution. Etiology unclear. No evidence of infection or acute abnormality. No signs of ischemia on CT. GI discussed with Dr. Burt Knack in radiology, who stated SMA, IMA, and celiac are patent with low concern for ischemic process.  4. Refracture right femur. Orthopedics Dr. Luna Glasgow spoke with Dr. Rhona Raider who recommended outpatient follow-up in his office. The patient will be managed in a knee immobilizer and will f/u with Dr. Rhona Raider as an outpatient. 5. Anxiety, depression. 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 She had extensive evaluation last hospitalization and was evaluated by neurology at that time. EEG at that time was also unremarkable. She appears to be at baseline. No further evaluation suggested at this point. 6. Bimalleolar fracture left ankle status post ORIF 1/28. Discussed with orthopedics, activity ad lib. left leg. Minimal  weight-bearing right leg. 7. History of alcoholism.    8. Cirrhosis/steatosis. Thought to be alcohol induced. Aldactone. Virus hepatitis panel negative. 9. Chronic pain. Stable. Continue outpatient Percocet. 10. Morbid obesity Continue PPI twice a day. Short-term Carafate. Outpatient GI followup.  Minimal weight-bearing right leg. Maintain knee immobilizer all times.  Activity ad lib left leg/ankle.  Consider resuming aspirin one week.  F/u with Dr. Rhona Raider has been arranged for Wednesday 25th at [facility] with home health RN, PT, nurses aide. Skilled nursing facility was desired by family however could not be obtained, see social work notes for full details.  Consultants:  Gastroenterology  Orthopedics Procedures:  EGD: IMPRESSION: Narrowed, somewhat "ringed" appearing esophagus with marked fragility as described above. Endoscopic findings suspicious for eosinophilic esophagitis (biopsies previously negative). No biopsies today because of pre-existing GI bleed and marked fragility of the esophagus. Hiatal hernia  Discharge Instructions  Discharge Orders   Future Appointments Provider Department Dept Phone   11/11/2013 10:00 AM Orvil Feil, NP [facility] [phone]   12/20/2013 10:00 AM [facility] [place]   Please arrive 15 minutes prior to your appointment time. Any medications can be taken as usual.   01/27/2014 9:00 AM [place] [phone]   01/27/2014 9:00 AM [facility] Provider [place] [phone]   Future Orders Complete By Expires   Diet - low sodium heart healthy  As directed    Discharge instructions  As directed    Comments:     Minimal weight-bearing right leg. Maintain knee immobilizer at all times. Activity as able left leg/ankle. Hold aspirin until followup with your primary care provider. Stop Lovenox secondary to recent bleeding. Call your physician or seek immediate medical attention for  increased pain or worsening of condition.       Medication List    STOP taking these medications       aspirin 81 MG EC tablet     ciprofloxacin 500 MG tablet  Commonly known as:  CIPRO     enoxaparin 40 MG/0.4ML injection  Commonly known as:  LOVENOX     potassium chloride SA 20 MEQ tablet  Commonly known as:  K-DUR,KLOR-CON      TAKE these medications       albuterol 108 (90 BASE) MCG/ACT inhaler  Commonly known as:  PROVENTIL HFA;VENTOLIN HFA  Inhale 2 puffs into the lungs every 6 (six) hours as needed for wheezing.     docusate sodium 100 MG capsule  Commonly known as:  COLACE  Take 100 mg by mouth 2 (two) times daily.     furosemide 40 MG tablet  Commonly known as:  LASIX  Take 1 tablet (40 mg total) by mouth daily. Take for swelling.     gabapentin 300 MG capsule  Commonly known as:  NEURONTIN  Take 3 capsules (900 mg total) by mouth 2 (two) times daily.     iron polysaccharides 150 MG capsule  Commonly known as:  NIFEREX  Take 1 capsule (150 mg total) by mouth daily.     magnesium hydroxide 400 MG/5ML suspension  Commonly known as:  MILK OF MAGNESIA  Take 30 mLs by mouth daily as needed for constipation.     multivitamins with iron Tabs tablet  Take 1 tablet by mouth daily.     omeprazole 20 MG capsule  Commonly known as:  PRILOSEC  Take 1 capsule (20 mg total) by mouth 2 (  two) times daily before a meal.     oxyCODONE-acetaminophen 5-325 MG per tablet  Commonly known as:  PERCOCET/ROXICET  Take 1 tablet by mouth every 12 (twelve) hours as needed for moderate pain or severe pain.     polyethylene glycol packet  Commonly known as:  MIRALAX / GLYCOLAX  Take 17 g by mouth daily.     spironolactone 25 MG tablet  Commonly known as:  ALDACTONE  Take 1 tablet (25 mg total) by mouth daily. For generalized swelling.     sucralfate 1 GM/10ML suspension  Commonly known as:  CARAFATE  Take 10 mLs (1 g total) by mouth 4 (four) times daily -  with meals and  at bedtime.     thiamine 100 MG tablet  Take 1 tablet (100 mg total) by mouth daily.     Vitamin D (Ergocalciferol) 50000 UNITS Caps capsule  Commonly known as:  DRISDOL  Take 50,000 Units by mouth every 7 (seven) days.       Allergies  Allergen Reactions  . Citalopram     Makes her feel drowsy/sleepy.  . Effexor [Venlafaxine Hcl]     Makes pt feel very bad,will not take again.  . Motrin [Ibuprofen] Swelling    Lips swell  . Trazodone And Nefazodone     Makes pt feel very bad and "hung over"    The results of significant diagnostics from this hospitalization (including imaging, microbiology, ancillary and laboratory) are listed below for reference.    Significant Diagnostic Studies: Dg Tibia/fibula Left  10/31/2013   CLINICAL DATA:  Leg pain  EXAM: LEFT TIBIA AND FIBULA - 2 VIEW  COMPARISON:  None.  FINDINGS: Postsurgical changes are noted. No acute bony abnormality is seen. No soft tissue abnormality is noted.  IMPRESSION: No acute abnormality seen.   Electronically Signed   By: Inez Catalina M.D.   On: 10/31/2013 13:46   Mr Jeri Cos EU Contrast  Ct Abdomen Pelvis W Contrast  10/31/2013   CLINICAL DATA:  Diffuse abdominal pain. Cirrhosis. History of breast cancer.  EXAM: CT ABDOMEN AND PELVIS WITH CONTRAST  TECHNIQUE: Multidetector CT imaging of the abdomen and pelvis was performed using the standard protocol following bolus administration of intravenous contrast.  CONTRAST:  124mL OMNIPAQUE IOHEXOL 300 MG/ML SOLN, 68mL OMNIPAQUE IOHEXOL 300 MG/ML SOLN  COMPARISON:  CT scan dated 10/08/2013  FINDINGS: There is hepatomegaly with diffuse hepatic steatosis. Biliary tree, spleen, pancreas, adrenal glands, and kidneys are normal except for tiny cysts in both kidneys.  Bowel appears normal. There is a small amount of ascites in the pelvis which is new. There is a stable 2.7 cm cyst on the left ovary. No osseous abnormality. Lung bases are clear.  IMPRESSION: 1. New small amount of ascites in  the pelvis since the prior study. 2. Chronic hepatomegaly with hepatic steatosis.   Electronically Signed   By: Rozetta Nunnery M.D.   On: 10/31/2013 16:51   Dg Chest Port 1 View  10/31/2013   CLINICAL DATA:  Abdominal pain  EXAM: PORTABLE CHEST - 1 VIEW  COMPARISON:  2/19/ 15  FINDINGS: Cardiac shadow is stable. The lungs are clear bilaterally. A left chest wall port is again seen and stable. Postsurgical changes are noted on the right.  IMPRESSION: No acute abnormality noted.   Electronically Signed   By: Inez Catalina M.D.   On: 10/31/2013 13:42   Dg Knee Complete 4 Views Right  10/31/2013   CLINICAL DATA:  Leg pain  EXAM: RIGHT KNEE - COMPLETE 4+ VIEW  COMPARISON:  None.  FINDINGS: There are changes consistent with prior medullary rod placement. There is an oblique fracture identified through the distal metaphysis extending inferiorly around the orthopedic hardware. It has a different appearance than that seen on the prior exam and may represent an acute fracture. Some callus formation is noted  IMPRESSION: Changes consistent with prior surgical fixation. There are changes suggestive of an acute fracture in the distal femoral metaphysis. Correlation with any recent trauma is recommended.   Electronically Signed   By: Inez Catalina M.D.   On: 10/31/2013 13:45   Dg Foot Complete Left  10/31/2013   CLINICAL DATA:  Left foot pain  EXAM: LEFT FOOT - COMPLETE 3+ VIEW  COMPARISON:  None.  FINDINGS: Postsurgical changes are noted in the distal fibula. No acute bony abnormality is noted.  IMPRESSION: No acute abnormality seen.   Electronically Signed   By: Inez Catalina M.D.   On: 10/31/2013 13:42   Microbiology: Recent Results (from the past 240 hour(s))  URINE CULTURE     Status: None   Collection Time    10/31/13  1:40 PM      Result Value Ref Range Status   Specimen Description URINE, CLEAN CATCH   Final   Special Requests NONE   Final   Culture  Setup Time     Final   Value: 10/31/2013 21:17      Performed at [facility] Count     Final   Value: 70,000 COLONIES/ML     Performed at [facility]   Culture     Final   Value: YEAST     Performed at [facility]   Report Status 11/02/2013 FINAL   Final     Labs: Basic Metabolic Panel:  Recent Labs Lab 10/31/13 1241 10/31/13 1412 11/01/13 0659 11/02/13 0515 11/03/13 0527  NA 133*  --  134* 135* 136*  K 2.9*  --  3.8 4.0 4.2  CL 89*  --  98 102 105  CO2 28  --  25 22 20   GLUCOSE 108*  --  89 90 90  BUN 6  --  5* 5* 5*  CREATININE 0.51  --  0.48* 0.46* 0.50  CALCIUM 8.7  --  8.2* 8.2* 8.0*  MG  --  1.9  --   --   --    Liver Function Tests:  Recent Labs Lab 10/31/13 1241 11/01/13 0659 11/03/13 0527  AST 45* 41* 39*  ALT 16 14 13   ALKPHOS 269* 248* 211*  BILITOT 2.6* 2.3* 1.8*  PROT 6.5 6.0 5.1*  ALBUMIN 1.7* 1.6* 1.4*    Recent Labs Lab 10/31/13 1244 11/01/13 1127  AMMONIA 37 43   CBC:  Recent Labs Lab 10/31/13 1241 11/01/13 0659 11/02/13 0515 11/03/13 0527  WBC 8.6 7.1 6.8 5.2  NEUTROABS 6.2  --   --   --   HGB 12.3 11.5* 10.4* 10.2*  HCT 36.9 35.0* 31.7* 31.3*  MCV 108.2* 108.4* 109.7* 111.4*  PLT 172 168 164 161     Recent Labs  09/25/13 1656  PROBNP 1957.0*   CBG:  Recent Labs Lab 10/31/13 1227  GLUCAP 94    Active Problems:   Hepatic steatosis   Hypoalbuminemia   Alcoholic cirrhosis of liver   Abdominal pain   GI bleed   Hypokalemia   Fracture of knee region   Time coordinating discharge: 45 minutes  Signed:  Murray Hodgkins, MD [facility] 11/04/2013, 4:34 PM

## 2013-11-04 NOTE — Progress Notes (Signed)
Patient discharged home with husband.  IV removed-WNL. Reviewed DC instructions with patients husband.  Follow up in place with ortho and PCP.  Instructed husband to call and make appointment with GI in 3 weeks.  Instructed on changes to medications and how to take new ones.  Husband verbalizes understanding.  HH to follow.  Patient stable to DC. Left floor via WC and helped into truck using lift and assistance of 2 RNs and NT.

## 2013-11-04 NOTE — Clinical Social Work Note (Addendum)
CSW spoke with numerous facilities today and only bed offer currently is at [facility] as private pay. Husband even agreed to expand search to [place] which CSW did. One facility willing to consider with LOG. Verified with supervisor that [facility] is not appropriate as pt had one recently and left facility on her own to return home and husband also states he could pay but doesn't want to give $6000 up front. He asked if [facility] would take 2000 and [facility] refuses. Husband notified and is aware to contact them from home if he changes his mind. He requests private duty care list and CM provided this. Pt to d/c home with home health. CSW signing off. Husband indicates he is able to transport pt.   Elizabeth Orr, [place]

## 2013-11-11 ENCOUNTER — Ambulatory Visit: Payer: Medicaid Other | Admitting: Gastroenterology

## 2013-11-23 ENCOUNTER — Emergency Department (HOSPITAL_COMMUNITY): Payer: Medicaid Other

## 2013-11-23 ENCOUNTER — Encounter (HOSPITAL_COMMUNITY): Payer: Self-pay | Admitting: Emergency Medicine

## 2013-11-23 ENCOUNTER — Inpatient Hospital Stay (HOSPITAL_COMMUNITY)
Admission: EM | Admit: 2013-11-23 | Discharge: 2013-12-17 | DRG: 870 | Disposition: E | Payer: Medicaid Other | Attending: Pulmonary Disease | Admitting: Pulmonary Disease

## 2013-11-23 ENCOUNTER — Inpatient Hospital Stay (HOSPITAL_COMMUNITY): Payer: Medicaid Other

## 2013-11-23 DIAGNOSIS — R4182 Altered mental status, unspecified: Secondary | ICD-10-CM

## 2013-11-23 DIAGNOSIS — J690 Pneumonitis due to inhalation of food and vomit: Secondary | ICD-10-CM

## 2013-11-23 DIAGNOSIS — Z8249 Family history of ischemic heart disease and other diseases of the circulatory system: Secondary | ICD-10-CM

## 2013-11-23 DIAGNOSIS — E871 Hypo-osmolality and hyponatremia: Secondary | ICD-10-CM | POA: Diagnosis not present

## 2013-11-23 DIAGNOSIS — J9601 Acute respiratory failure with hypoxia: Secondary | ICD-10-CM

## 2013-11-23 DIAGNOSIS — D689 Coagulation defect, unspecified: Secondary | ICD-10-CM | POA: Diagnosis present

## 2013-11-23 DIAGNOSIS — Z515 Encounter for palliative care: Secondary | ICD-10-CM

## 2013-11-23 DIAGNOSIS — K7290 Hepatic failure, unspecified without coma: Secondary | ICD-10-CM

## 2013-11-23 DIAGNOSIS — E872 Acidosis, unspecified: Secondary | ICD-10-CM | POA: Diagnosis present

## 2013-11-23 DIAGNOSIS — Z833 Family history of diabetes mellitus: Secondary | ICD-10-CM | POA: Diagnosis not present

## 2013-11-23 DIAGNOSIS — K7682 Hepatic encephalopathy: Secondary | ICD-10-CM | POA: Diagnosis present

## 2013-11-23 DIAGNOSIS — Z888 Allergy status to other drugs, medicaments and biological substances status: Secondary | ICD-10-CM

## 2013-11-23 DIAGNOSIS — D509 Iron deficiency anemia, unspecified: Secondary | ICD-10-CM | POA: Diagnosis present

## 2013-11-23 DIAGNOSIS — F329 Major depressive disorder, single episode, unspecified: Secondary | ICD-10-CM | POA: Diagnosis present

## 2013-11-23 DIAGNOSIS — K219 Gastro-esophageal reflux disease without esophagitis: Secondary | ICD-10-CM | POA: Diagnosis present

## 2013-11-23 DIAGNOSIS — N170 Acute kidney failure with tubular necrosis: Secondary | ICD-10-CM | POA: Diagnosis present

## 2013-11-23 DIAGNOSIS — F1020 Alcohol dependence, uncomplicated: Secondary | ICD-10-CM | POA: Diagnosis present

## 2013-11-23 DIAGNOSIS — K7030 Alcoholic cirrhosis of liver without ascites: Secondary | ICD-10-CM | POA: Diagnosis present

## 2013-11-23 DIAGNOSIS — J9600 Acute respiratory failure, unspecified whether with hypoxia or hypercapnia: Secondary | ICD-10-CM

## 2013-11-23 DIAGNOSIS — R109 Unspecified abdominal pain: Secondary | ICD-10-CM

## 2013-11-23 DIAGNOSIS — Z9010 Acquired absence of unspecified breast and nipple: Secondary | ICD-10-CM

## 2013-11-23 DIAGNOSIS — G936 Cerebral edema: Secondary | ICD-10-CM | POA: Diagnosis not present

## 2013-11-23 DIAGNOSIS — J90 Pleural effusion, not elsewhere classified: Secondary | ICD-10-CM | POA: Diagnosis present

## 2013-11-23 DIAGNOSIS — E876 Hypokalemia: Secondary | ICD-10-CM | POA: Diagnosis present

## 2013-11-23 DIAGNOSIS — N390 Urinary tract infection, site not specified: Secondary | ICD-10-CM | POA: Diagnosis present

## 2013-11-23 DIAGNOSIS — F17200 Nicotine dependence, unspecified, uncomplicated: Secondary | ICD-10-CM | POA: Diagnosis present

## 2013-11-23 DIAGNOSIS — R6521 Severe sepsis with septic shock: Secondary | ICD-10-CM

## 2013-11-23 DIAGNOSIS — K922 Gastrointestinal hemorrhage, unspecified: Secondary | ICD-10-CM

## 2013-11-23 DIAGNOSIS — E43 Unspecified severe protein-calorie malnutrition: Secondary | ICD-10-CM | POA: Insufficient documentation

## 2013-11-23 DIAGNOSIS — Z853 Personal history of malignant neoplasm of breast: Secondary | ICD-10-CM

## 2013-11-23 DIAGNOSIS — F3289 Other specified depressive episodes: Secondary | ICD-10-CM | POA: Diagnosis present

## 2013-11-23 DIAGNOSIS — Z6833 Body mass index (BMI) 33.0-33.9, adult: Secondary | ICD-10-CM

## 2013-11-23 DIAGNOSIS — F411 Generalized anxiety disorder: Secondary | ICD-10-CM | POA: Diagnosis present

## 2013-11-23 DIAGNOSIS — R6520 Severe sepsis without septic shock: Secondary | ICD-10-CM

## 2013-11-23 DIAGNOSIS — A419 Sepsis, unspecified organism: Secondary | ICD-10-CM | POA: Diagnosis present

## 2013-11-23 DIAGNOSIS — G9340 Encephalopathy, unspecified: Secondary | ICD-10-CM

## 2013-11-23 DIAGNOSIS — N179 Acute kidney failure, unspecified: Secondary | ICD-10-CM

## 2013-11-23 DIAGNOSIS — G9341 Metabolic encephalopathy: Secondary | ICD-10-CM | POA: Diagnosis present

## 2013-11-23 HISTORY — DX: Pneumonitis due to inhalation of food and vomit: J69.0

## 2013-11-23 LAB — CBC WITH DIFFERENTIAL/PLATELET
Basophils Absolute: 0 10*3/uL (ref 0.0–0.1)
Basophils Relative: 0 % (ref 0–1)
EOS ABS: 0 10*3/uL (ref 0.0–0.7)
Eosinophils Relative: 0 % (ref 0–5)
HCT: 36.3 % (ref 36.0–46.0)
HEMOGLOBIN: 12.2 g/dL (ref 12.0–15.0)
LYMPHS ABS: 2.2 10*3/uL (ref 0.7–4.0)
LYMPHS PCT: 14 % (ref 12–46)
MCH: 34.9 pg — AB (ref 26.0–34.0)
MCHC: 33.6 g/dL (ref 30.0–36.0)
MCV: 103.7 fL — ABNORMAL HIGH (ref 78.0–100.0)
MONOS PCT: 8 % (ref 3–12)
Monocytes Absolute: 1.3 10*3/uL — ABNORMAL HIGH (ref 0.1–1.0)
NEUTROS ABS: 12.2 10*3/uL — AB (ref 1.7–7.7)
Neutrophils Relative %: 78 % — ABNORMAL HIGH (ref 43–77)
PLATELETS: 233 10*3/uL (ref 150–400)
RBC: 3.5 MIL/uL — ABNORMAL LOW (ref 3.87–5.11)
RDW: 15.8 % — ABNORMAL HIGH (ref 11.5–15.5)
WBC: 15.6 10*3/uL — AB (ref 4.0–10.5)

## 2013-11-23 LAB — URINE MICROSCOPIC-ADD ON

## 2013-11-23 LAB — I-STAT CG4 LACTIC ACID, ED: LACTIC ACID, VENOUS: 4.59 mmol/L — AB (ref 0.5–2.2)

## 2013-11-23 LAB — URINALYSIS, ROUTINE W REFLEX MICROSCOPIC
Glucose, UA: NEGATIVE mg/dL
Ketones, ur: 15 mg/dL — AB
NITRITE: POSITIVE — AB
Protein, ur: 30 mg/dL — AB
SPECIFIC GRAVITY, URINE: 1.028 (ref 1.005–1.030)
UROBILINOGEN UA: 1 mg/dL (ref 0.0–1.0)
pH: 5 (ref 5.0–8.0)

## 2013-11-23 LAB — I-STAT ARTERIAL BLOOD GAS, ED
Acid-base deficit: 1 mmol/L (ref 0.0–2.0)
Bicarbonate: 24.3 mEq/L — ABNORMAL HIGH (ref 20.0–24.0)
O2 SAT: 99 %
Patient temperature: 97.2
TCO2: 26 mmol/L (ref 0–100)
pCO2 arterial: 41.6 mmHg (ref 35.0–45.0)
pH, Arterial: 7.371 (ref 7.350–7.450)
pO2, Arterial: 139 mmHg — ABNORMAL HIGH (ref 80.0–100.0)

## 2013-11-23 LAB — TYPE AND SCREEN
ABO/RH(D): O POS
Antibody Screen: NEGATIVE

## 2013-11-23 LAB — PROTIME-INR
INR: 2.27 — ABNORMAL HIGH (ref 0.00–1.49)
Prothrombin Time: 24.3 seconds — ABNORMAL HIGH (ref 11.6–15.2)

## 2013-11-23 LAB — RAPID URINE DRUG SCREEN, HOSP PERFORMED
AMPHETAMINES: NOT DETECTED
BARBITURATES: NOT DETECTED
Benzodiazepines: NOT DETECTED
Cocaine: NOT DETECTED
OPIATES: POSITIVE — AB
TETRAHYDROCANNABINOL: NOT DETECTED

## 2013-11-23 LAB — COMPREHENSIVE METABOLIC PANEL
ALT: 15 U/L (ref 0–35)
AST: 70 U/L — ABNORMAL HIGH (ref 0–37)
Albumin: 1.2 g/dL — ABNORMAL LOW (ref 3.5–5.2)
Alkaline Phosphatase: 221 U/L — ABNORMAL HIGH (ref 39–117)
BUN: 30 mg/dL — AB (ref 6–23)
CALCIUM: 8.4 mg/dL (ref 8.4–10.5)
CHLORIDE: 94 meq/L — AB (ref 96–112)
CO2: 22 meq/L (ref 19–32)
CREATININE: 3.76 mg/dL — AB (ref 0.50–1.10)
GFR calc non Af Amer: 13 mL/min — ABNORMAL LOW (ref >90)
GFR, EST AFRICAN AMERICAN: 15 mL/min — AB (ref >90)
GLUCOSE: 95 mg/dL (ref 70–99)
Potassium: 3.4 mEq/L — ABNORMAL LOW (ref 3.7–5.3)
Sodium: 137 mEq/L (ref 137–147)
Total Bilirubin: 6.6 mg/dL — ABNORMAL HIGH (ref 0.3–1.2)
Total Protein: 5.2 g/dL — ABNORMAL LOW (ref 6.0–8.3)

## 2013-11-23 LAB — ETHANOL: Alcohol, Ethyl (B): <11 mg/dL (ref 0–11)

## 2013-11-23 LAB — POC OCCULT BLOOD, ED: FECAL OCCULT BLD: POSITIVE — AB

## 2013-11-23 LAB — I-STAT TROPONIN, ED: TROPONIN I, POC: 0.03 ng/mL (ref 0.00–0.08)

## 2013-11-23 LAB — POC URINE PREG, ED: PREG TEST UR: NEGATIVE

## 2013-11-23 LAB — AMMONIA: Ammonia: 79 umol/L — ABNORMAL HIGH (ref 11–60)

## 2013-11-23 MED ORDER — SODIUM CHLORIDE 0.9 % IV SOLN
50.0000 ug/h | INTRAVENOUS | Status: DC
  Filled 2013-11-23 (×3): qty 1

## 2013-11-23 MED ORDER — LIDOCAINE HCL (CARDIAC) 20 MG/ML IV SOLN
INTRAVENOUS | Status: AC
  Filled 2013-11-23: qty 5

## 2013-11-23 MED ORDER — PANTOPRAZOLE SODIUM 40 MG IV SOLR
40.0000 mg | Freq: Once | INTRAVENOUS | Status: DC
  Administered 2013-11-23: 40 mg via INTRAVENOUS

## 2013-11-23 MED ORDER — NOREPINEPHRINE BITARTRATE 1 MG/ML IJ SOLN
INTRAMUSCULAR | Status: AC | PRN
  Administered 2013-11-23: 20 ug/kg/min via INTRAVENOUS

## 2013-11-23 MED ORDER — SUCCINYLCHOLINE CHLORIDE 20 MG/ML IJ SOLN
INTRAMUSCULAR | Status: AC
  Filled 2013-11-23: qty 1

## 2013-11-23 MED ORDER — M.V.I. ADULT IV INJ
Freq: Once | INTRAVENOUS | Status: AC
  Administered 2013-11-24: 01:00:00 via INTRAVENOUS
  Filled 2013-11-23: qty 10

## 2013-11-23 MED ORDER — VITAMIN K1 10 MG/ML IJ SOLN
10.0000 mg | Freq: Once | INTRAVENOUS | Status: AC
  Administered 2013-11-24: 10 mg via INTRAVENOUS
  Filled 2013-11-23: qty 1

## 2013-11-23 MED ORDER — NOREPINEPHRINE BITARTRATE 1 MG/ML IJ SOLN
2.0000 ug/min | INTRAVENOUS | Status: DC
  Administered 2013-11-24: 10 ug/min via INTRAVENOUS
  Administered 2013-11-24: 8 ug/min via INTRAVENOUS
  Administered 2013-11-24: 12 ug/min via INTRAVENOUS
  Administered 2013-11-25: 25 ug/min via INTRAVENOUS
  Administered 2013-11-25: 12 ug/min via INTRAVENOUS
  Administered 2013-11-25: 20 ug/min via INTRAVENOUS
  Administered 2013-11-25: 8 ug/min via INTRAVENOUS
  Administered 2013-11-26 (×2): 10 ug/min via INTRAVENOUS
  Administered 2013-11-26: 12 ug/min via INTRAVENOUS
  Administered 2013-11-27: 5 ug/min via INTRAVENOUS
  Administered 2013-11-27: 7 ug/min via INTRAVENOUS
  Administered 2013-11-27: 9 ug/min via INTRAVENOUS
  Filled 2013-11-23 (×15): qty 4

## 2013-11-23 MED ORDER — ETOMIDATE 2 MG/ML IV SOLN
INTRAVENOUS | Status: AC
  Filled 2013-11-23: qty 20

## 2013-11-23 MED ORDER — DOPAMINE-DEXTROSE 3.2-5 MG/ML-% IV SOLN
INTRAVENOUS | Status: AC | PRN
  Administered 2013-11-23: 5 ug/kg/min via INTRAVENOUS

## 2013-11-23 MED ORDER — SODIUM CHLORIDE 0.9 % IV SOLN
2.0000 mg/h | INTRAVENOUS | Status: DC
  Filled 2013-11-23: qty 20

## 2013-11-23 MED ORDER — THIAMINE HCL 100 MG/ML IJ SOLN
100.0000 mg | Freq: Every day | INTRAMUSCULAR | Status: DC
  Administered 2013-11-24 – 2013-11-27 (×4): 100 mg via INTRAVENOUS
  Filled 2013-11-23 (×4): qty 1

## 2013-11-23 MED ORDER — VANCOMYCIN HCL IN DEXTROSE 1-5 GM/200ML-% IV SOLN: 1000.0000 mg | INTRAVENOUS | Status: DC

## 2013-11-23 MED ORDER — KETAMINE HCL 50 MG/ML IJ SOLN
INTRAMUSCULAR | Status: AC | PRN
  Administered 2013-11-23: 100 mg via INTRAMUSCULAR

## 2013-11-23 MED ORDER — LACTULOSE ENEMA
300.0000 mL | Freq: Two times a day (BID) | ORAL | Status: DC
  Administered 2013-11-24: 300 mL via RECTAL
  Filled 2013-11-23 (×4): qty 300

## 2013-11-23 MED ORDER — ROCURONIUM BROMIDE 50 MG/5ML IV SOLN
INTRAVENOUS | Status: AC | PRN
  Administered 2013-11-23 (×2): 100 mg via INTRAVENOUS

## 2013-11-23 MED ORDER — PIPERACILLIN-TAZOBACTAM IN DEX 2-0.25 GM/50ML IV SOLN
2.2500 g | Freq: Three times a day (TID) | INTRAVENOUS | Status: DC
  Administered 2013-11-24 – 2013-11-27 (×12): 2.25 g via INTRAVENOUS
  Filled 2013-11-23 (×16): qty 50

## 2013-11-23 MED ORDER — KETAMINE HCL 10 MG/ML IJ SOLN
INTRAMUSCULAR | Status: AC | PRN
  Administered 2013-11-23: 100 mg via INTRAVENOUS

## 2013-11-23 MED ORDER — VANCOMYCIN HCL 10 G IV SOLR
1500.0000 mg | Freq: Once | INTRAVENOUS | Status: AC
  Administered 2013-11-24: 1500 mg via INTRAVENOUS
  Filled 2013-11-23: qty 1500

## 2013-11-23 MED ORDER — FOLIC ACID 5 MG/ML IJ SOLN
1.0000 mg | Freq: Every day | INTRAMUSCULAR | Status: DC
  Administered 2013-11-24 – 2013-11-27 (×4): 1 mg via INTRAVENOUS
  Filled 2013-11-23 (×6): qty 0.2

## 2013-11-23 MED ORDER — MIDAZOLAM HCL 5 MG/5ML IJ SOLN
INTRAMUSCULAR | Status: AC | PRN
  Administered 2013-11-23: 2 mg via INTRAVENOUS

## 2013-11-23 MED ORDER — FENTANYL CITRATE 0.05 MG/ML IJ SOLN
INTRAMUSCULAR | Status: AC | PRN
  Administered 2013-11-23: 50 ug via INTRAVENOUS

## 2013-11-23 MED ORDER — SODIUM CHLORIDE 0.9 % IV SOLN
INTRAVENOUS | Status: AC | PRN
  Administered 2013-11-23: 1000 mL via INTRAVENOUS

## 2013-11-23 MED ORDER — DOPAMINE-DEXTROSE 3.2-5 MG/ML-% IV SOLN
INTRAVENOUS | Status: AC
  Filled 2013-11-23: qty 250

## 2013-11-23 MED ORDER — PANTOPRAZOLE SODIUM 40 MG IV SOLR
40.0000 mg | Freq: Once | INTRAVENOUS | Status: DC
  Filled 2013-11-23: qty 40

## 2013-11-23 MED ORDER — SUCCINYLCHOLINE CHLORIDE 20 MG/ML IJ SOLN
INTRAMUSCULAR | Status: AC
Start: 2013-11-23 — End: 2013-11-24
  Filled 2013-11-23: qty 1

## 2013-11-23 MED ORDER — KETAMINE HCL 10 MG/ML IJ SOLN: 100.0000 mg | Freq: Once | INTRAMUSCULAR | Status: DC

## 2013-11-23 MED ORDER — DOPAMINE-DEXTROSE 3.2-5 MG/ML-% IV SOLN: 2.0000 ug/kg/min | INTRAVENOUS | Status: DC

## 2013-11-23 MED ORDER — ROCURONIUM BROMIDE 50 MG/5ML IV SOLN
INTRAVENOUS | Status: AC
  Filled 2013-11-23: qty 2

## 2013-11-23 MED ORDER — PANTOPRAZOLE SODIUM 40 MG IV SOLR
8.0000 mg/h | INTRAVENOUS | Status: DC
  Administered 2013-11-24: 8 mg/h via INTRAVENOUS
  Filled 2013-11-23 (×4): qty 80

## 2013-11-23 MED ORDER — PANTOPRAZOLE SODIUM 40 MG IV SOLR: 40.0000 mg | Freq: Two times a day (BID) | INTRAVENOUS | Status: DC

## 2013-11-23 MED ORDER — OCTREOTIDE LOAD VIA INFUSION
25.0000 ug | Freq: Once | INTRAVENOUS | Status: AC
  Administered 2013-11-23: 25 ug via INTRAVENOUS
  Filled 2013-11-23: qty 13

## 2013-11-23 MED ORDER — SODIUM CHLORIDE 0.9 % IV SOLN
80.0000 mg | Freq: Once | INTRAVENOUS | Status: AC
  Administered 2013-11-24: 80 mg via INTRAVENOUS
  Filled 2013-11-23: qty 80

## 2013-11-23 MED ORDER — SODIUM CHLORIDE 0.9 % IV SOLN
10.0000 ug/h | INTRAVENOUS | Status: DC
  Filled 2013-11-23: qty 50

## 2013-11-23 MED ORDER — DEXTROSE 5 % IV SOLN
1.0000 g | Freq: Once | INTRAVENOUS | Status: AC
  Administered 2013-11-24: 1 g via INTRAVENOUS
  Filled 2013-11-23: qty 10

## 2013-11-23 MED ORDER — ROCURONIUM BROMIDE 50 MG/5ML IV SOLN
100.0000 mg | Freq: Once | INTRAVENOUS | Status: AC
  Filled 2013-11-23: qty 10

## 2013-11-23 MED ORDER — SODIUM CHLORIDE 0.9 % IV SOLN
INTRAVENOUS | Status: DC
  Administered 2013-11-23: 1000 mL via INTRAVENOUS
  Administered 2013-11-24: 08:00:00 via INTRAVENOUS

## 2013-11-23 MED ORDER — SODIUM CHLORIDE 0.9 % IV SOLN
25.0000 ug/h | INTRAVENOUS | Status: DC
  Administered 2013-11-23: 25 ug/h via INTRAVENOUS
  Administered 2013-11-24: 50 ug/h via INTRAVENOUS
  Filled 2013-11-23 (×4): qty 1

## 2013-11-23 MED ORDER — PIPERACILLIN-TAZOBACTAM 3.375 G IVPB 30 MIN
3.3750 g | Freq: Once | INTRAVENOUS | Status: AC
  Administered 2013-11-24: 3.375 g via INTRAVENOUS
  Filled 2013-11-23: qty 50

## 2013-11-23 MED ORDER — OCTREOTIDE LOAD VIA INFUSION
50.0000 ug | Freq: Once | INTRAVENOUS | Status: AC
  Administered 2013-11-23: 50 ug via INTRAVENOUS
  Filled 2013-11-23: qty 25

## 2013-11-23 MED ORDER — DOPAMINE-DEXTROSE 1.6-5 MG/ML-% IV SOLN
2.0000 ug/kg/min | Freq: Once | INTRAVENOUS | Status: DC
  Filled 2013-11-23: qty 250

## 2013-11-23 MED ORDER — LIDOCAINE HCL (CARDIAC) 20 MG/ML IV SOLN
INTRAVENOUS | Status: AC
Start: 2013-11-23 — End: 2013-11-24
  Filled 2013-11-23: qty 5

## 2013-11-23 MED ORDER — ALBUTEROL SULFATE HFA 108 (90 BASE) MCG/ACT IN AERS: 8.0000 | INHALATION_SPRAY | RESPIRATORY_TRACT | Status: DC | PRN

## 2013-11-23 NOTE — ED Provider Notes (Signed)
CSN: [id number]     Arrival date & time 12/02/2013  2040 History   First MD Initiated Contact with Patient 12/06/2013 2119     Chief Complaint  Patient presents with  . Altered Mental Status     (Consider location/radiation/quality/duration/timing/severity/associated sxs/prior Treatment) HPI Comments: Hx limited as family not available and patient altered. EMS brought patient from home after family called. For past week has been continuously worseningly somnolent and altered. Normally AOx3, can do most ADLs. Does have chronic medical conditions including Breast Ca (unsure status) and cirrhosis. Hx of polysubstance abuse as well and alcoholism. Pt canot give hx on arrival. GCS 6 per EMS, has been vomiting.  Patient is a 53 y.o. female presenting with altered mental status. The history is provided by the patient. The history is limited by the condition of the patient.  Altered Mental Status Presenting symptoms: behavior changes, confusion, lethargy and unresponsiveness   Severity:  Severe Most recent episode:  More than 2 days ago Episode history:  Continuous Duration:  1 week Timing:  Constant Progression:  Worsening Chronicity:  Recurrent Context: alcohol use and drug use   Context: not homeless and not a recent illness   Associated symptoms: difficulty breathing and vomiting   Associated symptoms: no abdominal pain, no fever, no palpitations and no seizures     Past Medical History  Diagnosis Date  . Kidney stones 1989  . GERD (gastroesophageal reflux disease)   . History of stomach ulcers 2003/2007    Hx of esophageal ulcers and stomach ulcers due to reflux  . Back pain   . Chronic leg pain   . Polysubstance abuse   . Depression   . Anxiety   . Exertional shortness of breath   . History of blood transfusion     "recently had my 2nd; related to chemo" (01/21/2013)  . QIHKVQQV(956.3)     "monthly" (01/21/2013)  . Hepatic steatosis 09/26/2013  . Anemia, iron deficiency 09/26/2013  .  Macrocytosis 09/26/2013  . Alcoholism 09/27/2013    In recovery  . Alcoholic cirrhosis of liver 09/28/2013  . New onset seizure 10/08/2013  . Breast cancer 06/08/12    right mastectomy  . Infiltrating ductal carcinoma of breast 07/09/2012    Neijstrom:  chemo  . Aspiration pneumonia 12/15/2013   Past Surgical History  Procedure Laterality Date  . Esophagogastroduodenoscopy    . Esophageal dilation    . Portacath placement  07/22/2012    Procedure: INSERTION PORT-A-CATH;  Surgeon: Donato Heinz, MD;  Location: [facility];  Service: General;  Laterality: Left;  left subclavian  . Esophagogastroduodenoscopy (egd) with esophageal dilation  10/04/2005    Rourk-Extensive geographic ulcerations distal third of the tubular esoophagus consistent with severe ulcerative relux esophagitis, early stricture formation status post dilation as described above. 2.  Multiple gastric ulcers without bleeding stigmata as described above. otherwise normal stomach.  3. large bulbar ulcer without bleeding stigmata. otherwise D1 and D2 appeared normal.  . Colonoscopy, esophagogastroduodenoscopy (egd) and esophageal dilation  08/21/2012    Dr. Gala Romney: colonoscopy with internal hemorrhoids, friable anal canal, left-sided diverticulosis. EGD with question of eosinophilic esophagitis but NEGATIVE path. Dilation peformed with scope passage alone  . Breast biopsy Right 04/2012  . Mastectomy Right 06/08/12  . Tubal ligation  1989  . Femur im nail Right 01/21/2013    Procedure: INTRAMEDULLARY (IM) RETROGRADE FEMORAL NAILING;  Surgeon: Hessie Dibble, MD;  Location: [place];  Service: Orthopedics;  Laterality: Right;  . Orif ankle fracture  Left 09/15/2013    Procedure: OPEN REDUCTION INTERNAL FIXATION (ORIF) LEFTANKLE FRACTURE;  Surgeon: Sanjuana Kava, MD;  Location: [facility];  Service: Orthopedics;  Laterality: Left;  . Esophagogastroduodenoscopy N/A 11/01/2013    RMR: Narrowed, somewhat "ringed" appearing esophagus with marked fragility as  described above. Endoscopic findings suspicious for eosinophilic esophagitis (biopsies previously negative). No biopsies today because of pre-existing GI bleed and marked fragility of the esophagus.  Hiatal hernia   Family History  Problem Relation Age of Onset  . Heart attack Father   . COPD Father   . Alcohol abuse Brother   . Alcohol abuse Brother   . Diabetes Mother   . Colon cancer Neg Hx    History  Substance Use Topics  . Smoking status: Current Every Day Smoker -- 0.25 packs/day for 26 years    Last Attempt to Quit: 08/07/2011  . Smokeless tobacco: Never Used  . Alcohol Use: No     Comment: Patient denies current ETOH use but notes reveal +ETOH abuse in recent past   OB History   Grav Para Term Preterm Abortions TAB SAB Ect Mult Living   6 6        6      Review of Systems  Unable to perform ROS: Mental status change  Constitutional: Negative for fever.  Cardiovascular: Negative for palpitations.  Gastrointestinal: Positive for vomiting. Negative for abdominal pain.  Neurological: Negative for seizures.  Psychiatric/Behavioral: Positive for confusion.      Allergies  Citalopram; Effexor; Motrin; and Trazodone and nefazodone  Home Medications   Current Outpatient Rx  Name  Route  Sig  Dispense  Refill  . albuterol (PROVENTIL HFA;VENTOLIN HFA) 108 (90 BASE) MCG/ACT inhaler   Inhalation   Inhale 2 puffs into the lungs every 6 (six) hours as needed for wheezing.   1 Inhaler   2   . docusate sodium (COLACE) 100 MG capsule   Oral   Take 100 mg by mouth 2 (two) times daily.         . furosemide (LASIX) 40 MG tablet   Oral   Take 1 tablet (40 mg total) by mouth daily. Take for swelling.   30 tablet   2   . gabapentin (NEURONTIN) 300 MG capsule   Oral   Take 3 capsules (900 mg total) by mouth 2 (two) times daily.   60 capsule   2   . iron polysaccharides (NIFEREX) 150 MG capsule   Oral   Take 1 capsule (150 mg total) by mouth daily.   30 capsule    5   . Multiple Vitamins-Iron (MULTIVITAMINS WITH IRON) TABS tablet   Oral   Take 1 tablet by mouth daily.         Marland Kitchen omeprazole (PRILOSEC) 20 MG capsule   Oral   Take 1 capsule (20 mg total) by mouth 2 (two) times daily before a meal.         . spironolactone (ALDACTONE) 25 MG tablet   Oral   Take 1 tablet (25 mg total) by mouth daily. For generalized swelling.   30 tablet   2   . sucralfate (CARAFATE) 1 GM/10ML suspension   Oral   Take 10 mLs (1 g total) by mouth 4 (four) times daily -  with meals and at bedtime.   420 mL   0   . thiamine 100 MG tablet   Oral   Take 1 tablet (100 mg total) by mouth daily.         Marland Kitchen  Vitamin D, Ergocalciferol, (DRISDOL) 50000 UNITS CAPS capsule   Oral   Take 50,000 Units by mouth every 7 (seven) days.         . magnesium hydroxide (MILK OF MAGNESIA) 400 MG/5ML suspension   Oral   Take 30 mLs by mouth daily as needed for constipation.         Marland Kitchen oxyCODONE-acetaminophen (PERCOCET/ROXICET) 5-325 MG per tablet   Oral   Take 1 tablet by mouth every 12 (twelve) hours as needed for moderate pain or severe pain.         . polyethylene glycol (MIRALAX / GLYCOLAX) packet   Oral   Take 17 g by mouth daily.          BP 137/89  Pulse 124  Resp 16  Ht 4' 11.06" (1.5 m)  Wt 178 lb 9.2 oz (81 kg)  BMI 36.00 kg/m2  SpO2 93% Physical Exam  Constitutional: She appears well-developed and well-nourished. She appears distressed.  HENT:  Head: Normocephalic and atraumatic.  Vomitus in mouth, not adequately clearing vomit   Eyes: Conjunctivae are normal. Pupils are equal, round, and reactive to light. Right eye exhibits no discharge. Left eye exhibits no discharge. No scleral icterus.  Pupils 4 mm equal, not opening eyes with stimuli   Neck: Normal range of motion. Neck supple.  Cardiovascular: Regular rhythm and intact distal pulses.  Exam reveals no gallop and no friction rub.   No murmur heard. Tachy, regular  Pulmonary/Chest: She  is in respiratory distress. She has no wheezes. She has no rales.  Coarse throughout, hypoxic, poor resp effort  Abdominal: Soft. She exhibits no distension and no mass.  Musculoskeletal: Normal range of motion.  Neurological: She exhibits abnormal muscle tone (hypotonic).  Patient GCS 6, grimaces to pain, and moves slightly but non purposeful, and no large effort. Moaning occasionally. Does not open eyes. Largely hypotonic  Skin: She is not diaphoretic.    ED Course  INTUBATION Date/Time: 11/24/2013 12:01 AM Performed by: Sol Passer Authorized by: Osvaldo Shipper Consent: The procedure was performed in an emergent situation. Required items: required blood products, implants, devices, and special equipment available Patient identity confirmed: anonymous protocol, patient vented/unresponsive Time out: Immediately prior to procedure a "time out" was called to verify the correct patient, procedure, equipment, support staff and site/side marked as required. Indications: respiratory failure,  respiratory distress and  hypoxemia Intubation method: video-assisted Patient status: paralyzed (RSI) Preoxygenation: nonrebreather mask and BVM Pretreatment medications: none Sedatives: ketmine Paralytic: rocuronium Laryngoscope size: Mac 3 Tube size: 7.5 mm Tube type: cuffed Number of attempts: 2 Ventilation between attempts: BVM Cricoid pressure: yes Cords visualized: yes Post-procedure assessment: chest rise and ETCO2 monitor Breath sounds: equal Cuff inflated: yes ETT to lip: 24 cm Tube secured with: ETT holder and adhesive tape Chest x-ray interpreted by other physician, radiologist and me. Chest x-ray findings: endotracheal tube in appropriate position Patient tolerance: Patient tolerated the procedure well with no immediate complications. Comments: Pt hypoxemic, vomit in airway prior to RSI. Pt suctioned prior. Once fully relaxed, large amount of vomit and coming from trachea as  well. First attempt unsuccessful, but converted to Glidescope with success. Mingo Amber MD, attending, at bedside for procedure.   (including critical care time) Labs Review Labs Reviewed  CBC WITH DIFFERENTIAL - Abnormal; Notable for the following:    WBC 15.6 (*)    RBC 3.50 (*)    MCV 103.7 (*)    MCH 34.9 (*)    RDW 15.8 (*)  Neutrophils Relative % 78 (*)    Neutro Abs 12.2 (*)    Monocytes Absolute 1.3 (*)    All other components within normal limits  PROTIME-INR - Abnormal; Notable for the following:    Prothrombin Time 24.3 (*)    INR 2.27 (*)    All other components within normal limits  URINE RAPID DRUG SCREEN (HOSP PERFORMED) - Abnormal; Notable for the following:    Opiates POSITIVE (*)    All other components within normal limits  URINALYSIS, ROUTINE W REFLEX MICROSCOPIC - Abnormal; Notable for the following:    Color, Urine BROWN (*)    APPearance TURBID (*)    Hgb urine dipstick MODERATE (*)    Bilirubin Urine LARGE (*)    Ketones, ur 15 (*)    Protein, ur 30 (*)    Nitrite POSITIVE (*)    Leukocytes, UA LARGE (*)    All other components within normal limits  COMPREHENSIVE METABOLIC PANEL - Abnormal; Notable for the following:    Potassium 3.4 (*)    Chloride 94 (*)    BUN 30 (*)    Creatinine, Ser 3.76 (*)    Total Protein 5.2 (*)    Albumin 1.2 (*)    AST 70 (*)    Alkaline Phosphatase 221 (*)    Total Bilirubin 6.6 (*)    GFR calc non Af Amer 13 (*)    GFR calc Af Amer 15 (*)    All other components within normal limits  AMMONIA - Abnormal; Notable for the following:    Ammonia 79 (*)    All other components within normal limits  URINE MICROSCOPIC-ADD ON - Abnormal; Notable for the following:    Squamous Epithelial / LPF FEW (*)    Bacteria, UA MANY (*)    Casts HYALINE CASTS (*)    Crystals BILIRUBIN CRYSTALS (*)    All other components within normal limits  I-STAT CG4 LACTIC ACID, ED - Abnormal; Notable for the following:    Lactic Acid, Venous  4.59 (*)    All other components within normal limits  POC OCCULT BLOOD, ED - Abnormal; Notable for the following:    Fecal Occult Bld POSITIVE (*)    All other components within normal limits  I-STAT ARTERIAL BLOOD GAS, ED - Abnormal; Notable for the following:    pO2, Arterial 139.0 (*)    Bicarbonate 24.3 (*)    All other components within normal limits  CULTURE, BLOOD (ROUTINE X 2)  CULTURE, BLOOD (ROUTINE X 2)  URINE CULTURE  ETHANOL  CBC WITH DIFFERENTIAL  COMPREHENSIVE METABOLIC PANEL  PROTIME-INR  APTT  BLOOD GAS, ARTERIAL  LACTIC ACID, PLASMA  CORTISOL  URINALYSIS, ROUTINE W REFLEX MICROSCOPIC  PRO B NATRIURETIC PEPTIDE  CBC  BASIC METABOLIC PANEL  MAGNESIUM  PHOSPHORUS  HEMOGLOBIN AND HEMATOCRIT, BLOOD  HEMOGLOBIN AND HEMATOCRIT, BLOOD  MAGNESIUM  PHOSPHORUS  AMMONIA  HEMOGLOBIN AND HEMATOCRIT, BLOOD  HEMOGLOBIN AND HEMATOCRIT, BLOOD  I-STAT TROPOININ, ED  POC URINE PREG, ED  TYPE AND SCREEN  ABO/RH   Imaging Review Ct Head Wo Contrast  11/19/2013   CLINICAL DATA:  Altered mental status.  EXAM: CT HEAD WITHOUT CONTRAST  TECHNIQUE: Contiguous axial images were obtained from the base of the skull through the vertex without intravenous contrast.  COMPARISON:  CT scan dated 10/07/2013 and brain MRI dated 10/08/2013  FINDINGS: No mass lesion. No midline shift. No acute hemorrhage or hematoma. No extra-axial fluid collections. No evidence of acute infarction.  There are a few scattered periventricular white matter lucencies consistent with chronic small vessel ischemic disease. Ventricles are not dilated.  No acute osseous abnormality.  The patient does have new extensive secretions in the posterior nasopharynx with new fluid in the ethmoid and sphenoid sinuses.  IMPRESSION: No acute abnormality of the brain. Extensive secretions in the posterior nasopharynx extending into the paranasal sinuses.   Electronically Signed   By: Rozetta Nunnery M.D.   On: 11/18/2013 22:04   Dg  Chest Portable 1 View  11/17/2013   CLINICAL DATA:  Altered mental status.  EXAM: PORTABLE CHEST - 1 VIEW  COMPARISON:  10/31/2013  FINDINGS: Patient is slightly rotated to the left. Nasogastric tube courses through the region of the stomach and off the inferior portion of the film. A left subclavian Port-A-Cath is unchanged with tip overlying the region of the SVC. Endotracheal tube has tip 3 cm above the carina. Lungs are adequately inflated with subtle hazy density over the left base which may be due to combination of small amount of left pleural fluid with atelectasis, although cannot exclude infection. Cardiomediastinal silhouette and remainder the exam is unchanged.  IMPRESSION: Minimal hazy density left base which may be due to small amount of pleural fluid/ atelectasis although cannot exclude infection.   Electronically Signed   By: Marin Olp M.D.   On: 12/09/2013 22:44     EKG Interpretation None      MDM   MDM: 53 y.o. Female w/ PMHx of breast ca, cirrhosis, polysubstance abuse w/ cc: of AMS. Hx limited as patient altered, but gradual for past week now with vomiting, hypoxia, hypotension prior to arrival. On arrival, pt hypotensive, vomit in airway, not clearing. Pre-oxygenated and intubated. Pt has port from breast ca, and accessed and used for RSI. Labs, CXR, Head CT ordered. CXR shows good placement. Pt hypotensive, given fluids and started on Dopamine, then eventually levo. Fentanyl and Versed for sedation. Pt vomitus black, thought to be GI bleed, givne Octreotide and Protonix. Ceftriaxone given as well. Possibly septic with UTI present, given Vanc. Family initially not available but later arrived and they were made aware of her critical condition. Pt to be admitted to Critical Care for further treatment. Care of case d/w my attending.  Final diagnoses:  Acute respiratory failure with hypoxia  Altered mental status  Acute renal failure  UTI (urinary tract infection)  Septic shock     Admit to Critical Care   Sol Passer, MD 11/24/13 0006

## 2013-11-23 NOTE — ED Notes (Signed)
14FR OG tube inserted at 2100. Placement verified by air bolus by two RNs.

## 2013-11-23 NOTE — ED Notes (Signed)
I stat lactic acid results given to Dr. Walden by B. Kano Heckmann, EMT 

## 2013-11-23 NOTE — ED Notes (Signed)
Pt from [facility]. Is homebound and has [place] [facility]. Pt had a right mastectomy performed in 2013. Family reports pt has been in and out of consciousness for past two days. Statespt had a CT of head performed this am. Pt more lethargic and EMS called. Pt found to be semi conscious with BP of 48/28. EMS gave pt GCS of 4. Pt placed on NRB and saturating at 98%. Pt has a history of ETOH induced cirrhosis of the liver, hepatic encephalopathy.

## 2013-11-23 NOTE — ED Notes (Signed)
I stat troponin results given to Dr. Mingo Amber by B. Yolanda Bonine, EMT

## 2013-11-23 NOTE — Progress Notes (Signed)
ANTIBIOTIC CONSULT NOTE - INITIAL  Pharmacy Consult for Vancomycin and Zosyn  Indication: rule out pneumonia  Allergies  Allergen Reactions  . Citalopram     Makes her feel drowsy/sleepy.  . Effexor [Venlafaxine Hcl]     Makes pt feel very bad,will not take again.  . Motrin [Ibuprofen] Swelling    Lips swell  . Trazodone And Nefazodone     Makes pt feel very bad and "hung over"    Patient Measurements: Height: 4' 11.06" (150 cm) Weight: 178 lb 9.2 oz (81 kg) IBW/kg (Calculated) : 43.33  Vital Signs: BP: 115/80 mmHg (04/07 2305) Pulse Rate: 108 (04/07 2305) Intake/Output from previous day:   Intake/Output from this shift:    Labs:  Recent Labs  12/08/2013 2134  WBC 15.6*  HGB 12.2  PLT 233  CREATININE 3.76*   Estimated Creatinine Clearance: 16.3 ml/min (by C-G formula based on Cr of 3.76). No results found for this basename: Letta Median, VANCORANDOM, GENTTROUGH, GENTPEAK, GENTRANDOM, TOBRATROUGH, TOBRAPEAK, TOBRARND, AMIKACINPEAK, AMIKACINTROU, AMIKACIN,  in the last 72 hours   Microbiology: Recent Results (from the past 720 hour(s))  URINE CULTURE     Status: None   Collection Time    10/31/13  1:40 PM      Result Value Ref Range Status   Specimen Description URINE, CLEAN CATCH   Final   Special Requests NONE   Final   Culture  Setup Time     Final   Value: 10/31/2013 21:17     Performed at [facility]     Final   Value: 70,000 COLONIES/ML     Performed at [facility]   Culture     Final   Value: YEAST     Performed at [facility]   Report Status 11/02/2013 FINAL   Final    Medical History: Past Medical History  Diagnosis Date  . Kidney stones 1989  . GERD (gastroesophageal reflux disease)   . History of stomach ulcers 2003/2007    Hx of esophageal ulcers and stomach ulcers due to reflux  . Back pain   . Chronic leg pain   . Polysubstance abuse   . Depression   . Anxiety   . Exertional  shortness of breath   . History of blood transfusion     "recently had my 2nd; related to chemo" (01/21/2013)  . KNLZJQBH(419.3)     "monthly" (01/21/2013)  . Hepatic steatosis 09/26/2013  . Anemia, iron deficiency 09/26/2013  . Macrocytosis 09/26/2013  . Alcoholism 09/27/2013    In recovery  . Alcoholic cirrhosis of liver 09/28/2013  . New onset seizure 10/08/2013  . Breast cancer 06/08/12    right mastectomy  . Infiltrating ductal carcinoma of breast 07/09/2012    Neijstrom:  chemo  . Aspiration pneumonia 11/18/2013    Medications:   Assessment: 53 yo female with AMS/GI bleed, possible PNA, for empiric antibiotics   Goal of Therapy:  Vancomycin trough level 15-20 mcg/ml  Plan:  Vancomycin 1500 mg IV and Zosyn 3.375 g IV as ordered in ED, then vancomycin 1  g IV q72h and Zosyn 2.25 g IV q8h F/U renal function    Caryl Pina 12/11/2013,11:37 PM

## 2013-11-23 NOTE — H&P (Signed)
PULMONARY / CRITICAL CARE MEDICINE   Name: Elizabeth Orr MRN: [id number] DOB: 1960-09-28    ADMISSION DATE:  12/09/2013 CONSULTATION DATE:  12/03/2013  REFERRING MD :  EDP PRIMARY SERVICE: PCCM  CHIEF COMPLAINT:  Abdominal pain  BRIEF PATIENT DESCRIPTION: 53 year old female with Hx of breast ca and alcoholic cirrhosis presented 4/7 with 2 day history of altered mental status. In ED was hypotensive 48/28 and GCS 4. Occult blood positive. Intubated in ED. PCCM asked to admit.   SIGNIFICANT EVENTS / STUDIES:  CT Head 4/7 > No acute abnormality   LINES / TUBES: OETT 4/7 >>> CVL 4/7 >>> OGT 4/7 >>>  CULTURES: Blood 4/7 >>> Urine 4/7 >>> Sputum 4/7 >>>  ANTIBIOTICS: Pip/tazo 4/7 >>> Vancomycin 4/7 >>>  HISTORY OF PRESENT ILLNESS:  53 year old female with PMH as below, which includes breast Ca s/p R mastectomy, alcoholic cirrhosis, and GI bleed. Recent admission for GI bleed in which EGD showed no acute upper GI bleed. Presents 4/7 with 2 day history of altered mental status. Family reports she has been in and out of conciousness during that time period. Symptoms worsened 4/7 so they came to ED. She was hypotensive in ED with a GCS of 4 so she was intubated. No frank GI blood noted, but dark emesis and positive hemoccult noted. PCCM asked to admit.   PAST MEDICAL HISTORY :  Past Medical History  Diagnosis Date  . Kidney stones 1989  . GERD (gastroesophageal reflux disease)   . History of stomach ulcers 2003/2007    Hx of esophageal ulcers and stomach ulcers due to reflux  . Back pain   . Chronic leg pain   . Polysubstance abuse   . Depression   . Anxiety   . Exertional shortness of breath   . History of blood transfusion     "recently had my 2nd; related to chemo" (01/21/2013)  . BJSEGBTD(176.1)     "monthly" (01/21/2013)  . Hepatic steatosis 09/26/2013  . Anemia, iron deficiency 09/26/2013  . Macrocytosis 09/26/2013  . Alcoholism 09/27/2013    In recovery  . Alcoholic cirrhosis of  liver 09/28/2013  . New onset seizure 10/08/2013  . Breast cancer 06/08/12    right mastectomy  . Infiltrating ductal carcinoma of breast 07/09/2012    Neijstrom:  chemo   Past Surgical History  Procedure Laterality Date  . Esophagogastroduodenoscopy    . Esophageal dilation    . Portacath placement  07/22/2012    Procedure: INSERTION PORT-A-CATH;  Surgeon: Donato Heinz, MD;  Location: [facility];  Service: General;  Laterality: Left;  left subclavian  . Esophagogastroduodenoscopy (egd) with esophageal dilation  10/04/2005    Rourk-Extensive geographic ulcerations distal third of the tubular esoophagus consistent with severe ulcerative relux esophagitis, early stricture formation status post dilation as described above. 2.  Multiple gastric ulcers without bleeding stigmata as described above. otherwise normal stomach.  3. large bulbar ulcer without bleeding stigmata. otherwise D1 and D2 appeared normal.  . Colonoscopy, esophagogastroduodenoscopy (egd) and esophageal dilation  08/21/2012    Dr. Gala Romney: colonoscopy with internal hemorrhoids, friable anal canal, left-sided diverticulosis. EGD with question of eosinophilic esophagitis but NEGATIVE path. Dilation peformed with scope passage alone  . Breast biopsy Right 04/2012  . Mastectomy Right 06/08/12  . Tubal ligation  1989  . Femur im nail Right 01/21/2013    Procedure: INTRAMEDULLARY (IM) RETROGRADE FEMORAL NAILING;  Surgeon: Hessie Dibble, MD;  Location: [facility];  Service: Orthopedics;  Laterality: Right;  . Orif ankle fracture Left 09/15/2013    Procedure: OPEN REDUCTION INTERNAL FIXATION (ORIF) LEFTANKLE FRACTURE;  Surgeon: Sanjuana Kava, MD;  Location: [facility];  Service: Orthopedics;  Laterality: Left;  . Esophagogastroduodenoscopy N/A 11/01/2013    RMR: Narrowed, somewhat "ringed" appearing esophagus with marked fragility as described above. Endoscopic findings suspicious for eosinophilic esophagitis (biopsies previously negative). No biopsies  today because of pre-existing GI bleed and marked fragility of the esophagus.  Hiatal hernia   Prior to Admission medications   Medication Sig Start Date End Date Taking? Authorizing Provider  albuterol (PROVENTIL HFA;VENTOLIN HFA) 108 (90 BASE) MCG/ACT inhaler Inhale 2 puffs into the lungs every 6 (six) hours as needed for wheezing. 09/28/13   Rexene Alberts, MD  docusate sodium (COLACE) 100 MG capsule Take 100 mg by mouth 2 (two) times daily.    Historical Provider, MD  furosemide (LASIX) 40 MG tablet Take 1 tablet (40 mg total) by mouth daily. Take for swelling. 09/28/13   Rexene Alberts, MD  gabapentin (NEURONTIN) 300 MG capsule Take 3 capsules (900 mg total) by mouth 2 (two) times daily. 09/28/13   Rexene Alberts, MD  iron polysaccharides (NIFEREX) 150 MG capsule Take 1 capsule (150 mg total) by mouth daily. 09/28/13   Rexene Alberts, MD  magnesium hydroxide (MILK OF MAGNESIA) 400 MG/5ML suspension Take 30 mLs by mouth daily as needed for constipation.    Historical Provider, MD  Multiple Vitamins-Iron (MULTIVITAMINS WITH IRON) TABS tablet Take 1 tablet by mouth daily. 09/28/13   Rexene Alberts, MD  omeprazole (PRILOSEC) 20 MG capsule Take 1 capsule (20 mg total) by mouth 2 (two) times daily before a meal. 11/04/13   Samuella Cota, MD  oxyCODONE-acetaminophen (PERCOCET/ROXICET) 5-325 MG per tablet Take 1 tablet by mouth every 12 (twelve) hours as needed for moderate pain or severe pain. 10/11/13   Samuella Cota, MD  polyethylene glycol [facility] / Floria Raveling) packet Take 17 g by mouth daily.    Historical Provider, MD  spironolactone (ALDACTONE) 25 MG tablet Take 1 tablet (25 mg total) by mouth daily. For generalized swelling. 09/28/13   Rexene Alberts, MD  sucralfate (CARAFATE) 1 GM/10ML suspension Take 10 mLs (1 g total) by mouth 4 (four) times daily -  with meals and at bedtime. 11/04/13   Samuella Cota, MD  thiamine 100 MG tablet Take 1 tablet (100 mg total) by mouth daily. 09/28/13   Rexene Alberts, MD  Vitamin D, Ergocalciferol, (DRISDOL) 50000 UNITS CAPS capsule Take 50,000 Units by mouth every 7 (seven) days.    Historical Provider, MD   Allergies  Allergen Reactions  . Citalopram     Makes her feel drowsy/sleepy.  . Effexor [Venlafaxine Hcl]     Makes pt feel very bad,will not take again.  . Motrin [Ibuprofen] Swelling    Lips swell  . Trazodone And Nefazodone     Makes pt feel very bad and "hung over"    FAMILY HISTORY:  Family History  Problem Relation Age of Onset  . Heart attack Father   . COPD Father   . Alcohol abuse Brother   . Alcohol abuse Brother   . Diabetes Mother   . Colon cancer Neg Hx    SOCIAL HISTORY:  reports that she has been smoking.  She has never used smokeless tobacco. She reports that she does not drink alcohol or use illicit drugs.  REVIEW OF SYSTEMS:  Unable as patient is intubated.   SUBJECTIVE:  VITAL SIGNS: Pulse Rate:  [117-144] 144 (04/07 2145) Resp:  [16-28] 18 (04/07 2145) BP: (84-114)/(59-95) 113/59 mmHg (04/07 2145) SpO2:  [88 %-100 %] 100 % (04/07 2145) FiO2 (%):  [100 %] 100 % (04/07 2117) Weight:  [81 kg (178 lb 9.2 oz)] 81 kg (178 lb 9.2 oz) (04/07 2102) HEMODYNAMICS:   VENTILATOR SETTINGS: Vent Mode:  [-] PRVC FiO2 (%):  [100 %] 100 % Set Rate:  [18 bmp] 18 bmp Vt Set:  [500 mL] 500 mL PEEP:  [5 cmH20] 5 cmH20 Plateau Pressure:  [30 cmH20] 30 cmH20 INTAKE / OUTPUT: Intake/Output   None     PHYSICAL EXAMINATION: General:  Overweight female (178lbs) intubated.  Neuro:  Obtunded on vent HEENT:  [place]/AT no JVD noted Cardiovascular:  RRR Lungs:  Rhonchi throughout Abdomen:  Soft, non-distended Musculoskeletal:  NO acute deformity Skin:  Pressure ulcers, 2+ pitting peripheral edema   LABS:  CBC  Recent Labs Lab 12/11/2013 2134  WBC 15.6*  HGB 12.2  HCT 36.3  PLT 233   Coag's  Recent Labs Lab 12/06/2013 2134  INR 2.27*   BMET  Recent Labs Lab 11/27/2013 2134  NA 137  K 3.4*  CL 94*   CO2 22  BUN 30*  CREATININE 3.76*  GLUCOSE 95   Electrolytes  Recent Labs Lab 12/13/2013 2134  CALCIUM 8.4   Sepsis Markers  Recent Labs Lab 12/05/2013 2114  LATICACIDVEN 4.59*   ABG  Recent Labs Lab 12/08/2013 2213  PHART 7.371  PCO2ART 41.6  PO2ART 139.0*   Liver Enzymes  Recent Labs Lab 12/04/2013 2134  AST 70*  ALT 15  ALKPHOS 221*  BILITOT 6.6*  ALBUMIN 1.2*   Cardiac Enzymes No results found for this basename: TROPONINI, PROBNP,  in the last 168 hours Glucose No results found for this basename: GLUCAP,  in the last 168 hours  Imaging Ct Head Wo Contrast  12/11/2013   CLINICAL DATA:  Altered mental status.  EXAM: CT HEAD WITHOUT CONTRAST  TECHNIQUE: Contiguous axial images were obtained from the base of the skull through the vertex without intravenous contrast.  COMPARISON:  CT scan dated 10/07/2013 and brain MRI dated 10/08/2013  FINDINGS: No mass lesion. No midline shift. No acute hemorrhage or hematoma. No extra-axial fluid collections. No evidence of acute infarction. There are a few scattered periventricular white matter lucencies consistent with chronic small vessel ischemic disease. Ventricles are not dilated.  No acute osseous abnormality.  The patient does have new extensive secretions in the posterior nasopharynx with new fluid in the ethmoid and sphenoid sinuses.  IMPRESSION: No acute abnormality of the brain. Extensive secretions in the posterior nasopharynx extending into the paranasal sinuses.   Electronically Signed   By: Rozetta Nunnery M.D.   On: 11/27/2013 22:04   Dg Chest Portable 1 View  12/13/2013   CLINICAL DATA:  Altered mental status.  EXAM: PORTABLE CHEST - 1 VIEW  COMPARISON:  10/31/2013  FINDINGS: Patient is slightly rotated to the left. Nasogastric tube courses through the region of the stomach and off the inferior portion of the film. A left subclavian Port-A-Cath is unchanged with tip overlying the region of the SVC. Endotracheal tube has tip 3  cm above the carina. Lungs are adequately inflated with subtle hazy density over the left base which may be due to combination of small amount of left pleural fluid with atelectasis, although cannot exclude infection. Cardiomediastinal silhouette and remainder the exam is unchanged.  IMPRESSION: Minimal hazy density left base  which may be due to small amount of pleural fluid/ atelectasis although cannot exclude infection.   Electronically Signed   By: Marin Olp M.D.   On: 12/13/2013 22:44     ASSESSMENT / PLAN:  PULMONARY A: Acute respiratory failure Probable aspiration event  P:   - Full ventilatory support - Pulmonary hygeine - Follow CXR - Follow ABG - Increase PEEP to 10 (aspirated during intubation and prior to it). - Adjust vent for ABG.  CARDIOVASCULAR A:  Hemorrhagic Shock  2/2 GI bleed  P:  - Aggressive IVF resuscitation. - Follow CBC q6 hours. - Transfuse if H/H < 7/21. - Follow lactate. - Check BNP. - 2D echo ordered, ?cardiac function. - Levophed for BP support. - If family wants aggressive support then will place TLC and follow CVP.  RENAL A:   Acute Renal Failure baseline creat 0.5  P:   - Aggressive IVF. - Reassess BMP after adequately volume resuscitated.  GASTROINTESTINAL A:   GI bleed no frank blood per rectum or via NG, hemoccult positive.  Alcoholic cirrhosis  P:   - Octreotide gtt. - Protonix gtt. - GI to see in AM. - Lactulose PR given upper GI bleeding. - Recheck ammonia in AM. - Thiamine, folic acid, MVI.  HEMATOLOGIC A:   Coagulopathy 2/2 cirrhosis  Leukocytosis  P:  - Check Coags. - Monitor CBC. - Vit K. - Coags in AM.  INFECTIOUS A:   Aspiration PNA recent hospitalization with last 30 days.   P:   - Empiric ABX and cultures as above. - Follow CBC and Fever curve.  ENDOCRINE A:  No known issues P:   - Supportive Care.  NEUROLOGIC A:  Acute encephalopathy P:   - Monitor - No sedation necessary at this  time. - Thiamine/folate/MVI. - CIWA.  TODAY'S SUMMARY: 89 alcoholic female in liver failure, renal failure, respiratory failure, breast cancer and hepatic encephalopathy.  Shock likely due to hemorrhage and sedating medications.  There is a question whether family wants all this done.  Will need to speak to them upon arrival and pending conversation will determine plan of care.  I have personally obtained a history, examined the patient, evaluated laboratory and imaging results, formulated the assessment and plan and placed orders.  CRITICAL CARE: The patient is critically ill with multiple organ systems failure and requires high complexity decision making for assessment and support, frequent evaluation and titration of therapies, application of advanced monitoring technologies and extensive interpretation of multiple databases. Critical Care Time devoted to patient care services described in this note is 45 minutes.   Rush Farmer, M.D. [facility] Pulmonary/Critical Care Medicine. Pager: [phone]. After hours pager: [phone].

## 2013-11-23 NOTE — ED Notes (Signed)
14Fr temp foley inserted at 2200

## 2013-11-24 ENCOUNTER — Inpatient Hospital Stay (HOSPITAL_COMMUNITY): Payer: Medicaid Other

## 2013-11-24 DIAGNOSIS — N179 Acute kidney failure, unspecified: Secondary | ICD-10-CM

## 2013-11-24 DIAGNOSIS — E43 Unspecified severe protein-calorie malnutrition: Secondary | ICD-10-CM | POA: Insufficient documentation

## 2013-11-24 DIAGNOSIS — R072 Precordial pain: Secondary | ICD-10-CM

## 2013-11-24 LAB — POCT I-STAT 3, ART BLOOD GAS (G3+)
Acid-base deficit: 8 mmol/L — ABNORMAL HIGH (ref 0.0–2.0)
Acid-base deficit: 9 mmol/L — ABNORMAL HIGH (ref 0.0–2.0)
BICARBONATE: 16.8 meq/L — AB (ref 20.0–24.0)
Bicarbonate: 21.2 mEq/L (ref 20.0–24.0)
O2 SAT: 86 %
O2 Saturation: 83 %
PH ART: 7.274 — AB (ref 7.350–7.450)
Patient temperature: 98.6
Patient temperature: 37
TCO2: 23 mmol/L (ref 0–100)
TCO2: 18 mmol/L (ref 0–100)
pCO2 arterial: 55.1 mmHg — ABNORMAL HIGH (ref 35.0–45.0)
pCO2 arterial: 36.3 mmHg (ref 35.0–45.0)
pH, Arterial: 7.193 — CL (ref 7.350–7.450)
pO2, Arterial: 64 mmHg — ABNORMAL LOW (ref 80.0–100.0)
pO2, Arterial: 54 mmHg — ABNORMAL LOW (ref 80.0–100.0)

## 2013-11-24 LAB — URINALYSIS, ROUTINE W REFLEX MICROSCOPIC
Glucose, UA: NEGATIVE mg/dL
Ketones, ur: 15 mg/dL — AB
Nitrite: POSITIVE — AB
PROTEIN: 100 mg/dL — AB
SPECIFIC GRAVITY, URINE: 1.033 — AB (ref 1.005–1.030)
UROBILINOGEN UA: 1 mg/dL (ref 0.0–1.0)
pH: 5 (ref 5.0–8.0)

## 2013-11-24 LAB — BLOOD GAS, ARTERIAL
Acid-base deficit: 6.9 mmol/L — ABNORMAL HIGH (ref 0.0–2.0)
BICARBONATE: 22.1 meq/L (ref 20.0–24.0)
DRAWN BY: 40415
FIO2: 1 %
LHR: 16 {breaths}/min
MECHVT: 350 mL
O2 Saturation: 97.4 %
PCO2 ART: 81.2 mmHg — AB (ref 35.0–45.0)
PEEP: 5 cmH2O
Patient temperature: 98.6
TCO2: 24.6 mmol/L (ref 0–100)
pH, Arterial: 7.063 — CL (ref 7.350–7.450)
pO2, Arterial: 136 mmHg — ABNORMAL HIGH (ref 80.0–100.0)

## 2013-11-24 LAB — HEMOGLOBIN AND HEMATOCRIT, BLOOD
HCT: 34.4 % — ABNORMAL LOW (ref 36.0–46.0)
HCT: 36.2 % (ref 36.0–46.0)
HCT: 36.9 % (ref 36.0–46.0)
HCT: 38 % (ref 36.0–46.0)
HEMOGLOBIN: 11.3 g/dL — AB (ref 12.0–15.0)
HEMOGLOBIN: 12 g/dL (ref 12.0–15.0)
Hemoglobin: 11.5 g/dL — ABNORMAL LOW (ref 12.0–15.0)
Hemoglobin: 12.4 g/dL (ref 12.0–15.0)

## 2013-11-24 LAB — LACTIC ACID, PLASMA
LACTIC ACID, VENOUS: 4.8 mmol/L — AB (ref 0.5–2.2)
Lactic Acid, Venous: 5.6 mmol/L — ABNORMAL HIGH (ref 0.5–2.2)

## 2013-11-24 LAB — BASIC METABOLIC PANEL
BUN: 28 mg/dL — AB (ref 6–23)
CHLORIDE: 97 meq/L (ref 96–112)
CO2: 17 mEq/L — ABNORMAL LOW (ref 19–32)
Calcium: 7.5 mg/dL — ABNORMAL LOW (ref 8.4–10.5)
Creatinine, Ser: 3.59 mg/dL — ABNORMAL HIGH (ref 0.50–1.10)
GFR calc Af Amer: 16 mL/min — ABNORMAL LOW (ref >90)
GFR calc non Af Amer: 14 mL/min — ABNORMAL LOW (ref >90)
GLUCOSE: 132 mg/dL — AB (ref 70–99)
POTASSIUM: 3.3 meq/L — AB (ref 3.7–5.3)
Sodium: 136 mEq/L — ABNORMAL LOW (ref 137–147)

## 2013-11-24 LAB — GLUCOSE, CAPILLARY
GLUCOSE-CAPILLARY: 97 mg/dL (ref 70–99)
Glucose-Capillary: 132 mg/dL — ABNORMAL HIGH (ref 70–99)

## 2013-11-24 LAB — AMMONIA: AMMONIA: 155 umol/L — AB (ref 11–60)

## 2013-11-24 LAB — URINE MICROSCOPIC-ADD ON

## 2013-11-24 LAB — TROPONIN I: Troponin I: <0.3 ng/mL (ref <0.30)

## 2013-11-24 LAB — CORTISOL: Cortisol, Plasma: 54.4 ug/dL

## 2013-11-24 LAB — MRSA PCR SCREENING: MRSA by PCR: NEGATIVE

## 2013-11-24 LAB — ABO/RH: ABO/RH(D): O POS

## 2013-11-24 MED ORDER — BIOTENE DRY MOUTH MT LIQD
15.0000 mL | Freq: Two times a day (BID) | OROMUCOSAL | Status: DC
  Administered 2013-11-24 – 2013-11-26 (×5): 15 mL via OROMUCOSAL

## 2013-11-24 MED ORDER — RIFAXIMIN 550 MG PO TABS
550.0000 mg | ORAL_TABLET | Freq: Two times a day (BID) | ORAL | Status: DC
  Administered 2013-11-24 – 2013-11-27 (×8): 550 mg via ORAL
  Filled 2013-11-24 (×9): qty 1

## 2013-11-24 MED ORDER — ADULT MULTIVITAMIN LIQUID CH
5.0000 mL | Freq: Every day | ORAL | Status: DC
  Administered 2013-11-24 – 2013-11-27 (×4): 5 mL
  Filled 2013-11-24 (×4): qty 5

## 2013-11-24 MED ORDER — POTASSIUM CHLORIDE 20 MEQ/15ML (10%) PO LIQD
40.0000 meq | Freq: Once | ORAL | Status: AC
  Administered 2013-11-24: 40 meq
  Filled 2013-11-24: qty 30

## 2013-11-24 MED ORDER — SODIUM CHLORIDE 0.9 % IV BOLUS (SEPSIS)
1000.0000 mL | Freq: Once | INTRAVENOUS | Status: AC
  Administered 2013-11-24: 1000 mL via INTRAVENOUS

## 2013-11-24 MED ORDER — PRO-STAT SUGAR FREE PO LIQD
30.0000 mL | Freq: Three times a day (TID) | ORAL | Status: DC
  Administered 2013-11-24 – 2013-11-27 (×7): 30 mL
  Filled 2013-11-24 (×11): qty 30

## 2013-11-24 MED ORDER — VITAL AF 1.2 CAL PO LIQD
1000.0000 mL | ORAL | Status: DC
  Administered 2013-11-24: 1000 mL
  Filled 2013-11-24 (×8): qty 1000

## 2013-11-24 MED ORDER — ALBUTEROL SULFATE (2.5 MG/3ML) 0.083% IN NEBU: 5.0000 mg | INHALATION_SOLUTION | RESPIRATORY_TRACT | Status: DC | PRN

## 2013-11-24 MED ORDER — CHLORHEXIDINE GLUCONATE 0.12 % MT SOLN
15.0000 mL | Freq: Two times a day (BID) | OROMUCOSAL | Status: DC
  Administered 2013-11-24 – 2013-11-27 (×8): 15 mL via OROMUCOSAL
  Filled 2013-11-24 (×8): qty 15

## 2013-11-24 MED ORDER — LACTULOSE 10 GM/15ML PO SOLN
30.0000 g | Freq: Three times a day (TID) | ORAL | Status: DC
  Administered 2013-11-24 – 2013-11-26 (×6): 30 g via ORAL
  Filled 2013-11-24 (×9): qty 45

## 2013-11-24 MED ORDER — PANTOPRAZOLE SODIUM 40 MG IV SOLR
40.0000 mg | Freq: Two times a day (BID) | INTRAVENOUS | Status: DC
  Administered 2013-11-24 – 2013-11-27 (×8): 40 mg via INTRAVENOUS
  Filled 2013-11-24 (×8): qty 40

## 2013-11-24 NOTE — Progress Notes (Signed)
[facility] Progress Note Patient Name: Elizabeth Orr DOB: 1961-08-04 MRN: [id number]  Date of Service  11/24/2013   HPI/Events of Note     eICU Interventions  Potassium replaced   Intervention Category Intermediate Interventions: Electrolyte abnormality - evaluation and management  Collene Gobble 11/24/2013, 9:09 PM

## 2013-11-24 NOTE — Progress Notes (Signed)
240 cc Fentanyl drip and 40 cc Versed drip wasted with Sharren Bridge, RN  Dion Body

## 2013-11-24 NOTE — Progress Notes (Signed)
Echocardiogram 2D Echocardiogram has been performed.  Elizabeth Orr 11/24/2013, 3:08 PM

## 2013-11-24 NOTE — Progress Notes (Signed)
INITIAL NUTRITION ASSESSMENT  Pt meets criteria for SEVERE MALNUTRITION in the context of chronic illness as evidenced by 25% weight loss x 1 1/2 months, intake </= 75% of her needs for >/= 1 month.  DOCUMENTATION CODES Per approved criteria  -Severe malnutrition in the context of chronic illness   INTERVENTION: 1. Initiate Vital AF 1.2 @ 20 ml/hr via OG tube and increase by 10 ml every 8 hours to goal rate of 40 ml/hr.   2. 30 ml Prostat TID.  3. Monitor magnesium, potassium, and phosphorus daily for at least 3 days, MD to replete as needed, as pt is at risk for refeeding syndrome given recent hx of weight loss.  At goal rate, tube feeding regimen will provide 1452 kcal, 117 grams of protein, and 778 ml of H2O.   NUTRITION DIAGNOSIS: Inadequate oral intake related to inability to eat as evidenced by NPO status  Goal: Pt to meet >/= 90% of their estimated nutrition needs   Monitor:  Plan of care, TF tolerance, weight trend, labs  Reason for Assessment: Consult received to initiate and manage enteral nutrition support.  53 y.o. female  Admitting Dx: <principal problem not specified>  ASSESSMENT: 53 year old female with Hx of breast ca and alcoholic cirrhosis presented 4/7 with 2 day history of altered mental status. In ED was hypotensive 48/28 and GCS 4. Occult blood positive. Intubated in ED.  Per husband and family pt has lost about 50 lb in the last month and a 1/2. Pt was weighing in the 200's but had lost down to 145-150 lb. Current weight reflects fluid (per husband edema started yesterday. Pt had recently had esophageal surgery and was on a liquid diet PTA. Husband was preparing her food and was giving her a banana and chocolate milk shake at Breakfast, Lunch was a milkshake with apple, dinner was an ensure with blended carrots, green beans, and corn. Per husband pt has not been drinking in the last 6 months.  Pt with 25% weight loss in the last 1 1/2 months. Husband  describes inadequate intake in the same time frame. Pt likely not obese, current BMI reflects edema.  Nutrition-focused physical exam does not reflect muscle or fat loss due to edema.   Patient is currently intubated on ventilator support MV: 11.2 L/min Temp (24hrs), Avg:97.9 F (36.6 C), Min:97.3 F (36.3 C), Max:98.6 F (37 C)  Potassium low, Ammonia WNL.  Pt on thiamine and folic acid.   Height: Ht Readings from Last 1 Encounters:  11/24/13 5\' 1"  (1.549 m)    Weight: Wt Readings from Last 1 Encounters:  11/24/13 177 lb 11.1 oz (80.6 kg)  4+ edema (generalized)  Ideal Body Weight: 47.7  % Ideal Body Weight: 169%  Wt Readings from Last 10 Encounters:  11/24/13 177 lb 11.1 oz (80.6 kg)  10/31/13 178 lb 12.7 oz (81.1 kg)  10/31/13 178 lb 12.7 oz (81.1 kg)  10/09/13 191 lb 12.8 oz (87 kg)  09/28/13 222 lb 10.6 oz (101 kg)  09/15/13 215 lb 9.6 oz (97.796 kg)  09/15/13 215 lb 9.6 oz (97.796 kg)  07/06/13 203 lb 6.4 oz (92.262 kg)  06/04/13 204 lb (92.534 kg)  02/24/13 206 lb (93.441 kg)    Usual Body Weight: 200's per husband  % Usual Body Weight: 89%  BMI:  Body mass index is 33.59 kg/(m^2).  Estimated Nutritional Needs: Kcal: 1501 Protein: 100-115 grams Fluid: > 1.5 L/day  Skin: stage II on buttocks; stage II ankle; incision  right knee   Diet Order: NPO  EDUCATION NEEDS: -No education needs identified at this time   Intake/Output Summary (Last 24 hours) at 11/24/13 1230 Last data filed at 11/24/13 1201  Gross per 24 hour  Intake 4015.12 ml  Output     15 ml  Net 4000.12 ml    Last BM: 4/8 - rectal pouch inserted 4/8  Labs:   Recent Labs Lab 12/06/2013 2134  NA 137  K 3.4*  CL 94*  CO2 22  BUN 30*  CREATININE 3.76*  CALCIUM 8.4  GLUCOSE 95    CBG (last 3)  No results found for this basename: GLUCAP,  in the last 72 hours  Scheduled Meds: . antiseptic oral rinse  15 mL Mouth Rinse q12n4p  . chlorhexidine  15 mL Mouth Rinse BID  .  folic acid  1 mg Intravenous Daily  . lactulose  300 mL Rectal BID  . [START ON 11/27/2013] pantoprazole (PROTONIX) IV  40 mg Intravenous Q12H  . pantoprazole (PROTONIX) IV  40 mg Intravenous Q12H  . piperacillin-tazobactam (ZOSYN)  IV  2.25 g Intravenous 3 times per day  . sodium chloride  1,000 mL Intravenous Once  . thiamine  100 mg Intravenous Daily  . [START ON 11/26/2013] vancomycin  1,000 mg Intravenous Q72H    Continuous Infusions: . sodium chloride 100 mL/hr at 11/24/13 0828  . norepinephrine (LEVOPHED) Adult infusion 12 mcg/min (11/24/13 1201)  . pantoprozole (PROTONIX) infusion 8 mg/hr (11/24/13 0800)    Past Medical History  Diagnosis Date  . Kidney stones 1989  . GERD (gastroesophageal reflux disease)   . History of stomach ulcers 2003/2007    Hx of esophageal ulcers and stomach ulcers due to reflux  . Back pain   . Chronic leg pain   . Polysubstance abuse   . Depression   . Anxiety   . Exertional shortness of breath   . History of blood transfusion     "recently had my 2nd; related to chemo" (01/21/2013)  . CHENIDPO(242.3)     "monthly" (01/21/2013)  . Hepatic steatosis 09/26/2013  . Anemia, iron deficiency 09/26/2013  . Macrocytosis 09/26/2013  . Alcoholism 09/27/2013    In recovery  . Alcoholic cirrhosis of liver 09/28/2013  . New onset seizure 10/08/2013  . Breast cancer 06/08/12    right mastectomy  . Infiltrating ductal carcinoma of breast 07/09/2012    Neijstrom:  chemo  . Aspiration pneumonia 11/27/2013    Past Surgical History  Procedure Laterality Date  . Esophagogastroduodenoscopy    . Esophageal dilation    . Portacath placement  07/22/2012    Procedure: INSERTION PORT-A-CATH;  Surgeon: Donato Heinz, MD;  Location: [facility];  Service: General;  Laterality: Left;  left subclavian  . Esophagogastroduodenoscopy (egd) with esophageal dilation  10/04/2005    Rourk-Extensive geographic ulcerations distal third of the tubular esoophagus consistent with severe  ulcerative relux esophagitis, early stricture formation status post dilation as described above. 2.  Multiple gastric ulcers without bleeding stigmata as described above. otherwise normal stomach.  3. large bulbar ulcer without bleeding stigmata. otherwise D1 and D2 appeared normal.  . Colonoscopy, esophagogastroduodenoscopy (egd) and esophageal dilation  08/21/2012    Dr. Gala Romney: colonoscopy with internal hemorrhoids, friable anal canal, left-sided diverticulosis. EGD with question of eosinophilic esophagitis but NEGATIVE path. Dilation peformed with scope passage alone  . Breast biopsy Right 04/2012  . Mastectomy Right 06/08/12  . Tubal ligation  1989  . Femur im nail Right 01/21/2013  Procedure: INTRAMEDULLARY (IM) RETROGRADE FEMORAL NAILING;  Surgeon: Hessie Dibble, MD;  Location: [facility];  Service: Orthopedics;  Laterality: Right;  . Orif ankle fracture Left 09/15/2013    Procedure: OPEN REDUCTION INTERNAL FIXATION (ORIF) LEFTANKLE FRACTURE;  Surgeon: Sanjuana Kava, MD;  Location: [facility];  Service: Orthopedics;  Laterality: Left;  . Esophagogastroduodenoscopy N/A 11/01/2013    RMR: Narrowed, somewhat "ringed" appearing esophagus with marked fragility as described above. Endoscopic findings suspicious for eosinophilic esophagitis (biopsies previously negative). No biopsies today because of pre-existing GI bleed and marked fragility of the esophagus.  Hiatal hernia    [place], [place], [place] Pager [phone] After Hours Pager

## 2013-11-24 NOTE — Progress Notes (Signed)
Dr Oliver Pila made aware of patients anuric status. No further orders received at this time.  Elizabeth Orr

## 2013-11-24 NOTE — Progress Notes (Signed)
eLink Physician-Brief Progress Note Patient Name: BEATA BEASON DOB: [birth year] MRN: [id number]  Date of Service  11/24/2013   HPI/Events of Note   ABG    Component Value Date/Time   PHART 7.063* 11/24/2013 0442   PCO2ART 81.2* 11/24/2013 0442   PO2ART 136.0* 11/24/2013 0442   HCO3 22.1 11/24/2013 0442   TCO2 24.6 11/24/2013 0442   ACIDBASEDEF 6.9* 11/24/2013 0442   O2SAT 97.4 11/24/2013 0442      eICU Interventions  Will increase RR from 16 to 22.  Change PEEP to 8 and decrease FiO2 to 60% >> adjust to keep SpO2 > 92%.  F/u ABG later this AM.    Intervention Category Major Interventions: Acid-Base disturbance - evaluation and management  Jeanett Antonopoulos 11/24/2013, 5:02 AM

## 2013-11-24 NOTE — Progress Notes (Signed)
PULMONARY / CRITICAL CARE MEDICINE   Name: Elizabeth Orr MRN: [id number] DOB: 03-22-1961    ADMISSION DATE:  11/27/2013 CONSULTATION DATE:  11/18/2013  REFERRING MD :  EDP PRIMARY SERVICE: PCCM  CHIEF COMPLAINT:  Abdominal pain  BRIEF PATIENT DESCRIPTION: 38 yon breast CA and alcoholic cirrhosis presented 4/7 with 2 day history of altered mental status. In ED was hypotensive 48/28 and GCS 4. Occult blood positive. Intubated in ED. PCCM asked to admit.   SIGNIFICANT EVENTS / STUDIES:  4/7  CT Head  >>> nad  LINES / TUBES: OETT 4/7 >>> OGT 4/7 >>> R IJ CVL 4/7 >>> L chest port >>>  CULTURES: 4/7  Blood  >>> 4/7  Urine  >>> 4/7  Sputum  >>>  ANTIBIOTICS: Zosyn 4/7 >>> Vancomycin 4/7 >>>  INTERVAL HISTORY:  Remains unresponsive / off sedation. No overt blood via OGT.  VITAL SIGNS: Temp:  [97.3 F (36.3 C)-98.6 F (37 C)] 98.1 F (36.7 C) (04/08 1200) Pulse Rate:  [25-149] 25 (04/08 1145) Resp:  [8-28] 24 (04/08 1200) BP: (62-153)/(23-99) 88/48 mmHg (04/08 1200) SpO2:  [88 %-100 %] 100 % (04/08 1200) FiO2 (%):  [60 %-100 %] 80 % (04/08 1200) Weight:  [80.6 kg (177 lb 11.1 oz)-81 kg (178 lb 9.2 oz)] 80.6 kg (177 lb 11.1 oz) (04/08 0300)  HEMODYNAMICS:   VENTILATOR SETTINGS: Vent Mode:  [-] PRVC FiO2 (%):  [60 %-100 %] 80 % Set Rate:  [16 bmp-24 bmp] 24 bmp Vt Set:  [350 mL-500 mL] 450 mL PEEP:  [5 cmH20-10 cmH20] 10 cmH20 Plateau Pressure:  [23 cmH20-30 cmH20] 28 cmH20  INTAKE / OUTPUT: Intake/Output     04/07 0701 - 04/08 0700 04/08 0701 - 04/09 0700   I.V. (mL/kg) 1224.9 (15.2) 1990.3 (24.7)   IV Piggyback 800    Total Intake(mL/kg) 2024.9 (25.1) 1990.3 (24.7)   Urine (mL/kg/hr)  15 (0)   Total Output   15   Net +2024.9 +1975.3        Stool Occurrence  1 x     PHYSICAL EXAMINATION: General:  Ventilated synchronous Neuro:  GCS 3, no gag / cough HEENT:  NO JVD Cardiovascular:  RRR, no murmurs Lungs:  Bilateral air entry, few rhonchi Abdomen:  Soft,  non-distended Musculoskeletal:  No edema Skin:  Pressure ulcers, 2+ pitting peripheral edema   LABS:  CBC  Recent Labs Lab 12/15/2013 2134 11/24/13 0630 11/24/13 1113  WBC 15.6*  --   --   HGB 12.2 11.5* 11.3*  HCT 36.3 36.2 34.4*  PLT 233  --   --    Coag's  Recent Labs Lab 11/22/2013 2134  INR 2.27*   BMET  Recent Labs Lab 11/24/2013 2134  NA 137  K 3.4*  CL 94*  CO2 22  BUN 30*  CREATININE 3.76*  GLUCOSE 95   Electrolytes  Recent Labs Lab 12/02/2013 2134  CALCIUM 8.4   Sepsis Markers  Recent Labs Lab 11/26/2013 2114  LATICACIDVEN 4.59*   ABG  Recent Labs Lab 12/11/2013 2213 11/24/13 0442 11/24/13 0906  PHART 7.371 7.063* 7.193*  PCO2ART 41.6 81.2* 55.1*  PO2ART 139.0* 136.0* 64.0*   Liver Enzymes  Recent Labs Lab 12/07/2013 2134  AST 70*  ALT 15  ALKPHOS 221*  BILITOT 6.6*  ALBUMIN 1.2*   Cardiac Enzymes No results found for this basename: TROPONINI, PROBNP,  in the last 168 hours Glucose No results found for this basename: GLUCAP,  in the last 168 hours  IMAGING:  Ct Head Wo Contrast  Dec 09, 2013   CLINICAL DATA:  Altered mental status.  EXAM: CT HEAD WITHOUT CONTRAST  TECHNIQUE: Contiguous axial images were obtained from the base of the skull through the vertex without intravenous contrast.  COMPARISON:  CT scan dated 10/07/2013 and brain MRI dated 10/08/2013  FINDINGS: No mass lesion. No midline shift. No acute hemorrhage or hematoma. No extra-axial fluid collections. No evidence of acute infarction. There are a few scattered periventricular white matter lucencies consistent with chronic small vessel ischemic disease. Ventricles are not dilated.  No acute osseous abnormality.  The patient does have new extensive secretions in the posterior nasopharynx with new fluid in the ethmoid and sphenoid sinuses.  IMPRESSION: No acute abnormality of the brain. Extensive secretions in the posterior nasopharynx extending into the paranasal sinuses.    Electronically Signed   By: Rozetta Nunnery M.D.   On: 12-09-13 22:04   Dg Chest Port 1 View  11/24/2013   CLINICAL DATA:  Endotracheal tube placement. Central line placement.  EXAM: PORTABLE CHEST - 1 VIEW  COMPARISON:  Chest radiograph performed 2013-12-09  FINDINGS: The patient's endotracheal tube is seen ending 2 cm above the carina. An enteric tube is noted extending below the diaphragm. A right IJ line is noted ending about the distal SVC. A left-sided chest port is seen ending about the mid to distal SVC.  Persistent left basilar airspace opacification may reflect atelectasis or pneumonia. A small left pleural effusion is seen. No pneumothorax is identified.  The cardiomediastinal silhouette is borderline normal in size. No acute osseous abnormalities are seen. Scattered clips are noted overlying the right axilla.  IMPRESSION: 1. Endotracheal tube seen ending 2 cm above the carina. 2. Right IJ line noted ending about the distal SVC. 3. Persistent left basilar airspace opacification may reflect atelectasis or pneumonia; small left pleural effusion again seen.   Electronically Signed   By: Garald Balding M.D.   On: 11/24/2013 01:02   Dg Chest Portable 1 View  2013-12-09   CLINICAL DATA:  Altered mental status.  EXAM: PORTABLE CHEST - 1 VIEW  COMPARISON:  10/31/2013  FINDINGS: Patient is slightly rotated to the left. Nasogastric tube courses through the region of the stomach and off the inferior portion of the film. A left subclavian Port-A-Cath is unchanged with tip overlying the region of the SVC. Endotracheal tube has tip 3 cm above the carina. Lungs are adequately inflated with subtle hazy density over the left base which may be due to combination of small amount of left pleural fluid with atelectasis, although cannot exclude infection. Cardiomediastinal silhouette and remainder the exam is unchanged.  IMPRESSION: Minimal hazy density left base which may be due to small amount of pleural fluid/  atelectasis although cannot exclude infection.   Electronically Signed   By: Marin Olp M.D.   On: Dec 09, 2013 22:44   ASSESSMENT / PLAN:  PULMONARY A: Acute respiratory failure in setting of acute encephalopathy Persistent resopiratory acidosis Possible aspiration pneumonia P:   Goal pH>7.30, SpO2>92 Continuous mechanical support Increase f 30, ABG in 1 hour VAP bundle Hold SBT as no respiratory effort Trend ABG/CXR Albuterol PRN  CARDIOVASCULAR A:  Shock, etiology is not clear, doubt hemorrhagic with stable serial Hb P:  Goal MAP > 65 Levophed gtt D/c Dopamine Trend lactate / troponin TTE  RENAL A:   Acute renal failure Hypokalemia  P:   Goal CVP 10-12 Trend BMP, recheck now Start CVP q4h NS 1000 x 1 K supplementation  after BMP results available D/c maintenance fluids, bolus PRN for goal CVP  GASTROINTESTINAL A:   Alcoholic cirrhosis Doubt significant GI hemorrhage ( 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 and no ulcers / varices )  Nutrition  P:   D/c Octreotide gtt D/c Protonix gtt Protonix bid TF per Nutritionist  HEMATOLOGIC A:  Coagulopathy in setting of cirrhosis Leukocytosis P:  Trend CBC / INR Folate  INFECTIOUS A:   Aspiration pneumonia P:   Abx / cx as above  ENDOCRINE A:   At riskr hypoglycemia Unknown adrenal function P: Cortisol level CBG q4h  NEUROLOGIC A:   Acute encephalopathy P:   D/c Fentanyl / Versed Lactulose, change to via NGT Add Rifaximin Thiamin Watch for signs of withdrawal  I have personally obtained history, examined patient, evaluated and interpreted laboratory and imaging results, reviewed medical records, formulated assessment / plan and placed orders.  CRITICAL CARE:  The patient is critically ill with multiple organ systems failure and requires high complexity decision making for assessment and support, frequent evaluation and titration of therapies, application of advanced monitoring  technologies and extensive interpretation of multiple databases. Critical Care Time devoted to patient care services described in this note is 45 minutes.   Doree Fudge, MD Pulmonary and [place] Pager: [phone]  11/24/2013, 12:36 PM

## 2013-11-24 NOTE — Procedures (Signed)
Central Venous Catheter Insertion Procedure Note ADAM DEMARY [id number] [birth year]  Procedure: Insertion of Central Venous Catheter Indications: Assessment of intravascular volume, Drug and/or fluid administration and Frequent blood sampling  Procedure Details Consent: Unable to obtain consent because of emergent medical necessity. Time Out: Verified patient identification, verified procedure, site/side was marked, verified correct patient position, special equipment/implants available, medications/allergies/relevent history reviewed, required imaging and test results available.  Performed  Maximum sterile technique was used including antiseptics, cap, gloves, gown, hand hygiene, mask and sheet. Skin prep: Chlorhexidine; local anesthetic administered A antimicrobial bonded/coated triple lumen catheter was placed in the right internal jugular vein using the Seldinger technique. Ultrasound guidance used.yes Catheter placed to 16 cm. Blood aspirated via all 3 ports and then flushed x 3. Line sutured x 2 and dressing applied.  Evaluation Blood flow good Complications: No apparent complications Patient did tolerate procedure well. Chest X-ray ordered to verify placement.  CXR: pending.  Georgann Housekeeper, ACNP [place] [facility]/Critical Care Pager [phone] or [phone]  U/S used in placement. I was present and supervised procedure.  Rush Farmer, M.D. [facility] Pulmonary/Critical Care Medicine. Pager: [phone]. After hours pager: [phone].

## 2013-11-24 NOTE — Progress Notes (Signed)
UR completed.  Camila Norville, RN BSN [facility] CCM Trauma/Neuro ICU Case Manager [phone]  

## 2013-11-24 NOTE — ED Provider Notes (Signed)
I saw and evaluated the patient, reviewed the resident's note and I agree with the findings and plan.   EKG Interpretation None       CRITICAL CARE Performed by: Osvaldo Shipper   Total critical care time: 45 minutes  Critical care time was exclusive of separately billable procedures and treating other patients.  Critical care was necessary to treat or prevent imminent or life-threatening deterioration.  Critical care was time spent personally by me on the following activities: development of treatment plan with patient and/or surrogate as well as nursing, discussions with consultants, evaluation of patient's response to treatment, examination of patient, obtaining history from patient or surrogate, ordering and performing treatments and interventions, ordering and review of laboratory studies, ordering and review of radiographic studies, pulse oximetry and re-evaluation of patient's condition.   Patient here with altered mental status, hypotension. Slowly progressive AMS for past 2 days per EMS. GCS 6 on arrival, responds to pain, nonverbal, hypoxic, hypotensive. Port in L chest accessed and intubated for airway protection. Coffee ground emesis after RSI meds, intubated with glidescope. Levophed initiated through port for concerns of hypotension, sepsis. Rocephin started with GI bleed concerns. With hx of cirrhosis, octreotide and PPI started.  Broad spectrum antibiotics initiated.  Labs show end organ function with elevated lactate, transaminitis, ARF. Dr. Benson Norway with GI will f/u tomorrow.  Admitted to Critical Care.  1. Acute respiratory failure with hypoxia   2. Altered mental status   3. Acute renal failure   4. UTI (urinary tract infection)   5. Septic shock   6. Acute respiratory failure   7. Acute encephalopathy   8. Alcoholic cirrhosis of liver   9. Aspiration pneumonia      Osvaldo Shipper, MD 11/24/13 [phone]

## 2013-11-25 ENCOUNTER — Inpatient Hospital Stay (HOSPITAL_COMMUNITY): Payer: Medicaid Other

## 2013-11-25 ENCOUNTER — Encounter (HOSPITAL_COMMUNITY): Admission: EM | Disposition: E | Payer: Self-pay | Source: Home / Self Care | Attending: Pulmonary Disease

## 2013-11-25 DIAGNOSIS — R6520 Severe sepsis without septic shock: Secondary | ICD-10-CM

## 2013-11-25 DIAGNOSIS — A419 Sepsis, unspecified organism: Principal | ICD-10-CM

## 2013-11-25 DIAGNOSIS — R6521 Severe sepsis with septic shock: Secondary | ICD-10-CM

## 2013-11-25 LAB — POCT I-STAT 3, ART BLOOD GAS (G3+)
Acid-base deficit: 7 mmol/L — ABNORMAL HIGH (ref 0.0–2.0)
Bicarbonate: 18.4 mEq/L — ABNORMAL LOW (ref 20.0–24.0)
O2 Saturation: 94 %
PCO2 ART: 35.8 mmHg (ref 35.0–45.0)
PH ART: 7.319 — AB (ref 7.350–7.450)
PO2 ART: 78 mmHg — AB (ref 80.0–100.0)
TCO2: 19 mmol/L (ref 0–100)

## 2013-11-25 LAB — BLOOD GAS, ARTERIAL
ACID-BASE DEFICIT: 15.2 mmol/L — AB (ref 0.0–2.0)
Bicarbonate: 12.2 mEq/L — ABNORMAL LOW (ref 20.0–24.0)
Drawn by: 40415
FIO2: 0.5 %
LHR: 30 {breaths}/min
O2 SAT: 91.7 %
PEEP: 10 cmH2O
PO2 ART: 84 mmHg (ref 80.0–100.0)
Patient temperature: 98.6
TCO2: 13.3 mmol/L (ref 0–100)
VT: 450 mL
pCO2 arterial: 38.1 mmHg (ref 35.0–45.0)
pH, Arterial: 7.131 — CL (ref 7.350–7.450)

## 2013-11-25 LAB — BASIC METABOLIC PANEL
BUN: 31 mg/dL — ABNORMAL HIGH (ref 6–23)
CHLORIDE: 99 meq/L (ref 96–112)
CO2: 11 meq/L — AB (ref 19–32)
CREATININE: 3.95 mg/dL — AB (ref 0.50–1.10)
Calcium: 8.2 mg/dL — ABNORMAL LOW (ref 8.4–10.5)
GFR calc Af Amer: 14 mL/min — ABNORMAL LOW (ref >90)
GFR calc non Af Amer: 12 mL/min — ABNORMAL LOW (ref >90)
GLUCOSE: 139 mg/dL — AB (ref 70–99)
Potassium: 3.5 mEq/L — ABNORMAL LOW (ref 3.7–5.3)
Sodium: 139 mEq/L (ref 137–147)

## 2013-11-25 LAB — CBC
HEMATOCRIT: 38.7 % (ref 36.0–46.0)
Hemoglobin: 12.9 g/dL (ref 12.0–15.0)
MCH: 35.9 pg — ABNORMAL HIGH (ref 26.0–34.0)
MCHC: 33.3 g/dL (ref 30.0–36.0)
MCV: 107.8 fL — ABNORMAL HIGH (ref 78.0–100.0)
Platelets: 274 10*3/uL (ref 150–400)
RBC: 3.59 MIL/uL — ABNORMAL LOW (ref 3.87–5.11)
RDW: 15.2 % (ref 11.5–15.5)
WBC: 34.1 10*3/uL — AB (ref 4.0–10.5)

## 2013-11-25 LAB — GLUCOSE, CAPILLARY
GLUCOSE-CAPILLARY: 138 mg/dL — AB (ref 70–99)
Glucose-Capillary: 102 mg/dL — ABNORMAL HIGH (ref 70–99)
Glucose-Capillary: 118 mg/dL — ABNORMAL HIGH (ref 70–99)
Glucose-Capillary: 127 mg/dL — ABNORMAL HIGH (ref 70–99)
Glucose-Capillary: 132 mg/dL — ABNORMAL HIGH (ref 70–99)
Glucose-Capillary: 159 mg/dL — ABNORMAL HIGH (ref 70–99)

## 2013-11-25 LAB — PHOSPHORUS: Phosphorus: 7 mg/dL — ABNORMAL HIGH (ref 2.3–4.6)

## 2013-11-25 LAB — MAGNESIUM: Magnesium: 1.9 mg/dL (ref 1.5–2.5)

## 2013-11-25 LAB — HEMOGLOBIN AND HEMATOCRIT, BLOOD
HCT: 35.2 % — ABNORMAL LOW (ref 36.0–46.0)
HCT: 32.9 % — ABNORMAL LOW (ref 36.0–46.0)
HEMOGLOBIN: 11.4 g/dL — AB (ref 12.0–15.0)
Hemoglobin: 10.9 g/dL — ABNORMAL LOW (ref 12.0–15.0)

## 2013-11-25 LAB — PROCALCITONIN: Procalcitonin: 9.72 ng/mL

## 2013-11-25 LAB — PROTIME-INR
INR: 1.79 — AB (ref 0.00–1.49)
Prothrombin Time: 20.3 seconds — ABNORMAL HIGH (ref 11.6–15.2)

## 2013-11-25 LAB — LACTIC ACID, PLASMA: Lactic Acid, Venous: 7.1 mmol/L — ABNORMAL HIGH (ref 0.5–2.2)

## 2013-11-25 LAB — TROPONIN I: Troponin I: <0.3 ng/mL (ref <0.30)

## 2013-11-25 SURGERY — EGD (ESOPHAGOGASTRODUODENOSCOPY)
Anesthesia: Moderate Sedation

## 2013-11-25 MED ORDER — SODIUM BICARBONATE 8.4 % IV SOLN
INTRAVENOUS | Status: DC
  Administered 2013-11-25 – 2013-11-26 (×3): via INTRAVENOUS
  Filled 2013-11-25 (×6): qty 150

## 2013-11-25 MED ORDER — VANCOMYCIN HCL IN DEXTROSE 1-5 GM/200ML-% IV SOLN
1000.0000 mg | INTRAVENOUS | Status: DC
  Administered 2013-11-25 – 2013-11-27 (×2): 1000 mg via INTRAVENOUS
  Filled 2013-11-25 (×3): qty 200

## 2013-11-25 MED ORDER — IOHEXOL 300 MG/ML  SOLN
25.0000 mL | INTRAMUSCULAR | Status: AC
  Administered 2013-11-25 (×2): 25 mL via ORAL

## 2013-11-25 MED ORDER — VASOPRESSIN 20 UNIT/ML IJ SOLN
0.0300 [IU]/min | INTRAVENOUS | Status: DC
  Administered 2013-11-25 – 2013-11-27 (×3): 0.03 [IU]/min via INTRAVENOUS
  Filled 2013-11-25 (×3): qty 2.5

## 2013-11-25 MED FILL — Norepinephrine Bitartrate IV Soln 1 MG/ML (Base Equivalent): INTRAMUSCULAR | Qty: 2 | Status: AC

## 2013-11-25 MED FILL — Dextrose Inj 5%: INTRAVENOUS | Qty: 250 | Status: AC

## 2013-11-25 NOTE — Progress Notes (Signed)
Chaplain responded to consult from nurse concerning pt in dying process. When chaplain arrived, husband was alone with pt. 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 He stated he does not attend church but knows "the good Lord," even though he is angry with Him right now. Chaplain helped him process these feelings and provided emotional support. Husband seems well supported by his adult children. Will follow up as necessary.

## 2013-11-25 NOTE — Progress Notes (Signed)
MD notified of persistent hypotension and increasing heart rate. Order to add vasopressin and draw stat ABG. Will carry out orders and continue to monitor.

## 2013-11-25 NOTE — Progress Notes (Signed)
ANTIBIOTIC CONSULT NOTE - FOLLOW UP  Pharmacy Consult for Vancomycin Indication: rule out pneumonia  Allergies  Allergen Reactions  . Citalopram     Makes her feel drowsy/sleepy.  . Effexor [Venlafaxine Hcl]     Makes pt feel very bad,will not take again.  . Motrin [Ibuprofen] Swelling    Lips swell  . Trazodone And Nefazodone     Makes pt feel very bad and "hung over"    Patient Measurements: Height: 5\' 1"  (154.9 cm) Weight: 201 lb 8 oz (91.4 kg) IBW/kg (Calculated) : 47.8 Vital Signs: Temp: 100 F (37.8 C) (04/09 1000) Temp src: Core (Comment) (04/09 1000) BP: 118/89 mmHg (04/09 1000) Pulse Rate: 124 (04/09 1000) Intake/Output from previous day: 04/08 0701 - 04/09 0700 In: 3830.7 [I.V.:3385.7; NG/GT:295; IV Piggyback:150] Out: 27 [Urine:27] Intake/Output from this shift: Total I/O In: 1412 [P.O.:450; I.V.:459.5; NG/GT:502.5] Out: -   Labs:  Recent Labs  12/05/2013 2134  11/24/13 1225 11/24/13 1700 11/24/13 2330 Nov 30, 2013 0500  WBC 15.6*  --   --   --   --  34.1*  HGB 12.2  < >  --  12.0 12.4 12.9  PLT 233  --   --   --   --  274  CREATININE 3.76*  --  3.59*  --   --  3.95*  < > = values in this interval not displayed. Estimated Creatinine Clearance: 17.3 ml/min (by C-G formula based on Cr of 3.95). No results found for this basename: VANCOTROUGH, Corlis Leak, VANCORANDOM, [place], GENTPEAK, GENTRANDOM, TOBRATROUGH, TOBRAPEAK, TOBRARND, AMIKACINPEAK, AMIKACINTROU, AMIKACIN,  in the last 72 hours   Microbiology: Recent Results (from the past 720 hour(s))  URINE CULTURE     Status: None   Collection Time    10/31/13  1:40 PM      Result Value Ref Range Status   Specimen Description URINE, CLEAN CATCH   Final   Special Requests NONE   Final   Culture  Setup Time     Final   Value: 10/31/2013 21:17     Performed at [place]     Final   Value: 70,000 COLONIES/ML     Performed at [facility]   Culture     Final   Value:  YEAST     Performed at [facility]   Report Status 11/02/2013 FINAL   Final  CULTURE, BLOOD (ROUTINE X 2)     Status: None   Collection Time    11/24/13 12:32 AM      Result Value Ref Range Status   Specimen Description BLOOD PICC LINE   Final   Special Requests BOTTLES DRAWN AEROBIC AND ANAEROBIC 5CC   Final   Culture  Setup Time     Final   Value: 11/24/2013 08:19     Performed at [facility]   Culture     Final   Value:        BLOOD CULTURE RECEIVED NO GROWTH TO DATE CULTURE WILL BE HELD FOR 5 DAYS BEFORE ISSUING A FINAL NEGATIVE REPORT     Performed at [facility]   Report Status PENDING   Incomplete  CULTURE, BLOOD (ROUTINE X 2)     Status: None   Collection Time    11/24/13  2:58 AM      Result Value Ref Range Status   Specimen Description BLOOD LEFT HAND   Final   Special Requests BOTTLES DRAWN AEROBIC ONLY [place]  Final   Culture  Setup Time     Final   Value: 11/24/2013 10:57     Performed at [facility]   Culture     Final   Value:        BLOOD CULTURE RECEIVED NO GROWTH TO DATE CULTURE WILL BE HELD FOR 5 DAYS BEFORE ISSUING A FINAL NEGATIVE REPORT     Performed at [facility]   Report Status PENDING   Incomplete  MRSA PCR SCREENING     Status: None   Collection Time    11/24/13  3:15 AM      Result Value Ref Range Status   MRSA by PCR NEGATIVE  NEGATIVE Final   Comment:            The GeneXpert MRSA Assay (FDA     approved for NASAL specimens     only), is one component of a     comprehensive MRSA colonization     surveillance program. It is not     intended to diagnose MRSA     infection nor to guide or     monitor treatment for     MRSA infections.    Anti-infectives   Start     Dose/Rate Route Frequency Ordered Stop   11/26/13 0600  vancomycin (VANCOCIN) IVPB 1000 mg/200 mL premix  Status:  Discontinued     1,000 mg 200 mL/hr over 60 Minutes Intravenous every 72 hours 12/08/2013 2342 11/17/2013 1013   12/15/2013  1100  vancomycin (VANCOCIN) IVPB 1000 mg/200 mL premix     1,000 mg 200 mL/hr over 60 Minutes Intravenous Every 48 hours 12/11/2013 1013     11/24/13 1400  rifaximin (XIFAXAN) tablet 550 mg     550 mg Oral Every 12 hours 11/24/13 1230     11/24/13 0600  piperacillin-tazobactam (ZOSYN) IVPB 2.25 g     2.25 g 100 mL/hr over 30 Minutes Intravenous 3 times per day 12/02/2013 2342     12/03/2013 2215  vancomycin (VANCOCIN) 1,500 mg in sodium chloride 0.9 % 500 mL IVPB     1,500 mg 250 mL/hr over 120 Minutes Intravenous  Once 11/24/2013 2214 11/24/13 0655   12/02/2013 2215  piperacillin-tazobactam (ZOSYN) IVPB 3.375 g     3.375 g 100 mL/hr over 30 Minutes Intravenous  Once 12/03/2013 2214 11/24/13 0356   12/07/2013 2145  cefTRIAXone (ROCEPHIN) 1 g in dextrose 5 % 50 mL IVPB     1 g 100 mL/hr over 30 Minutes Intravenous  Once 12/01/2013 2135 11/24/13 0357      53 yo F admitted 11/21/2013  Abdominal pain + AMS. Pharmacy consulted to dose vanc and zosyn. PMH: breast CA, alcoholic cirrhosis,   Events: shock, anuric, NaBicarb, worsening, aggressive support but no CPR  Infectious Disease: r/o HCAP/asp PNA.  LA 7.1, trend up. CXR(4/8): atelectasis vs. PNA.  Tm 100.2, WBC 34.1, trend up   Vanc 4/7 >>  Zosyn 4/7 >>  4/8 UCx >> 4/8 BCx >>  Cardiovascular:  hemorrhagic shock   NE at 15 mcg/min. Vasopressin SBP 90-120s, HR 30s  Nephrology: ARI (SCr, baseline ~0.5) CrCl 17 ml/min anuric. On FA, thiamine.  Goal of Therapy Vancomycin trough 15-20 mcg/ml  Plan - Loaded with Vancomycin on 4/7 a.m -- given rising fever and WBC, renal function worsening but currently CrCl approprate for q48h dosing will give 1g vancomycin today - Cont Zosyn 2.25g/8h - appropriate for now   Thank you for allowing pharmacy to be a  part of this patients care team.  Rowe Robert Pharm.D., BCPS Clinical Pharmacist 12/08/2013 10:16 AM Pager: [phone] Phone: [phone]

## 2013-11-25 NOTE — Progress Notes (Signed)
PULMONARY / CRITICAL CARE MEDICINE   Name: Elizabeth Orr MRN: [id number] DOB: Mar 08, 1961    ADMISSION DATE:  11/20/2013 CONSULTATION DATE:  12/14/2013  REFERRING MD :  EDP PRIMARY SERVICE: PCCM  CHIEF COMPLAINT:  Abdominal pain  BRIEF PATIENT DESCRIPTION: 53 yo w hx breast CA and alcoholic cirrhosis, presented 4/7 with 2 day history of altered mental status. In ED was hypotensive 48/28 and GCS 4. Occult blood positive. Intubated in ED. PCCM asked to admit.   SIGNIFICANT EVENTS / STUDIES:  CT Head  4/7 >>> no acute findings TTE 4/8 >> normal LV size and fxn, normal RV, normal RVSP  LINES / TUBES: OETT 4/7 >>> OGT 4/7 >>> R IJ CVL 4/7 >>> L chest port >>>  CULTURES: 4/7  Blood  >>> 4/7  Urine  >>> 4/7  Sputum  >>>  ANTIBIOTICS: Zosyn 4/7 >>> Vancomycin 4/7 >>>  INTERVAL HISTORY:   Progressive shock last 24 h > norepi titrated up and vasopressin added Anuria last 24h Na Bicarb added 4/9 early am TF started 4/8 but high residuals so decreased am 4/9  VITAL SIGNS: Temp:  [97.3 F (36.3 C)-100.2 F (37.9 C)] 100.2 F (37.9 C) (04/09 0800) Pulse Rate:  [25-132] 30 (04/09 0716) Resp:  [8-32] 30 (04/09 0800) BP: (57-136)/(17-96) 129/69 mmHg (04/09 0800) SpO2:  [88 %-100 %] 96 % (04/09 0716) FiO2 (%):  [50 %-80 %] 50 % (04/09 0800) Weight:  [91.4 kg (201 lb 8 oz)] 91.4 kg (201 lb 8 oz) (04/09 0500)  HEMODYNAMICS: CVP:  [9 mmHg-24 mmHg] 9 mmHg VENTILATOR SETTINGS: Vent Mode:  [-] PRVC FiO2 (%):  [50 %-80 %] 50 % Set Rate:  [24 bmp-30 bmp] 30 bmp Vt Set:  [450 mL] 450 mL PEEP:  [10 cmH20] 10 cmH20 Plateau Pressure:  [24 cmH20-30 cmH20] 24 cmH20  INTAKE / OUTPUT: Intake/Output     04/08 0701 - 04/09 0700 04/09 0701 - 04/10 0700   I.V. (mL/kg) 3285.7 (35.9) 45 (0.5)   NG/GT 295 15   IV Piggyback 100    Total Intake(mL/kg) 3680.7 (40.3) 60 (0.7)   Urine (mL/kg/hr) 27 (0)    Total Output 27     Net +3653.7 +60        Stool Occurrence 1 x      PHYSICAL  EXAMINATION: General:  Ventilated synchronous Neuro:  GCS 3, no gag / cough HEENT:  NO JVD Cardiovascular:  RRR, no murmurs Lungs:  Bilateral air entry, few rhonchi Abdomen:  Soft, non-distended Musculoskeletal:  No edema Skin:  Pressure ulcers, 2+ pitting peripheral edema   LABS:  CBC  Recent Labs Lab 11/21/2013 2134  11/24/13 1700 11/24/13 2330 12/25/13 0500  WBC 15.6*  --   --   --  34.1*  HGB 12.2  < > 12.0 12.4 12.9  HCT 36.3  < > 36.9 38.0 38.7  PLT 233  --   --   --  274  < > = values in this interval not displayed. Coag's  Recent Labs Lab 11/27/2013 2134 2013-12-25 0500  INR 2.27* 1.79*   BMET  Recent Labs Lab 12/08/2013 2134 11/24/13 1225 2013/12/25 0500  NA 137 136* 139  K 3.4* 3.3* 3.5*  CL 94* 97 99  CO2 22 17* 11*  BUN 30* 28* 31*  CREATININE 3.76* 3.59* 3.95*  GLUCOSE 95 132* 139*   Electrolytes  Recent Labs Lab 12/02/2013 2134 11/24/13 1225 12/25/2013 0500  CALCIUM 8.4 7.5* 8.2*  MG  --   --  1.9  PHOS  --   --  7.0*   Sepsis Markers  Recent Labs Lab 11/21/2013 2114 11/24/13 1223 11/24/13 1823  LATICACIDVEN 4.59* 4.8* 5.6*   ABG  Recent Labs Lab 11/24/13 0906 11/24/13 1632 12/08/2013 0317  PHART 7.193* 7.274* 7.131*  PCO2ART 55.1* 36.3 38.1  PO2ART 64.0* 54.0* 84.0   Liver Enzymes  Recent Labs Lab 12/14/2013 2134  AST 70*  ALT 15  ALKPHOS 221*  BILITOT 6.6*  ALBUMIN 1.2*   Cardiac Enzymes  Recent Labs Lab 11/24/13 1224 11/24/13 1824  TROPONINI <0.30 <0.30   Glucose  Recent Labs Lab 11/24/13 1610 11/24/13 2006 12/06/2013 0009  GLUCAP 132* 97 102*    IMAGING:   Ct Head Wo Contrast  11/24/2013   CLINICAL DATA:  Altered mental status.  EXAM: CT HEAD WITHOUT CONTRAST  TECHNIQUE: Contiguous axial images were obtained from the base of the skull through the vertex without intravenous contrast.  COMPARISON:  CT scan dated 10/07/2013 and brain MRI dated 10/08/2013  FINDINGS: No mass lesion. No midline shift. No acute  hemorrhage or hematoma. No extra-axial fluid collections. No evidence of acute infarction. There are a few scattered periventricular white matter lucencies consistent with chronic small vessel ischemic disease. Ventricles are not dilated.  No acute osseous abnormality.  The patient does have new extensive secretions in the posterior nasopharynx with new fluid in the ethmoid and sphenoid sinuses.  IMPRESSION: No acute abnormality of the brain. Extensive secretions in the posterior nasopharynx extending into the paranasal sinuses.   Electronically Signed   By: Rozetta Nunnery M.D.   On: 11/22/2013 22:04   Dg Chest Port 1 View  11/24/2013   CLINICAL DATA:  Endotracheal tube placement. Central line placement.  EXAM: PORTABLE CHEST - 1 VIEW  COMPARISON:  Chest radiograph performed 12/01/2013  FINDINGS: The patient's endotracheal tube is seen ending 2 cm above the carina. An enteric tube is noted extending below the diaphragm. A right IJ line is noted ending about the distal SVC. A left-sided chest port is seen ending about the mid to distal SVC.  Persistent left basilar airspace opacification may reflect atelectasis or pneumonia. A small left pleural effusion is seen. No pneumothorax is identified.  The cardiomediastinal silhouette is borderline normal in size. No acute osseous abnormalities are seen. Scattered clips are noted overlying the right axilla.  IMPRESSION: 1. Endotracheal tube seen ending 2 cm above the carina. 2. Right IJ line noted ending about the distal SVC. 3. Persistent left basilar airspace opacification may reflect atelectasis or pneumonia; small left pleural effusion again seen.   Electronically Signed   By: Garald Balding M.D.   On: 11/24/2013 01:02   Dg Chest Portable 1 View  11/18/2013   CLINICAL DATA:  Altered mental status.  EXAM: PORTABLE CHEST - 1 VIEW  COMPARISON:  10/31/2013  FINDINGS: Patient is slightly rotated to the left. Nasogastric tube courses through the region of the stomach and off  the inferior portion of the film. A left subclavian Port-A-Cath is unchanged with tip overlying the region of the SVC. Endotracheal tube has tip 3 cm above the carina. Lungs are adequately inflated with subtle hazy density over the left base which may be due to combination of small amount of left pleural fluid with atelectasis, although cannot exclude infection. Cardiomediastinal silhouette and remainder the exam is unchanged.  IMPRESSION: Minimal hazy density left base which may be due to small amount of pleural fluid/ atelectasis although cannot exclude infection.   Electronically  Signed   By: Marin Olp M.D.   On: 11/30/2013 22:44   ASSESSMENT / PLAN:  PULMONARY A: Acute respiratory failure in setting of acute encephalopathy Respiratory acidosis, improved; metabolic acidosis worsening Possible aspiration pneumonia L pleural effusion P:   Goal pH>7.30, SpO2>92 VAP bundle Daily SBT > no resp effort to date on no sedation Trend ABG/CXR Albuterol PRN Liberalize Vt on 4/9 as no evidence for ALI Consider L thora. Suspect this is due to hepatic disease  CARDIOVASCULAR A:  Shock, etiology is not clear, doubt hemorrhagic with stable serial Hb. TTE shows intact LV fxn. Suspect due to sepsis + hepatic disease.  P:  Goal MAP > 65 Volume based on CVP > goal 10-12 (currently 9) Levophed gtt + vasopressin  RENAL A:   Acute anuric renal failure; ATN for shock state, consider possible hepatorenal syndrome Hypokalemia  Severe AG metabolic acidosis P:   Goal CVP 10-12 Na bicarb started 4/9 am Follow BMP, ABG Lactate now and in am  GASTROINTESTINAL A:   Alcoholic cirrhosis Doubt significant GI hemorrhage (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 and no ulcers / varices), Hgb stable Nutrition  P:   D/c'd Octreotide gtt D/c'd Protonix gtt Protonix bid TF per Nutritionist > poorly tolerated Rising lactate, distended abdomen > concerning for intra-abd catastrophe > CT scan abd and  pelvis  HEMATOLOGIC A:  Coagulopathy in setting of cirrhosis Leukocytosis P:  Trend CBC / INR Folate  INFECTIOUS A:   Source unclear, consider Possible L PNA but no secretions. More likely SBP or intra-abd catastrophe given her precipitous decline.  P:   Empiric broad abx Follow cx data CT scan abd and pelvis Consider paracentesis and possible L thoracentesis depending on Ct results  ENDOCRINE A:   At risk hypoglycemia Unknown adrenal function > intact, random cort 54 P: CBG q4h  NEUROLOGIC A:   Acute encephalopathy, suspect hepatic encephalopathy Hx EtOH abuse, last was 07/2013 P:   D/c'd Fentanyl / Versed Lactulose tid  Rifaximin Thiamine  GLOBAL Critically ill and declining, prognosis poor due to underlying liver disease. Discussed with husband and 2 daughters at bedside 4/9. They understand that she is worsening, that we are supporting fully. They want all aggressive measures that have a chance to reverse acute process but also understand that if she progresses to cardiac arrest that she has little chance for meaningful survival. We agreed that she should be limited code, no CPR or defib. All other measures are appropriate.   I have personally obtained history, examined patient, evaluated and interpreted laboratory and imaging results, reviewed medical records, formulated assessment / plan and placed orders.  CRITICAL CARE:  The patient is critically ill with multiple organ systems failure and requires high complexity decision making for assessment and support, frequent evaluation and titration of therapies, application of advanced monitoring technologies and extensive interpretation of multiple databases. Critical Care Time devoted to patient care services described in this note is 60 minutes.    Baltazar Apo, MD, PhD Dec 21, 2013, 8:56 AM [facility] [phone] or if no answer [phone]

## 2013-11-25 NOTE — Progress Notes (Signed)
MD made aware of ABG results. Bicarb-12.2. Order for bicarb drip. Will carry out order and continue to monitor.

## 2013-11-25 NOTE — Progress Notes (Signed)
Patient took down for CT abdomen and pt began vomiting while lying flat on the CT table. Immediately suctioned mouth, O2 Saturation remained stable. Vitals remained stable. Dr. Lamonte Sakai notified, OG tube to intermittent low suction now. Will continue to monitor.

## 2013-11-25 NOTE — Progress Notes (Signed)
Vent changes per MD order 

## 2013-11-25 NOTE — Procedures (Signed)
Arterial Catheter Insertion Procedure Note RAKHI ROMAGNOLI [id number] 1960/09/16  Procedure: Insertion of Arterial Catheter  Indications: Blood pressure monitoring and Frequent blood sampling  Procedure Details Consent: Unable to obtain consent because of emergent medical necessity. Time Out: Verified patient identification, verified procedure, site/side was marked, verified correct patient position, special equipment/implants available, medications/allergies/relevent history reviewed, required imaging and test results available.  Performed  Maximum sterile technique was used including antiseptics, cap, gloves, gown, hand hygiene, mask and sheet. Skin prep: Chlorhexidine; local anesthetic administered 20 gauge catheter was inserted into right radial artery using the Seldinger technique.  Evaluation Blood flow good; BP tracing good. Complications: No apparent complications.   Performed by Dr. Pennie Banter, M.D. [facility]/Critical Care Medicine. Pager: [phone]. After hours pager: [phone].

## 2013-11-25 NOTE — Progress Notes (Signed)
[place] [facility] Progress Note Patient Name: Elizabeth Orr DOB: 1960/11/11 MRN: [id number]  Date of Service  12/02/2013   HPI/Events of Note   Increasing levo Oliguric Lactate rising  eICU Interventions  worsening shock - add vaso   Intervention Category Major Interventions: Shock - evaluation and management  Rigoberto Noel 11/18/2013, 2:11 AM

## 2013-11-25 NOTE — Progress Notes (Signed)
[place] Progress Note Patient Name: Elizabeth Orr DOB: April 29, 1961 MRN: [id number]  Date of Service  2013-12-15   HPI/Events of Note   Severe metab acidosis  eICU Interventions  Bicarb gtt   Intervention Category Major Interventions: Acid-Base disturbance - evaluation and management  Rigoberto Noel 12/15/13, 3:27 AM

## 2013-11-26 ENCOUNTER — Inpatient Hospital Stay (HOSPITAL_COMMUNITY): Payer: Medicaid Other

## 2013-11-26 DIAGNOSIS — N390 Urinary tract infection, site not specified: Secondary | ICD-10-CM

## 2013-11-26 LAB — BLOOD GAS, ARTERIAL
ACID-BASE DEFICIT: 0.1 mmol/L (ref 0.0–2.0)
Bicarbonate: 23.6 mEq/L (ref 20.0–24.0)
Drawn by: 40415
FIO2: 0.5 %
MECHVT: 550 mL
O2 Saturation: 99 %
PEEP: 8 cmH2O
PO2 ART: 126 mmHg — AB (ref 80.0–100.0)
Patient temperature: 98.6
RATE: 14 resp/min
TCO2: 24.7 mmol/L (ref 0–100)
pCO2 arterial: 35.6 mmHg (ref 35.0–45.0)
pH, Arterial: 7.437 (ref 7.350–7.450)

## 2013-11-26 LAB — PROCALCITONIN: Procalcitonin: 11.33 ng/mL

## 2013-11-26 LAB — HEPATIC FUNCTION PANEL
ALT: 22 U/L (ref 0–35)
AST: 83 U/L — ABNORMAL HIGH (ref 0–37)
Albumin: 0.9 g/dL — ABNORMAL LOW (ref 3.5–5.2)
Alkaline Phosphatase: 175 U/L — ABNORMAL HIGH (ref 39–117)
BILIRUBIN DIRECT: 5.5 mg/dL — AB (ref 0.0–0.3)
BILIRUBIN TOTAL: 6.5 mg/dL — AB (ref 0.3–1.2)
Indirect Bilirubin: 1 mg/dL — ABNORMAL HIGH (ref 0.3–0.9)
Total Protein: 4.4 g/dL — ABNORMAL LOW (ref 6.0–8.3)

## 2013-11-26 LAB — URINE CULTURE

## 2013-11-26 LAB — BASIC METABOLIC PANEL
BUN: 35 mg/dL — ABNORMAL HIGH (ref 6–23)
BUN: 36 mg/dL — ABNORMAL HIGH (ref 6–23)
CALCIUM: 7.5 mg/dL — AB (ref 8.4–10.5)
CO2: 22 mEq/L (ref 19–32)
CO2: 18 meq/L — AB (ref 19–32)
CREATININE: 3.81 mg/dL — AB (ref 0.50–1.10)
Calcium: 7.9 mg/dL — ABNORMAL LOW (ref 8.4–10.5)
Chloride: 91 mEq/L — ABNORMAL LOW (ref 96–112)
Chloride: 92 mEq/L — ABNORMAL LOW (ref 96–112)
Creatinine, Ser: 3.8 mg/dL — ABNORMAL HIGH (ref 0.50–1.10)
GFR calc Af Amer: 15 mL/min — ABNORMAL LOW (ref >90)
GFR calc Af Amer: 15 mL/min — ABNORMAL LOW (ref >90)
GFR calc non Af Amer: 13 mL/min — ABNORMAL LOW (ref >90)
GFR, EST NON AFRICAN AMERICAN: 13 mL/min — AB (ref >90)
GLUCOSE: 85 mg/dL (ref 70–99)
Glucose, Bld: 134 mg/dL — ABNORMAL HIGH (ref 70–99)
Potassium: 2.7 mEq/L — CL (ref 3.7–5.3)
Potassium: 4 mEq/L (ref 3.7–5.3)
Sodium: 136 mEq/L — ABNORMAL LOW (ref 137–147)
Sodium: 136 mEq/L — ABNORMAL LOW (ref 137–147)

## 2013-11-26 LAB — CBC
HCT: 32.2 % — ABNORMAL LOW (ref 36.0–46.0)
HEMOGLOBIN: 11.1 g/dL — AB (ref 12.0–15.0)
MCH: 34.6 pg — ABNORMAL HIGH (ref 26.0–34.0)
MCHC: 34.5 g/dL (ref 30.0–36.0)
MCV: 100.3 fL — ABNORMAL HIGH (ref 78.0–100.0)
Platelets: 161 10*3/uL (ref 150–400)
RBC: 3.21 MIL/uL — ABNORMAL LOW (ref 3.87–5.11)
RDW: 15.1 % (ref 11.5–15.5)
WBC: 38.9 10*3/uL — AB (ref 4.0–10.5)

## 2013-11-26 LAB — CLOSTRIDIUM DIFFICILE BY PCR: CDIFFPCR: NEGATIVE

## 2013-11-26 LAB — GLUCOSE, CAPILLARY
GLUCOSE-CAPILLARY: 100 mg/dL — AB (ref 70–99)
GLUCOSE-CAPILLARY: 132 mg/dL — AB (ref 70–99)
Glucose-Capillary: 127 mg/dL — ABNORMAL HIGH (ref 70–99)
Glucose-Capillary: 121 mg/dL — ABNORMAL HIGH (ref 70–99)
Glucose-Capillary: 126 mg/dL — ABNORMAL HIGH (ref 70–99)
Glucose-Capillary: 67 mg/dL — ABNORMAL LOW (ref 70–99)
Glucose-Capillary: 78 mg/dL (ref 70–99)

## 2013-11-26 LAB — PROTIME-INR
INR: 2.1 — AB (ref 0.00–1.49)
PROTHROMBIN TIME: 22.9 s — AB (ref 11.6–15.2)

## 2013-11-26 LAB — PHOSPHORUS: Phosphorus: 4 mg/dL (ref 2.3–4.6)

## 2013-11-26 LAB — APTT: APTT: 39 s — AB (ref 24–37)

## 2013-11-26 LAB — MAGNESIUM: Magnesium: 1.7 mg/dL (ref 1.5–2.5)

## 2013-11-26 LAB — LACTIC ACID, PLASMA: Lactic Acid, Venous: 4.9 mmol/L — ABNORMAL HIGH (ref 0.5–2.2)

## 2013-11-26 MED ORDER — LACTULOSE 10 GM/15ML PO SOLN
30.0000 g | Freq: Four times a day (QID) | ORAL | Status: DC
  Administered 2013-11-26 – 2013-11-27 (×5): 30 g
  Filled 2013-11-26 (×7): qty 45

## 2013-11-26 MED ORDER — DEXTROSE 50 % IV SOLN
25.0000 mL | Freq: Once | INTRAVENOUS | Status: AC | PRN
  Administered 2013-11-26: 25 mL via INTRAVENOUS

## 2013-11-26 MED ORDER — DEXTROSE 50 % IV SOLN
INTRAVENOUS | Status: AC
  Filled 2013-11-26: qty 50

## 2013-11-26 MED ORDER — SODIUM CHLORIDE 0.9 % IV SOLN
1.0000 g | Freq: Once | INTRAVENOUS | Status: AC
  Administered 2013-11-26: 1 g via INTRAVENOUS
  Filled 2013-11-26: qty 10

## 2013-11-26 MED ORDER — POTASSIUM CHLORIDE 10 MEQ/50ML IV SOLN
10.0000 meq | INTRAVENOUS | Status: AC
  Administered 2013-11-26 (×2): 10 meq via INTRAVENOUS
  Filled 2013-11-26 (×6): qty 50

## 2013-11-26 MED ORDER — POTASSIUM CHLORIDE 10 MEQ/50ML IV SOLN
10.0000 meq | INTRAVENOUS | Status: AC
  Administered 2013-11-26 (×5): 10 meq via INTRAVENOUS
  Filled 2013-11-26: qty 50

## 2013-11-26 MED ORDER — BIOTENE DRY MOUTH MT LIQD
15.0000 mL | OROMUCOSAL | Status: DC
  Administered 2013-11-26 – 2013-11-27 (×6): 15 mL via OROMUCOSAL

## 2013-11-26 NOTE — Progress Notes (Signed)
PULMONARY / CRITICAL CARE MEDICINE   Name: Elizabeth Orr MRN: [id number] DOB: 1960/12/14    ADMISSION DATE:  11/22/2013 CONSULTATION DATE:  12/08/2013  REFERRING MD :  EDP PRIMARY SERVICE: PCCM  CHIEF COMPLAINT:  Abdominal pain  BRIEF PATIENT DESCRIPTION: 53 yo w hx breast CA and alcoholic cirrhosis, presented 4/7 with 2 day history of altered mental status. In ED was hypotensive 48/28 and GCS 4. Occult blood positive. Intubated in ED. PCCM asked to admit.   SIGNIFICANT EVENTS / STUDIES:  CT Head  4/7 >>> no acute findings TTE 4/8 >> normal LV size and fxn, normal RV, normal RVSP CT abd/pelvis 4/9 >> LLL infiltrate, small L effusion, hepatomegaly, small ascites, no evidence bowel injury, small fat containing umbilical hernia  LINES / TUBES: OETT 4/7 >>> OGT 4/7 >>> R IJ CVL 4/7 >>> L chest port >>> R radial art line 4/9 >>   CULTURES: 4/7  Blood  >>> 4/7  Urine  >>> enterococcus (vanco sens) 4/7  Sputum  >>>  ANTIBIOTICS: Zosyn 4/7 >>> Vancomycin 4/7 >>>  INTERVAL HISTORY:   norepi down to 8, remains on vasopressin Still GCS 4, no sedation has been given  VITAL SIGNS: Temp:  [98.4 F (36.9 C)-100 F (37.8 C)] 99.1 F (37.3 C) (04/10 0800) Pulse Rate:  [25-130] 41 (04/10 0800) Resp:  [19-30] 20 (04/10 0800) BP: (83-135)/(28-103) 110/61 mmHg (04/10 0800) SpO2:  [81 %-100 %] 100 % (04/10 0800) FiO2 (%):  [50 %] 50 % (04/10 0800) Weight:  [90.9 kg (200 lb 6.4 oz)] 90.9 kg (200 lb 6.4 oz) (04/10 0600)  HEMODYNAMICS: CVP:  [2 mmHg-11 mmHg] 11 mmHg VENTILATOR SETTINGS: Vent Mode:  [-] PRVC FiO2 (%):  [50 %] 50 % Set Rate:  [14 bmp] 14 bmp Vt Set:  [550 mL] 550 mL PEEP:  [8 cmH20] 8 cmH20 Plateau Pressure:  [16 cmH20-29 cmH20] 16 cmH20  INTAKE / OUTPUT: Intake/Output     04/09 0701 - 04/10 0700 04/10 0701 - 04/11 0700   P.O. 450    I.V. (mL/kg) 3304.4 (36.4) 144 (1.6)   NG/GT 857.5    IV Piggyback 350 50   Total Intake(mL/kg) 4961.9 (54.6) 194 (2.1)   Urine  (mL/kg/hr) 60 (0) 75 (0.4)   Emesis/NG output 450 (0.2)    Stool 601 (0.3)    Total Output 1111 75   Net +3850.9 +119          PHYSICAL EXAMINATION: General:  Ventilated synchronous Neuro:  GCS 4, sluggish pupils, grimace w pain HEENT:  NO JVD Cardiovascular:  RRR, no murmurs Lungs:  Decreased at bases, scattered rhonchi Abdomen:  Soft, non-distended Musculoskeletal:  No deformities Skin:  Pressure ulcers stage 1, 2 to 3+ pitting peripheral edema   LABS:  CBC  Recent Labs Lab 12/15/2013 2134  12/02/2013 0500 11/27/2013 1120 12/04/2013 1717 11/26/13 0750  WBC 15.6*  --  34.1*  --   --  38.9*  HGB 12.2  < > 12.9 11.4* 10.9* 11.1*  HCT 36.3  < > 38.7 35.2* 32.9* 32.2*  PLT 233  --  274  --   --  161  < > = values in this interval not displayed. Coag's  Recent Labs Lab 11/22/2013 2134 11/26/2013 0500 11/26/13 0455  APTT  --   --  39*  INR 2.27* 1.79* 2.10*   BMET  Recent Labs Lab 11/24/13 1225 11/23/2013 0500 11/26/13 0455  NA 136* 139 136*  K 3.3* 3.5* 2.7*  CL 97 99  91*  CO2 17* 11* 22  BUN 28* 31* 35*  CREATININE 3.59* 3.95* 3.80*  GLUCOSE 132* 139* 134*   Electrolytes  Recent Labs Lab 11/24/13 1225 11/26/2013 0500 11/26/13 0455  CALCIUM 7.5* 8.2* 7.5*  MG  --  1.9 1.7  PHOS  --  7.0* 4.0   Sepsis Markers  Recent Labs Lab 11/24/13 1823 12/05/2013 0023 11/27/2013 0500 11/26/13 0455  LATICACIDVEN 5.6* 7.1*  --  4.9*  PROCALCITON  --   --  9.72 11.33   ABG  Recent Labs Lab 12/09/2013 0317 11/26/2013 1528 11/26/13 0347  PHART 7.131* 7.319* 7.437  PCO2ART 38.1 35.8 35.6  PO2ART 84.0 78.0* 126.0*   Liver Enzymes  Recent Labs Lab 11/21/2013 2134 11/26/13 0455  AST 70* 83*  ALT 15 22  ALKPHOS 221* 175*  BILITOT 6.6* 6.5*  ALBUMIN 1.2* 0.9*   Cardiac Enzymes  Recent Labs Lab 11/24/13 1224 11/24/13 1824 11/27/2013 0024  TROPONINI <0.30 <0.30 <0.30   Glucose  Recent Labs Lab 12/10/2013 0729 12/11/2013 1149 11/20/2013 1548 12/02/2013 2319  11/26/13 0324 11/26/13 0736  GLUCAP 132* 159* 127* 118* 127* 121*    IMAGING:   Ct Abdomen Pelvis Wo Contrast  11/30/2013   CLINICAL DATA:  septic shock, ? abdominal source  EXAM: CT ABDOMEN AND PELVIS WITHOUT CONTRAST  TECHNIQUE: Multidetector CT imaging of the abdomen and pelvis was performed following the standard protocol without intravenous contrast.  COMPARISON:  CT ABD - PELV W/ CM dated 10/31/2013  FINDINGS: Consolidative nodular density with air bronchograms left lung base. Diffuse thickening of interstitial markings with areas of tree-in-bud configuration an multifocal confluent ground-glass densities is appreciated. Areas of scarring versus atelectasis within the anterior lung bases right greater than left. Small left effusion is appreciated.  There is diffuse low attenuation throughout the liver, hepatomegaly, and a very small amount of perihepatic ascites. There are no calcified gallstones. Trace amount of fluid is identified anteriorly within the gallbladder fossa appears contiguous with the hepatic ascites.  Small amount of ascites is identified within the left upper quadrant and in the perisplenic region. The spleen, adrenals, pancreas, kidneys are unremarkable.  There is no evidence of bowel obstruction, enteritis, colitis, nor diverticulitis.  A moderate amount of ascites is appreciated within the pelvis, increased from prior.  No loculated fluid collections are appreciated, masses, nor adenopathy within the limitations of a non IV contrast CT.  No abdominal aortic aneurysm appreciated.  A small fat containing umbilical hernia is appreciated. There is no evidence of an inguinal hernia.  There no aggressive appearing osseous lesions.  IMPRESSION: Increased ascites within the pelvis and development of a small amount of abdominal ascites when compared to previous study. Otherwise no obstructive or inflammatory abnormalities within the abdomen or pelvis. These findings raise concern of the  sequela of an indolent or chronic infectious or inflammatory etiology.  Stable hepatic steatosis and hepatomegaly  Interstitial pneumonitis within the lung bases with a consolidative component left lung base differential considerations are consolidated pneumonitis versus atelectasis.  Small left effusion.   Electronically Signed   By: Margaree Mackintosh M.D.   On:  12:30   ASSESSMENT / PLAN:  PULMONARY A: Acute respiratory failure in setting of acute encephalopathy Respiratory acidosis, improved; metabolic acidosis  LLL (Possible aspiration) pneumonia Small L pleural effusion P:    VAP bundle Daily SBT > no resp effort to date on no sedation Trend ABG/CXR Albuterol PRN Liberalized Vt on 4/9 as no evidence for ALI Consider L thora  although effusion is small, likely not enough to get at this time  CARDIOVASCULAR A:  Shock presumed due to sepsis + hepatic disease; presented with small hematemesis but doubt hemorrhagic shock with stable serial Hb. TTE shows intact LV fxn.  P:  Goal MAP > 65 Volume based on CVP > goal 10-12 (currently 11) Levophed gtt + vasopressin, plan to turn vasopressin off when norepi down to 5 No evidence adrenal insufficiency > no hydrocort  RENAL A:   Acute anuric renal failure; ATN from shock state, consider possible hepatorenal syndrome. 4/10 beginning to have some UOP, S Cr plateau at 3.95 Hypokalemia  Severe AG metabolic acidosis due to ARF and lactate P:   Goal CVP 10-12 Na bicarb started 4/9 am, will d/c on 4/10 and follow Follow BMP, ABG Serial lactate  GASTROINTESTINAL A:   Alcoholic cirrhosis Doubt significant GI hemorrhage (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 and no ulcers / varices), Hgb stable Nutrition  P:   D/c'd Octreotide gtt D/c'd Protonix gtt Protonix bid TF per Nutritionist > poorly tolerated, will attempt to restart trickle feeds pm 4/10  HEMATOLOGIC A:  Coagulopathy in setting of cirrhosis Progressive  Leukocytosis 4/9-10 P:  Trend CBC / INR Folate  INFECTIOUS A:   LLL PNA (but no secretions) with small L effusion and small ascites Enterococcal UTI Progressive leukocytosis; CT scan abd and pelvis 4/9 > no evidence bowel injury, perf, etc P:   Empiric broad abx started 4/7, should cover UTI and PNA Follow cx data Not enough pleural or peritoneal fluid to tap at this time With rising WBC need to consider C diff (although has other reasons to explain diarrhea)  ENDOCRINE A:   At risk hypoglycemia Intact adrenal function > random cort 54 P: CBG q4h  NEUROLOGIC A:   Acute encephalopathy, suspect hepatic encephalopathy exacerbated by sepsis Hx EtOH abuse, last was 07/2013 P:   D/c'd Fentanyl / Versed > no sedation Lactulose tid  Rifaximin Thiamine Will repeat her Head CT 4/10  GLOBAL Critically ill, prognosis poor due to underlying liver disease. Discussed with husband and children at bedside 4/9 and 4/10. They understand that we are supporting fully. They want all aggressive measures that have a chance to reverse acute process but also understand that if she progresses to cardiac arrest that she has little chance for meaningful survival. We agreed that she should be limited code, no CPR or defib. All other measures are appropriate. They do not want to prolong her support, but are willing to continue this course AS LONG AS there are trends of improvement.   I have personally obtained history, examined patient, evaluated and interpreted laboratory and imaging results, reviewed medical records, formulated assessment / plan and placed orders.  CRITICAL CARE:  The patient is critically ill with multiple organ systems failure and requires high complexity decision making for assessment and support, frequent evaluation and titration of therapies, application of advanced monitoring technologies and extensive interpretation of multiple databases. Critical Care Time devoted to patient care  services described in this note is 40 minutes.    Baltazar Apo, MD, PhD 11/26/2013, 8:56 AM [facility] [phone] or if no answer [phone]

## 2013-11-26 NOTE — Progress Notes (Signed)
CRITICAL VALUE ALERT  Critical value received:  K:2.7  Date of notification:  11/26/2013   Time of notification:  0630  Critical value read back:yes  Nurse who received alert:  Eliane Decree, RN   MD notified (1st page):  Dr. Elsworth Soho  Time of first page:  [phone]  MD notified (2nd page):  Time of second page:  Responding MD:  Dr. Elsworth Soho  Time MD responded:  [phone]

## 2013-11-26 NOTE — Progress Notes (Signed)
Pt vomited while laying flat for portable head CT. OG tube hooked to low intermittent suction; output was 750cc. Also, pt has had no urine output since 0800. Notified Dr. Lamonte Sakai; no new orders received. Will continue to monitor.

## 2013-11-26 NOTE — Progress Notes (Signed)
eLink Physician-Brief Progress Note Patient Name: Elizabeth Orr DOB: 09-22-1960 MRN: [id number]  Date of Service  11/26/2013   HPI/Events of Note   oliguric  eICU Interventions  Hypokalemia -repleted Expect bicarb gtt to stop   Intervention Category Intermediate Interventions: Electrolyte abnormality - evaluation and management  Rigoberto Noel 11/26/2013, 6:34 AM

## 2013-11-26 NOTE — Progress Notes (Signed)
Hypoglycemic Event  CBG: 67  Treatment: D50 IV 25 mL  Symptoms: None  Follow-up CBG: Time:1625 CBG Result:126  Possible Reasons for Event: Vomiting and inadequate meal intake   Comments/MD notified: Dr. Vedia Coffer  Remember to initiate Hypoglycemia Order Set & complete

## 2013-11-27 ENCOUNTER — Inpatient Hospital Stay (HOSPITAL_COMMUNITY): Payer: Medicaid Other

## 2013-11-27 LAB — GLUCOSE, CAPILLARY
GLUCOSE-CAPILLARY: 133 mg/dL — AB (ref 70–99)
GLUCOSE-CAPILLARY: 76 mg/dL (ref 70–99)
GLUCOSE-CAPILLARY: 77 mg/dL (ref 70–99)
GLUCOSE-CAPILLARY: 79 mg/dL (ref 70–99)
GLUCOSE-CAPILLARY: 82 mg/dL (ref 70–99)
Glucose-Capillary: 71 mg/dL (ref 70–99)
Glucose-Capillary: 76 mg/dL (ref 70–99)
Glucose-Capillary: 73 mg/dL (ref 70–99)

## 2013-11-27 LAB — COMPREHENSIVE METABOLIC PANEL
ALBUMIN: 0.9 g/dL — AB (ref 3.5–5.2)
ALK PHOS: 150 U/L — AB (ref 39–117)
ALT: 24 U/L (ref 0–35)
ALT: 25 U/L (ref 0–35)
AST: 81 U/L — ABNORMAL HIGH (ref 0–37)
AST: 83 U/L — ABNORMAL HIGH (ref 0–37)
Albumin: 0.9 g/dL — ABNORMAL LOW (ref 3.5–5.2)
Alkaline Phosphatase: 189 U/L — ABNORMAL HIGH (ref 39–117)
BUN: 41 mg/dL — ABNORMAL HIGH (ref 6–23)
BUN: 44 mg/dL — ABNORMAL HIGH (ref 6–23)
CO2: 17 mEq/L — ABNORMAL LOW (ref 19–32)
CO2: 19 mEq/L (ref 19–32)
CREATININE: 3.69 mg/dL — AB (ref 0.50–1.10)
CREATININE: 3.73 mg/dL — AB (ref 0.50–1.10)
Calcium: 7.6 mg/dL — ABNORMAL LOW (ref 8.4–10.5)
Calcium: 7.7 mg/dL — ABNORMAL LOW (ref 8.4–10.5)
Chloride: 88 mEq/L — ABNORMAL LOW (ref 96–112)
Chloride: 88 mEq/L — ABNORMAL LOW (ref 96–112)
GFR, EST AFRICAN AMERICAN: 15 mL/min — AB (ref >90)
GFR, EST AFRICAN AMERICAN: 15 mL/min — AB (ref >90)
GFR, EST NON AFRICAN AMERICAN: 13 mL/min — AB (ref >90)
GFR, EST NON AFRICAN AMERICAN: 13 mL/min — AB (ref >90)
GLUCOSE: 83 mg/dL (ref 70–99)
Glucose, Bld: 81 mg/dL (ref 70–99)
POTASSIUM: 4.3 meq/L (ref 3.7–5.3)
Potassium: 3.6 mEq/L — ABNORMAL LOW (ref 3.7–5.3)
Sodium: 131 mEq/L — ABNORMAL LOW (ref 137–147)
Sodium: 132 mEq/L — ABNORMAL LOW (ref 137–147)
Total Bilirubin: 6.7 mg/dL — ABNORMAL HIGH (ref 0.3–1.2)
Total Bilirubin: 7.1 mg/dL — ABNORMAL HIGH (ref 0.3–1.2)
Total Protein: 4.4 g/dL — ABNORMAL LOW (ref 6.0–8.3)
Total Protein: 4.4 g/dL — ABNORMAL LOW (ref 6.0–8.3)

## 2013-11-27 LAB — PROTIME-INR
INR: 2.02 — ABNORMAL HIGH (ref 0.00–1.49)
Prothrombin Time: 22.2 seconds — ABNORMAL HIGH (ref 11.6–15.2)

## 2013-11-27 LAB — CBC
HCT: 32.2 % — ABNORMAL LOW (ref 36.0–46.0)
Hemoglobin: 11.3 g/dL — ABNORMAL LOW (ref 12.0–15.0)
MCH: 34.9 pg — ABNORMAL HIGH (ref 26.0–34.0)
MCHC: 35.1 g/dL (ref 30.0–36.0)
MCV: 99.4 fL (ref 78.0–100.0)
PLATELETS: 134 10*3/uL — AB (ref 150–400)
RBC: 3.24 MIL/uL — ABNORMAL LOW (ref 3.87–5.11)
RDW: 15.4 % (ref 11.5–15.5)
WBC: 36.7 10*3/uL — ABNORMAL HIGH (ref 4.0–10.5)

## 2013-11-27 LAB — PHOSPHORUS
PHOSPHORUS: 5.3 mg/dL — AB (ref 2.3–4.6)
Phosphorus: 4.9 mg/dL — ABNORMAL HIGH (ref 2.3–4.6)

## 2013-11-27 LAB — PROCALCITONIN: Procalcitonin: 10.79 ng/mL

## 2013-11-27 LAB — MAGNESIUM
Magnesium: 1.8 mg/dL (ref 1.5–2.5)
Magnesium: 1.8 mg/dL (ref 1.5–2.5)

## 2013-11-27 LAB — AMMONIA: Ammonia: 157 umol/L — ABNORMAL HIGH (ref 11–60)

## 2013-11-27 LAB — LACTIC ACID, PLASMA: Lactic Acid, Venous: 5.8 mmol/L — ABNORMAL HIGH (ref 0.5–2.2)

## 2013-11-27 MED ORDER — MIDAZOLAM HCL 2 MG/2ML IJ SOLN
4.0000 mg | Freq: Once | INTRAMUSCULAR | Status: AC
  Administered 2013-11-27: 4 mg via INTRAVENOUS

## 2013-11-27 MED ORDER — MAGNESIUM SULFATE IN D5W 10-5 MG/ML-% IV SOLN
1.0000 g | Freq: Once | INTRAVENOUS | Status: DC
  Filled 2013-11-27: qty 100

## 2013-11-27 MED ORDER — BIOTENE DRY MOUTH MT LIQD
15.0000 mL | Freq: Four times a day (QID) | OROMUCOSAL | Status: DC
  Administered 2013-11-27 (×2): 15 mL via OROMUCOSAL

## 2013-11-27 MED ORDER — SODIUM CHLORIDE 0.9 % IV BOLUS (SEPSIS)
500.0000 mL | Freq: Once | INTRAVENOUS | Status: AC
  Administered 2013-11-27: 500 mL via INTRAVENOUS

## 2013-11-27 MED ORDER — LACTULOSE 10 GM/15ML PO SOLN
30.0000 g | Freq: Three times a day (TID) | ORAL | Status: DC
  Administered 2013-11-27: 30 g
  Filled 2013-11-27: qty 45

## 2013-11-27 MED ORDER — MIDAZOLAM HCL 2 MG/2ML IJ SOLN
INTRAMUSCULAR | Status: AC
  Administered 2013-11-27: 4 mg via INTRAVENOUS
  Filled 2013-11-27: qty 4

## 2013-11-27 NOTE — Progress Notes (Signed)
[facility] Progress Note Patient Name: Elizabeth Orr DOB: 11/01/1960 MRN: [id number]  Date of Service  11/27/2013   HPI/Events of Note   Electrolytes reviewed - hypokalemia, hyponatremia, and hypomagnesemia, none of which are likely to be responsible for seizure like activity noted.   eICU Interventions   Hold off replacing K given AKI. Replace Mg. Monitor Na.   Intervention Category Intermediate Interventions: Electrolyte abnormality - evaluation and management  [name] 11/27/2013, 7:59 PM

## 2013-11-27 NOTE — Progress Notes (Signed)
Chaplain visited with pt's family at bedside.  Family discussed wishes of pt and each other's wishes regarding possible removal of life support.   Chaplain listened empathically.     11/27/13 2100  Clinical Encounter Type  Visited With Family  Visit Type Spiritual support;Critical Care    Estelle June, chaplain pager [phone]

## 2013-11-27 NOTE — Progress Notes (Signed)
During patient's bath, patient was noted to have a right-sided facial twitch and right arm twitch lasting approximately 30 seconds. Five minutes later, a second episode with the same presentation lasted approximately 10 seconds. The patient's vitals remained stable through both episodes. E-link was made aware. Will continue to monitor patient and update on condition as needed.

## 2013-11-27 NOTE — Progress Notes (Signed)
[facility] Progress Note Patient Name: Elizabeth Orr DOB: 02/26/1961 MRN: [id number]  Date of Service  11/27/2013   HPI/Events of Note   Discussed worsening edema with family.  eICU Interventions   Family does not wish to have neurosurgical evaluation. Will continue current interventions for now with plan to regroup on Monday.    Intervention Category Major Interventions: End of life / care limitation discussion  [name] 11/27/2013, 9:02 PM

## 2013-11-27 NOTE — Progress Notes (Signed)
[facility] Progress Note Patient Name: Elizabeth Orr DOB: 08-May-1961 MRN: [id number]  Date of Service  11/27/2013   HPI/Events of Note   CT Results Discussed with Radiology.  eICU Interventions   Discussed [place] with family. They are discussing amongst themselves. If they would like to continue aggressive measures will discuss cases with NSG for possible IV pressure monitor placement.     Intervention Category Major Interventions: End of life / care limitation discussion  [name] 11/27/2013, 8:21 PM

## 2013-11-27 NOTE — Progress Notes (Addendum)
PULMONARY / CRITICAL CARE MEDICINE   Name: Elizabeth Orr MRN: [id number] DOB: [birth year]    ADMISSION DATE:  11/22/2013 CONSULTATION DATE:  12/13/2013  REFERRING MD :  EDP PRIMARY SERVICE: PCCM  CHIEF COMPLAINT:  Abdominal pain  BRIEF PATIENT DESCRIPTION: 53 yo w hx breast CA and alcoholic cirrhosis, presented 4/7 with 2 day history of altered mental status. In ED was hypotensive 48/28 and GCS 4. Occult blood positive. Intubated in ED. PCCM asked to admit.   SIGNIFICANT EVENTS / STUDIES:  CT Head  4/7 >>> no acute findings TTE 4/8 >> normal LV size and fxn, normal RV, normal RVSP CT abd/pelvis 4/9 >> LLL infiltrate, small L effusion, hepatomegaly, small ascites, no evidence bowel injury, small fat containing umbilical hernia CT head 410>>> developing cerebral edema r/t hypoxic ischemic insult, early changes of cortical edema and mild diffuse brain swelling   LINES / TUBES: OETT 4/7 >>> OGT 4/7 >>> R IJ CVL 4/7 >>> L chest port >>> R radial art line 4/9 >> 4/10  CULTURES: 4/7  Blood  >>>ng 4/7  Urine  >>> enterococcus (vanco sens) 4/7  Sputum  >>> 4/10 CDiff >>>NEG  ANTIBIOTICS: Zosyn 4/7 >>> Vancomycin 4/7 >>>  INTERVAL HISTORY:   Remains on levophed.  Worsening lactate, leukocytosis.  Likely aspiration event during CT head which shows developing edema   VITAL SIGNS: Temp:  [97.9 F (36.6 C)-99.9 F (37.7 C)] 99 F (37.2 C) (04/11 0400) Pulse Rate:  [25-121] 99 (04/11 0737) Resp:  [13-30] 16 (04/11 0737) BP: (72-112)/(47-88) 96/63 mmHg (04/11 0737) SpO2:  [97 %-100 %] 98 % (04/11 0737) FiO2 (%):  [30 %-50 %] 30 % (04/11 0737) Weight:  [205 lb 0.4 oz (93 kg)] 205 lb 0.4 oz (93 kg) (04/11 0500)  HEMODYNAMICS: CVP:  [5 mmHg-14 mmHg] 8 mmHg VENTILATOR SETTINGS: Vent Mode:  [-] PRVC FiO2 (%):  [30 %-50 %] 30 % Set Rate:  [14 bmp] 14 bmp Vt Set:  [550 mL] 550 mL PEEP:  [8 cmH20] 8 cmH20 Plateau Pressure:  [12 [id number] cmH20] 20 cmH20  INTAKE /  OUTPUT: Intake/Output     04/10 0701 - 04/11 0700 04/11 0701 - 04/12 0700   P.O.     I.V. (mL/kg) 1344.1 (14.5) 35.3 (0.4)   NG/GT 110    IV Piggyback 610    Total Intake(mL/kg) 2064.1 (22.2) 35.3 (0.4)   Urine (mL/kg/hr) 170 (0.1)    Emesis/NG output 1500 (0.7)    Stool 575 (0.3)    Total Output 2245     Net -180.9 +35.3        Emesis Occurrence 1 x      PHYSICAL EXAMINATION: General:  Ventilated synchronous Neuro:  GCS 3, sluggish pupils, no response to pain, corneals brisk BL  HEENT:  NO JVD Cardiovascular:  RRR, no murmurs Lungs:  resps even non labored on vent, coarse  Abdomen:  Soft, non-distended Musculoskeletal:  No deformities Skin:  Pressure ulcers stage 1, 3+ pitting peripheral edema   LABS:  CBC  Recent Labs Lab 11/24/2013 0500  12/14/2013 1717 11/26/13 0750 11/27/13 0500  WBC 34.1*  --   --  38.9* 36.7*  HGB 12.9  < > 10.9* 11.1* 11.3*  HCT 38.7  < > 32.9* 32.2* 32.2*  PLT 274  --   --  161 134*  < > = values in this interval not displayed. Coag's  Recent Labs Lab 12/12/2013 0500 11/26/13 0455 11/27/13 0500  APTT  --  39*  --  INR 1.79* 2.10* 2.02*   BMET  Recent Labs Lab 11/26/13 0455 11/26/13 2000 11/27/13 0500  NA 136* 136* 131*  K 2.7* 4.0 4.3  CL 91* 92* 88*  CO2 22 18* 17*  BUN 35* 36* 41*  CREATININE 3.80* 3.81* 3.69*  GLUCOSE 134* 85 83   Electrolytes  Recent Labs Lab 11/18/2013 0500 11/26/13 0455 11/26/13 2000 11/27/13 0500  CALCIUM 8.2* 7.5* 7.9* 7.6*  MG 1.9 1.7  --  1.8  PHOS 7.0* 4.0  --  4.9*   Sepsis Markers  Recent Labs Lab 12/11/2013 0023 11/24/2013 0500 11/26/13 0455 11/27/13 0500  LATICACIDVEN 7.1*  --  4.9* 5.8*  PROCALCITON  --  9.72 11.33 10.79   ABG  Recent Labs Lab 11/26/2013 0317 12/15/2013 1528 11/26/13 0347  PHART 7.131* 7.319* 7.437  PCO2ART 38.1 35.8 35.6  PO2ART 84.0 78.0* 126.0*   Liver Enzymes  Recent Labs Lab 11/30/2013 2134 11/26/13 0455 11/27/13 0500  AST 70* 83* 81*  ALT 15 22  24   ALKPHOS 221* 175* 150*  BILITOT 6.6* 6.5* 6.7*  ALBUMIN 1.2* 0.9* 0.9*   Cardiac Enzymes  Recent Labs Lab 11/24/13 1224 11/24/13 1824 12/12/2013 0024  TROPONINI <0.30 <0.30 <0.30   Glucose  Recent Labs Lab 11/26/13 1620 11/26/13 1629 11/26/13 1947 11/27/13 0007 11/27/13 0451 11/27/13 0733  GLUCAP 132* 126* 78 79 76 77    IMAGING:   Ct Abdomen Pelvis Wo Contrast  12/05/2013   CLINICAL DATA:  septic shock, ? abdominal source  EXAM: CT ABDOMEN AND PELVIS WITHOUT CONTRAST  TECHNIQUE: Multidetector CT imaging of the abdomen and pelvis was performed following the standard protocol without intravenous contrast.  COMPARISON:  CT ABD - PELV W/ CM dated 10/31/2013  FINDINGS: Consolidative nodular density with air bronchograms left lung base. Diffuse thickening of interstitial markings with areas of tree-in-bud configuration an multifocal confluent ground-glass densities is appreciated. Areas of scarring versus atelectasis within the anterior lung bases right greater than left. Small left effusion is appreciated.  There is diffuse low attenuation throughout the liver, hepatomegaly, and a very small amount of perihepatic ascites. There are no calcified gallstones. Trace amount of fluid is identified anteriorly within the gallbladder fossa appears contiguous with the hepatic ascites.  Small amount of ascites is identified within the left upper quadrant and in the perisplenic region. The spleen, adrenals, pancreas, kidneys are unremarkable.  There is no evidence of bowel obstruction, enteritis, colitis, nor diverticulitis.  A moderate amount of ascites is appreciated within the pelvis, increased from prior.  No loculated fluid collections are appreciated, masses, nor adenopathy within the limitations of a non IV contrast CT.  No abdominal aortic aneurysm appreciated.  A small fat containing umbilical hernia is appreciated. There is no evidence of an inguinal hernia.  There no aggressive appearing  osseous lesions.  IMPRESSION: Increased ascites within the pelvis and development of a small amount of abdominal ascites when compared to previous study. Otherwise no obstructive or inflammatory abnormalities within the abdomen or pelvis. These findings raise concern of the sequela of an indolent or chronic infectious or inflammatory etiology.  Stable hepatic steatosis and hepatomegaly  Interstitial pneumonitis within the lung bases with a consolidative component left lung base differential considerations are consolidated pneumonitis versus atelectasis.  Small left effusion.   Electronically Signed   By: Margaree Mackintosh M.D.   On: 11/25/2013 12:30   Ct Portable Head W/o Cm  11/26/2013   CLINICAL DATA:  Significant hypotensive event, possibly precipitated by sepsis and  hepatic disease. Acute renal failure, acute respiratory failure, alcoholic cirrhosis, and possible hepatic encephalopathy. Low GCS reportedly 3-4.  EXAM: CT HEAD WITHOUT CONTRAST  TECHNIQUE: Contiguous axial images were obtained from the base of the skull through the vertex without contrast.  COMPARISON:  12/02/2013  FINDINGS: Study performed with the portable CT device. Image quality is reduced.  Compared with the prior CT from 11/22/2013, there is subtle developing diffuse cerebral edema. This can be observed on slices through the superior vermian cistern, sylvian fissures, suprasellar cistern, and cortical sulci, all of which reveal mild effacement of the CSF spaces. There is patchy, but not complete, loss of gray-white differentiation in several locations. No parenchymal hemorrhage or lobar infarction. No incipient herniation. Sinus air-fluid levels due to prolonged recumbency.  IMPRESSION: Suspect developing cerebral edema related to hypoxic ischemic insult. Early changes of cortical edema and mild diffuse brain swelling when compared with CT 11/20/2013. See discussion above.   Electronically Signed   By: Rolla Flatten M.D.   On: 11/26/2013 13:45    ASSESSMENT / PLAN:  PULMONARY A: Acute respiratory failure in setting of acute encephalopathy Respiratory acidosis, improved; metabolic acidosis  LLL (Possible aspiration) pneumonia Small L pleural effusion - not enough to tap  P:   VAP bundle -no SBTs  CXR now  Albuterol PRN Abx as above - likely new aspiration event 4/10   CARDIOVASCULAR A:  Shock presumed due to sepsis + hepatic disease; presented with small hematemesis but doubt hemorrhagic shock with stable serial Hb. TTE shows intact LV fxn.  P:  Goal MAP > 65 Volume based on CVP > goal 10-12 (547ml bolus 4/11 with CVP 8) Levophed gtt + vasopressin, plan to turn vasopressin off when norepi down to 5 No evidence adrenal insufficiency > no hydrocort  RENAL A:   Acute anuric renal failure; ATN from shock state, consider possible hepatorenal syndrome. 4/10 beginning to have some UOP, S Cr plateau at 3.95 Hypokalemia  Severe AG metabolic acidosis due to ARF and lactate - worsening lactate 4/11 P:   Goal CVP 10-12 Follow BMP, ABG Serial lactate   GASTROINTESTINAL A:   Alcoholic cirrhosis Doubt significant GI hemorrhage (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 and no ulcers / varices), Hgb stable Nutrition  P:   Protonix bid TF per Nutritionist > poorly tolerated, emesis - hold for now   HEMATOLOGIC A:  Coagulopathy in setting of cirrhosis Progressive Leukocytosis 4/9-10 P:  Trend CBC / INR Folate  INFECTIOUS A:   LLL PNA (but no secretions) with small L effusion and small ascites ?aspiration PNA  Enterococcal UTI Progressive leukocytosis; CT scan abd and pelvis 4/9 > no evidence bowel injury, perf, etc P:   Empiric broad abx started 4/7, should cover UTI and PNA Follow cx data Not enough pleural or peritoneal fluid to tap at this time With rising WBC need to consider C diff (although has other reasons to explain diarrhea) - CDiff NEG  ENDOCRINE A:   At risk hypoglycemia Intact adrenal  function > random cort 54 P: CBG q4h  NEUROLOGIC A:   Acute encephalopathy, suspect hepatic encephalopathy exacerbated by sepsis - developing cerebral edema Hx EtOH abuse, last was 07/2013 Ammonia 157 4/11 P:   D/c'd Fentanyl / Versed > no sedation Lactulose tid  Rifaximin Thiamine    GLOBAL Critically ill, prognosis poor due to underlying liver disease. Discussed with husband and children at bedside 4/9 and 4/10. They understand that we are supporting fully. They want all aggressive measures  that have a chance to reverse acute process but also understand that if she progresses to cardiac arrest that she has little chance for meaningful survival. We agreed that she should be limited code, no CPR or defib. All other measures are appropriate. They do not want to prolong her support, but are willing to continue this course AS LONG AS there are trends of improvement.    4/11 - worsening overall - need to discuss further with family and consider transition to comfort.       Marijean Heath, NP 11/27/2013  8:50 AM Pager: [phone] or [phone]  CRITICAL CARE:  The patient is critically ill with multiple organ systems failure and requires high complexity decision making for assessment and support, frequent evaluation and titration of therapies, application of advanced monitoring technologies and extensive interpretation of multiple databases. Critical Care Time devoted to patient care services described in this note is 40 minutes.   *Care during the described time interval was provided by me and/or other providers on the critical care team. I have reviewed this patient's available data, including medical history, events of note, physical examination and test results as part of my evaluation.  Rigoberto Noel

## 2013-11-27 NOTE — Progress Notes (Signed)
[facility] Progress Note Patient Name: Elizabeth Orr DOB: 09-Apr-1961 MRN: [id number]  Date of Service  11/27/2013   HPI/Events of Note   A line dislodged.  Weaning off pressors.  eICU Interventions  Defer replacing A line for now.   Intervention Category Minor Interventions: Other:  [name] 11/27/2013, 5:40 AM

## 2013-11-27 NOTE — Progress Notes (Signed)
[facility] Progress Note Patient Name: Elizabeth Orr DOB: [birth year] MRN: [id number]  Date of Service  11/27/2013   HPI/Events of Note   Several runs of repetitive muscle contractions noted by RN. One episode witnessed by me. ? Seizure activity given severe encephalopathy.   eICU Interventions   Check electrolytes and head CT stat. Family notified and at bedside. Unclear if they understand the significance of the seizures. Will continue discussions with them.        [name] 11/27/2013, 6:15 PM

## 2013-11-27 NOTE — Progress Notes (Signed)
0530 Arterial line dislodged while turning and repositioning the patient. Manual pressure held and pressure dressing applied. Notified Dr. Halford Chessman. Instructed to defer on replacing the line for now.

## 2013-11-27 NOTE — Progress Notes (Signed)
Chaplain entered pt's room and introduced himself and offered support.  Chaplain gave support through compassionate presence and by offering a prayer.   11/27/13 1300  Clinical Encounter Type  Visited With Patient and family together  Visit Type Spiritual support  Referral From [facility], chaplain [phone]

## 2013-11-28 MED ORDER — LORAZEPAM BOLUS VIA INFUSION: 2.0000 mg | INTRAVENOUS | Status: DC | PRN

## 2013-11-28 MED ORDER — MORPHINE BOLUS VIA INFUSION
5.0000 mg | INTRAVENOUS | Status: DC | PRN
  Filled 2013-11-28: qty 20

## 2013-11-28 MED ORDER — ATROPINE SULFATE 1 % OP SOLN
4.0000 [drp] | OPHTHALMIC | Status: DC | PRN
  Administered 2013-11-28: 4 [drp] via SUBLINGUAL
  Filled 2013-11-28: qty 2

## 2013-11-28 MED ORDER — MORPHINE SULFATE 10 MG/ML IJ SOLN
10.0000 mg/h | INTRAMUSCULAR | Status: DC
  Administered 2013-11-28: 10 mg/h via INTRAVENOUS
  Administered 2013-11-28: 12 mg/h via INTRAVENOUS
  Filled 2013-11-28 (×2): qty 10

## 2013-11-28 MED ORDER — SODIUM CHLORIDE 0.9 % IV SOLN
1.0000 mg/h | INTRAVENOUS | Status: DC
  Administered 2013-11-28: 1 mg/h via INTRAVENOUS
  Filled 2013-11-28 (×2): qty 10

## 2013-11-28 MED ORDER — LORAZEPAM 2 MG/ML IJ SOLN: 5.0000 mg/h | INTRAVENOUS | Status: DC

## 2013-11-29 NOTE — Discharge Summary (Signed)
PULMONARY / CRITICAL CARE MEDICINE   Name: Elizabeth Orr MRN: [id number] DOB: 09-03-1960    ADMISSION DATE:  12/11/2013 CONSULTATION DATE:  12/04/2013  REFERRING MD :  EDP PRIMARY SERVICE: PCCM  CHIEF COMPLAINT:  Abdominal pain  BRIEF PATIENT DESCRIPTION: 53 yo w hx breast CA and alcoholic cirrhosis, presented 4/7 with 2 day history of altered mental status. In ED was hypotensive 48/28 and GCS 4. Occult blood positive. Intubated in ED. PCCM asked to admit.   SIGNIFICANT EVENTS / STUDIES:  CT Head  4/7 >>> no acute findings TTE 4/8 >> normal LV size and fxn, normal RV, normal RVSP CT abd/pelvis 4/9 >> LLL infiltrate, small L effusion, hepatomegaly, small ascites, no evidence bowel injury, small fat containing umbilical hernia CT head 410>>> developing cerebral edema r/t hypoxic ischemic insult, early changes of cortical edema and mild diffuse brain swelling   LINES / TUBES: OETT 4/7 >>> OGT 4/7 >>> R IJ CVL 4/7 >>> L chest port >>> R radial art line 4/9 >> 4/10  CULTURES: 4/7  Blood  >>>ng 4/7  Urine  >>> enterococcus (vanco sens) 4/10 CDiff >>>NEG  ANTIBIOTICS: Zosyn 4/7 >>> Vancomycin 4/7 >>>  COURSE :  PULMONARY A: Acute respiratory failure in setting of acute encephalopathy Respiratory acidosis, improved; metabolic acidosis  LLL (Possible aspiration) pneumonia Small L pleural effusion - not enough to tap  P:   Albuterol PRN  likely new aspiration event 4/10   CARDIOVASCULAR A:  Shock presumed due to sepsis + hepatic disease; presented with small hematemesis but doubt hemorrhagic shock with stable serial Hb. TTE shows intact LV fxn.  P:  Levophed gtt + vasopressin No evidence adrenal insufficiency > no hydrocort  RENAL A:   Acute anuric renal failure; ATN from shock state, consider possible hepatorenal syndrome. 4/10 beginning to have some UOP, S Cr plateau at 3.95 Hypokalemia  Severe AG metabolic acidosis due to ARF and lactate - worsening lactate 4/11 P:       GASTROINTESTINAL A:   Alcoholic cirrhosis Doubt significant GI hemorrhage (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 and no ulcers / varices), Hgb stable Nutrition  P:   Protonix bid TF per Nutritionist   HEMATOLOGIC A:  Coagulopathy in setting of cirrhosis Progressive Leukocytosis 4/9-10 P:   INFECTIOUS A:   LLL PNA (but no secretions) with small L effusion and small ascites ?aspiration PNA  Enterococcal UTI Progressive leukocytosis; CT scan abd and pelvis 4/9 > no evidence bowel injury, perf, etc P:   Empiric broad abx started 4/7, should cover UTI and PNA CDiff NEG  ENDOCRINE A:   At risk hypoglycemia Intact adrenal function > random cort 54 P: CBG q4h  NEUROLOGIC A:   Acute encephalopathy, suspect hepatic encephalopathy exacerbated by sepsis - developing cerebral edema Hx EtOH abuse, last was 07/2013 Ammonia 157 4/11 P:   Lactulose tid  Rifaximin Thiamine    GLOBAL Critically ill, prognosis poor due to underlying liver disease. Discussed with husband and children at bedside 4/9 and 4/10. They understand that we are supporting fully. They want all aggressive measures that have a chance to reverse acute process but also understand that if she progresses to cardiac arrest that she has little chance for meaningful survival. We agreed that she should be limited code, no CPR or defib. All other measures are appropriate. They do not want to prolong her support, but are willing to continue this course AS LONG AS there are trends of improvement.    4/11 -  worsening overall - need to discuss further with family and consider transition to comfort. 4/12- comfort care -family on board     Cause of death - Hepatic encephalopathy, acute respiratory failure, acute renal failure, septic shock    Rigoberto Noel MD

## 2013-11-30 LAB — CULTURE, BLOOD (ROUTINE X 2)
CULTURE: NO GROWTH
Culture: NO GROWTH

## 2013-12-01 ENCOUNTER — Telehealth: Payer: Self-pay

## 2013-12-01 NOTE — Telephone Encounter (Signed)
Rec'd Death Certificate from [place] [facility] to pick up 12/02/2013

## 2013-12-07 ENCOUNTER — Ambulatory Visit: Payer: Medicaid Other | Admitting: Gastroenterology

## 2013-12-13 ENCOUNTER — Encounter (HOSPITAL_COMMUNITY): Payer: Self-pay | Admitting: *Deleted

## 2013-12-17 NOTE — Progress Notes (Signed)
PULMONARY / CRITICAL CARE MEDICINE   Name: Elizabeth Orr MRN: [id number] DOB: 1960/11/14    ADMISSION DATE:  11/18/2013 CONSULTATION DATE:  12/07/2013  REFERRING MD :  EDP PRIMARY SERVICE: PCCM  CHIEF COMPLAINT:  Abdominal pain  BRIEF PATIENT DESCRIPTION: 53 yo w hx breast CA and alcoholic cirrhosis, presented 4/7 with 2 day history of altered mental status. In ED was hypotensive 48/28 and GCS 4. Occult blood positive. Intubated in ED. PCCM asked to admit.   SIGNIFICANT EVENTS / STUDIES:  CT Head  4/7 >>> no acute findings TTE 4/8 >> normal LV size and fxn, normal RV, normal RVSP CT abd/pelvis 4/9 >> LLL infiltrate, small L effusion, hepatomegaly, small ascites, no evidence bowel injury, small fat containing umbilical hernia CT head 410>>> developing cerebral edema r/t hypoxic ischemic insult, early changes of cortical edema and mild diffuse brain swelling   LINES / TUBES: OETT 4/7 >>>4/12 OGT 4/7 >>>4/12 R IJ CVL 4/7 >>> L chest port >>> R radial art line 4/9 >> 4/10  CULTURES: 4/7  Blood  >>>ng 4/7  Urine  >>> enterococcus (vanco sens) 4/7  Sputum  >>> 4/10 CDiff >>>NEG  ANTIBIOTICS: Zosyn 4/7 >>>4/12 Vancomycin 4/7 >>>4/12  INTERVAL HISTORY:   Worsening overnight.  Family requested withdrawal of care.  Now extubated, agonal.  Husband at bedside.  Appears comfortable.   VITAL SIGNS: Temp:  [97.3 F (36.3 C)-98.4 F (36.9 C)] 97.3 F (36.3 C) (04/12 0700) Pulse Rate:  [25-98] 25 (04/12 0200) Resp:  [14-26] 15 (04/12 0700) BP: (87-126)/(23-76) 105/64 mmHg (04/12 0100) SpO2:  [87 %-100 %] 87 % (04/12 0200) FiO2 (%):  [30 %] 30 % (04/12 0000)  HEMODYNAMICS: CVP:  [5 mmHg-14 mmHg] 9 mmHg VENTILATOR SETTINGS: Vent Mode:  [-] PRVC FiO2 (%):  [30 %] 30 % Set Rate:  [14 bmp] 14 bmp Vt Set:  [550 mL] 550 mL PEEP:  [5 cmH20-8 cmH20] 5 cmH20 Plateau Pressure:  [22 cmH20-24 cmH20] 22 cmH20  INTAKE / OUTPUT: Intake/Output     04/11 0701 - 04/12 0700 04/12 0701 - 04/13  0700   I.V. (mL/kg) 676 (7.3)    NG/GT     IV Piggyback 800    Total Intake(mL/kg) 1476 (15.9)    Urine (mL/kg/hr) 49 (0)    Emesis/NG output 500 (0.2)    Stool 50 (0)    Total Output 599     Net +877            PHYSICAL EXAMINATION: General:  Actively dying  Neuro:  No response  HEENT:  NO JVD Cardiovascular:  RRR, no murmurs Lungs:  Agonal, non labored  Abdomen:  Soft, non-distended Musculoskeletal:  No deformities Skin:  Pressure ulcers stage 1, 3+ pitting peripheral edema   LABS:  CBC  Recent Labs Lab 11/17/2013 0500  12/09/2013 1717 11/26/13 0750 11/27/13 0500  WBC 34.1*  --   --  38.9* 36.7*  HGB 12.9  < > 10.9* 11.1* 11.3*  HCT 38.7  < > 32.9* 32.2* 32.2*  PLT 274  --   --  161 134*  < > = values in this interval not displayed. Coag's  Recent Labs Lab 12/02/2013 0500 11/26/13 0455 11/27/13 0500  APTT  --  39*  --   INR 1.79* 2.10* 2.02*   BMET  Recent Labs Lab 11/26/13 2000 11/27/13 0500 11/27/13 1830  NA 136* 131* 132*  K 4.0 4.3 3.6*  CL 92* 88* 88*  CO2 18* 17* 19  BUN  36* 41* 44*  CREATININE 3.81* 3.69* 3.73*  GLUCOSE 85 83 81   Electrolytes  Recent Labs Lab 11/26/13 0455 11/26/13 2000 11/27/13 0500 11/27/13 1830  CALCIUM 7.5* 7.9* 7.6* 7.7*  MG 1.7  --  1.8 1.8  PHOS 4.0  --  4.9* 5.3*   Sepsis Markers  Recent Labs Lab 11/21/2013 0023 11/30/2013 0500 11/26/13 0455 11/27/13 0500  LATICACIDVEN 7.1*  --  4.9* 5.8*  PROCALCITON  --  9.72 11.33 10.79   ABG  Recent Labs Lab 12/02/2013 0317 11/26/2013 1528 11/26/13 0347  PHART 7.131* 7.319* 7.437  PCO2ART 38.1 35.8 35.6  PO2ART 84.0 78.0* 126.0*   Liver Enzymes  Recent Labs Lab 11/26/13 0455 11/27/13 0500 11/27/13 1830  AST 83* 81* 83*  ALT 22 24 25   ALKPHOS 175* 150* 189*  BILITOT 6.5* 6.7* 7.1*  ALBUMIN 0.9* 0.9* 0.9*   Cardiac Enzymes  Recent Labs Lab 11/24/13 1224 11/24/13 1824 11/21/2013 0024  TROPONINI <0.30 <0.30 <0.30   Glucose  Recent Labs Lab  11/27/13 0451 11/27/13 0733 11/27/13 1110 11/27/13 1606 11/27/13 1631 11/27/13 1937  GLUCAP 76 77 71 76 82 73    IMAGING:   Dg Chest Portable 1 View  11/27/2013   CLINICAL DATA:  ?aspiration  EXAM: PORTABLE CHEST - 1 VIEW  COMPARISON:  DG CHEST 1V PORT dated 11/24/2013  FINDINGS: The patient is rotated on this study. The lung volumes are low as compared to the previous exam. The pulmonary interstitial markings are more prominent bilaterally. The heart borders are obscured. The cardiac silhouette may be mildly enlarged. Pleural fluid may be present on the left posteriorly. The endotracheal tube tip lies approximately 4 cm above the crotch of the carina. The esophagogastric tube tip and proximal port project below the level of the GE junction. The left subclavian venous catheter tip lies in the region of the proximal SVC.  IMPRESSION: The appearance of the lungs has markedly deteriorated since the previous study. Increased interstitial densities are present and there is bilateral hypoinflation. The findings may reflect interstitial edema or pneumonia. The support tubes and lines appear to be in appropriate position. A follow-up film with straight AP positioning would be of value.   Electronically Signed   By: David  Martinique   On: 11/27/2013 16:05   Ct Portable Head W/o Cm  11/27/2013   CLINICAL DATA:  Focal seizures. Hypotensive episode. Ectatic encephalopathy.  EXAM: CT HEAD WITHOUT CONTRAST  TECHNIQUE: Contiguous axial images were obtained from the base of the skull through the vertex without intravenous contrast.  COMPARISON:  11/26/2013 and 11/22/2013 CT.  FINDINGS: Within the limitations of portable CT imaging, findings raise the possibility of diffuse cerebral edema. Cerebellar tonsils may be minimally low lying.  No intracranial mass lesion noted on this unenhanced exam.  No intracranial hemorrhage.  No hydrocephalus.  IMPRESSION: Within the limitations of portable CT imaging, findings raise the  possibility of diffuse cerebral edema. Cerebellar tonsils may be minimally low lying.  These results were called by telephone at the time of interpretation on 11/27/2013 at 8:10 PM to Dr. Margarette Asal , who verbally acknowledged these results.   Electronically Signed   By: Chauncey Cruel M.D.   On: 11/27/2013 20:11   Ct Portable Head W/o Cm  11/26/2013   CLINICAL DATA:  Significant hypotensive event, possibly precipitated by sepsis and hepatic disease. Acute renal failure, acute respiratory failure, alcoholic cirrhosis, and possible hepatic encephalopathy. Low GCS reportedly 3-4.  EXAM: CT HEAD WITHOUT CONTRAST  TECHNIQUE: Contiguous axial images were obtained from the base of the skull through the vertex without contrast.  COMPARISON:  12/09/2013  FINDINGS: Study performed with the portable CT device. Image quality is reduced.  Compared with the prior CT from 12/02/2013, there is subtle developing diffuse cerebral edema. This can be observed on slices through the superior vermian cistern, sylvian fissures, suprasellar cistern, and cortical sulci, all of which reveal mild effacement of the CSF spaces. There is patchy, but not complete, loss of gray-white differentiation in several locations. No parenchymal hemorrhage or lobar infarction. No incipient herniation. Sinus air-fluid levels due to prolonged recumbency.  IMPRESSION: Suspect developing cerebral edema related to hypoxic ischemic insult. Early changes of cortical edema and mild diffuse brain swelling when compared with CT 11/20/2013. See discussion above.   Electronically Signed   By: Rolla Flatten M.D.   On: 11/26/2013 13:45   ASSESSMENT / PLAN:  PULMONARY A: Acute respiratory failure in setting of acute encephalopathy Respiratory acidosis, improved; metabolic acidosis  LLL (Possible aspiration) pneumonia Small L pleural effusion - not enough to tap  P:   Comfort care    CARDIOVASCULAR A:  Shock presumed due to sepsis + hepatic disease;  presented with small hematemesis but doubt hemorrhagic shock with stable serial Hb. TTE shows intact LV fxn.  P:  Comfort care, no further labs   RENAL A:   Acute anuric renal failure; ATN from shock state, consider possible hepatorenal syndrome. 4/10 beginning to have some UOP, S Cr plateau at 3.95 Hypokalemia  Severe AG metabolic acidosis due to ARF and lactate - worsening lactate 4/11 P:   Comfort care    GASTROINTESTINAL A:   Alcoholic cirrhosis Doubt significant GI hemorrhage (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 and no ulcers / varices), Hgb stable Nutrition  P:   Comfort care  HEMATOLOGIC A:  Coagulopathy in setting of cirrhosis Progressive Leukocytosis 4/9-10 P:  Comfort care  INFECTIOUS A:   LLL PNA (but no secretions) with small L effusion and small ascites ?aspiration PNA  Enterococcal UTI Progressive leukocytosis; CT scan abd and pelvis 4/9 > no evidence bowel injury, perf, etc P:   Comfort care  ENDOCRINE A:   At risk hypoglycemia Intact adrenal function > random cort 54 P: Comfort care  NEUROLOGIC A:   Acute encephalopathy, suspect hepatic encephalopathy exacerbated by sepsis - developing cerebral edema Hx EtOH abuse, last was 07/2013 Ammonia 157 4/11 P:   Comfort care morphine gtt  Versed gtt     GLOBAL Critically ill, prognosis poor due to underlying liver disease. Discussed with husband and children at bedside 4/9 and 4/10. They understand that we are supporting fully. They want all aggressive measures that have a chance to reverse acute process but also understand that if she progresses to cardiac arrest that she has little chance for meaningful survival. We agreed that she should be limited code, no CPR or defib. All other measures are appropriate. They do not want to prolong her support, but are willing to continue this course AS LONG AS there are trends of improvement.    4/11 - worsening overall - need to discuss further with  family and consider transition to comfort.  4/12- comfort care -family on board    Marijean Heath, NP 12/10/2013  8:49 AM Pager: [phone] or [phone]  Care during the described time interval was provided by me and/or other providers on the critical care team.  I have reviewed this patient's available data, including  medical history, events of note, physical examination and test results as part of my evaluation  CC time x 97m  Kara Mead MD. FCCP. [facility] Pager [phone] If no response call [phone]

## 2013-12-17 NOTE — Progress Notes (Signed)
2330 Family requested to withdraw care on pt. 0120 started Morphine and Versed drip per MD order. RT extubated pt at 0135. Comfort care initiated. Family at bedside.

## 2013-12-17 NOTE — Progress Notes (Signed)
Pt extubated to room air per MD order,as per family request of withdrawal of care.

## 2013-12-17 NOTE — Progress Notes (Signed)
Comfort care measures were initiated during previous shift. At approximately 12:00 pm, patient's vital signs were noted to begin deteriorating. Asystole was noted on the monitor at 12:18 pm. At this time heart and lung sounds were independently auscultated by Caprice Beaver, RN and myself. No heart and lung sounds were heard. Time of death was 12:18 pm. CDS, the medical examiner, and the physician were all notified with time of death. The medical condition experienced by the patient was incompatible with life. Comfort care was provided to the family. Family questions were answered and they denied any further assistance from nursing at this time. The patient's body was released to the funeral home per hospital protocol.

## 2013-12-17 NOTE — Progress Notes (Signed)
[facility] Progress Note Patient Name: Elizabeth Orr DOB: Sep 15, 1960 MRN: [id number]  Date of Service  12/10/2013   HPI/Events of Note   Per RN - family requests withdrawal of care to comfort  eICU Interventions   Discussed with husband, confirmed Orders placed     Intervention Category Major Interventions: Other:  Acxel Dingee , 12:12 AM

## 2013-12-17 DEATH — deceased

## 2013-12-20 ENCOUNTER — Other Ambulatory Visit (HOSPITAL_COMMUNITY): Payer: Medicaid Other

## 2014-01-27 ENCOUNTER — Other Ambulatory Visit (HOSPITAL_COMMUNITY): Payer: Medicaid Other

## 2014-01-27 ENCOUNTER — Ambulatory Visit (HOSPITAL_COMMUNITY): Payer: Medicaid Other

## 2014-04-15 ENCOUNTER — Other Ambulatory Visit: Payer: Self-pay | Admitting: Pharmacist

## 2014-05-25 IMAGING — CR DG FOOT COMPLETE 3+V*L*
3 series · 3 of 3 positions shown · non-contrast
Comparison: None.

CLINICAL DATA: Left foot pain

EXAM:
LEFT FOOT - COMPLETE 3+ VIEW

[ap]
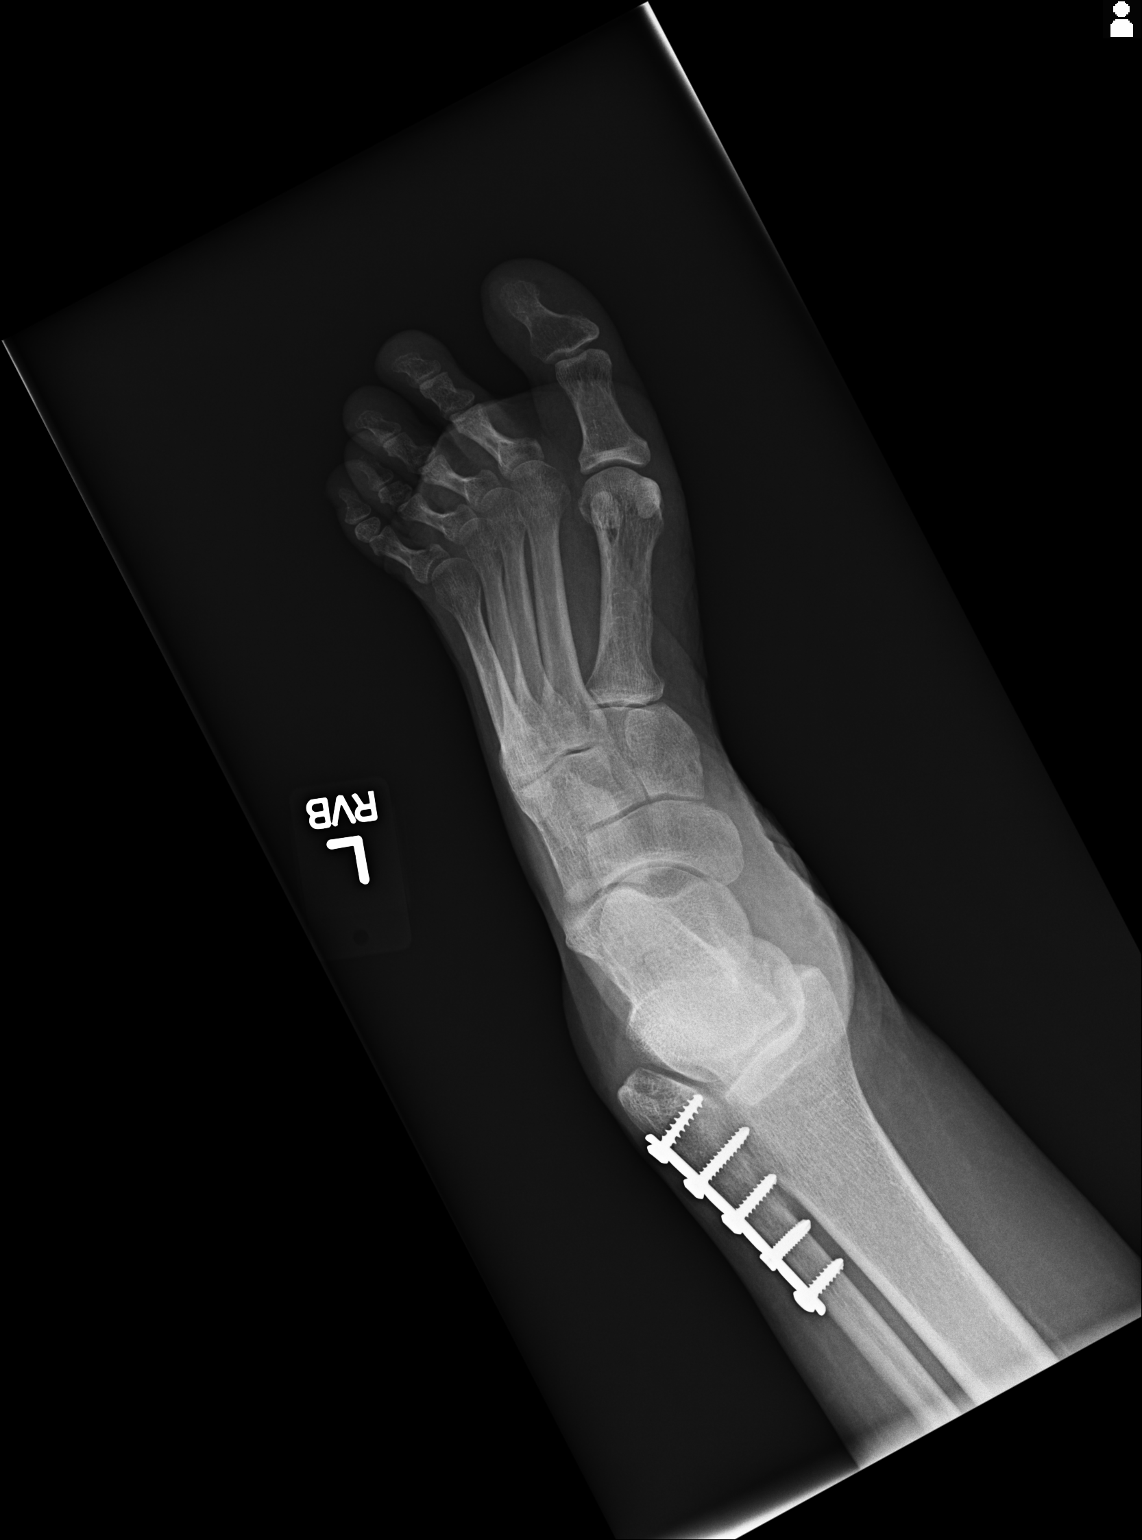

[oblique]
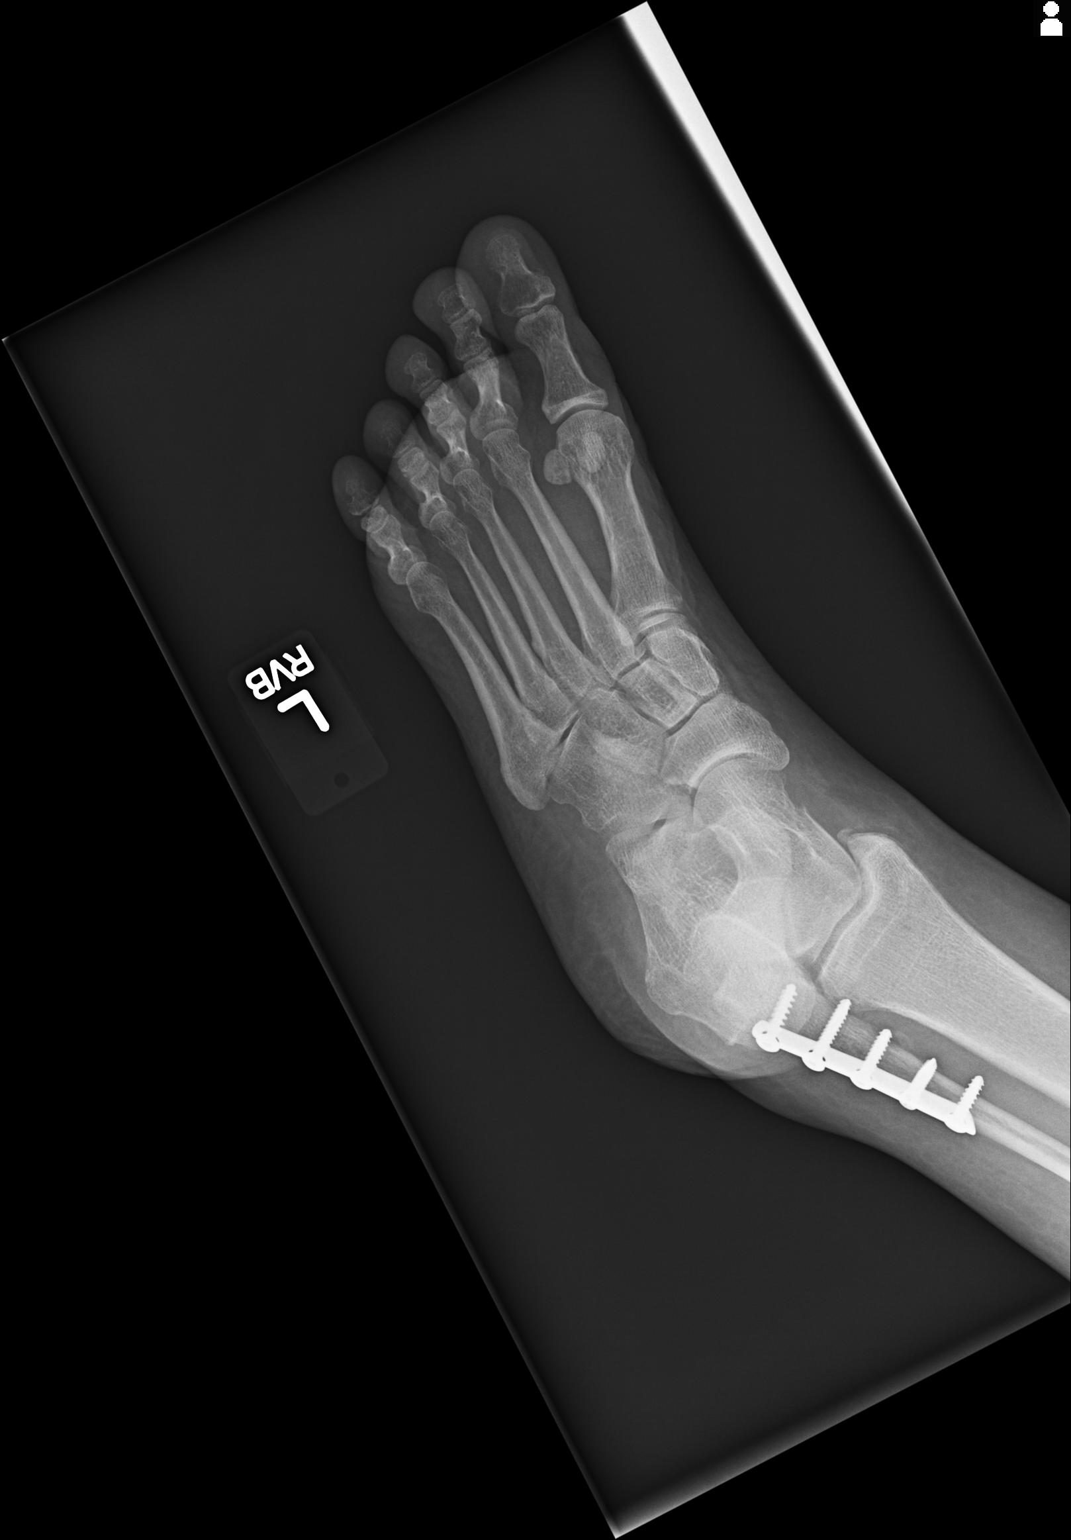

[lat]
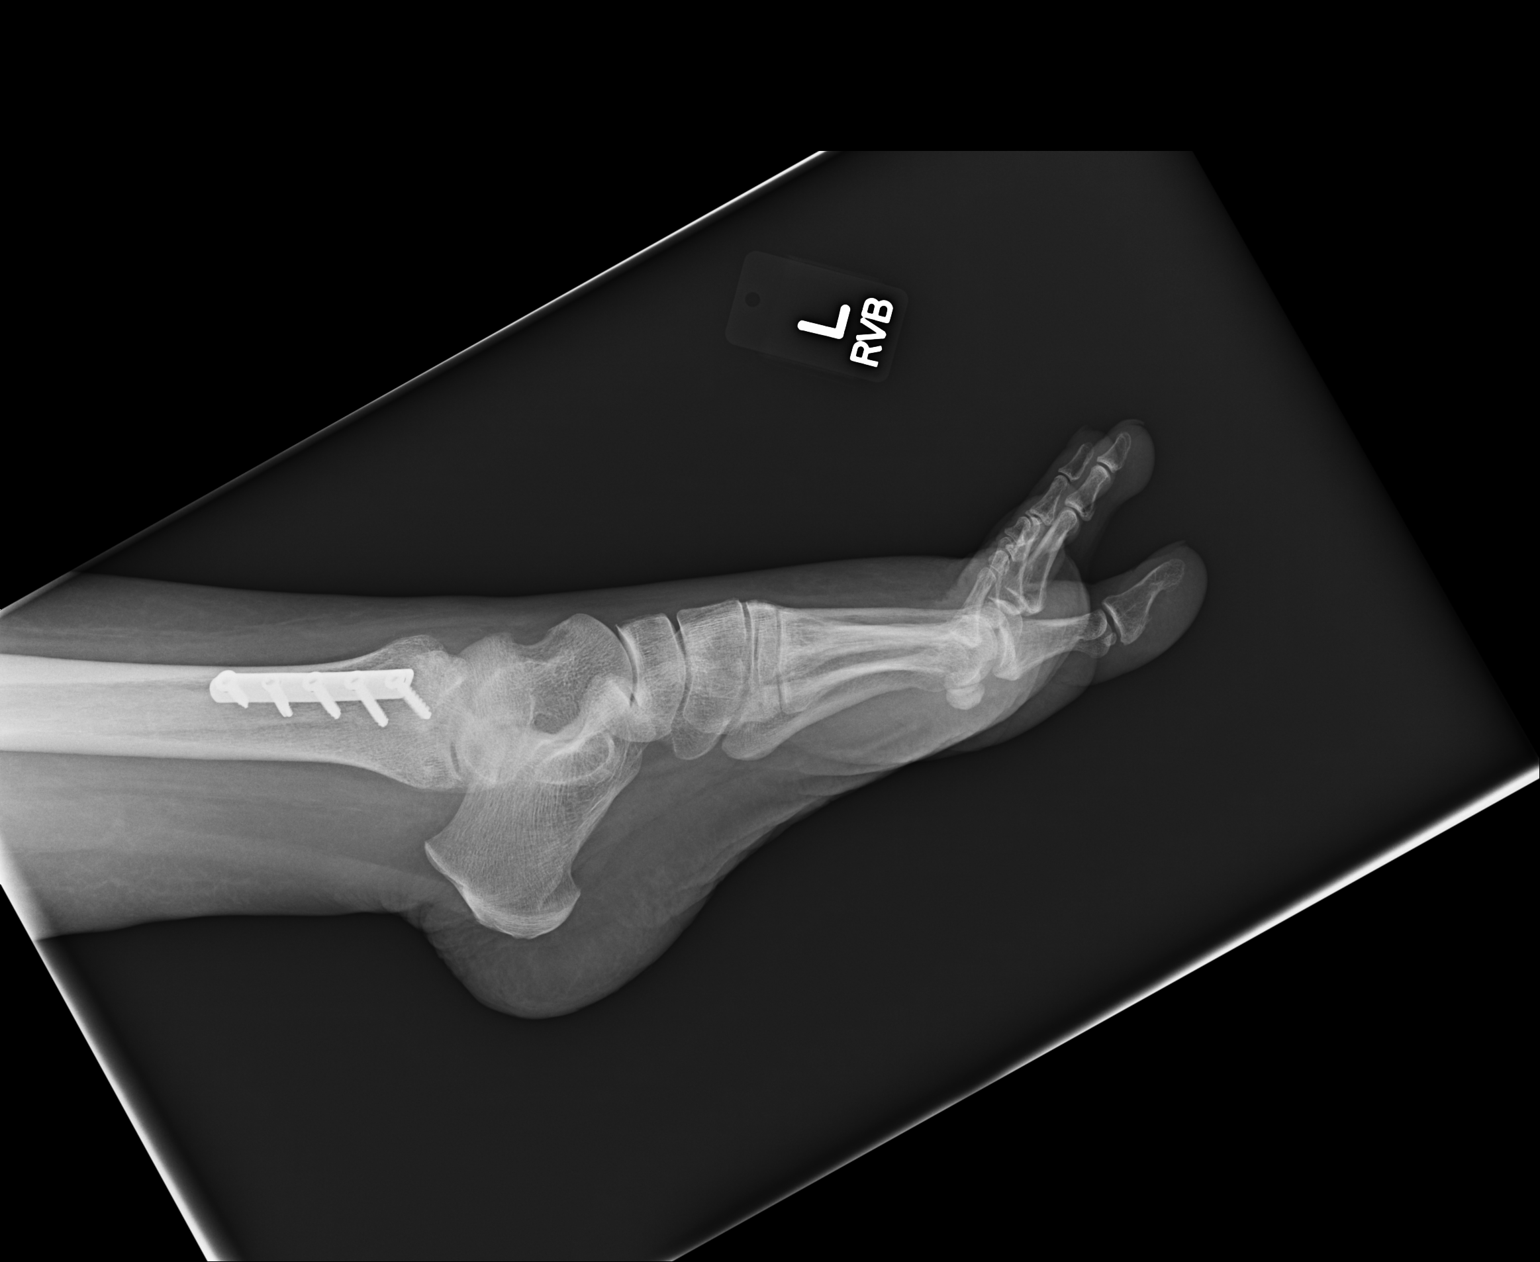

[3 of 3 positions shown; findings below may reference images not displayed]

FINDINGS: Postsurgical changes are noted in the distal fibula. No acute bony
abnormality is noted.
IMPRESSION: No acute abnormality seen.

## 2014-05-25 IMAGING — CR DG KNEE COMPLETE 4+V*R*
4 series · 4 of 4 positions shown · non-contrast
Comparison: None.

CLINICAL DATA: Leg pain

EXAM:
RIGHT KNEE - COMPLETE 4+ VIEW

[ap]
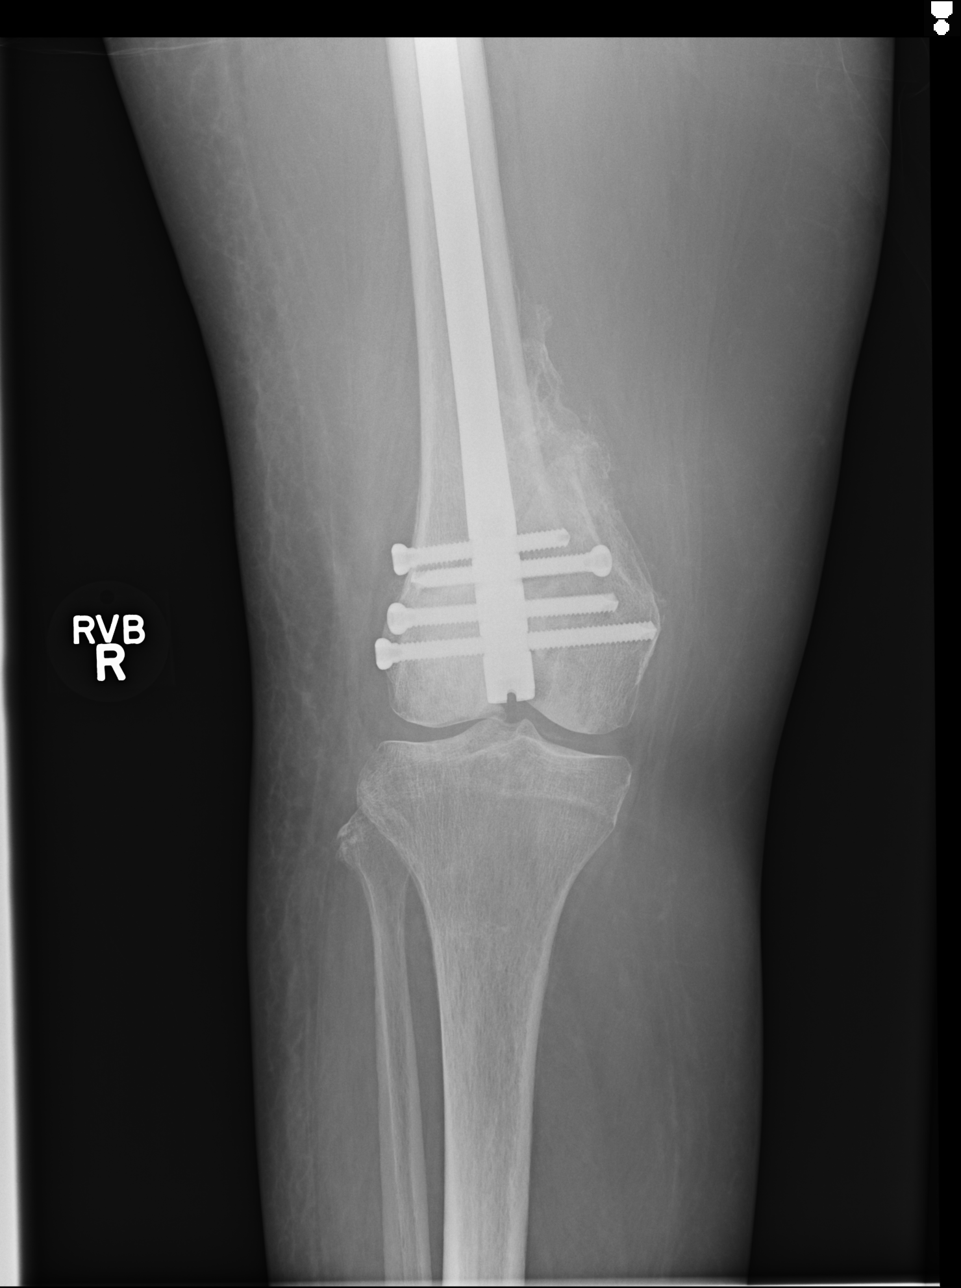

[oblique (1 of 2)]
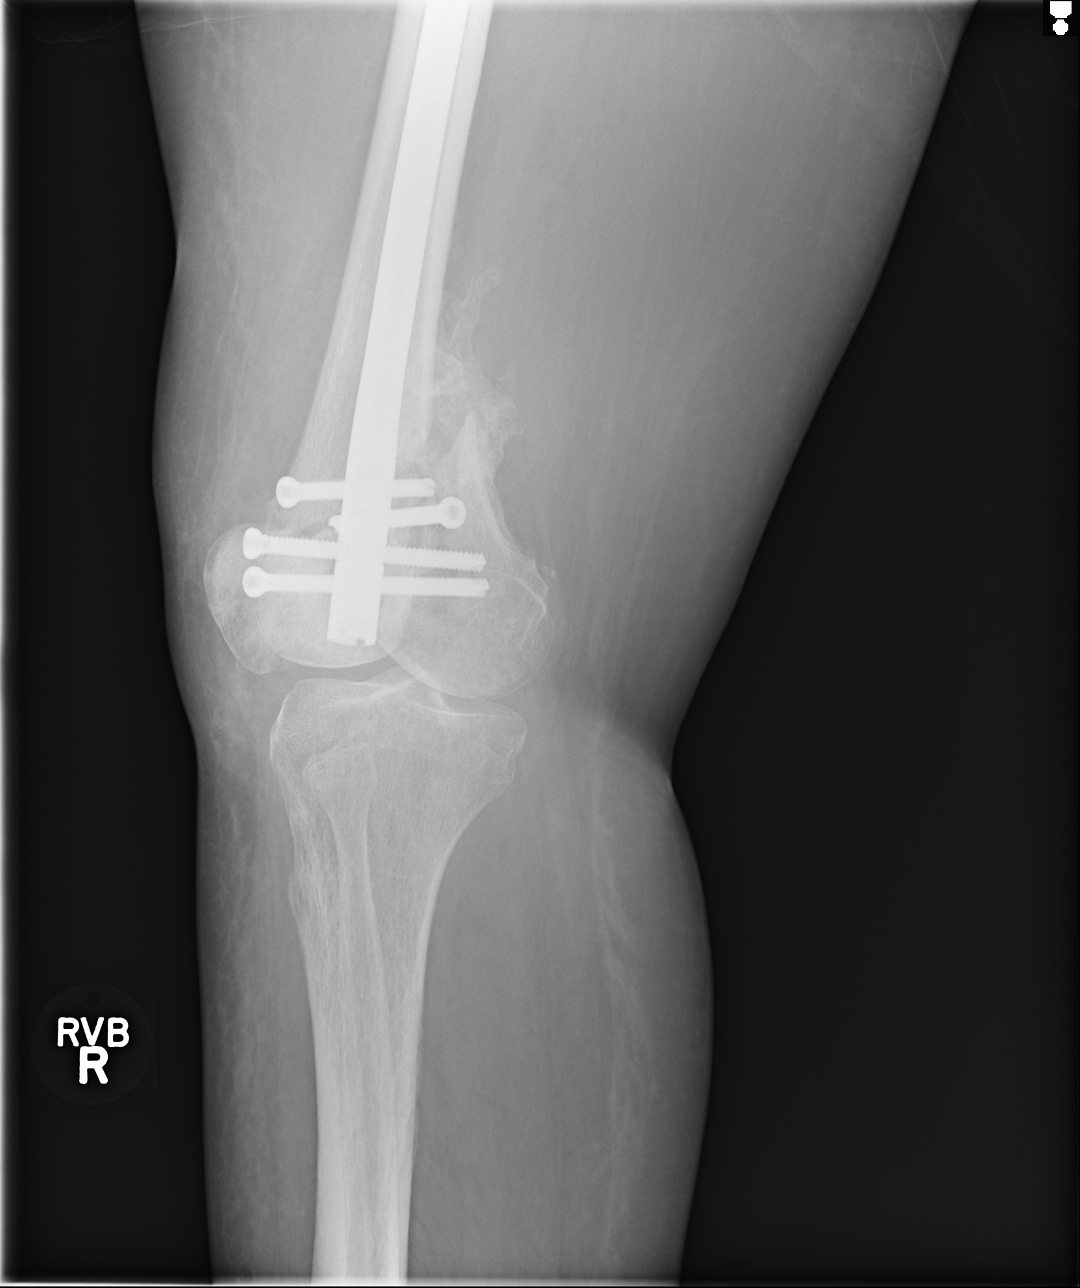

[oblique (2 of 2)]
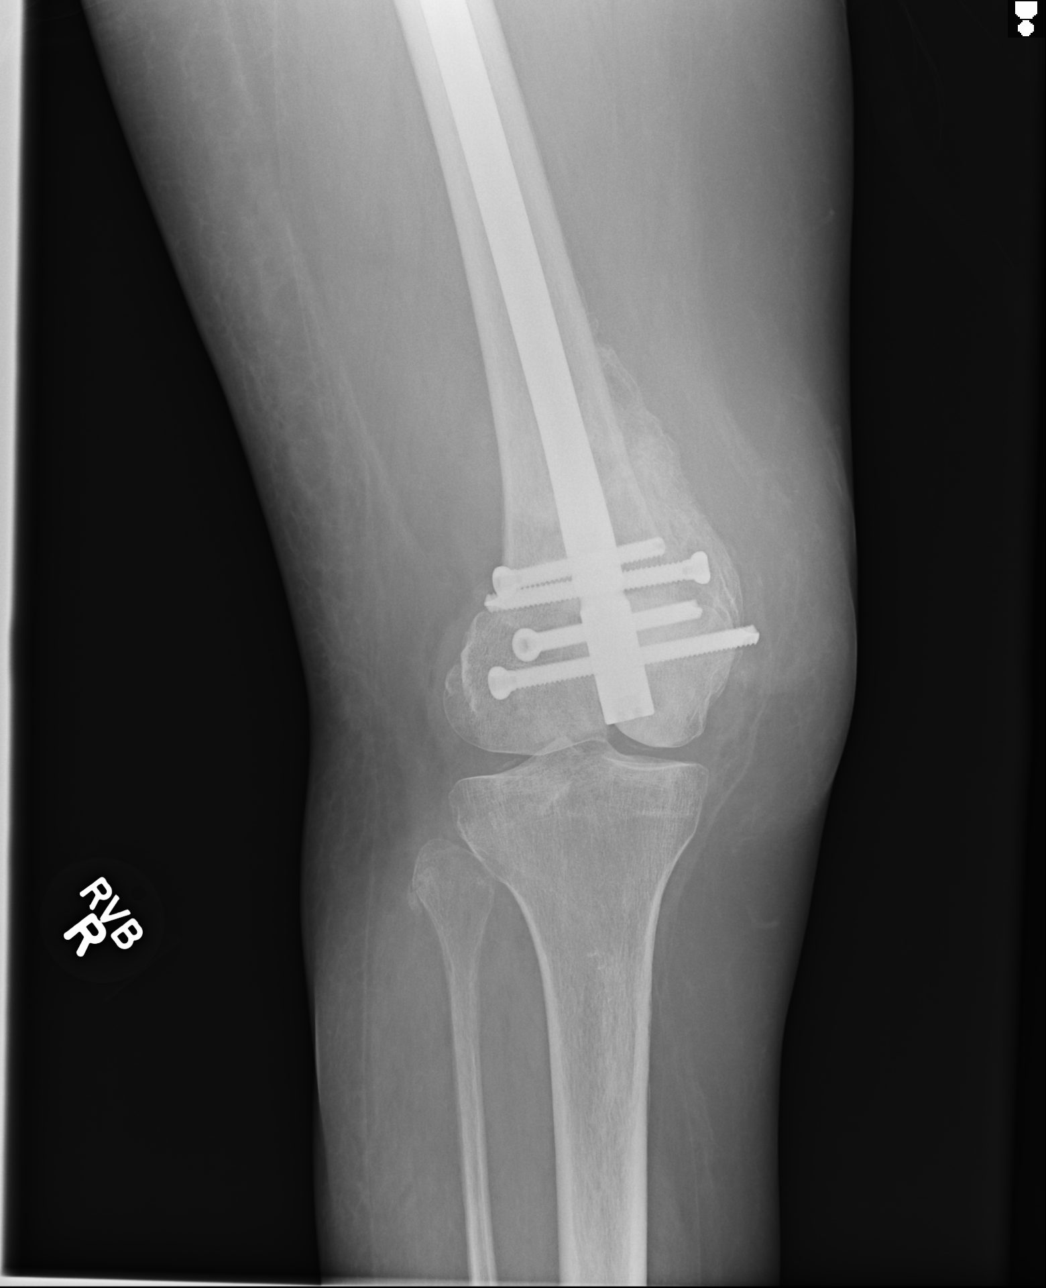

[lat]
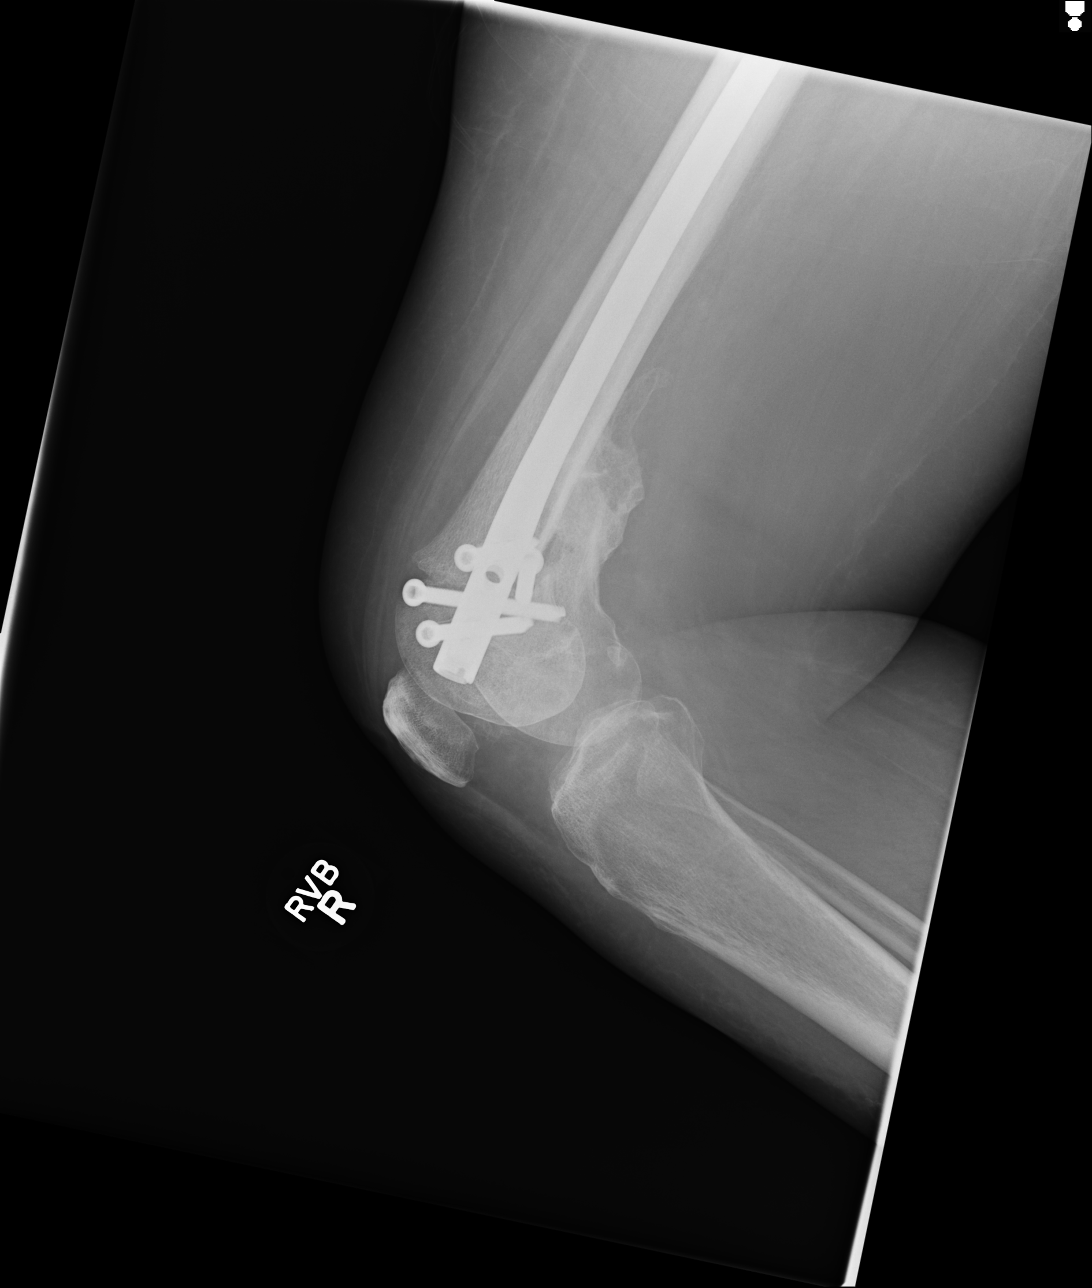

[4 of 4 positions shown; findings below may reference images not displayed]

FINDINGS: There are changes consistent with prior medullary rod placement.
There is an oblique fracture identified through the distal
metaphysis extending inferiorly around the orthopedic hardware. It
has a different appearance than that seen on the prior exam and may
represent an acute fracture. Some callus formation is noted
IMPRESSION: Changes consistent with prior surgical fixation. There are changes
suggestive of an acute fracture in the distal femoral metaphysis.
Correlation with any recent trauma is recommended.

## 2014-05-25 IMAGING — CT CT ABD-PELV W/ CM
2 of 4 series · 17 of 46 positions shown, 19 images · IV contrast (omnipaque)
Comparison: CT scan dated 10/08/2013

CLINICAL DATA: Diffuse abdominal pain. Cirrhosis. History of breast
cancer.

EXAM:
CT ABDOMEN AND PELVIS WITH CONTRAST
TECHNIQUE: Multidetector CT imaging of the abdomen and pelvis was performed
using the standard protocol following bolus administration of
intravenous contrast.
CONTRAST:  100mL OMNIPAQUE IOHEXOL 300 MG/ML SOLN, 50mL OMNIPAQUE
IOHEXOL 300 MG/ML SOLN

[Series 2: abd_pel_with 5.0 b40f · axial · 0.84mm/px · z∈[-476,-46]mm · 14 of 96 slices shown, 16 images]
[im 5/96  soft-tissue]
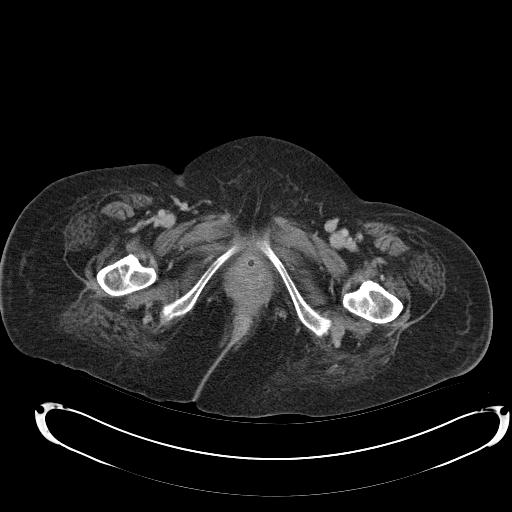
[im 5/96  bone]
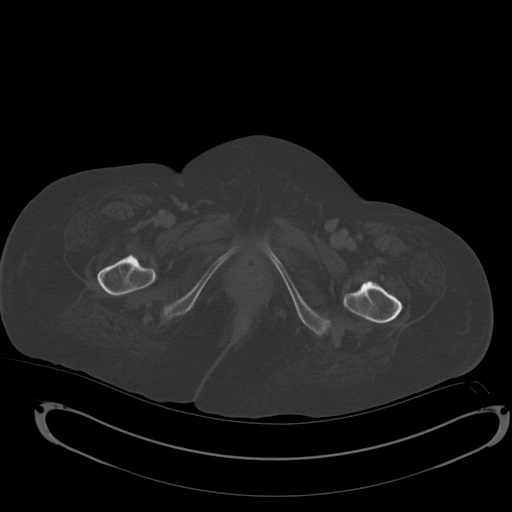
[im 13/96  soft-tissue]
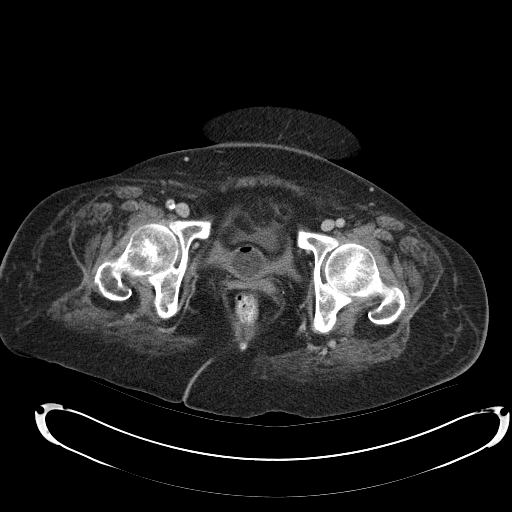
[im 18/96  soft-tissue]
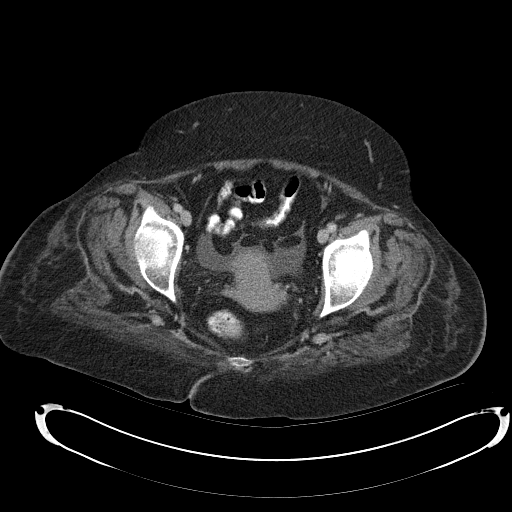
[im 26/96  soft-tissue]
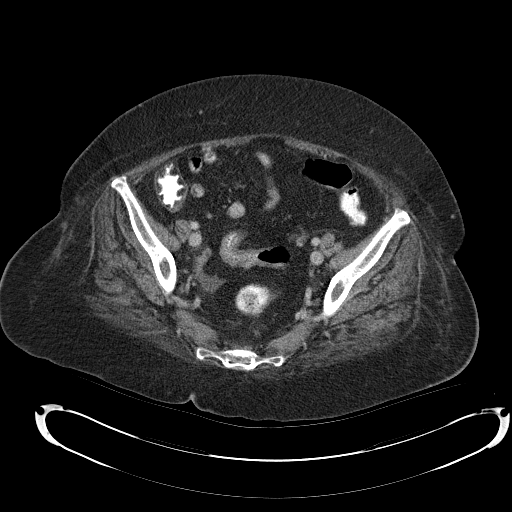
[im 31/96  soft-tissue]
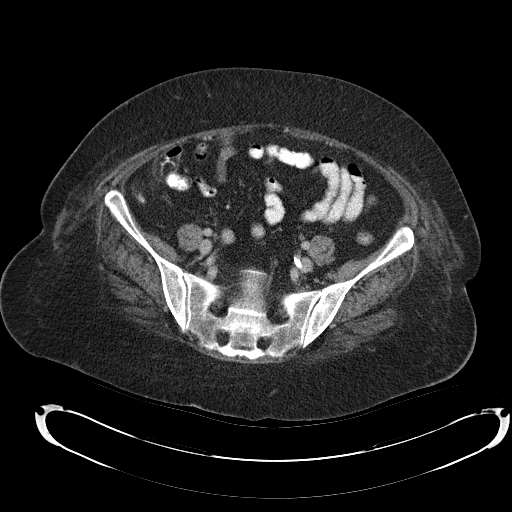
[im 39/96  soft-tissue]
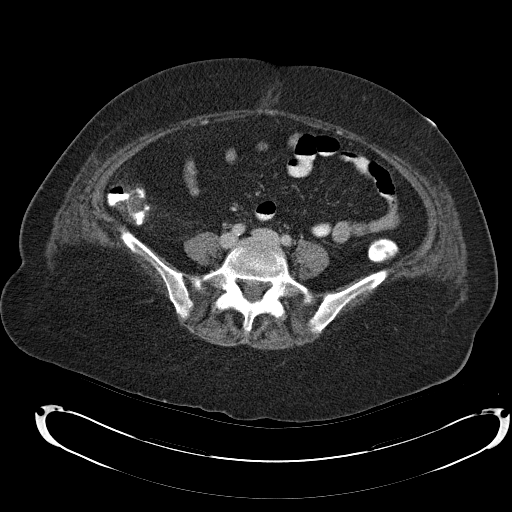
[im 44/96  soft-tissue]
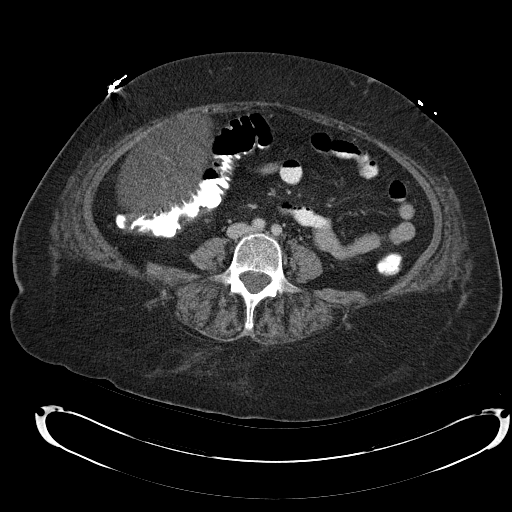
[im 52/96  soft-tissue]
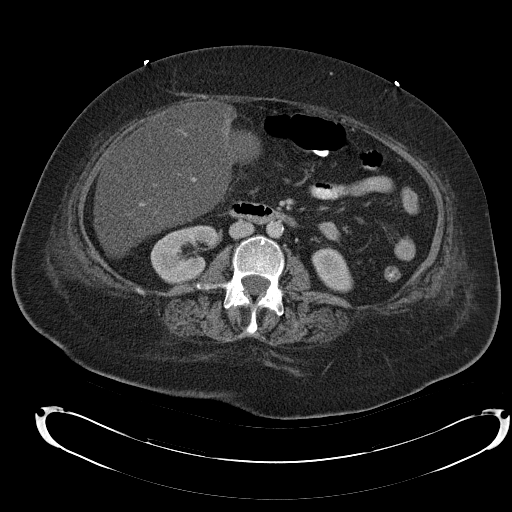
[im 57/96  soft-tissue]
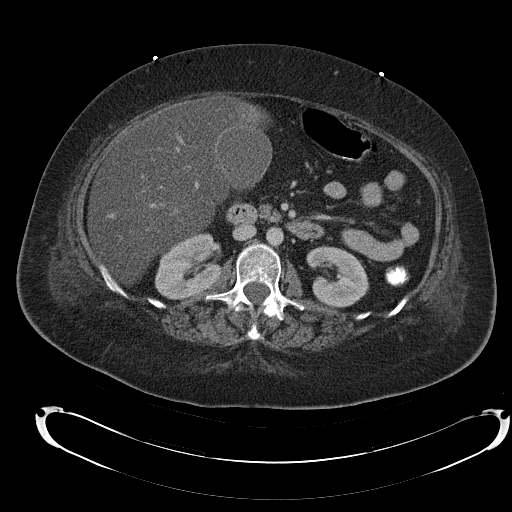
[im 57/96  bone]
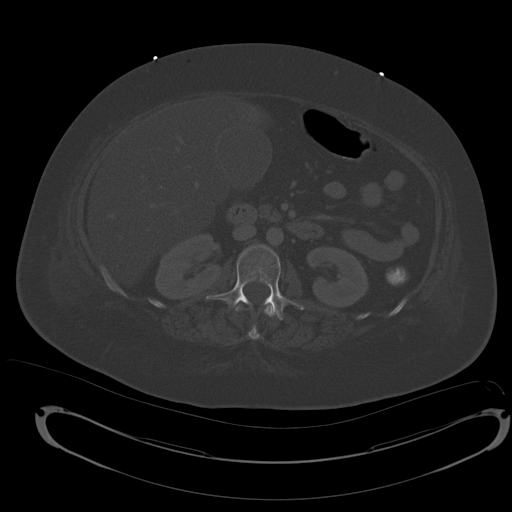
[im 65/96  soft-tissue]
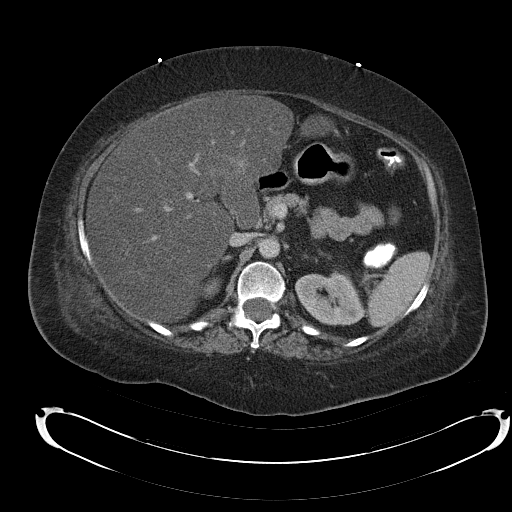
[im 70/96  soft-tissue]
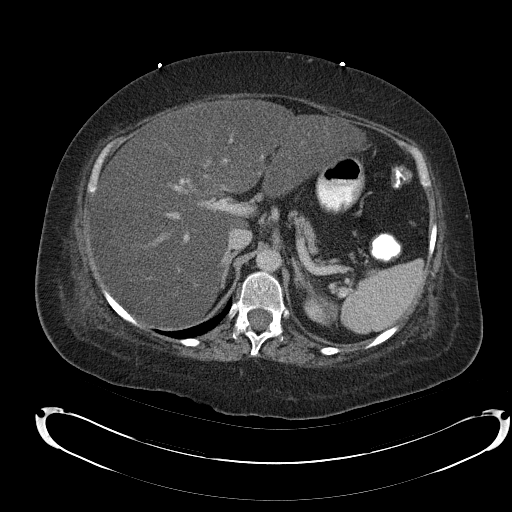
[im 78/96  soft-tissue]
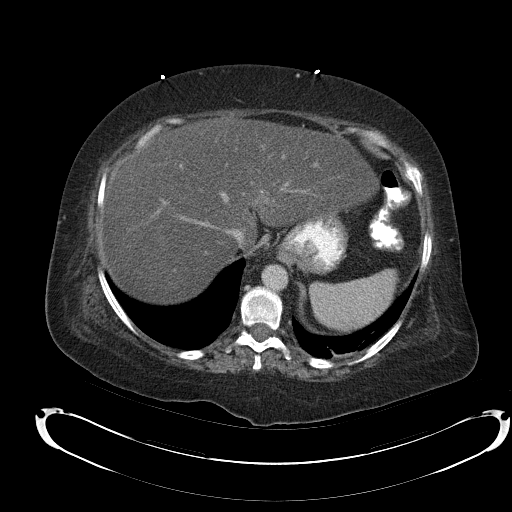
[im 83/96  soft-tissue]
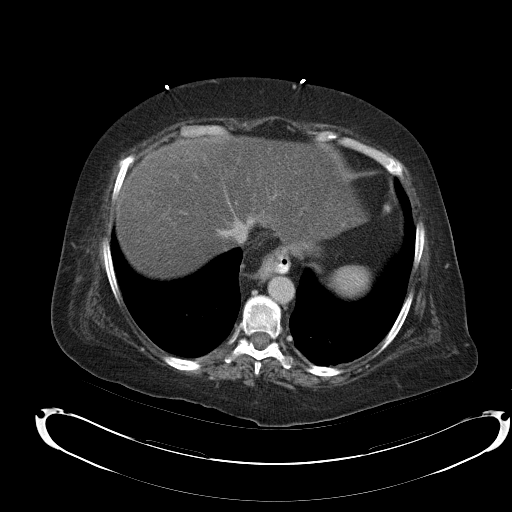
[im 91/96  soft-tissue]
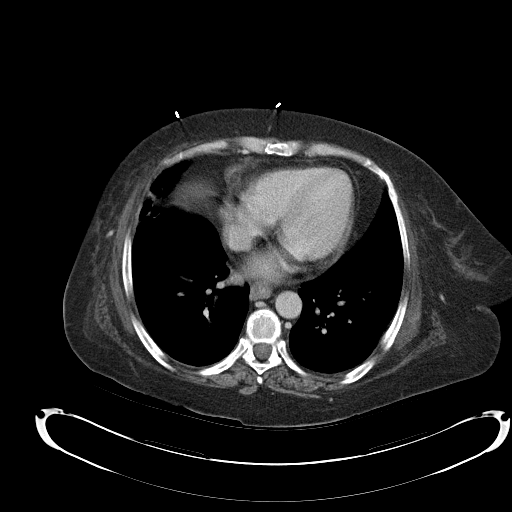

[Series 4: abd_pel_with 3.0 spo cor · coronal · 0.96mm/px · 3 of 98 slices shown]
[im 33/98  soft-tissue]
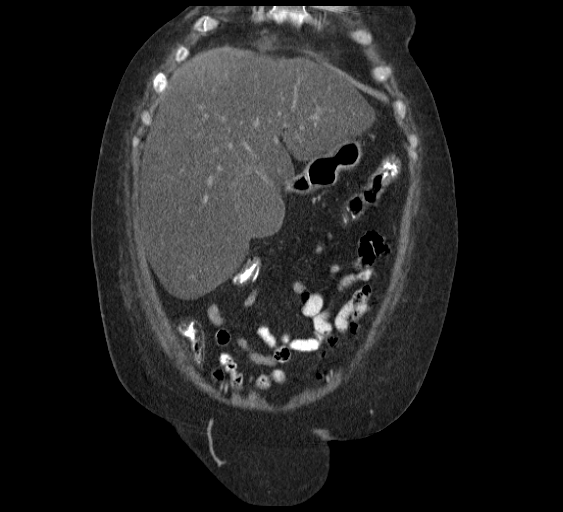
[im 44/98  soft-tissue]
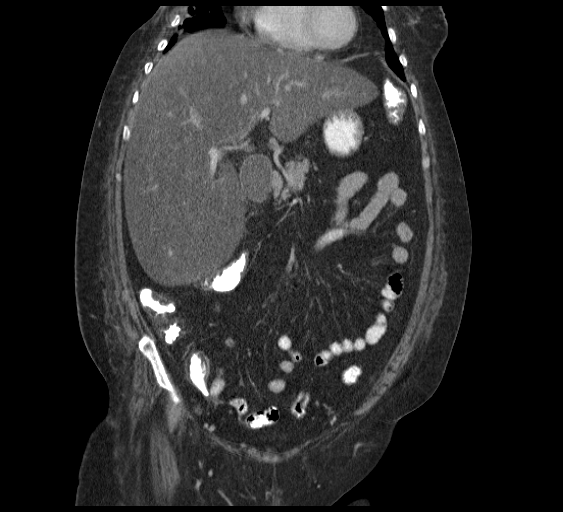
[im 54/98  soft-tissue]
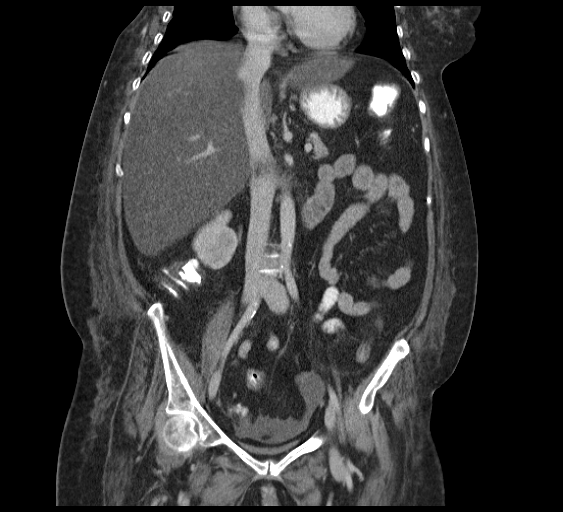

[17 of 46 positions shown; findings below may reference images not displayed]

FINDINGS: There is hepatomegaly with diffuse hepatic steatosis. Biliary tree,
spleen, pancreas, adrenal glands, and kidneys are normal except for
tiny cysts in both kidneys.

Bowel appears normal. There is a small amount of ascites in the
pelvis which is new. There is a stable 2.7 cm cyst on the left
ovary. No osseous abnormality. Lung bases are clear.
IMPRESSION: 1. New small amount of ascites in the pelvis since the prior study.
2. Chronic hepatomegaly with hepatic steatosis.

## 2014-06-20 ENCOUNTER — Encounter (HOSPITAL_COMMUNITY): Payer: Self-pay | Admitting: Emergency Medicine

## 2014-11-04 ENCOUNTER — Other Ambulatory Visit (HOSPITAL_COMMUNITY): Payer: Self-pay | Admitting: Oncology
